# Patient Record
Sex: Female | Born: 1935 | Race: White | Hispanic: No | Smoking: Former smoker
Health system: Southern US, Community
[De-identification: ages and names within clinical notes are randomized; demographics above are authoritative.]

## PROBLEM LIST (undated history)

## (undated) DIAGNOSIS — I10 Essential (primary) hypertension: Secondary | ICD-10-CM

## (undated) DIAGNOSIS — F419 Anxiety disorder, unspecified: Secondary | ICD-10-CM

## (undated) DIAGNOSIS — K219 Gastro-esophageal reflux disease without esophagitis: Secondary | ICD-10-CM

## (undated) DIAGNOSIS — M1990 Unspecified osteoarthritis, unspecified site: Secondary | ICD-10-CM

## (undated) DIAGNOSIS — K529 Noninfective gastroenteritis and colitis, unspecified: Secondary | ICD-10-CM

## (undated) DIAGNOSIS — N390 Urinary tract infection, site not specified: Secondary | ICD-10-CM

## (undated) DIAGNOSIS — R51 Headache: Secondary | ICD-10-CM

## (undated) DIAGNOSIS — T8859XA Other complications of anesthesia, initial encounter: Secondary | ICD-10-CM

## (undated) DIAGNOSIS — T4145XA Adverse effect of unspecified anesthetic, initial encounter: Secondary | ICD-10-CM

## (undated) HISTORY — PX: LASIK: SHX215

## (undated) HISTORY — PX: CATARACT EXTRACTION, BILATERAL: SHX1313

---

## 2001-01-31 ENCOUNTER — Emergency Department (HOSPITAL_COMMUNITY): Admission: EM | Admit: 2001-01-31 | Discharge: 2001-01-31 | Payer: Self-pay | Admitting: Emergency Medicine

## 2001-03-27 ENCOUNTER — Ambulatory Visit (HOSPITAL_COMMUNITY): Admission: RE | Admit: 2001-03-27 | Discharge: 2001-03-27 | Payer: Self-pay | Admitting: Gastroenterology

## 2001-10-20 ENCOUNTER — Other Ambulatory Visit: Admission: RE | Admit: 2001-10-20 | Discharge: 2001-10-20 | Payer: Self-pay | Admitting: Internal Medicine

## 2001-11-24 ENCOUNTER — Encounter: Admission: RE | Admit: 2001-11-24 | Discharge: 2001-11-24 | Payer: Self-pay | Admitting: Internal Medicine

## 2001-11-24 ENCOUNTER — Encounter: Payer: Self-pay | Admitting: Internal Medicine

## 2001-11-28 ENCOUNTER — Encounter: Payer: Self-pay | Admitting: Internal Medicine

## 2001-11-28 ENCOUNTER — Encounter: Admission: RE | Admit: 2001-11-28 | Discharge: 2001-11-28 | Payer: Self-pay | Admitting: Internal Medicine

## 2003-12-06 ENCOUNTER — Encounter: Admission: RE | Admit: 2003-12-06 | Discharge: 2003-12-06 | Payer: Self-pay | Admitting: Family Medicine

## 2005-01-26 ENCOUNTER — Encounter: Admission: RE | Admit: 2005-01-26 | Discharge: 2005-01-26 | Payer: Self-pay | Admitting: Internal Medicine

## 2006-03-19 ENCOUNTER — Encounter: Admission: RE | Admit: 2006-03-19 | Discharge: 2006-03-19 | Payer: Self-pay | Admitting: Internal Medicine

## 2007-04-09 ENCOUNTER — Encounter: Admission: RE | Admit: 2007-04-09 | Discharge: 2007-04-09 | Payer: Self-pay | Admitting: Internal Medicine

## 2009-02-02 ENCOUNTER — Encounter: Admission: RE | Admit: 2009-02-02 | Discharge: 2009-02-02 | Payer: Self-pay | Admitting: Family Medicine

## 2010-02-20 ENCOUNTER — Encounter: Admission: RE | Admit: 2010-02-20 | Discharge: 2010-02-20 | Payer: Self-pay | Admitting: Family Medicine

## 2010-08-01 ENCOUNTER — Other Ambulatory Visit: Admission: RE | Admit: 2010-08-01 | Discharge: 2010-08-01 | Payer: Self-pay | Admitting: Family Medicine

## 2011-03-06 ENCOUNTER — Other Ambulatory Visit: Payer: Self-pay | Admitting: Family Medicine

## 2011-03-06 DIAGNOSIS — Z1231 Encounter for screening mammogram for malignant neoplasm of breast: Secondary | ICD-10-CM

## 2011-03-08 ENCOUNTER — Ambulatory Visit
Admission: RE | Admit: 2011-03-08 | Discharge: 2011-03-08 | Disposition: A | Discharge status: Home / Self Care | Payer: Medicare Other | Source: Ambulatory Visit | Attending: Family Medicine | Admitting: Family Medicine

## 2011-03-08 DIAGNOSIS — Z1231 Encounter for screening mammogram for malignant neoplasm of breast: Secondary | ICD-10-CM

## 2011-04-13 NOTE — Procedures (Signed)
[place]  Patient:    Carol [name], Carol [name]                    MRN: [id number] Proc. Date: 03/27/01 Adm. Date:  [id number] Disc. Date: [id number] Attending:  Charna Elizabeth CC:         Kern Reap, M.D.   Procedure Report  DATE OF BIRTH:  [birth year].  PROCEDURE:  Colonoscopy with biopsies.  ENDOSCOPIST:  Anselmo Rod, M.D.  INSTRUMENT USED:  Olympus video colonoscope.  INDICATION FOR PROCEDURE:  [name] 75 year old white female with [name] history of rectal bleeding and [name] family history of colon cancer in [name] sister.  Rule out colonic polyps, masses, hemorrhoids, etc.  PREPROCEDURE PREPARATION:  Informed consent was procured from the patient. The patient was fasted for eight hours prior to the procedure and prepped with [name] bottle of magnesium citrate and [name] gallon of NuLytely the night prior to the procedure.  PREPROCEDURE PHYSICAL:  VITAL SIGNS:  The patient had stable vital signs.  NECK:  Supple.  CHEST:  Clear to auscultation.  S1, S2 regular.  ABDOMEN:  Soft with normal abdominal bowel sounds.  DESCRIPTION OF PROCEDURE:  The patient was placed in the left lateral decubitus position and sedated with 70 mg of Demerol and 6 mg of Versed intravenously.  Once the patient was adequately sedate and maintained on low-flow oxygen and continuous cardiac monitoring, the Olympus video colonoscope was advanced from the rectum to the cecum without difficulty. There were [name] few scattered diverticula throughout the colon.  [name] small sessile polyp was biopsied from the mid-right colon by regular biopsy forceps.  Small external hemorrhoid was appreciated on anal inspection.  No other abnormalities were noted.  The appendiceal orifice and the ileocecal valve were clearly visualized and appeared healthy.  IMPRESSION: 1. Small external hemorrhoid. 2. Few scattered diverticula with more prominent diverticulosis in the right    colon. 3. Small polyp  biopsied from mid-right colon.  RECOMMENDATIONS: 1. Await pathology results. 2. Repeat colorectal cancer screening in the next five years. 3. Increase fluid and fiber in the diet. 4. Outpatient follow-up on [name] p.r.n. basis. DD:  03/27/01 TD:  03/29/01 Job: [id number]

## 2011-12-05 DIAGNOSIS — H269 Unspecified cataract: Secondary | ICD-10-CM | POA: Diagnosis not present

## 2011-12-05 DIAGNOSIS — IMO0002 Reserved for concepts with insufficient information to code with codable children: Secondary | ICD-10-CM | POA: Diagnosis not present

## 2011-12-05 DIAGNOSIS — H2510 Age-related nuclear cataract, unspecified eye: Secondary | ICD-10-CM | POA: Diagnosis not present

## 2012-01-30 DIAGNOSIS — M949 Disorder of cartilage, unspecified: Secondary | ICD-10-CM | POA: Diagnosis not present

## 2012-01-30 DIAGNOSIS — K219 Gastro-esophageal reflux disease without esophagitis: Secondary | ICD-10-CM | POA: Diagnosis not present

## 2012-01-30 DIAGNOSIS — E785 Hyperlipidemia, unspecified: Secondary | ICD-10-CM | POA: Diagnosis not present

## 2012-01-30 DIAGNOSIS — F411 Generalized anxiety disorder: Secondary | ICD-10-CM | POA: Diagnosis not present

## 2012-01-30 DIAGNOSIS — I10 Essential (primary) hypertension: Secondary | ICD-10-CM | POA: Diagnosis not present

## 2012-01-30 DIAGNOSIS — M899 Disorder of bone, unspecified: Secondary | ICD-10-CM | POA: Diagnosis not present

## 2012-01-30 DIAGNOSIS — F329 Major depressive disorder, single episode, unspecified: Secondary | ICD-10-CM | POA: Diagnosis not present

## 2012-02-13 ENCOUNTER — Other Ambulatory Visit: Payer: Self-pay | Admitting: Family Medicine

## 2012-02-13 DIAGNOSIS — Z1231 Encounter for screening mammogram for malignant neoplasm of breast: Secondary | ICD-10-CM

## 2012-03-10 ENCOUNTER — Ambulatory Visit
Admission: RE | Admit: 2012-03-10 | Discharge: 2012-03-10 | Disposition: A | Discharge status: Home / Self Care | Payer: Medicare Other | Source: Ambulatory Visit | Attending: Family Medicine | Admitting: Family Medicine

## 2012-03-10 DIAGNOSIS — Z1231 Encounter for screening mammogram for malignant neoplasm of breast: Secondary | ICD-10-CM | POA: Diagnosis not present

## 2012-08-06 DIAGNOSIS — F411 Generalized anxiety disorder: Secondary | ICD-10-CM | POA: Diagnosis not present

## 2012-08-06 DIAGNOSIS — M899 Disorder of bone, unspecified: Secondary | ICD-10-CM | POA: Diagnosis not present

## 2012-08-06 DIAGNOSIS — M949 Disorder of cartilage, unspecified: Secondary | ICD-10-CM | POA: Diagnosis not present

## 2012-08-06 DIAGNOSIS — Z23 Encounter for immunization: Secondary | ICD-10-CM | POA: Diagnosis not present

## 2012-08-06 DIAGNOSIS — E785 Hyperlipidemia, unspecified: Secondary | ICD-10-CM | POA: Diagnosis not present

## 2012-08-06 DIAGNOSIS — K219 Gastro-esophageal reflux disease without esophagitis: Secondary | ICD-10-CM | POA: Diagnosis not present

## 2012-08-06 DIAGNOSIS — F329 Major depressive disorder, single episode, unspecified: Secondary | ICD-10-CM | POA: Diagnosis not present

## 2012-08-06 DIAGNOSIS — I10 Essential (primary) hypertension: Secondary | ICD-10-CM | POA: Diagnosis not present

## 2012-09-29 DIAGNOSIS — M949 Disorder of cartilage, unspecified: Secondary | ICD-10-CM | POA: Diagnosis not present

## 2012-09-29 DIAGNOSIS — M899 Disorder of bone, unspecified: Secondary | ICD-10-CM | POA: Diagnosis not present

## 2012-11-07 DIAGNOSIS — H43819 Vitreous degeneration, unspecified eye: Secondary | ICD-10-CM | POA: Diagnosis not present

## 2012-11-07 DIAGNOSIS — Z961 Presence of intraocular lens: Secondary | ICD-10-CM | POA: Diagnosis not present

## 2012-11-07 DIAGNOSIS — H5200 Hypermetropia, unspecified eye: Secondary | ICD-10-CM | POA: Diagnosis not present

## 2012-11-07 DIAGNOSIS — H52229 Regular astigmatism, unspecified eye: Secondary | ICD-10-CM | POA: Diagnosis not present

## 2013-02-04 DIAGNOSIS — I10 Essential (primary) hypertension: Secondary | ICD-10-CM | POA: Diagnosis not present

## 2013-02-04 DIAGNOSIS — K219 Gastro-esophageal reflux disease without esophagitis: Secondary | ICD-10-CM | POA: Diagnosis not present

## 2013-02-04 DIAGNOSIS — K5732 Diverticulitis of large intestine without perforation or abscess without bleeding: Secondary | ICD-10-CM | POA: Diagnosis not present

## 2013-02-04 DIAGNOSIS — E785 Hyperlipidemia, unspecified: Secondary | ICD-10-CM | POA: Diagnosis not present

## 2013-02-04 DIAGNOSIS — F411 Generalized anxiety disorder: Secondary | ICD-10-CM | POA: Diagnosis not present

## 2013-02-11 DIAGNOSIS — R1032 Left lower quadrant pain: Secondary | ICD-10-CM | POA: Diagnosis not present

## 2013-03-18 ENCOUNTER — Other Ambulatory Visit: Payer: Self-pay

## 2013-03-18 DIAGNOSIS — Z1231 Encounter for screening mammogram for malignant neoplasm of breast: Secondary | ICD-10-CM

## 2013-04-16 ENCOUNTER — Ambulatory Visit
Admission: RE | Admit: 2013-04-16 | Discharge: 2013-04-16 | Disposition: A | Discharge status: Home / Self Care | Payer: Medicare Other | Source: Ambulatory Visit

## 2013-04-16 DIAGNOSIS — Z1231 Encounter for screening mammogram for malignant neoplasm of breast: Secondary | ICD-10-CM | POA: Diagnosis not present

## 2013-04-22 DIAGNOSIS — Z800 Family history of malignant neoplasm of digestive organs: Secondary | ICD-10-CM | POA: Diagnosis not present

## 2013-04-22 DIAGNOSIS — K5730 Diverticulosis of large intestine without perforation or abscess without bleeding: Secondary | ICD-10-CM | POA: Diagnosis not present

## 2013-04-22 DIAGNOSIS — Z538 Procedure and treatment not carried out for other reasons: Secondary | ICD-10-CM | POA: Diagnosis not present

## 2013-04-22 DIAGNOSIS — Z09 Encounter for follow-up examination after completed treatment for conditions other than malignant neoplasm: Secondary | ICD-10-CM | POA: Diagnosis not present

## 2013-04-22 DIAGNOSIS — Z86010 Personal history of colonic polyps: Secondary | ICD-10-CM | POA: Diagnosis not present

## 2013-05-12 ENCOUNTER — Other Ambulatory Visit: Payer: Self-pay | Admitting: Gastroenterology

## 2013-05-12 DIAGNOSIS — K5730 Diverticulosis of large intestine without perforation or abscess without bleeding: Secondary | ICD-10-CM

## 2013-05-22 ENCOUNTER — Ambulatory Visit
Admission: RE | Admit: 2013-05-22 | Discharge: 2013-05-22 | Disposition: A | Discharge status: Home / Self Care | Payer: Medicare Other | Source: Ambulatory Visit | Attending: Gastroenterology | Admitting: Gastroenterology

## 2013-05-22 DIAGNOSIS — K5730 Diverticulosis of large intestine without perforation or abscess without bleeding: Secondary | ICD-10-CM

## 2013-05-22 DIAGNOSIS — D126 Benign neoplasm of colon, unspecified: Secondary | ICD-10-CM | POA: Diagnosis not present

## 2013-06-29 DIAGNOSIS — Z800 Family history of malignant neoplasm of digestive organs: Secondary | ICD-10-CM | POA: Diagnosis not present

## 2013-06-29 DIAGNOSIS — D126 Benign neoplasm of colon, unspecified: Secondary | ICD-10-CM | POA: Diagnosis not present

## 2013-06-29 DIAGNOSIS — R933 Abnormal findings on diagnostic imaging of other parts of digestive tract: Secondary | ICD-10-CM | POA: Diagnosis not present

## 2013-07-02 ENCOUNTER — Encounter (HOSPITAL_COMMUNITY): Payer: Self-pay | Admitting: *Deleted

## 2013-07-03 ENCOUNTER — Encounter (HOSPITAL_COMMUNITY): Payer: Self-pay | Admitting: Pharmacy Technician

## 2013-07-05 ENCOUNTER — Ambulatory Visit (INDEPENDENT_AMBULATORY_CARE_PROVIDER_SITE_OTHER): Payer: Medicare Other | Admitting: Emergency Medicine

## 2013-07-05 VITALS — BP 142/70 | HR 81 | Temp 97.8°F | Resp 17 | Ht 61.5 in | Wt 135.2 lb

## 2013-07-05 DIAGNOSIS — N3000 Acute cystitis without hematuria: Secondary | ICD-10-CM | POA: Diagnosis not present

## 2013-07-05 DIAGNOSIS — R319 Hematuria, unspecified: Secondary | ICD-10-CM

## 2013-07-05 LAB — POCT URINALYSIS DIPSTICK
Glucose, UA: NEGATIVE
Nitrite, UA: NEGATIVE
Spec Grav, UA: <=1.005
Urobilinogen, UA: 0.2

## 2013-07-05 LAB — POCT UA - MICROSCOPIC ONLY: Casts, Ur, LPF, POC: NEGATIVE

## 2013-07-05 MED ORDER — PHENAZOPYRIDINE HCL 200 MG PO TABS: 200.0000 mg | ORAL_TABLET | Freq: Three times a day (TID) | ORAL | Status: DC | PRN

## 2013-07-05 MED ORDER — CIPROFLOXACIN HCL 500 MG PO TABS: 500.0000 mg | ORAL_TABLET | Freq: Two times a day (BID) | ORAL | Status: DC

## 2013-07-05 NOTE — Progress Notes (Deleted)
[facility] [address], [place] [address] [phone]  Date:  07/05/2013   Name:  Carol [name]   DOB:  [birth year]   MRN:  [id number]  PCP:  Johny Blamer, MD    Chief Complaint: Possible uti   History of Present Illness:  Carol [name] is a 77 y.o. very pleasant female patient who presents with the following:  Has dysuria and frequency and back pain.  No nausea or vomiting.  No stool change.  No fever or chills.  No GI or GYN symptoms.  No improvement with over the counter medications or other home remedies. Denies other complaint or health concern today.   There are no active problems to display for this patient.   Past Medical History  Diagnosis Date  . Hypertension   . Complication of anesthesia     B/P dropped post colonoscopy x1  . Anxiety   . GERD (gastroesophageal reflux disease)   . Headache(784.0)     migraines rare occ.  . Arthritis     arthritis -hips    Past Surgical History  Procedure Laterality Date  . Cataract extraction, bilateral Bilateral   . Lasik      History  Substance Use Topics  . Smoking status: Current Every Day Smoker -- 1.00 packs/day for 40 years    Types: Cigarettes  . Smokeless tobacco: Not on file  . Alcohol Use: No    No family history on file.  Allergies  Allergen Reactions  . Sulfa Antibiotics     jittery    Medication list has been reviewed and updated.  Current Outpatient Prescriptions on File Prior to Visit  Medication Sig Dispense Refill  . acetaminophen (TYLENOL) 500 MG tablet Take 1,000 mg by mouth every 6 (six) hours as needed for pain.      Marland Kitchen alendronate (FOSAMAX) 70 MG tablet Take 70 mg by mouth every 7 (seven) days. Take with a full glass of water on an empty stomach.      . ALPRAZolam (XANAX) 0.25 MG tablet Take 0.25 mg by mouth 3 (three) times daily as needed for anxiety.      Marland Kitchen amLODipine-benazepril (LOTREL) 5-20 MG per capsule Take 1 capsule by mouth every morning.       . Calcium Carbonate-Vitamin D (CALTRATE 600+D PO) Take 1 tablet by mouth daily.      . citalopram (CELEXA) 20 MG tablet Take 20 mg by mouth every evening.      . Multiple Vitamin (MULTIVITAMIN WITH MINERALS) TABS tablet Take 1 tablet by mouth daily.      . Probiotic Product (ALIGN) 4 MG CAPS Take 1 capsule by mouth daily.      . ranitidine (ZANTAC) 300 MG capsule Take 300 mg by mouth 2 (two) times daily.      . simvastatin (ZOCOR) 20 MG tablet Take 20 mg by mouth every evening.       No current facility-administered medications on file prior to visit.    Review of Systems:  As per HPI, otherwise negative.    Physical Examination: Filed Vitals:   07/05/13 1015  BP: 142/70  Pulse: 81  Temp: 97.8 F (36.6 C)  Resp: 17   Filed Vitals:   07/05/13 1015  Height: 5' 1.5" (1.562 m)  Weight: 135 lb 3.2 oz (61.326 kg)   Body mass index is 25.14 kg/(m^2). Ideal Body Weight: Weight in (lb) to have BMI = 25: 134.2   GEN: WDWN, NAD, Non-toxic,  Alert & Oriented x 3 HEENT: Atraumatic, Normocephalic.  Ears and Nose: No external deformity. EXTR: No clubbing/cyanosis/edema NEURO: Normal gait.  PSYCH: Normally interactive. Conversant. Not depressed or anxious appearing.  Calm demeanor.  BACK:  Not tender over CVA ABD:  Benign.    Assessment and Plan: Cystitis Septra Pyridium   Signed,  Phillips Odor, MD   Results for orders placed in visit on 07/05/13  POCT UA - MICROSCOPIC ONLY      Result Value Range   WBC, Ur, HPF, POC tntc     RBC, urine, microscopic 1-10     Bacteria, U Microscopic 1+     Mucus, UA small     Epithelial cells, urine per micros 0-5     Crystals, Ur, HPF, POC neg     Casts, Ur, LPF, POC neg     Yeast, UA neg    POCT URINALYSIS DIPSTICK      Result Value Range   Color, UA yellow     Clarity, UA cloudy     Glucose, UA neg     Bilirubin, UA neg     Ketones, UA neg     Spec Grav, UA <=1.005     Blood, UA large     pH, UA 6.0     Protein, UA 100      Urobilinogen, UA 0.2     Nitrite, UA neg     Leukocytes, UA large (3+)

## 2013-07-05 NOTE — Progress Notes (Signed)
[facility] [address], [place] [address] [phone]  Date:  07/05/2013   Name:  Carol [name]   DOB:  30-Nov-1935   MRN:  [id number]  PCP:  Johny Blamer, MD    Chief Complaint: Possible uti   History of Present Illness:  Carol [name] is a 77 y.o. very pleasant female patient who presents with the following:  Ill since Friday with dysuria and hematuria.  No fever or chills, back pain, nausea or vomiting. No vaginal discharge or bleeding.  No improvement with over the counter medications or other home remedies. Denies other complaint or health concern today.   There are no active problems to display for this patient.   Past Medical History  Diagnosis Date  . Hypertension   . Complication of anesthesia     B/P dropped post colonoscopy x1  . Anxiety   . GERD (gastroesophageal reflux disease)   . Headache(784.0)     migraines rare occ.  . Arthritis     arthritis -hips    Past Surgical History  Procedure Laterality Date  . Cataract extraction, bilateral Bilateral   . Lasik      History  Substance Use Topics  . Smoking status: Current Every Day Smoker -- 1.00 packs/day for 40 years    Types: Cigarettes  . Smokeless tobacco: Not on file  . Alcohol Use: No    No family history on file.  Allergies  Allergen Reactions  . Sulfa Antibiotics     jittery    Medication list has been reviewed and updated.  Current Outpatient Prescriptions on File Prior to Visit  Medication Sig Dispense Refill  . acetaminophen (TYLENOL) 500 MG tablet Take 1,000 mg by mouth every 6 (six) hours as needed for pain.      Marland Kitchen alendronate (FOSAMAX) 70 MG tablet Take 70 mg by mouth every 7 (seven) days. Take with a full glass of water on an empty stomach.      . ALPRAZolam (XANAX) 0.25 MG tablet Take 0.25 mg by mouth 3 (three) times daily as needed for anxiety.      Marland Kitchen amLODipine-benazepril (LOTREL) 5-20 MG per capsule Take 1 capsule by mouth every morning.       . Calcium Carbonate-Vitamin D (CALTRATE 600+D PO) Take 1 tablet by mouth daily.      . citalopram (CELEXA) 20 MG tablet Take 20 mg by mouth every evening.      . Multiple Vitamin (MULTIVITAMIN WITH MINERALS) TABS tablet Take 1 tablet by mouth daily.      . Probiotic Product (ALIGN) 4 MG CAPS Take 1 capsule by mouth daily.      . ranitidine (ZANTAC) 300 MG capsule Take 300 mg by mouth 2 (two) times daily.      . simvastatin (ZOCOR) 20 MG tablet Take 20 mg by mouth every evening.       No current facility-administered medications on file prior to visit.    Review of Systems:  As per HPI, otherwise negative.    Physical Examination: Filed Vitals:   07/05/13 1015  BP: 142/70  Pulse: 81  Temp: 97.8 F (36.6 C)  Resp: 17   Filed Vitals:   07/05/13 1015  Height: 5' 1.5" (1.562 m)  Weight: 135 lb 3.2 oz (61.326 kg)   Body mass index is 25.14 kg/(m^2). Ideal Body Weight: Weight in (lb) to have BMI = 25: 134.2   GEN: WDWN, NAD, Non-toxic, Alert & Oriented x 3  HEENT: Atraumatic, Normocephalic.  Ears and Nose: No external deformity. EXTR: No clubbing/cyanosis/edema NEURO: Normal gait.  PSYCH: Normally interactive. Conversant. Not depressed or anxious appearing.  Calm demeanor.  BACK No CVA tender ABD:  Benign   Assessment and Plan: Cystitis  Signed,  Phillips Odor, MD  Results for orders placed in visit on 07/05/13  POCT UA - MICROSCOPIC ONLY      Result Value Range   WBC, Ur, HPF, POC tntc     RBC, urine, microscopic 1-10     Bacteria, U Microscopic 1+     Mucus, UA small     Epithelial cells, urine per micros 0-5     Crystals, Ur, HPF, POC neg     Casts, Ur, LPF, POC neg     Yeast, UA neg    POCT URINALYSIS DIPSTICK      Result Value Range   Color, UA yellow     Clarity, UA cloudy     Glucose, UA neg     Bilirubin, UA neg     Ketones, UA neg     Spec Grav, UA <=1.005     Blood, UA large     pH, UA 6.0     Protein, UA 100     Urobilinogen, UA 0.2      Nitrite, UA neg     Leukocytes, UA large (3+)

## 2013-07-05 NOTE — Patient Instructions (Addendum)
Urinary Tract Infection  Urinary tract infections (UTIs) can develop anywhere along your urinary tract. Your urinary tract is your body's drainage system for removing wastes and extra water. Your urinary tract includes two kidneys, two ureters, a bladder, and a urethra. Your kidneys are a pair of bean-shaped organs. Each kidney is about the size of your fist. They are located below your ribs, one on each side of your spine.  CAUSES  Infections are caused by microbes, which are microscopic organisms, including fungi, viruses, and bacteria. These organisms are so small that they can only be seen through a microscope. Bacteria are the microbes that most commonly cause UTIs.  SYMPTOMS   Symptoms of UTIs may vary by age and gender of the patient and by the location of the infection. Symptoms in young women typically include a frequent and intense urge to urinate and a painful, burning feeling in the bladder or urethra during urination. Older women and men are more likely to be tired, shaky, and weak and have muscle aches and abdominal pain. A fever may mean the infection is in your kidneys. Other symptoms of a kidney infection include pain in your back or sides below the ribs, nausea, and vomiting.  DIAGNOSIS  To diagnose a UTI, your caregiver will ask you about your symptoms. Your caregiver also will ask to provide a urine sample. The urine sample will be tested for bacteria and white blood cells. White blood cells are made by your body to help fight infection.  TREATMENT   Typically, UTIs can be treated with medication. Because most UTIs are caused by a bacterial infection, they usually can be treated with the use of antibiotics. The choice of antibiotic and length of treatment depend on your symptoms and the type of bacteria causing your infection.  HOME CARE INSTRUCTIONS   If you were prescribed antibiotics, take them exactly as your caregiver instructs you. Finish the medication even if you feel better after you  have only taken some of the medication.   Drink enough water and fluids to keep your urine clear or pale yellow.   Avoid caffeine, tea, and carbonated beverages. They tend to irritate your bladder.   Empty your bladder often. Avoid holding urine for long periods of time.   Empty your bladder before and after sexual intercourse.   After a bowel movement, women should cleanse from front to back. Use each tissue only once.  SEEK MEDICAL CARE IF:    You have back pain.   You develop a fever.   Your symptoms do not begin to resolve within 3 days.  SEEK IMMEDIATE MEDICAL CARE IF:    You have severe back pain or lower abdominal pain.   You develop chills.   You have nausea or vomiting.   You have continued burning or discomfort with urination.  MAKE SURE YOU:    Understand these instructions.   Will watch your condition.   Will get help right away if you are not doing well or get worse.  Document Released: 08/22/2005 Document Revised: 05/13/2012 Document Reviewed: 12/21/2011  [employer] Patient Information 2014 [employer]

## 2013-07-06 ENCOUNTER — Encounter (HOSPITAL_COMMUNITY)
Admission: RE | Admit: 2013-07-06 | Discharge: 2013-07-06 | Disposition: A | Discharge status: Home / Self Care | Payer: Medicare Other | Source: Ambulatory Visit | Attending: Orthopedic Surgery | Admitting: Orthopedic Surgery

## 2013-07-06 DIAGNOSIS — Z01818 Encounter for other preprocedural examination: Secondary | ICD-10-CM | POA: Diagnosis not present

## 2013-07-06 DIAGNOSIS — Z01810 Encounter for preprocedural cardiovascular examination: Secondary | ICD-10-CM | POA: Insufficient documentation

## 2013-07-06 DIAGNOSIS — Z01812 Encounter for preprocedural laboratory examination: Secondary | ICD-10-CM | POA: Diagnosis not present

## 2013-07-06 LAB — BASIC METABOLIC PANEL
Calcium: 9.3 mg/dL (ref 8.4–10.5)
GFR calc Af Amer: >90 mL/min (ref >90)
GFR calc non Af Amer: 82 mL/min — ABNORMAL LOW (ref >90)
Glucose, Bld: 102 mg/dL — ABNORMAL HIGH (ref 70–99)
Potassium: 4.1 mEq/L (ref 3.5–5.1)
Sodium: 134 mEq/L — ABNORMAL LOW (ref 135–145)

## 2013-07-13 DIAGNOSIS — N390 Urinary tract infection, site not specified: Secondary | ICD-10-CM | POA: Diagnosis not present

## 2013-07-14 ENCOUNTER — Ambulatory Visit (HOSPITAL_COMMUNITY): Admission: RE | Admit: 2013-07-14 | Payer: Medicare Other | Source: Ambulatory Visit | Admitting: Gastroenterology

## 2013-07-14 HISTORY — DX: Anxiety disorder, unspecified: F41.9

## 2013-07-14 HISTORY — DX: Essential (primary) hypertension: I10

## 2013-07-14 HISTORY — DX: Gastro-esophageal reflux disease without esophagitis: K21.9

## 2013-07-14 HISTORY — DX: Unspecified osteoarthritis, unspecified site: M19.90

## 2013-07-14 HISTORY — DX: Other complications of anesthesia, initial encounter: T88.59XA

## 2013-07-14 HISTORY — DX: Headache: R51

## 2013-07-14 HISTORY — DX: Adverse effect of unspecified anesthetic, initial encounter: T41.45XA

## 2013-07-14 SURGERY — COLONOSCOPY
Anesthesia: Monitor Anesthesia Care

## 2013-07-22 ENCOUNTER — Encounter (HOSPITAL_COMMUNITY): Payer: Self-pay | Admitting: *Deleted

## 2013-07-24 ENCOUNTER — Encounter (HOSPITAL_COMMUNITY): Payer: Self-pay | Admitting: Pharmacy Technician

## 2013-08-03 ENCOUNTER — Encounter (HOSPITAL_COMMUNITY): Payer: Self-pay | Admitting: *Deleted

## 2013-08-03 ENCOUNTER — Encounter (HOSPITAL_COMMUNITY)
Admission: RE | Disposition: A | Discharge status: Home / Self Care | Payer: Self-pay | Source: Ambulatory Visit | Attending: Gastroenterology

## 2013-08-03 ENCOUNTER — Ambulatory Visit (HOSPITAL_COMMUNITY)
Admission: RE | Admit: 2013-08-03 | Discharge: 2013-08-03 | Disposition: A | Discharge status: Home / Self Care | Payer: Medicare Other | Source: Ambulatory Visit | Attending: Gastroenterology | Admitting: Gastroenterology

## 2013-08-03 ENCOUNTER — Ambulatory Visit (HOSPITAL_COMMUNITY): Payer: Medicare Other | Admitting: *Deleted

## 2013-08-03 ENCOUNTER — Encounter (HOSPITAL_COMMUNITY): Payer: Self-pay

## 2013-08-03 ENCOUNTER — Ambulatory Visit (HOSPITAL_COMMUNITY): Admit: 2013-08-03 | Payer: Self-pay | Admitting: Gastroenterology

## 2013-08-03 ENCOUNTER — Other Ambulatory Visit: Payer: Self-pay | Admitting: Gastroenterology

## 2013-08-03 DIAGNOSIS — F17200 Nicotine dependence, unspecified, uncomplicated: Secondary | ICD-10-CM | POA: Insufficient documentation

## 2013-08-03 DIAGNOSIS — D126 Benign neoplasm of colon, unspecified: Secondary | ICD-10-CM | POA: Diagnosis not present

## 2013-08-03 DIAGNOSIS — K648 Other hemorrhoids: Secondary | ICD-10-CM | POA: Diagnosis not present

## 2013-08-03 DIAGNOSIS — Z09 Encounter for follow-up examination after completed treatment for conditions other than malignant neoplasm: Secondary | ICD-10-CM | POA: Insufficient documentation

## 2013-08-03 DIAGNOSIS — Z800 Family history of malignant neoplasm of digestive organs: Secondary | ICD-10-CM | POA: Insufficient documentation

## 2013-08-03 DIAGNOSIS — I10 Essential (primary) hypertension: Secondary | ICD-10-CM | POA: Insufficient documentation

## 2013-08-03 DIAGNOSIS — K5730 Diverticulosis of large intestine without perforation or abscess without bleeding: Secondary | ICD-10-CM | POA: Insufficient documentation

## 2013-08-03 DIAGNOSIS — Z79899 Other long term (current) drug therapy: Secondary | ICD-10-CM | POA: Diagnosis not present

## 2013-08-03 DIAGNOSIS — K219 Gastro-esophageal reflux disease without esophagitis: Secondary | ICD-10-CM | POA: Insufficient documentation

## 2013-08-03 HISTORY — PX: COLONOSCOPY WITH PROPOFOL: SHX5780

## 2013-08-03 HISTORY — DX: Urinary tract infection, site not specified: N39.0

## 2013-08-03 SURGERY — COLONOSCOPY WITH PROPOFOL
Anesthesia: Monitor Anesthesia Care

## 2013-08-03 SURGERY — COLONOSCOPY
Anesthesia: Moderate Sedation

## 2013-08-03 MED ORDER — PROPOFOL 10 MG/ML IV BOLUS
INTRAVENOUS | Status: DC | PRN
  Administered 2013-08-03: 50 mg via INTRAVENOUS

## 2013-08-03 MED ORDER — PHENYLEPHRINE HCL 10 MG/ML IJ SOLN
INTRAMUSCULAR | Status: DC | PRN
  Administered 2013-08-03: 40 ug via INTRAVENOUS

## 2013-08-03 MED ORDER — PROPOFOL INFUSION 10 MG/ML OPTIME
INTRAVENOUS | Status: DC | PRN
  Administered 2013-08-03: 200 ug/kg/min via INTRAVENOUS

## 2013-08-03 MED ORDER — EPHEDRINE SULFATE 50 MG/ML IJ SOLN
INTRAMUSCULAR | Status: DC | PRN
  Administered 2013-08-03: 5 mg via INTRAVENOUS
  Administered 2013-08-03: 10 mg via INTRAVENOUS
  Administered 2013-08-03 (×3): 5 mg via INTRAVENOUS

## 2013-08-03 MED ORDER — PROMETHAZINE HCL 25 MG/ML IJ SOLN: 6.2500 mg | INTRAMUSCULAR | Status: DC | PRN

## 2013-08-03 MED ORDER — LACTATED RINGERS IV SOLN
INTRAVENOUS | Status: DC | PRN
  Administered 2013-08-03: 12:00:00 via INTRAVENOUS

## 2013-08-03 MED ORDER — SODIUM CHLORIDE 0.9 % IV SOLN: INTRAVENOUS | Status: DC

## 2013-08-03 MED ORDER — KETAMINE HCL 10 MG/ML IJ SOLN
INTRAMUSCULAR | Status: DC | PRN
  Administered 2013-08-03: 15 mg via INTRAVENOUS
  Administered 2013-08-03: 10 mg via INTRAVENOUS

## 2013-08-03 SURGICAL SUPPLY — 21 items

## 2013-08-03 NOTE — Transfer of Care (Signed)
Immediate Anesthesia Transfer of Care Note  Patient: Carol [name]  Procedure(s) Performed: Procedure(s) with comments: COLONOSCOPY WITH PROPOFOL (N/A) - ultra thin colon scope  Patient Location: PACU, endo  Anesthesia Type:MAC  Level of Consciousness: sedated, spontaneously ventilating well  Airway & Oxygen Therapy: Patient Spontanous Breathing and Patient connected to face mask oxygen  Post-op Assessment: Report given to PACU RN, Post -op Vital signs reviewed and stable and Patient moving all extremities X 4  Post vital signs: Reviewed and stable  Complications: No apparent anesthesia complications

## 2013-08-03 NOTE — Anesthesia Postprocedure Evaluation (Signed)
  Anesthesia Post-op Note  Patient: Carol [name]  Procedure(s) Performed: Procedure(s) (LRB): COLONOSCOPY WITH PROPOFOL (N/A)  Patient Location: PACU  Anesthesia Type: MAC  Level of Consciousness: awake and alert   Airway and Oxygen Therapy: Patient Spontanous Breathing  Post-op Pain: mild  Post-op Assessment: Post-op Vital signs reviewed, Patient's Cardiovascular Status Stable, Respiratory Function Stable, Patent Airway and No signs of Nausea or vomiting  Last Vitals:  Filed Vitals:   08/03/13 1141  BP: 137/61  Temp: 36.8 C  Resp: 18    Post-op Vital Signs: stable   Complications: No apparent anesthesia complications

## 2013-08-03 NOTE — Op Note (Signed)
[facility] [address] [place], [address]   COLONOSCOPY PROCEDURE REPORT  PATIENT: [name], [name]  MR#: [id number] BIRTHDATE: 06/08/1936 , 76  yrs. old GENDER: Female ENDOSCOPIST: Carman Ching, MD REFERRED BY:   Dr. Leonides Sake PROCEDURE DATE:  08/03/2013 PROCEDURE:     Colonoscopy with Polypectomy ASA CLASS:    class 2 INDICATIONS:  patient has history of previous colonoscopies due to strong family history of colon cancer and previous history of adenomatous colon polyps. Her colonoscopies of always been difficult due to diverticulosis in very poor mobility of hercolon. Were unable to complete colonoscopy recently for these reasons and virtual colonoscopy showed some small polyps in the descending colon and some diverticular disease. MEDICATIONS: MAC. Propofol 600 mg  DESCRIPTION OF PROCEDURE:  Because for failure to complete the procedure with standard sedation and pediatric colonoscope, arrangements were made to use ultrathin Pentax enteroscope with propofol sedation. Pentax enteroscope was inserted following digital exam. There was poor mobility of the colon and moderate diverticulosis were able to pass this area in advance rapidly to the cecum. The appendiceal orifice and ileocecal valve were identified. The scope was withdrawn in the: carefully exam. To polyps were located in the descending colon and both were removed with the hot snare and recovered. They were labeled proximal and distal descending colon., The remainder of the colon was basically normal other than moderate diverticular disease. Internal hemorrhoids in the rectum in retroflex view. The scope was withdrawn in the patient tolerated procedure well. There were no immediate complications.     COMPLICATIONS: None  ENDOSCOPIC IMPRESSION: 1. Descending Colon polyps. These were removed. 2. Moderate diverticulosis and poor mobility of the colon requiring use of propofol and  ultrathin enterscope 3. History of previous colon polyps 4. Family history of colon cancer  RECOMMENDATIONS: 1.routine posts polypectomy instructions. We'll likely recommend repeating colonoscopy in 3 to 5 years 2. Would recommend ultrathin enteroscope and propofol sedation for future colonoscopies.    _______________________________ Rosalie DoctorCarman Ching, MD 08/03/2013 2:20 PM      PATIENT NAME:  [name], [name] MR#: [id number]

## 2013-08-03 NOTE — H&P (Signed)
  Subjective:   Patient is a 77 y.o. female presents with hist of colon polyps and severe diverticulosis. Unable to pass  Peds scope with regular sedation.  Procedure including risks and benefits discussed in office.  There are no active problems to display for this patient.  Past Medical History  Diagnosis Date  . Hypertension   . Complication of anesthesia     B/P dropped post colonoscopy x1  . Anxiety   . GERD (gastroesophageal reflux disease)   . Arthritis     arthritis -hips  . Headache(784.0)     migraines rare occ.  Marland Kitchen Urinary tract infection early aug 2014    Past Surgical History  Procedure Laterality Date  . Cataract extraction, bilateral Bilateral   . Lasik      Prescriptions prior to admission  Medication Sig Dispense Refill  . acetaminophen (TYLENOL) 500 MG tablet Take 1,000 mg by mouth every 6 (six) hours as needed for pain.      Marland Kitchen alendronate (FOSAMAX) 70 MG tablet Take 70 mg by mouth every 7 (seven) days. Take with a full glass of water on an empty stomach.      . ALPRAZolam (XANAX) 0.25 MG tablet Take 0.25 mg by mouth 3 (three) times daily as needed for anxiety.      Marland Kitchen amLODipine-benazepril (LOTREL) 5-20 MG per capsule Take 1 capsule by mouth every morning.      . Calcium Carbonate-Vitamin D (CALTRATE 600+D PO) Take 1 tablet by mouth daily.      . citalopram (CELEXA) 20 MG tablet Take 20 mg by mouth every evening.      . Multiple Vitamin (MULTIVITAMIN WITH MINERALS) TABS tablet Take 1 tablet by mouth daily.      . Probiotic Product (ALIGN) 4 MG CAPS Take 1 capsule by mouth daily.      . ranitidine (ZANTAC) 300 MG capsule Take 300 mg by mouth 2 (two) times daily.      . simvastatin (ZOCOR) 20 MG tablet Take 20 mg by mouth every evening.       Allergies  Allergen Reactions  . Ciprofloxacin Nausea Only    Nausea and legs weak  . Nitrofuran Derivatives Nausea Only    nitrofuratonin bad nausea, legs weak  . Sulfa Antibiotics     jittery    History  Substance  Use Topics  . Smoking status: Current Every Day Smoker -- 1.00 packs/day for 40 years    Types: Cigarettes  . Smokeless tobacco: Never Used  . Alcohol Use: No    History reviewed. No pertinent family history.   Objective:   Patient Vitals for the past 8 hrs:  BP Temp Temp src Resp SpO2 Height Weight  08/03/13 1141 137/61 mmHg 98.2 F (36.8 C) Oral 18 93 % 5' 1.5" (1.562 m) 59.875 kg (132 lb)         See MD Preop evaluation      Assessment:   1. History of colon polyps 2. Diverticulosis with stricture.  Plan:   Colonoscopy with ultrathin scope and MAC

## 2013-08-03 NOTE — Preoperative (Signed)
Beta Blockers   Reason not to administer Beta Blockers:Not Applicable, not on home BB 

## 2013-08-03 NOTE — Anesthesia Preprocedure Evaluation (Signed)
Anesthesia Evaluation  Patient identified by MRN, date of birth, ID band Patient awake    Reviewed: Allergy & Precautions, H&P , NPO status , Patient's Chart, lab work & pertinent test results  Airway Mallampati: II TM Distance: >3 FB Neck ROM: Full    Dental no notable dental hx.    Pulmonary Current Smoker,  breath sounds clear to auscultation  Pulmonary exam normal       Cardiovascular hypertension, Pt. on medications Rhythm:Regular Rate:Normal     Neuro/Psych negative neurological ROS  negative psych ROS   GI/Hepatic Neg liver ROS, GERD-  Medicated,  Endo/Other  negative endocrine ROS  Renal/GU negative Renal ROS  negative genitourinary   Musculoskeletal negative musculoskeletal ROS (+)   Abdominal   Peds negative pediatric ROS (+)  Hematology negative hematology ROS (+)   Anesthesia Other Findings   Reproductive/Obstetrics negative OB ROS                           Anesthesia Physical Anesthesia Plan  ASA: II  Anesthesia Plan: MAC   Post-op Pain Management:    Induction: Intravenous  Airway Management Planned: Simple Face Mask  Additional Equipment:   Intra-op Plan:   Post-operative Plan:   Informed Consent: I have reviewed the patients History and Physical, chart, labs and discussed the procedure including the risks, benefits and alternatives for the proposed anesthesia with the patient or authorized representative who has indicated his/her understanding and acceptance.     Plan Discussed with: CRNA and Surgeon  Anesthesia Plan Comments:         Anesthesia Quick Evaluation

## 2013-08-04 ENCOUNTER — Encounter (HOSPITAL_COMMUNITY): Payer: Self-pay | Admitting: Gastroenterology

## 2013-08-19 DIAGNOSIS — I10 Essential (primary) hypertension: Secondary | ICD-10-CM | POA: Diagnosis not present

## 2013-08-19 DIAGNOSIS — E785 Hyperlipidemia, unspecified: Secondary | ICD-10-CM | POA: Diagnosis not present

## 2013-08-19 DIAGNOSIS — F411 Generalized anxiety disorder: Secondary | ICD-10-CM | POA: Diagnosis not present

## 2013-08-19 DIAGNOSIS — Z23 Encounter for immunization: Secondary | ICD-10-CM | POA: Diagnosis not present

## 2013-08-19 DIAGNOSIS — J069 Acute upper respiratory infection, unspecified: Secondary | ICD-10-CM | POA: Diagnosis not present

## 2013-08-19 DIAGNOSIS — K219 Gastro-esophageal reflux disease without esophagitis: Secondary | ICD-10-CM | POA: Diagnosis not present

## 2013-11-12 NOTE — Addendum Note (Signed)
Addended by: Julyan Gales JR. on: 11/12/2013 08:09 AM   Modules accepted: Orders  

## 2014-02-15 DIAGNOSIS — F3289 Other specified depressive episodes: Secondary | ICD-10-CM | POA: Diagnosis not present

## 2014-02-15 DIAGNOSIS — F411 Generalized anxiety disorder: Secondary | ICD-10-CM | POA: Diagnosis not present

## 2014-02-15 DIAGNOSIS — M810 Age-related osteoporosis without current pathological fracture: Secondary | ICD-10-CM | POA: Diagnosis not present

## 2014-02-15 DIAGNOSIS — K219 Gastro-esophageal reflux disease without esophagitis: Secondary | ICD-10-CM | POA: Diagnosis not present

## 2014-02-15 DIAGNOSIS — F329 Major depressive disorder, single episode, unspecified: Secondary | ICD-10-CM | POA: Diagnosis not present

## 2014-02-15 DIAGNOSIS — E785 Hyperlipidemia, unspecified: Secondary | ICD-10-CM | POA: Diagnosis not present

## 2014-02-15 DIAGNOSIS — I10 Essential (primary) hypertension: Secondary | ICD-10-CM | POA: Diagnosis not present

## 2014-04-13 ENCOUNTER — Other Ambulatory Visit: Payer: Self-pay

## 2014-04-13 DIAGNOSIS — Z1231 Encounter for screening mammogram for malignant neoplasm of breast: Secondary | ICD-10-CM

## 2014-04-20 ENCOUNTER — Ambulatory Visit
Admission: RE | Admit: 2014-04-20 | Discharge: 2014-04-20 | Disposition: A | Discharge status: Home / Self Care | Payer: Medicare Other | Source: Ambulatory Visit

## 2014-04-20 ENCOUNTER — Encounter (INDEPENDENT_AMBULATORY_CARE_PROVIDER_SITE_OTHER): Payer: Self-pay

## 2014-04-20 DIAGNOSIS — Z1231 Encounter for screening mammogram for malignant neoplasm of breast: Secondary | ICD-10-CM

## 2014-08-18 DIAGNOSIS — J309 Allergic rhinitis, unspecified: Secondary | ICD-10-CM | POA: Diagnosis not present

## 2014-08-18 DIAGNOSIS — I10 Essential (primary) hypertension: Secondary | ICD-10-CM | POA: Diagnosis not present

## 2014-08-18 DIAGNOSIS — Z23 Encounter for immunization: Secondary | ICD-10-CM | POA: Diagnosis not present

## 2014-08-18 DIAGNOSIS — F411 Generalized anxiety disorder: Secondary | ICD-10-CM | POA: Diagnosis not present

## 2014-08-18 DIAGNOSIS — F329 Major depressive disorder, single episode, unspecified: Secondary | ICD-10-CM | POA: Diagnosis not present

## 2014-08-18 DIAGNOSIS — K219 Gastro-esophageal reflux disease without esophagitis: Secondary | ICD-10-CM | POA: Diagnosis not present

## 2014-08-18 DIAGNOSIS — M810 Age-related osteoporosis without current pathological fracture: Secondary | ICD-10-CM | POA: Diagnosis not present

## 2014-08-18 DIAGNOSIS — E785 Hyperlipidemia, unspecified: Secondary | ICD-10-CM | POA: Diagnosis not present

## 2014-08-18 DIAGNOSIS — F3289 Other specified depressive episodes: Secondary | ICD-10-CM | POA: Diagnosis not present

## 2014-09-12 IMAGING — CT CT VIRTUAL COLONOSCOPY DIAGNOSTIC
3 of 7 series · 12 of 36 positions shown, 18 images · non-contrast
Comparison: None.

CLINICAL DATA: History diverticulitis and polyps, strong family
history of colon carcinoma

CT VIRTUAL COLONOSCOPY DIAGNOSTIC
TECHNIQUE: The patient was given a standard magnesium citrate and
suppositories bowel preparation with Gastrografin and barium for
fluid and stool tagging respectively.  The quality of the bowel
preparation is excellent.  Automated CO2 insufflation of the colon
was performed prior to image acquisition and colonic distention is
moderate.  Image post processing was used to generate a 3D
endoluminal fly-through projection of the colon and to
electronically subtract stool/fluid as appropriate.

[Series 2: supine (id) · axial · 0.70mm/px · z∈[-389,-21]mm · 7 of 393 slices shown, 12 images]
[im 50/393  soft-tissue]
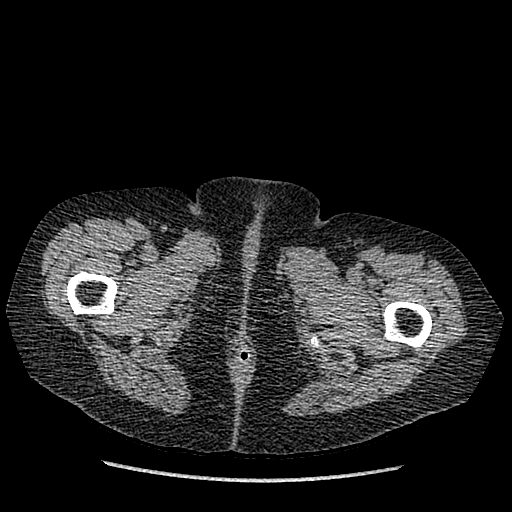
[im 50/393  bone]
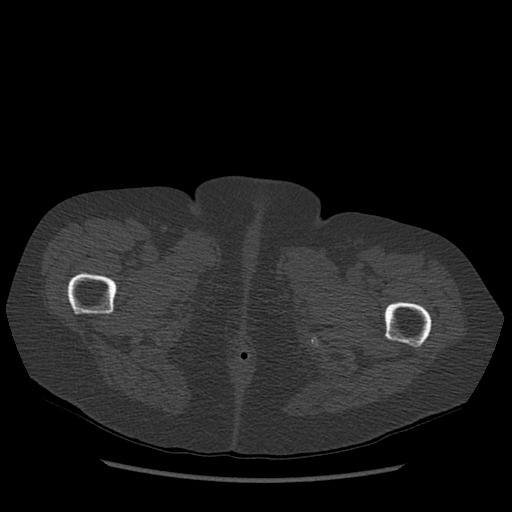
[im 99/393  soft-tissue]
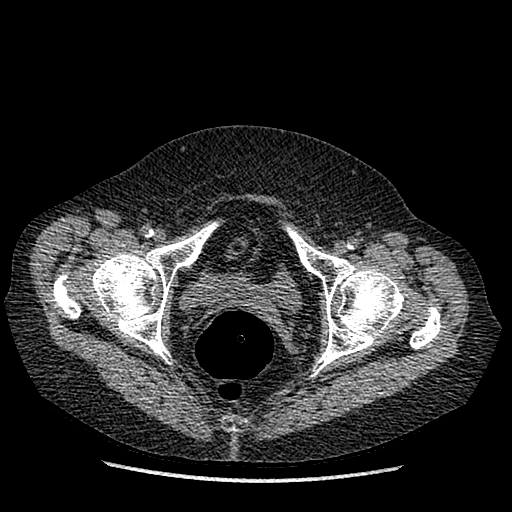
[im 148/393  soft-tissue]
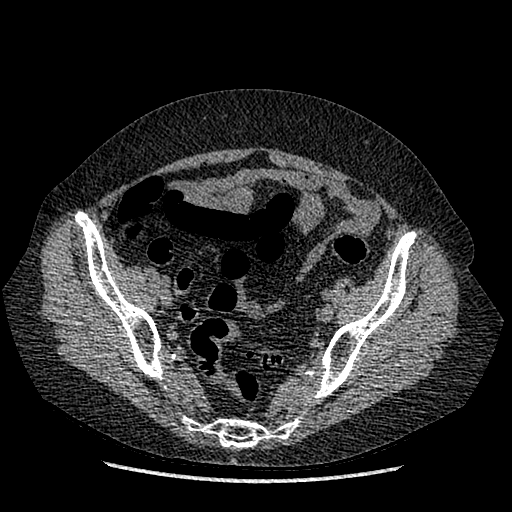
[im 197/393  soft-tissue]
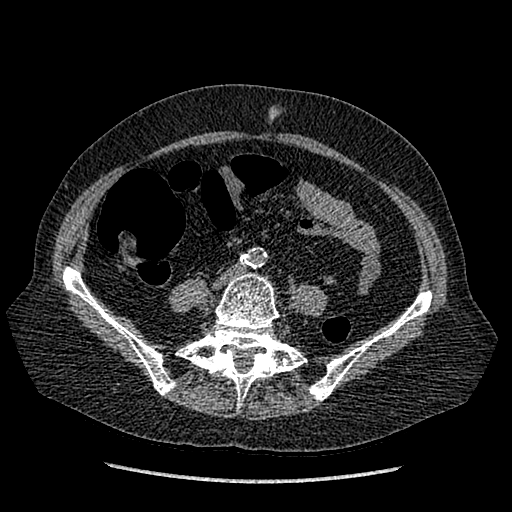
[im 197/393  lung]
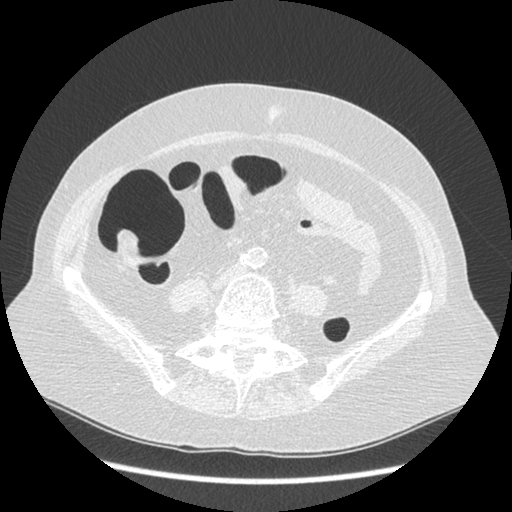
[im 246/393  soft-tissue]
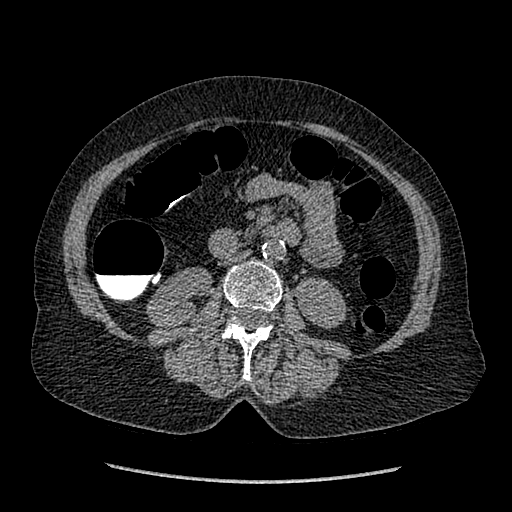
[im 246/393  lung]
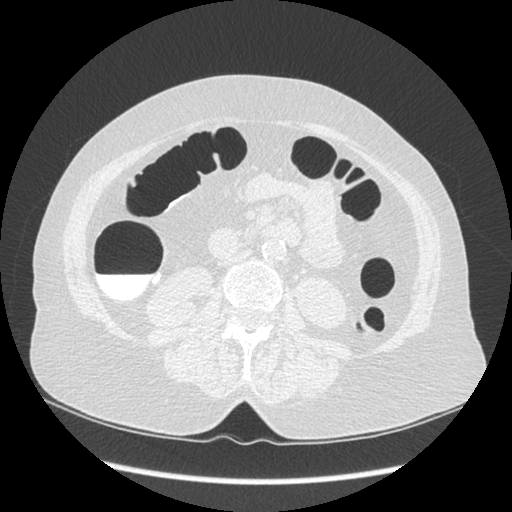
[im 295/393  soft-tissue]
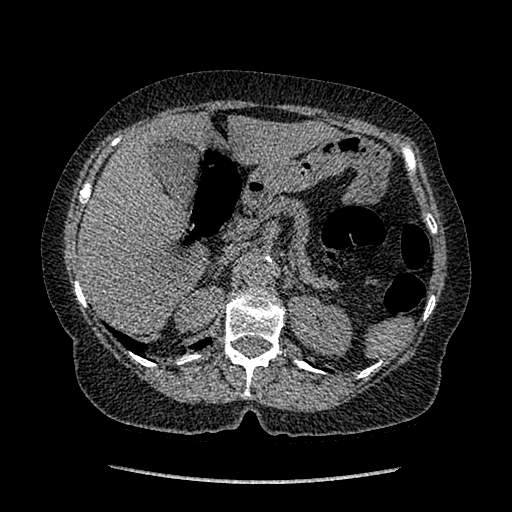
[im 295/393  lung]
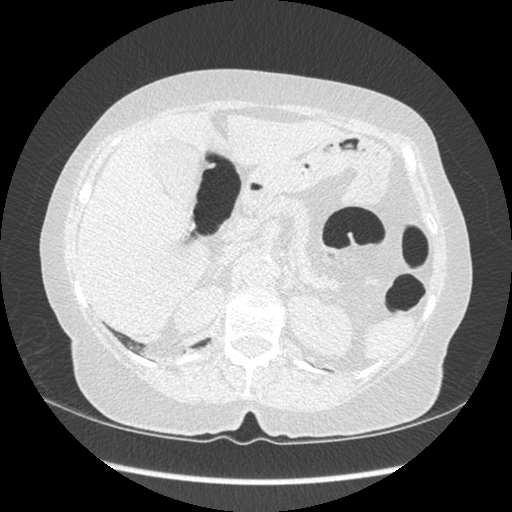
[im 344/393  soft-tissue]
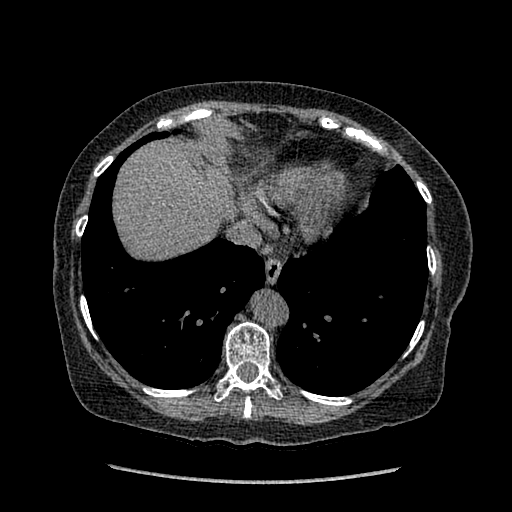
[im 344/393  lung]
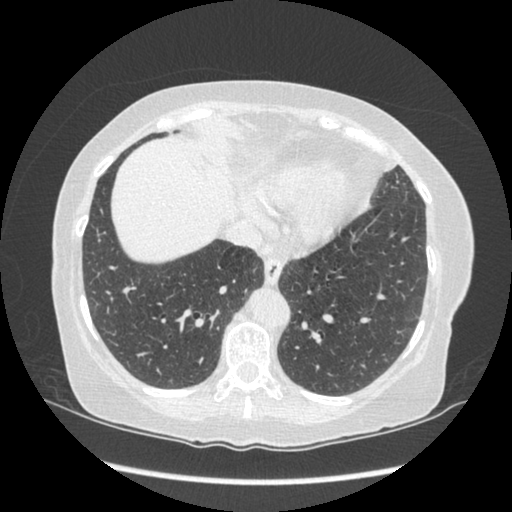

[Series 6: prone (id) · axial · 0.73mm/px · z∈[-415,-234]mm · 4 of 387 slices shown]
[im 49/387  soft-tissue]
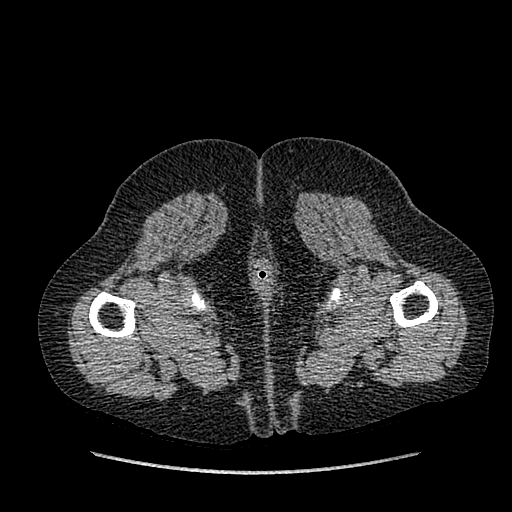
[im 97/387  soft-tissue]
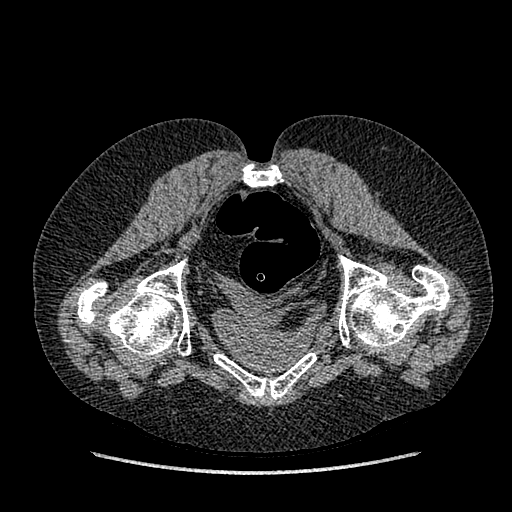
[im 145/387  soft-tissue]
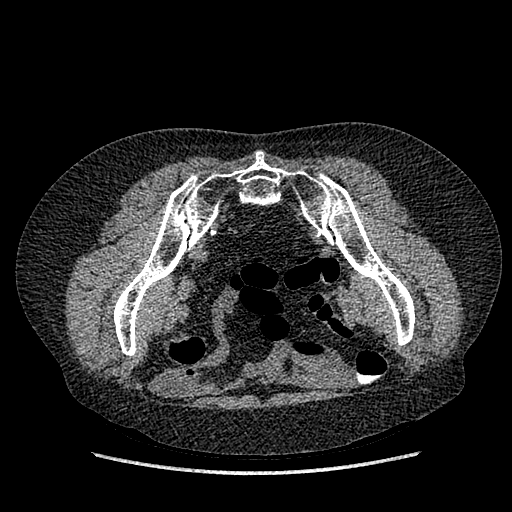
[im 194/387  soft-tissue]
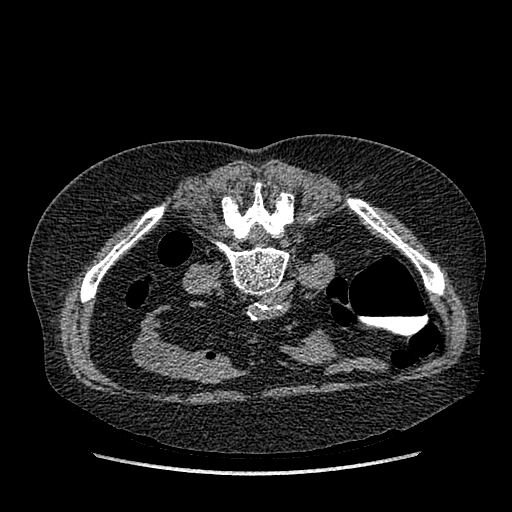

[Series 601: coronal body · coronal · 0.96mm/px · 1 of 116 slices shown, 2 images]
[im 39/116  soft-tissue]
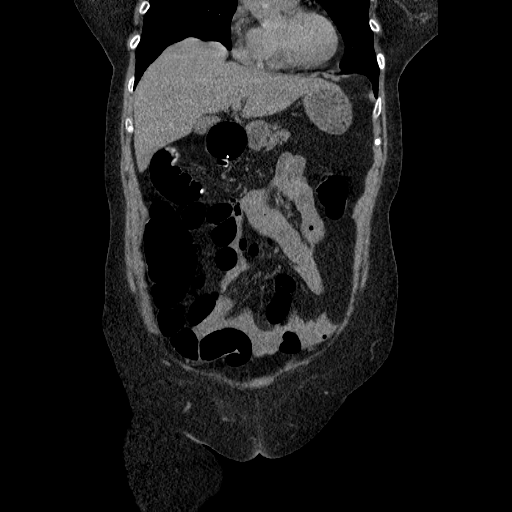
[im 39/116  bone]
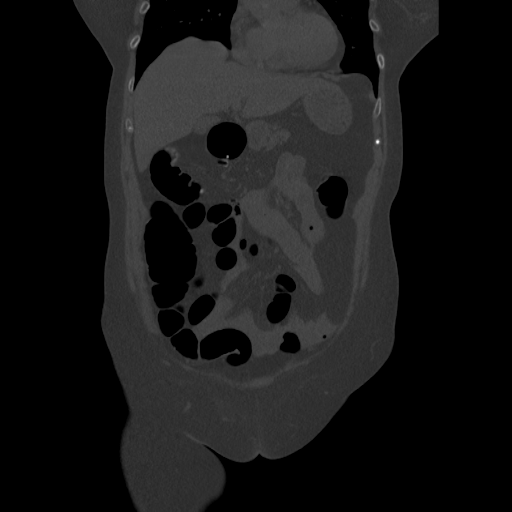

[12 of 36 positions shown; findings below may reference images not displayed]

FINDINGS: Both 2-D and 3-D images were reviewed.  At 9 cm from the
anal verge there is a linear soft tissue defect present which may
represent a polyp on a stalk.  This persists on both supine and
prone images, and would be accessible to sigmoidoscopy.  Additional
small polyps are noted with a 4 mm polyp present at 49 cm from the
anal verge, and a 6 mm polyp at 102 cm from the anal verge.  There
are scattered diverticula throughout the colon.  The rectosigmoid
colon does not distend optimally due to diverticulosis with more
prominent diverticula within the rectosigmoid colon.  No
constricting lesion is seen.  The right colon is well visualized
and appears normal and the ileocecal valve is unremarkable.
IMPRESSION: 1.  Linear stalk like soft tissue abnormality at 9 cm from the anal
verge suspicious for a polyp on a stalk. This lesion would be
accessible by sigmoidoscopy

2.  6 mm polyp at 102 cm from the anal verge.  No other clinically
significant polyp is evident.

Virtual colonoscopy is not designed to detect diminutive polyps
(i.e., less than or equal to 5 mm), the presence or absence of
which may not affect clinical management

CT ABDOMEN AND PELVIS WITHOUT CONTRAST
FINDINGS: The lung bases are clear.  The liver is unremarkable in
the unenhanced state.  No calcified gallstones are noted.  The
pancreas is slightly atrophic and the pancreatic duct is not
dilated.  The adrenal glands and spleen are unremarkable.  The
stomach is decompressed.  No renal calculi are seen and no renal
mass or hydronephrosis is noted.  The abdominal aorta is normal in
caliber with moderate atheromatous change present.  No adenopathy
is seen.

The urinary bladder is decompressed.  The uterus is normal in size.
No adnexal lesion is seen.  No fluid is noted within the pelvis.
The terminal ileum and appendix are unremarkable, with the appendix
filling with oral contrast.  There is a 6 mm anterolisthesis of L5
on S1 which appears to be due to degenerative change involving the
facet joints of the lower lumbar spine.  There is degenerative disc
disease at the L5 S1 level.  Otherwise alignment is normal.
IMPRESSION: 1.  No abdominal or pelvic mass or adenopathy.
2.  The appendix and terminal ileum appear normal.
3.  6 mm anterolisthesis of L5 on S1 due to degenerative change of
the facet joints.  Degenerative disc disease at L5 S1.
4.  Scattered colonic diverticula.

## 2014-09-24 DIAGNOSIS — K5732 Diverticulitis of large intestine without perforation or abscess without bleeding: Secondary | ICD-10-CM | POA: Diagnosis not present

## 2014-09-24 DIAGNOSIS — M1990 Unspecified osteoarthritis, unspecified site: Secondary | ICD-10-CM | POA: Diagnosis not present

## 2014-09-24 DIAGNOSIS — F339 Major depressive disorder, recurrent, unspecified: Secondary | ICD-10-CM | POA: Diagnosis not present

## 2014-09-24 DIAGNOSIS — E780 Pure hypercholesterolemia: Secondary | ICD-10-CM | POA: Diagnosis not present

## 2014-09-24 DIAGNOSIS — M810 Age-related osteoporosis without current pathological fracture: Secondary | ICD-10-CM | POA: Diagnosis not present

## 2014-09-24 DIAGNOSIS — R1030 Lower abdominal pain, unspecified: Secondary | ICD-10-CM | POA: Diagnosis not present

## 2014-09-24 DIAGNOSIS — K219 Gastro-esophageal reflux disease without esophagitis: Secondary | ICD-10-CM | POA: Diagnosis not present

## 2014-09-24 DIAGNOSIS — I10 Essential (primary) hypertension: Secondary | ICD-10-CM | POA: Diagnosis not present

## 2014-10-12 DIAGNOSIS — M810 Age-related osteoporosis without current pathological fracture: Secondary | ICD-10-CM | POA: Diagnosis not present

## 2015-02-16 DIAGNOSIS — I10 Essential (primary) hypertension: Secondary | ICD-10-CM | POA: Diagnosis not present

## 2015-02-16 DIAGNOSIS — F339 Major depressive disorder, recurrent, unspecified: Secondary | ICD-10-CM | POA: Diagnosis not present

## 2015-02-16 DIAGNOSIS — E780 Pure hypercholesterolemia: Secondary | ICD-10-CM | POA: Diagnosis not present

## 2015-02-16 DIAGNOSIS — F419 Anxiety disorder, unspecified: Secondary | ICD-10-CM | POA: Diagnosis not present

## 2015-02-16 DIAGNOSIS — L989 Disorder of the skin and subcutaneous tissue, unspecified: Secondary | ICD-10-CM | POA: Diagnosis not present

## 2015-02-16 DIAGNOSIS — M1990 Unspecified osteoarthritis, unspecified site: Secondary | ICD-10-CM | POA: Diagnosis not present

## 2015-02-16 DIAGNOSIS — M810 Age-related osteoporosis without current pathological fracture: Secondary | ICD-10-CM | POA: Diagnosis not present

## 2015-04-29 ENCOUNTER — Other Ambulatory Visit: Payer: Self-pay

## 2015-04-29 DIAGNOSIS — Z1231 Encounter for screening mammogram for malignant neoplasm of breast: Secondary | ICD-10-CM

## 2015-05-31 ENCOUNTER — Ambulatory Visit: Payer: Medicare Other

## 2015-07-01 ENCOUNTER — Ambulatory Visit
Admission: RE | Admit: 2015-07-01 | Discharge: 2015-07-01 | Disposition: A | Discharge status: Home / Self Care | Payer: Medicare Other | Source: Ambulatory Visit

## 2015-07-01 DIAGNOSIS — Z1231 Encounter for screening mammogram for malignant neoplasm of breast: Secondary | ICD-10-CM

## 2015-08-19 DIAGNOSIS — I10 Essential (primary) hypertension: Secondary | ICD-10-CM | POA: Diagnosis not present

## 2015-08-19 DIAGNOSIS — E780 Pure hypercholesterolemia: Secondary | ICD-10-CM | POA: Diagnosis not present

## 2015-08-19 DIAGNOSIS — Z23 Encounter for immunization: Secondary | ICD-10-CM | POA: Diagnosis not present

## 2015-08-19 DIAGNOSIS — F419 Anxiety disorder, unspecified: Secondary | ICD-10-CM | POA: Diagnosis not present

## 2015-08-19 DIAGNOSIS — K219 Gastro-esophageal reflux disease without esophagitis: Secondary | ICD-10-CM | POA: Diagnosis not present

## 2015-08-19 DIAGNOSIS — M810 Age-related osteoporosis without current pathological fracture: Secondary | ICD-10-CM | POA: Diagnosis not present

## 2015-08-26 DIAGNOSIS — K5792 Diverticulitis of intestine, part unspecified, without perforation or abscess without bleeding: Secondary | ICD-10-CM | POA: Diagnosis not present

## 2015-09-03 ENCOUNTER — Emergency Department (HOSPITAL_COMMUNITY)
Admission: EM | Admit: 2015-09-03 | Discharge: 2015-09-03 | Disposition: A | Discharge status: Home / Self Care | Payer: Medicare Other | Attending: Emergency Medicine | Admitting: Emergency Medicine

## 2015-09-03 ENCOUNTER — Emergency Department (HOSPITAL_COMMUNITY): Payer: Medicare Other

## 2015-09-03 ENCOUNTER — Encounter (HOSPITAL_COMMUNITY): Payer: Self-pay | Admitting: Emergency Medicine

## 2015-09-03 DIAGNOSIS — R1032 Left lower quadrant pain: Secondary | ICD-10-CM | POA: Insufficient documentation

## 2015-09-03 DIAGNOSIS — R1030 Lower abdominal pain, unspecified: Secondary | ICD-10-CM | POA: Diagnosis not present

## 2015-09-03 DIAGNOSIS — I10 Essential (primary) hypertension: Secondary | ICD-10-CM | POA: Diagnosis not present

## 2015-09-03 DIAGNOSIS — R110 Nausea: Secondary | ICD-10-CM | POA: Insufficient documentation

## 2015-09-03 DIAGNOSIS — Z720 Tobacco use: Secondary | ICD-10-CM | POA: Insufficient documentation

## 2015-09-03 DIAGNOSIS — M160 Bilateral primary osteoarthritis of hip: Secondary | ICD-10-CM | POA: Insufficient documentation

## 2015-09-03 DIAGNOSIS — Z87440 Personal history of urinary (tract) infections: Secondary | ICD-10-CM | POA: Diagnosis not present

## 2015-09-03 DIAGNOSIS — F419 Anxiety disorder, unspecified: Secondary | ICD-10-CM | POA: Insufficient documentation

## 2015-09-03 DIAGNOSIS — R251 Tremor, unspecified: Secondary | ICD-10-CM | POA: Diagnosis not present

## 2015-09-03 DIAGNOSIS — Z79899 Other long term (current) drug therapy: Secondary | ICD-10-CM | POA: Diagnosis not present

## 2015-09-03 DIAGNOSIS — Z7951 Long term (current) use of inhaled steroids: Secondary | ICD-10-CM | POA: Insufficient documentation

## 2015-09-03 DIAGNOSIS — K219 Gastro-esophageal reflux disease without esophagitis: Secondary | ICD-10-CM | POA: Diagnosis not present

## 2015-09-03 LAB — COMPREHENSIVE METABOLIC PANEL
ALT: 14 U/L (ref 14–54)
AST: 21 U/L (ref 15–41)
Albumin: 4 g/dL (ref 3.5–5.0)
Alkaline Phosphatase: 44 U/L (ref 38–126)
Anion gap: 9 (ref 5–15)
BILIRUBIN TOTAL: 0.6 mg/dL (ref 0.3–1.2)
BUN: 12 mg/dL (ref 6–20)
CALCIUM: 9.2 mg/dL (ref 8.9–10.3)
CO2: 23 mmol/L (ref 22–32)
Chloride: 98 mmol/L — ABNORMAL LOW (ref 101–111)
Creatinine, Ser: 0.6 mg/dL (ref 0.44–1.00)
GFR calc Af Amer: >60 mL/min (ref >60)
Glucose, Bld: 113 mg/dL — ABNORMAL HIGH (ref 65–99)
POTASSIUM: 4.4 mmol/L (ref 3.5–5.1)
Sodium: 130 mmol/L — ABNORMAL LOW (ref 135–145)
TOTAL PROTEIN: 7.4 g/dL (ref 6.5–8.1)

## 2015-09-03 LAB — URINE MICROSCOPIC-ADD ON

## 2015-09-03 LAB — CBC WITH DIFFERENTIAL/PLATELET
BASOS ABS: 0 10*3/uL (ref 0.0–0.1)
Basophils Relative: 0 %
Eosinophils Absolute: 0 10*3/uL (ref 0.0–0.7)
Eosinophils Relative: 0 %
HEMATOCRIT: 45.5 % (ref 36.0–46.0)
Hemoglobin: 15.7 g/dL — ABNORMAL HIGH (ref 12.0–15.0)
LYMPHS PCT: 31 %
Lymphs Abs: 2.8 10*3/uL (ref 0.7–4.0)
MCH: 33.8 pg (ref 26.0–34.0)
MCHC: 34.5 g/dL (ref 30.0–36.0)
MCV: 98.1 fL (ref 78.0–100.0)
MONO ABS: 0.8 10*3/uL (ref 0.1–1.0)
MONOS PCT: 9 %
NEUTROS ABS: 5.4 10*3/uL (ref 1.7–7.7)
Neutrophils Relative %: 60 %
Platelets: 252 10*3/uL (ref 150–400)
RBC: 4.64 MIL/uL (ref 3.87–5.11)
RDW: 12.9 % (ref 11.5–15.5)
WBC: 9.1 10*3/uL (ref 4.0–10.5)

## 2015-09-03 LAB — URINALYSIS, ROUTINE W REFLEX MICROSCOPIC
BILIRUBIN URINE: NEGATIVE
GLUCOSE, UA: NEGATIVE mg/dL
HGB URINE DIPSTICK: NEGATIVE
Ketones, ur: NEGATIVE mg/dL
Nitrite: NEGATIVE
PROTEIN: NEGATIVE mg/dL
Specific Gravity, Urine: 1.01 (ref 1.005–1.030)
Urobilinogen, UA: 0.2 mg/dL (ref 0.0–1.0)
pH: 6 (ref 5.0–8.0)

## 2015-09-03 MED ORDER — FENTANYL CITRATE (PF) 100 MCG/2ML IJ SOLN
50.0000 ug | Freq: Once | INTRAMUSCULAR | Status: AC
  Administered 2015-09-03: 50 ug via INTRAVENOUS
  Filled 2015-09-03: qty 2

## 2015-09-03 MED ORDER — ALUM & MAG HYDROXIDE-SIMETH 200-200-20 MG/5 ML NICU TOPICAL: 1.0000 "application " | TOPICAL | Status: DC | PRN

## 2015-09-03 MED ORDER — ONDANSETRON HCL 4 MG/2ML IJ SOLN
4.0000 mg | Freq: Once | INTRAMUSCULAR | Status: AC
  Administered 2015-09-03: 4 mg via INTRAVENOUS
  Filled 2015-09-03: qty 2

## 2015-09-03 MED ORDER — SODIUM CHLORIDE 0.9 % IV BOLUS (SEPSIS)
1000.0000 mL | Freq: Once | INTRAVENOUS | Status: AC
  Administered 2015-09-03: 1000 mL via INTRAVENOUS

## 2015-09-03 MED ORDER — IOHEXOL 300 MG/ML  SOLN
25.0000 mL | INTRAMUSCULAR | Status: AC
  Administered 2015-09-03 (×2): 25 mL via ORAL

## 2015-09-03 MED ORDER — IOHEXOL 300 MG/ML  SOLN
100.0000 mL | Freq: Once | INTRAMUSCULAR | Status: AC | PRN
  Administered 2015-09-03: 100 mL via INTRAVENOUS

## 2015-09-03 MED ORDER — OMEPRAZOLE 20 MG PO CPDR
20.0000 mg | DELAYED_RELEASE_CAPSULE | Freq: Every day | ORAL | Status: DC
Start: 2015-09-03 — End: 2018-01-04

## 2015-09-03 NOTE — ED Provider Notes (Signed)
CSN: [id number]     Arrival date & time 09/03/15  1107 History   First MD Initiated Contact with Patient 09/03/15 1111     Chief Complaint  Patient presents with  . Abdominal Pain     (Consider location/radiation/quality/duration/timing/severity/associated sxs/prior Treatment) Patient is a 79 y.o. female presenting with abdominal pain.  Abdominal Pain Pain location:  Suprapubic and LLQ Pain quality: aching   Pain radiates to:  Does not radiate Pain severity:  Mild Onset quality:  Gradual Duration:  4 hours Timing:  Constant Chronicity:  New Context: not alcohol use, not previous surgeries and not recent illness   Relieved by:  None tried Worsened by:  Nothing tried Associated symptoms: nausea   Associated symptoms: no anorexia, no belching, no chest pain, no cough, no dysuria, no fatigue, no fever and no shortness of breath     Past Medical History  Diagnosis Date  . Hypertension   . Complication of anesthesia     B/P dropped post colonoscopy x1  . Anxiety   . GERD (gastroesophageal reflux disease)   . Arthritis     arthritis -hips  . Headache(784.0)     migraines rare occ.  Marland Kitchen Urinary tract infection early aug 2014   Past Surgical History  Procedure Laterality Date  . Cataract extraction, bilateral Bilateral   . Lasik    . Colonoscopy with propofol N/A 08/03/2013    Procedure: COLONOSCOPY WITH PROPOFOL;  Surgeon: Winfield Cunas., MD;  Location: [facility];  Service: Endoscopy;  Laterality: N/A;  ultra thin colon scope   No family history on file. Social History  Substance Use Topics  . Smoking status: Current Every Day Smoker -- 1.00 packs/day for 40 years    Types: Cigarettes  . Smokeless tobacco: Never Used  . Alcohol Use: No   OB History    No data available     Review of Systems  Constitutional: Negative for fever, activity change and fatigue.       Shakes  HENT: Negative for congestion and facial swelling.   Eyes: Negative for discharge and  redness.  Respiratory: Negative for cough and shortness of breath.   Cardiovascular: Negative for chest pain and palpitations.  Gastrointestinal: Positive for nausea and abdominal pain. Negative for abdominal distention and anorexia.  Endocrine: Negative for polydipsia and polyuria.  Genitourinary: Negative for dysuria and menstrual problem.  Musculoskeletal: Negative for back pain and joint swelling.  Skin: Negative for color change and wound.  Neurological: Negative for dizziness, light-headedness and headaches.  All other systems reviewed and are negative.     Allergies  Ciprofloxacin; Nitrofuran derivatives; and Sulfa antibiotics  Home Medications   Prior to Admission medications   Medication Sig Start Date End Date Taking? Authorizing Provider  acetaminophen (TYLENOL) 500 MG tablet Take 1,000 mg by mouth every 6 (six) hours as needed for pain.   Yes Historical Provider, MD  alendronate (FOSAMAX) 70 MG tablet Take 70 mg by mouth every 7 (seven) days. Take with a full glass of water on an empty stomach.   Yes Historical Provider, MD  ALPRAZolam (XANAX) 0.25 MG tablet Take 0.25 mg by mouth 3 (three) times daily as needed for anxiety.   Yes Historical Provider, MD  amLODipine-benazepril (LOTREL) 5-20 MG per capsule Take 1 capsule by mouth every morning.   Yes Historical Provider, MD  Calcium Carbonate-Vitamin D (CALTRATE 600+D PO) Take 1 tablet by mouth daily.   Yes Historical Provider, MD  diphenhydrAMINE (BENADRYL) 25 MG tablet  Take 25 mg by mouth every 6 (six) hours as needed for allergies.   Yes Historical Provider, MD  escitalopram (LEXAPRO) 10 MG tablet Take 10 mg by mouth daily.   Yes Historical Provider, MD  Multiple Vitamin (MULTIVITAMIN WITH MINERALS) TABS tablet Take 1 tablet by mouth daily.   Yes Historical Provider, MD  Probiotic Product (ALIGN) 4 MG CAPS Take 1 capsule by mouth daily.   Yes Historical Provider, MD  ranitidine (ZANTAC) 300 MG capsule Take 300 mg by mouth 2  (two) times daily.   Yes Historical Provider, MD  simvastatin (ZOCOR) 20 MG tablet Take 20 mg by mouth every evening.   Yes Historical Provider, MD  triamcinolone (NASACORT ALLERGY 24HR) 55 MCG/ACT AERO nasal inhaler Place 2 sprays into the nose daily.   Yes Historical Provider, MD  alum & mag hydroxide-simeth (MAALOX/MYLANTA) 200-200-20 MG/5 SUSP Apply 1 application topically as needed (reflux). 09/03/15   Merrily Pew, MD  omeprazole (PRILOSEC) 20 MG capsule Take 1 capsule (20 mg total) by mouth daily. 09/03/15   Corene Cornea Berdina Cheever, MD   BP 108/39 mmHg  Pulse 92  Temp(Src) 98.1 F (36.7 C) (Oral)  Resp 16  Ht 5' (1.524 m)  Wt 119 lb (53.978 kg)  BMI 23.24 kg/m2  SpO2 96% Physical Exam  Constitutional: She is oriented to person, place, and time. She appears well-developed and well-nourished.  HENT:  Head: Normocephalic and atraumatic.  Eyes: Conjunctivae and EOM are normal. Right eye exhibits no discharge. Left eye exhibits no discharge.  Cardiovascular: Normal rate and regular rhythm.   Pulmonary/Chest: Effort normal and breath sounds normal. No respiratory distress.  Abdominal: Soft. She exhibits no distension. There is tenderness (suprapubic and llq). There is no rebound.  Musculoskeletal: Normal range of motion. She exhibits no edema or tenderness.  Neurological: She is alert and oriented to person, place, and time.  Skin: Skin is warm and dry.  Nursing note and vitals reviewed.   ED Course  Procedures (including critical care time) Labs Review Labs Reviewed  CBC WITH DIFFERENTIAL/PLATELET - Abnormal; Notable for the following:    Hemoglobin 15.7 (*)    All other components within normal limits  COMPREHENSIVE METABOLIC PANEL - Abnormal; Notable for the following:    Sodium 130 (*)    Chloride 98 (*)    Glucose, Bld 113 (*)    All other components within normal limits  URINALYSIS, ROUTINE W REFLEX MICROSCOPIC (NOT AT [facility]) - Abnormal; Notable for the following:    Leukocytes, UA  TRACE (*)    All other components within normal limits  URINE MICROSCOPIC-ADD ON    Imaging Review Ct Abdomen Pelvis W Contrast  09/03/2015   CLINICAL DATA:  Mid abdominal pain radiating into the left lower quadrant.  EXAM: CT ABDOMEN AND PELVIS WITH CONTRAST  TECHNIQUE: Multidetector CT imaging of the abdomen and pelvis was performed using the standard protocol following bolus administration of intravenous contrast.  CONTRAST:  173mL OMNIPAQUE IOHEXOL 300 MG/ML  SOLN  COMPARISON:  05/22/2013  FINDINGS: Lower chest:  No significant abnormality  Hepatobiliary: There are normal appearances of the liver, gallbladder and bile ducts.  Pancreas: Normal  Spleen: Normal  Adrenals/Urinary Tract: The adrenals and kidneys are normal in appearance. There is no urinary calculus evident. There is no hydronephrosis or ureteral dilatation. Collecting systems and ureters appear unremarkable.  Stomach/Bowel: The stomach, small bowel and appendix are normal. The colon is remarkable only for moderate sigmoid diverticulosis.  Vascular/Lymphatic: The abdominal aorta is normal in  caliber. There is mild atherosclerotic calcification. There is no adenopathy in the abdomen or pelvis.  Reproductive: The uterus and ovaries appear unremarkable.  Other: No acute inflammatory changes are evident in the abdomen or pelvis. There is no ascites.  Musculoskeletal: There is moderately severe facet arthritis at L4-5, right worse than left. There is grade 2 spondylolisthesis at L4-5. No significant skeletal lesions are evident.  IMPRESSION: 1. No acute findings are evident in the abdomen or pelvis. 2. Diverticulosis 3. Lower lumbar facet arthritis and degenerative spondylolisthesis.   Electronically Signed   By: Andreas Newport M.D.   On: 09/03/2015 18:14   I have personally reviewed and evaluated these images and lab results as part of my medical decision-making.   EKG Interpretation None      MDM   Final diagnoses:  Left lower  quadrant pain   UTI v diverticulitis. If UA negative, will CT to eval for complications of diverticulitis. Pain/nausea/fluids.   All labs negative CT was done which showed evidence of diverticulosis but no diverticulitis. Unsure of the cause of the patient's symptoms. May need outpatient ultrasound to evaluate gallbladder for evidence of biliary colic. I related her symptoms are improved now I will suggest antireflux medications and PCP follow up in a couple days for the meantime.   I have personally and contemperaneously reviewed labs and imaging and used in my decision making as above.   A medical screening exam was performed and I feel the patient has had an appropriate workup for their chief complaint at this time and likelihood of emergent condition existing is low. They have been counseled on decision, discharge, follow up and which symptoms necessitate immediate return to the emergency department. They or their family verbally stated understanding and agreement with plan and discharged in stable condition.      Merrily Pew, MD 09/04/15 [phone]

## 2015-09-03 NOTE — ED Notes (Signed)
Pt and family updated and informed of pt CT to be due at 1720. Pt 2 hour drinker. Verbalized understanding.

## 2015-09-03 NOTE — ED Notes (Signed)
Pt from home for eval of generalized abd pressure and nausea that got worse this morning, pt states hx of diverticulitis and states was given antibiotics by PCP but did not finish them all because "they tore my stomach up." pt denies any diarrhea or emesis and also denies rectal bleeding. Pt alert and oriented, skin warm and dry,  Nad noted.

## 2015-09-05 DIAGNOSIS — R1013 Epigastric pain: Secondary | ICD-10-CM | POA: Diagnosis not present

## 2015-09-20 DIAGNOSIS — N3000 Acute cystitis without hematuria: Secondary | ICD-10-CM | POA: Diagnosis not present

## 2015-09-20 DIAGNOSIS — R300 Dysuria: Secondary | ICD-10-CM | POA: Diagnosis not present

## 2015-10-06 DIAGNOSIS — I10 Essential (primary) hypertension: Secondary | ICD-10-CM | POA: Diagnosis not present

## 2015-10-06 DIAGNOSIS — K219 Gastro-esophageal reflux disease without esophagitis: Secondary | ICD-10-CM | POA: Diagnosis not present

## 2015-10-06 DIAGNOSIS — E7800 Pure hypercholesterolemia, unspecified: Secondary | ICD-10-CM | POA: Diagnosis not present

## 2015-10-06 DIAGNOSIS — F17200 Nicotine dependence, unspecified, uncomplicated: Secondary | ICD-10-CM | POA: Diagnosis not present

## 2015-12-27 DIAGNOSIS — J209 Acute bronchitis, unspecified: Secondary | ICD-10-CM | POA: Diagnosis not present

## 2016-04-26 DIAGNOSIS — R1030 Lower abdominal pain, unspecified: Secondary | ICD-10-CM | POA: Diagnosis not present

## 2016-05-09 DIAGNOSIS — R1013 Epigastric pain: Secondary | ICD-10-CM | POA: Diagnosis not present

## 2016-05-09 DIAGNOSIS — F419 Anxiety disorder, unspecified: Secondary | ICD-10-CM | POA: Diagnosis not present

## 2016-06-08 DIAGNOSIS — J0100 Acute maxillary sinusitis, unspecified: Secondary | ICD-10-CM | POA: Diagnosis not present

## 2016-06-08 DIAGNOSIS — R1013 Epigastric pain: Secondary | ICD-10-CM | POA: Diagnosis not present

## 2016-06-08 DIAGNOSIS — F419 Anxiety disorder, unspecified: Secondary | ICD-10-CM | POA: Diagnosis not present

## 2016-07-25 ENCOUNTER — Other Ambulatory Visit: Payer: Self-pay | Admitting: Family Medicine

## 2016-07-25 DIAGNOSIS — Z1231 Encounter for screening mammogram for malignant neoplasm of breast: Secondary | ICD-10-CM

## 2016-07-26 DIAGNOSIS — M810 Age-related osteoporosis without current pathological fracture: Secondary | ICD-10-CM | POA: Diagnosis not present

## 2016-07-26 DIAGNOSIS — F17200 Nicotine dependence, unspecified, uncomplicated: Secondary | ICD-10-CM | POA: Diagnosis not present

## 2016-07-26 DIAGNOSIS — F419 Anxiety disorder, unspecified: Secondary | ICD-10-CM | POA: Diagnosis not present

## 2016-07-26 DIAGNOSIS — K219 Gastro-esophageal reflux disease without esophagitis: Secondary | ICD-10-CM | POA: Diagnosis not present

## 2016-07-26 DIAGNOSIS — E7800 Pure hypercholesterolemia, unspecified: Secondary | ICD-10-CM | POA: Diagnosis not present

## 2016-07-26 DIAGNOSIS — F324 Major depressive disorder, single episode, in partial remission: Secondary | ICD-10-CM | POA: Diagnosis not present

## 2016-07-26 DIAGNOSIS — Z23 Encounter for immunization: Secondary | ICD-10-CM | POA: Diagnosis not present

## 2016-07-26 DIAGNOSIS — Z0000 Encounter for general adult medical examination without abnormal findings: Secondary | ICD-10-CM | POA: Diagnosis not present

## 2016-07-26 DIAGNOSIS — I10 Essential (primary) hypertension: Secondary | ICD-10-CM | POA: Diagnosis not present

## 2016-07-27 ENCOUNTER — Ambulatory Visit
Admission: RE | Admit: 2016-07-27 | Discharge: 2016-07-27 | Disposition: A | Discharge status: Home / Self Care | Payer: Medicare Other | Source: Ambulatory Visit | Attending: Family Medicine | Admitting: Family Medicine

## 2016-07-27 DIAGNOSIS — Z1231 Encounter for screening mammogram for malignant neoplasm of breast: Secondary | ICD-10-CM

## 2017-01-01 DIAGNOSIS — M810 Age-related osteoporosis without current pathological fracture: Secondary | ICD-10-CM | POA: Diagnosis not present

## 2017-01-28 DIAGNOSIS — F17200 Nicotine dependence, unspecified, uncomplicated: Secondary | ICD-10-CM | POA: Diagnosis not present

## 2017-01-28 DIAGNOSIS — F324 Major depressive disorder, single episode, in partial remission: Secondary | ICD-10-CM | POA: Diagnosis not present

## 2017-01-28 DIAGNOSIS — H6981 Other specified disorders of Eustachian tube, right ear: Secondary | ICD-10-CM | POA: Diagnosis not present

## 2017-01-28 DIAGNOSIS — M810 Age-related osteoporosis without current pathological fracture: Secondary | ICD-10-CM | POA: Diagnosis not present

## 2017-01-28 DIAGNOSIS — E7800 Pure hypercholesterolemia, unspecified: Secondary | ICD-10-CM | POA: Diagnosis not present

## 2017-01-28 DIAGNOSIS — K219 Gastro-esophageal reflux disease without esophagitis: Secondary | ICD-10-CM | POA: Diagnosis not present

## 2017-01-28 DIAGNOSIS — I10 Essential (primary) hypertension: Secondary | ICD-10-CM | POA: Diagnosis not present

## 2017-07-31 DIAGNOSIS — Z23 Encounter for immunization: Secondary | ICD-10-CM | POA: Diagnosis not present

## 2017-07-31 DIAGNOSIS — F17200 Nicotine dependence, unspecified, uncomplicated: Secondary | ICD-10-CM | POA: Diagnosis not present

## 2017-07-31 DIAGNOSIS — F419 Anxiety disorder, unspecified: Secondary | ICD-10-CM | POA: Diagnosis not present

## 2017-07-31 DIAGNOSIS — I10 Essential (primary) hypertension: Secondary | ICD-10-CM | POA: Diagnosis not present

## 2017-07-31 DIAGNOSIS — E7800 Pure hypercholesterolemia, unspecified: Secondary | ICD-10-CM | POA: Diagnosis not present

## 2017-07-31 DIAGNOSIS — M810 Age-related osteoporosis without current pathological fracture: Secondary | ICD-10-CM | POA: Diagnosis not present

## 2017-07-31 DIAGNOSIS — K219 Gastro-esophageal reflux disease without esophagitis: Secondary | ICD-10-CM | POA: Diagnosis not present

## 2017-07-31 DIAGNOSIS — Z1389 Encounter for screening for other disorder: Secondary | ICD-10-CM | POA: Diagnosis not present

## 2017-09-27 DIAGNOSIS — R300 Dysuria: Secondary | ICD-10-CM | POA: Diagnosis not present

## 2017-09-27 DIAGNOSIS — K5792 Diverticulitis of intestine, part unspecified, without perforation or abscess without bleeding: Secondary | ICD-10-CM | POA: Diagnosis not present

## 2017-09-27 DIAGNOSIS — K625 Hemorrhage of anus and rectum: Secondary | ICD-10-CM | POA: Diagnosis not present

## 2017-10-30 ENCOUNTER — Other Ambulatory Visit: Payer: Self-pay | Admitting: Gastroenterology

## 2017-10-30 DIAGNOSIS — R1084 Generalized abdominal pain: Secondary | ICD-10-CM | POA: Diagnosis not present

## 2017-10-30 DIAGNOSIS — K219 Gastro-esophageal reflux disease without esophagitis: Secondary | ICD-10-CM | POA: Diagnosis not present

## 2017-10-30 DIAGNOSIS — R634 Abnormal weight loss: Secondary | ICD-10-CM | POA: Diagnosis not present

## 2017-10-30 DIAGNOSIS — K5792 Diverticulitis of intestine, part unspecified, without perforation or abscess without bleeding: Secondary | ICD-10-CM | POA: Diagnosis not present

## 2017-10-30 DIAGNOSIS — F419 Anxiety disorder, unspecified: Secondary | ICD-10-CM | POA: Diagnosis not present

## 2017-10-30 DIAGNOSIS — R140 Abdominal distension (gaseous): Secondary | ICD-10-CM | POA: Diagnosis not present

## 2017-10-30 DIAGNOSIS — Z86010 Personal history of colonic polyps: Secondary | ICD-10-CM | POA: Diagnosis not present

## 2017-10-30 DIAGNOSIS — R110 Nausea: Secondary | ICD-10-CM | POA: Diagnosis not present

## 2017-11-01 ENCOUNTER — Ambulatory Visit
Admission: RE | Admit: 2017-11-01 | Discharge: 2017-11-01 | Disposition: A | Discharge status: Home / Self Care | Payer: Medicare Other | Source: Ambulatory Visit | Attending: Gastroenterology | Admitting: Gastroenterology

## 2017-11-01 DIAGNOSIS — K5730 Diverticulosis of large intestine without perforation or abscess without bleeding: Secondary | ICD-10-CM | POA: Diagnosis not present

## 2017-11-01 DIAGNOSIS — R1084 Generalized abdominal pain: Secondary | ICD-10-CM

## 2017-11-01 MED ORDER — IOPAMIDOL (ISOVUE-300) INJECTION 61%
100.0000 mL | Freq: Once | INTRAVENOUS | Status: AC | PRN
  Administered 2017-11-01: 100 mL via INTRAVENOUS

## 2017-11-06 DIAGNOSIS — K5792 Diverticulitis of intestine, part unspecified, without perforation or abscess without bleeding: Secondary | ICD-10-CM | POA: Diagnosis not present

## 2017-11-06 DIAGNOSIS — F419 Anxiety disorder, unspecified: Secondary | ICD-10-CM | POA: Diagnosis not present

## 2017-11-06 DIAGNOSIS — K219 Gastro-esophageal reflux disease without esophagitis: Secondary | ICD-10-CM | POA: Diagnosis not present

## 2017-11-06 DIAGNOSIS — I10 Essential (primary) hypertension: Secondary | ICD-10-CM | POA: Diagnosis not present

## 2017-11-06 DIAGNOSIS — F17200 Nicotine dependence, unspecified, uncomplicated: Secondary | ICD-10-CM | POA: Diagnosis not present

## 2017-11-12 DIAGNOSIS — K5792 Diverticulitis of intestine, part unspecified, without perforation or abscess without bleeding: Secondary | ICD-10-CM | POA: Diagnosis not present

## 2017-12-09 ENCOUNTER — Other Ambulatory Visit: Payer: Self-pay | Admitting: Family Medicine

## 2017-12-09 DIAGNOSIS — Z1231 Encounter for screening mammogram for malignant neoplasm of breast: Secondary | ICD-10-CM

## 2017-12-31 ENCOUNTER — Ambulatory Visit: Payer: Medicare Other

## 2018-01-02 ENCOUNTER — Ambulatory Visit: Payer: Medicare Other

## 2018-01-04 ENCOUNTER — Emergency Department (HOSPITAL_BASED_OUTPATIENT_CLINIC_OR_DEPARTMENT_OTHER): Payer: Medicare Other

## 2018-01-04 ENCOUNTER — Encounter (HOSPITAL_BASED_OUTPATIENT_CLINIC_OR_DEPARTMENT_OTHER): Payer: Self-pay | Admitting: *Deleted

## 2018-01-04 ENCOUNTER — Other Ambulatory Visit: Payer: Self-pay

## 2018-01-04 ENCOUNTER — Inpatient Hospital Stay (HOSPITAL_BASED_OUTPATIENT_CLINIC_OR_DEPARTMENT_OTHER)
Admission: EM | Admit: 2018-01-04 | Discharge: 2018-01-06 | DRG: 392 | Disposition: A | Discharge status: Home / Self Care | Payer: Medicare Other | Attending: Internal Medicine | Admitting: Internal Medicine

## 2018-01-04 DIAGNOSIS — K5790 Diverticulosis of intestine, part unspecified, without perforation or abscess without bleeding: Secondary | ICD-10-CM | POA: Diagnosis present

## 2018-01-04 DIAGNOSIS — F17210 Nicotine dependence, cigarettes, uncomplicated: Secondary | ICD-10-CM | POA: Diagnosis present

## 2018-01-04 DIAGNOSIS — F419 Anxiety disorder, unspecified: Secondary | ICD-10-CM | POA: Diagnosis present

## 2018-01-04 DIAGNOSIS — A09 Infectious gastroenteritis and colitis, unspecified: Secondary | ICD-10-CM | POA: Diagnosis present

## 2018-01-04 DIAGNOSIS — K922 Gastrointestinal hemorrhage, unspecified: Secondary | ICD-10-CM | POA: Diagnosis present

## 2018-01-04 DIAGNOSIS — R0902 Hypoxemia: Secondary | ICD-10-CM

## 2018-01-04 DIAGNOSIS — I10 Essential (primary) hypertension: Secondary | ICD-10-CM | POA: Diagnosis present

## 2018-01-04 DIAGNOSIS — M160 Bilateral primary osteoarthritis of hip: Secondary | ICD-10-CM | POA: Diagnosis present

## 2018-01-04 DIAGNOSIS — K219 Gastro-esophageal reflux disease without esophagitis: Secondary | ICD-10-CM | POA: Diagnosis not present

## 2018-01-04 DIAGNOSIS — Z9841 Cataract extraction status, right eye: Secondary | ICD-10-CM

## 2018-01-04 DIAGNOSIS — K559 Vascular disorder of intestine, unspecified: Secondary | ICD-10-CM | POA: Diagnosis not present

## 2018-01-04 DIAGNOSIS — R935 Abnormal findings on diagnostic imaging of other abdominal regions, including retroperitoneum: Secondary | ICD-10-CM | POA: Diagnosis not present

## 2018-01-04 DIAGNOSIS — Z86010 Personal history of colonic polyps: Secondary | ICD-10-CM

## 2018-01-04 DIAGNOSIS — Z880 Allergy status to penicillin: Secondary | ICD-10-CM

## 2018-01-04 DIAGNOSIS — Z800 Family history of malignant neoplasm of digestive organs: Secondary | ICD-10-CM | POA: Diagnosis not present

## 2018-01-04 DIAGNOSIS — Z881 Allergy status to other antibiotic agents status: Secondary | ICD-10-CM

## 2018-01-04 DIAGNOSIS — K529 Noninfective gastroenteritis and colitis, unspecified: Secondary | ICD-10-CM | POA: Diagnosis present

## 2018-01-04 DIAGNOSIS — R109 Unspecified abdominal pain: Secondary | ICD-10-CM | POA: Diagnosis not present

## 2018-01-04 DIAGNOSIS — E785 Hyperlipidemia, unspecified: Secondary | ICD-10-CM | POA: Diagnosis present

## 2018-01-04 DIAGNOSIS — Z888 Allergy status to other drugs, medicaments and biological substances status: Secondary | ICD-10-CM

## 2018-01-04 DIAGNOSIS — K921 Melena: Secondary | ICD-10-CM | POA: Diagnosis not present

## 2018-01-04 DIAGNOSIS — K625 Hemorrhage of anus and rectum: Secondary | ICD-10-CM | POA: Diagnosis present

## 2018-01-04 DIAGNOSIS — Z882 Allergy status to sulfonamides status: Secondary | ICD-10-CM

## 2018-01-04 DIAGNOSIS — R05 Cough: Secondary | ICD-10-CM | POA: Diagnosis not present

## 2018-01-04 DIAGNOSIS — Z9842 Cataract extraction status, left eye: Secondary | ICD-10-CM

## 2018-01-04 DIAGNOSIS — R1084 Generalized abdominal pain: Secondary | ICD-10-CM | POA: Diagnosis not present

## 2018-01-04 LAB — CBC WITH DIFFERENTIAL/PLATELET
Basophils Absolute: 0 10*3/uL (ref 0.0–0.1)
Basophils Relative: 0 %
EOS PCT: 0 %
Eosinophils Absolute: 0 10*3/uL (ref 0.0–0.7)
HCT: 46.1 % — ABNORMAL HIGH (ref 36.0–46.0)
Hemoglobin: 15.5 g/dL — ABNORMAL HIGH (ref 12.0–15.0)
LYMPHS ABS: 1.5 10*3/uL (ref 0.7–4.0)
LYMPHS PCT: 16 %
MCH: 33.4 pg (ref 26.0–34.0)
MCHC: 33.6 g/dL (ref 30.0–36.0)
MCV: 99.4 fL (ref 78.0–100.0)
MONO ABS: 1.1 10*3/uL — AB (ref 0.1–1.0)
Monocytes Relative: 11 %
Neutro Abs: 6.7 10*3/uL (ref 1.7–7.7)
Neutrophils Relative %: 73 %
PLATELETS: 230 10*3/uL (ref 150–400)
RBC: 4.64 MIL/uL (ref 3.87–5.11)
RDW: 13.9 % (ref 11.5–15.5)
WBC: 9.3 10*3/uL (ref 4.0–10.5)

## 2018-01-04 LAB — COMPREHENSIVE METABOLIC PANEL
ALT: 10 U/L — ABNORMAL LOW (ref 14–54)
ANION GAP: 11 (ref 5–15)
AST: 17 U/L (ref 15–41)
Albumin: 3.9 g/dL (ref 3.5–5.0)
Alkaline Phosphatase: 56 U/L (ref 38–126)
BUN: 17 mg/dL (ref 6–20)
CHLORIDE: 104 mmol/L (ref 101–111)
CO2: 26 mmol/L (ref 22–32)
Calcium: 9.1 mg/dL (ref 8.9–10.3)
Creatinine, Ser: 0.7 mg/dL (ref 0.44–1.00)
Glucose, Bld: 105 mg/dL — ABNORMAL HIGH (ref 65–99)
POTASSIUM: 4.1 mmol/L (ref 3.5–5.1)
Sodium: 141 mmol/L (ref 135–145)
Total Bilirubin: 0.4 mg/dL (ref 0.3–1.2)
Total Protein: 7.8 g/dL (ref 6.5–8.1)

## 2018-01-04 LAB — URINALYSIS, ROUTINE W REFLEX MICROSCOPIC
Bacteria, UA: NONE SEEN
Bilirubin Urine: NEGATIVE
GLUCOSE, UA: NEGATIVE mg/dL
Ketones, ur: 5 mg/dL — AB
NITRITE: NEGATIVE
PROTEIN: NEGATIVE mg/dL
Specific Gravity, Urine: >1.046 — ABNORMAL HIGH (ref 1.005–1.030)
pH: 5 (ref 5.0–8.0)

## 2018-01-04 LAB — PROTIME-INR
INR: 0.91
Prothrombin Time: 12.2 seconds (ref 11.4–15.2)

## 2018-01-04 LAB — TROPONIN I: Troponin I: <0.03 ng/mL (ref <0.03)

## 2018-01-04 MED ORDER — ACETAMINOPHEN 650 MG RE SUPP: 650.0000 mg | Freq: Four times a day (QID) | RECTAL | Status: DC | PRN

## 2018-01-04 MED ORDER — SODIUM CHLORIDE 0.9 % IV BOLUS (SEPSIS)
500.0000 mL | Freq: Once | INTRAVENOUS | Status: AC
  Administered 2018-01-04: 500 mL via INTRAVENOUS

## 2018-01-04 MED ORDER — CIPROFLOXACIN IN D5W 400 MG/200ML IV SOLN
400.0000 mg | Freq: Once | INTRAVENOUS | Status: AC
  Administered 2018-01-04: 400 mg via INTRAVENOUS
  Filled 2018-01-04: qty 200

## 2018-01-04 MED ORDER — ONDANSETRON HCL 4 MG/2ML IJ SOLN
4.0000 mg | Freq: Four times a day (QID) | INTRAMUSCULAR | Status: DC | PRN
  Administered 2018-01-05: 4 mg via INTRAVENOUS
  Filled 2018-01-04 (×2): qty 2

## 2018-01-04 MED ORDER — ONDANSETRON HCL 4 MG PO TABS: 4.0000 mg | ORAL_TABLET | Freq: Four times a day (QID) | ORAL | Status: DC | PRN

## 2018-01-04 MED ORDER — ACETAMINOPHEN 325 MG PO TABS
650.0000 mg | ORAL_TABLET | Freq: Four times a day (QID) | ORAL | Status: DC | PRN
  Administered 2018-01-05: 650 mg via ORAL
  Filled 2018-01-04: qty 2

## 2018-01-04 MED ORDER — IOPAMIDOL (ISOVUE-300) INJECTION 61%
100.0000 mL | Freq: Once | INTRAVENOUS | Status: AC | PRN
  Administered 2018-01-04: 100 mL via INTRAVENOUS

## 2018-01-04 MED ORDER — ONDANSETRON HCL 4 MG/2ML IJ SOLN
4.0000 mg | Freq: Once | INTRAMUSCULAR | Status: DC
  Filled 2018-01-04 (×2): qty 2

## 2018-01-04 MED ORDER — CIPROFLOXACIN IN D5W 400 MG/200ML IV SOLN
400.0000 mg | Freq: Two times a day (BID) | INTRAVENOUS | Status: DC
  Administered 2018-01-05 – 2018-01-06 (×3): 400 mg via INTRAVENOUS
  Filled 2018-01-04 (×2): qty 200

## 2018-01-04 MED ORDER — POTASSIUM CHLORIDE IN NACL 20-0.9 MEQ/L-% IV SOLN
INTRAVENOUS | Status: DC
  Administered 2018-01-04 – 2018-01-06 (×3): via INTRAVENOUS
  Filled 2018-01-04 (×4): qty 1000

## 2018-01-04 MED ORDER — METRONIDAZOLE IN NACL 5-0.79 MG/ML-% IV SOLN
500.0000 mg | Freq: Three times a day (TID) | INTRAVENOUS | Status: DC
  Administered 2018-01-05 – 2018-01-06 (×4): 500 mg via INTRAVENOUS
  Filled 2018-01-04 (×5): qty 100

## 2018-01-04 MED ORDER — METRONIDAZOLE IN NACL 5-0.79 MG/ML-% IV SOLN
500.0000 mg | Freq: Once | INTRAVENOUS | Status: AC
  Administered 2018-01-04: 500 mg via INTRAVENOUS
  Filled 2018-01-04: qty 100

## 2018-01-04 MED ORDER — POLYETHYLENE GLYCOL 3350 17 G PO PACK: 17.0000 g | PACK | Freq: Every day | ORAL | Status: DC | PRN

## 2018-01-04 NOTE — Progress Notes (Signed)
Pharmacy Antibiotic Note [place] A Cousineau is a 82 y.o. female admitted on 01/04/2018 with diverticulitis. Pharmacy has been consulted for ciprofloxacin dosing.  Plan:  1. Ciprofloxacin 400 mg IV every 12 hours  Height: 5\' 1"  (154.9 cm) Weight: 110 lb (49.9 kg) IBW/kg (Calculated) : 47.8  Temp (24hrs), Avg:98.3 F (36.8 C), Min:98.3 F (36.8 C), Max:98.3 F (36.8 C)  Recent Labs  Lab 01/04/18 1704  WBC 9.3  CREATININE 0.70    Estimated Creatinine Clearance: 43 mL/min (by C-G formula based on SCr of 0.7 mg/dL).    Allergies  Allergen Reactions  . Ciprofloxacin Nausea Only    Nausea and legs weak  . Nitrofuran Derivatives Nausea Only    nitrofuratonin bad nausea, legs weak  . Sulfa Antibiotics     jittery   Thank you for allowing pharmacy to be a part of this patient's care.  Vincenza Hews, PharmD, BCPS 01/04/2018, 7:37 PM

## 2018-01-04 NOTE — ED Notes (Addendum)
Pt reports BM x 2 today. First was BM mixed with blood. Second was only blood. Pt reports pain increased during BM. States she has Hx of diverticulitis

## 2018-01-04 NOTE — ED Triage Notes (Signed)
Reports abdominal cramping with bright red rectal bleeding that started this morning. Also reports cough x 1 week.

## 2018-01-04 NOTE — ED Notes (Signed)
ED Provider at bedside. 

## 2018-01-04 NOTE — Progress Notes (Addendum)
RN was given report from [employer] at [employer] [facility] at 2030. Patient arrived by EMS to the floor at 2100, daughter is at the bedside.

## 2018-01-04 NOTE — ED Provider Notes (Signed)
[facility] Provider Note   CSN: [id number] Arrival date & time: 01/04/18  1523     History   Chief Complaint Chief Complaint  Patient presents with  . Rectal Bleeding  . Cough    HPI Carol [name] is a 82 y.o. female.  HPI  82 year old female with 6 episodes of blood per rectum today.  Patient reports that she had intense pain this morning and then had a bunch of rectal bleeding.  She is reports every time she is gone since then she is had a large amount of rectal bleeding.  Patient has history of diverticulosis.  She reports pain in the left lower quadrant.  No vomiting.  Not on any blood thinners.  Past Medical History:  Diagnosis Date  . Anxiety   . Arthritis    arthritis -hips  . Complication of anesthesia    B/P dropped post colonoscopy x1  . GERD (gastroesophageal reflux disease)   . Headache(784.0)    migraines rare occ.  Marland Kitchen Hypertension   . Urinary tract infection early aug 2014    Patient Active Problem List   Diagnosis Date Noted  . Colitis 01/04/2018  . Rectal bleeding 01/04/2018  . GERD (gastroesophageal reflux disease) 01/04/2018    Past Surgical History:  Procedure Laterality Date  . CATARACT EXTRACTION, BILATERAL Bilateral   . COLONOSCOPY WITH PROPOFOL N/A 08/03/2013   Procedure: COLONOSCOPY WITH PROPOFOL;  Surgeon: Winfield Cunas., MD;  Location: [facility];  Service: Endoscopy;  Laterality: N/A;  ultra thin colon scope  . LASIK      OB History    No data available       Home Medications    Prior to Admission medications   Medication Sig Start Date End Date Taking? Authorizing Provider  acetaminophen (TYLENOL) 650 MG CR tablet Take 1,300 mg by mouth every 8 (eight) hours as needed for pain.   Yes [provider]  ALPRAZolam (XANAX) 0.25 MG tablet Take 0.25 mg by mouth 2 (two) times daily as needed for anxiety.   Yes [provider]  amLODipine (NORVASC) 5 MG tablet Take  5 mg by mouth daily. 11/14/17  Yes [provider]  benazepril (LOTENSIN) 20 MG tablet Take 20 mg by mouth daily. 11/27/17  Yes [provider]  Calcium Carbonate-Vit D-Min (CALTRATE 600+D PLUS PO) Take 1 tablet by mouth daily.   Yes [provider]  escitalopram (LEXAPRO) 10 MG tablet Take 10 mg by mouth daily.   Yes [provider]  fluticasone (FLONASE) 50 MCG/ACT nasal spray Place 1 spray into both nostrils daily as needed for allergies or rhinitis.   Yes [provider]  MAGNESIUM CARBONATE PO Take 1 tablet by mouth daily.   Yes [provider]  Multiple Vitamin (MULTIVITAMIN WITH MINERALS) TABS tablet Take 1 tablet by mouth daily.   Yes [provider]  pantoprazole (PROTONIX) 40 MG tablet Take 40 mg by mouth daily. 10/28/17  Yes [provider]  polycarbophil (FIBERCON) 625 MG tablet Take 625 mg by mouth daily.   Yes [provider]  Probiotic Product (ALIGN PO) Take 1 tablet by mouth daily.   Yes [provider]  raloxifene (EVISTA) 60 MG tablet Take 60 mg by mouth daily. 12/30/17  Yes [provider]  simvastatin (ZOCOR) 20 MG tablet Take 20 mg by mouth daily.   Yes [provider]    Family History History reviewed. No pertinent family history.  Social History Social History   Tobacco Use  . Smoking status: Current Every Day Smoker    Packs/day: 1.00    Years: 40.00    Pack years: 40.00    Types: Cigarettes  . Smokeless tobacco: Never Used  Substance Use Topics  . Alcohol use: No  . Drug use: No     Allergies   Ciprofloxacin; Nitrofuran derivatives; and Sulfa antibiotics   Review of Systems Review of Systems  Constitutional: Negative for activity change.  Respiratory: Negative for shortness of breath.   Cardiovascular: Negative for chest pain.  Gastrointestinal: Positive for abdominal pain and rectal pain.  All other systems reviewed and are  negative.    Physical Exam Updated Vital Signs BP 134/67 (BP Location: Right Arm)   Pulse 96   Temp 99 F (37.2 C) (Oral)   Resp (!) 22   Ht 5\' 1"  (1.549 m)   Wt 49.9 kg (110 lb)   SpO2 96%   BMI 20.78 kg/m   Physical Exam  Constitutional: She is oriented to person, place, and time. She appears well-developed and well-nourished.  HENT:  Head: Normocephalic and atraumatic.  Eyes: Right eye exhibits no discharge. Left eye exhibits no discharge.  Cardiovascular: Regular rhythm and normal heart sounds.  No murmur heard. Tacky cardia  Pulmonary/Chest: Effort normal and breath sounds normal. She has no wheezes. She has no rales.  Abdominal: Soft. She exhibits no distension. There is tenderness.  Eft lower quadrant tenderness.  Neurological: She is oriented to person, place, and time.  Skin: Skin is warm and dry. She is not diaphoretic.  Psychiatric: She has a normal mood and affect.  Nursing note and vitals reviewed.    ED Treatments / Results  Labs (all labs ordered are listed, but only abnormal results are displayed) Labs Reviewed  CBC WITH DIFFERENTIAL/PLATELET - Abnormal; Notable for the following components:      Result Value   Hemoglobin 15.5 (*)    HCT 46.1 (*)    Monocytes Absolute 1.1 (*)    All other components within normal limits  COMPREHENSIVE METABOLIC PANEL - Abnormal; Notable for the following components:   Glucose, Bld 105 (*)    ALT 10 (*)    All other components within normal limits  URINALYSIS, ROUTINE W REFLEX MICROSCOPIC - Abnormal; Notable for the following components:   Specific Gravity, Urine >1.046 (*)    Hgb urine dipstick SMALL (*)    Ketones, ur 5 (*)    Leukocytes, UA TRACE (*)    Squamous Epithelial / LPF 6-30 (*)    All other components within normal limits  PROTIME-INR  TROPONIN I  BASIC METABOLIC PANEL    EKG  EKG Interpretation None       Radiology Dg Chest 2 View  Result Date: 01/04/2018 CLINICAL DATA:  Cough,  congestion for 1 week. EXAM: CHEST  2 VIEW COMPARISON:  None. FINDINGS: There is hyperinflation of the lungs compatible with COPD. Heart and mediastinal contours are within normal limits. No focal opacities or effusions. No acute bony abnormality. IMPRESSION: COPD.  No active disease. Electronically Signed   By: Rolm Baptise M.D.   On: 01/04/2018 16:01   Ct Abdomen Pelvis W Contrast  Result Date: 01/04/2018 CLINICAL DATA:  Bowel movement with pain since 3 this morning. Bloody bowel movements EXAM: CT ABDOMEN AND PELVIS WITH CONTRAST TECHNIQUE: Multidetector CT imaging of the abdomen and pelvis was performed using the standard protocol following bolus administration of intravenous contrast. CONTRAST:  186mL  ISOVUE-300 IOPAMIDOL (ISOVUE-300) INJECTION 61% COMPARISON:  None. FINDINGS: Lower chest: Lung bases are clear. Hepatobiliary: No focal hepatic lesion. No biliary duct dilatation. Gallbladder is normal. Common bile duct is normal. Pancreas: Pancreas is normal. No ductal dilatation. No pancreatic inflammation. Spleen: Normal spleen Adrenals/urinary tract: Adrenal glands and kidneys are normal. The ureters and bladder normal. Stomach/Bowel: Stomach, small-bowel, cecum and appendix normal. The ascending colon is normal. The transverse colon is normal. Beginning in the descending colon there is a long segment submucosal edema measuring approximately 12 cm in length (image 50, series 5). There is mucosal enhancement through this segment. Mild stranding along the LEFT pericolic gutter. The sigmoid colon is normal with scattered diverticula. Rectum normal. There is no evidence of perforation. No diverticular disease in this region. The mesenteric vessels appear patent through this region of descending colon. The SMA and celiac trunk are patent. The IMA is patent. There is ostial calcifications at the origin of the celiac trunk and SMA but no evidence of occlusion. Vascular/Lymphatic: Abdominal aorta is normal caliber  with atherosclerotic calcification. There is no retroperitoneal or periportal lymphadenopathy. No pelvic lymphadenopathy. Reproductive: Uterus normal Other: No free fluid. Musculoskeletal: No aggressive osseous lesion. IMPRESSION: 1. 1. Segmental colitis of the descending colon with differential including ischemic colitis, inflammatory bowel disease, infectious colitis, and drug induced colitis. The arterial and venous vessels of the LEFT colon appear patent. 2. No free air or free fluid. 3.  Aortic Atherosclerosis (ICD10-I70.0). Electronically Signed   By: Suzy Bouchard M.D.   On: 01/04/2018 18:19    Procedures Procedures (including critical care time)  Medications Ordered in ED Medications  ondansetron (ZOFRAN) injection 4 mg (4 mg Intravenous Not Given 01/04/18 2004)  0.9 % NaCl with KCl 20 mEq/ L  infusion ( Intravenous New Bag/Given 01/04/18 2237)  acetaminophen (TYLENOL) tablet 650 mg (not administered)    Or  acetaminophen (TYLENOL) suppository 650 mg (not administered)  polyethylene glycol (MIRALAX / GLYCOLAX) packet 17 g (not administered)  ondansetron (ZOFRAN) tablet 4 mg (not administered)    Or  ondansetron (ZOFRAN) injection 4 mg (not administered)  metroNIDAZOLE (FLAGYL) IVPB 500 mg (not administered)  ciprofloxacin (CIPRO) IVPB 400 mg (not administered)  iopamidol (ISOVUE-300) 61 % injection 100 mL (100 mLs Intravenous Contrast Given 01/04/18 1755)  metroNIDAZOLE (FLAGYL) IVPB 500 mg (500 mg Intravenous Transfusing/Transfer 01/04/18 2020)  ciprofloxacin (CIPRO) IVPB 400 mg (0 mg Intravenous Stopped 01/04/18 2018)  sodium chloride 0.9 % bolus 500 mL (500 mLs Intravenous Transfusing/Transfer 01/04/18 1940)     Initial Impression / Assessment and Plan / ED Course  I have reviewed the triage vital signs and the nursing notes.  Pertinent labs & imaging results that were available during my care of the patient were reviewed by me and considered in my medical decision making (see chart  for details).     82 year old female presenting with 6 episodes of blood per rectum and tachycardia.  Patient has history of diverticulitis with pain left lower quadrant.  Suspect diverticular bleed.  Will do CT, admit.  Stable vitals, CT shows Left sided colitis.   Discussed with GI, will see her in the monring. Discussed with hosptilist, they will admit.   Final Clinical Impressions(s) / ED Diagnoses   Final diagnoses:  None    ED Discharge Orders    None       Macarthur Critchley, MD 01/04/18 2342

## 2018-01-04 NOTE — H&P (Signed)
PCP:   Shirline Frees, MD   Chief Complaint:  BRBPR   HPI: this is a 82 y/o female with h/o diverticulosis, who has a colonoscopy every 5 years. Last colonoscopy 2 years ago showing diverticulosis. Last night around 3 AM she developed severe abdominal cramps, she had diarrhea BM. 4 BMs later she started seeing BRBPR. This has persisted. She finally went to [facility], where she was transferred to [facility].  She describes the pain as crampy and intermittent, relieved with BM. At its worse it is 9/10, currently 0/10. He denies fevers, chills, nausea or vomiting. She does has episodes of diverticular bleed and diverticulitis but states this was different.  History provided by the patient and many family members present at bedside. The patient has cipro listed as an allergy by its mainly GI upset. She has tolerated the IV cipro without incident.  Review of Systems:  The patient denies anorexia, fever, weight loss, diaphoretic with BM, vision loss, decreased hearing, hoarseness, chest pain, syncope, dyspnea on exertion, peripheral edema, balance deficits, hemoptysis, abdominal pain, BRBPR, melena, hematochezia, severe indigestion/heartburn, hematuria, incontinence, genital sores, muscle weakness, suspicious skin lesions, transient blindness, difficulty walking, depression, unusual weight change, abnormal bleeding, enlarged lymph nodes, angioedema, and breast masses.  Past Medical History: Past Medical History:  Diagnosis Date  . Anxiety   . Arthritis    arthritis -hips  . Complication of anesthesia    B/P dropped post colonoscopy x1  . GERD (gastroesophageal reflux disease)   . Headache(784.0)    migraines rare occ.  Marland Kitchen Hypertension   . Urinary tract infection early aug 2014   Past Surgical History:  Procedure Laterality Date  . CATARACT EXTRACTION, BILATERAL Bilateral   . COLONOSCOPY WITH PROPOFOL N/A 08/03/2013   Procedure: COLONOSCOPY WITH PROPOFOL;  Surgeon: Winfield Cunas., MD;  Location: [facility];  Service: Endoscopy;  Laterality: N/A;  ultra thin colon scope  . LASIK      Medications: Prior to Admission medications   Medication Sig Start Date End Date Taking? Authorizing Provider  acetaminophen (TYLENOL) 650 MG CR tablet Take 1,300 mg by mouth every 8 (eight) hours as needed for pain.   Yes [provider]  ALPRAZolam (XANAX) 0.25 MG tablet Take 0.25 mg by mouth 2 (two) times daily as needed for anxiety.   Yes [provider]  amLODipine (NORVASC) 5 MG tablet Take 5 mg by mouth daily. 11/14/17  Yes [provider]  benazepril (LOTENSIN) 20 MG tablet Take 20 mg by mouth daily. 11/27/17  Yes [provider]  Calcium Carbonate-Vit D-Min (CALTRATE 600+D PLUS PO) Take 1 tablet by mouth daily.   Yes [provider]  escitalopram (LEXAPRO) 10 MG tablet Take 10 mg by mouth daily.   Yes [provider]  fluticasone (FLONASE) 50 MCG/ACT nasal spray Place 1 spray into both nostrils daily as needed for allergies or rhinitis.   Yes [provider]  MAGNESIUM CARBONATE PO Take 1 tablet by mouth daily.   Yes [provider]  Multiple Vitamin (MULTIVITAMIN WITH MINERALS) TABS tablet Take 1 tablet by mouth daily.   Yes [provider]  pantoprazole (PROTONIX) 40 MG tablet Take 40 mg by mouth daily. 10/28/17  Yes [provider]  polycarbophil (FIBERCON) 625 MG tablet Take 625 mg by mouth daily.   Yes [provider]  Probiotic Product (ALIGN PO) Take 1 tablet by mouth daily.   Yes [provider]  raloxifene (EVISTA) 60 MG tablet Take 60 mg  by mouth daily. 12/30/17  Yes [provider]  simvastatin (ZOCOR) 20 MG tablet Take 20 mg by mouth daily.   Yes [provider]    Allergies:   Allergies  Allergen Reactions  . Ciprofloxacin Nausea Only    Nausea and legs weak  . Nitrofuran Derivatives Nausea Only    nitrofuratonin bad nausea, legs weak  . Sulfa Antibiotics      jittery    Social History:  reports that she has been smoking cigarettes.  She has a 40.00 pack-year smoking history. she has never used smokeless tobacco. She reports that she does not drink alcohol or use drugs.  Family History: History reviewed. No pertinent family history.  Physical Exam: Vitals:   01/04/18 1900 01/04/18 1930 01/04/18 2000 01/04/18 2120  BP: 123/65 109/84 110/60 134/67  Pulse: 94 (!) 104 96 96  Resp: (!) 23 18 (!) 29 (!) 22  Temp:    99 F (37.2 C)  TempSrc:    Oral  SpO2: 92% 93% 95% 96%  Weight:      Height:        General:  Alert and oriented times three, well developed and nourished, no acute distress Eyes: PERRLA, pink conjunctiva, no scleral icterus ENT: Moist oral mucosa, neck supple, no thyromegaly Lungs: clear to ascultation, no wheeze, no crackles, no use of accessory muscles Cardiovascular: regular rate and rhythm, no regurgitation, no gallops, no murmurs. No carotid bruits, no JVD Abdomen: soft, positive BS, mild TTP LLQ, non-distended, no organomegaly, not an acute abdomen GU: not examined Neuro: CN II - XII grossly intact, sensation intact Musculoskeletal: strength 5/5 all extremities, no clubbing, cyanosis or edema Skin: no rash, no subcutaneous crepitation, no decubitus Psych: appropriate patient   Labs on Admission:  Recent Labs    01/04/18 1704  NA 141  K 4.1  CL 104  CO2 26  GLUCOSE 105*  BUN 17  CREATININE 0.70  CALCIUM 9.1   Recent Labs    01/04/18 1704  AST 17  ALT 10*  ALKPHOS 56  BILITOT 0.4  PROT 7.8  ALBUMIN 3.9   No results for input(s): LIPASE, AMYLASE in the last 72 hours. Recent Labs    01/04/18 1704  WBC 9.3  NEUTROABS 6.7  HGB 15.5*  HCT 46.1*  MCV 99.4  PLT 230   Recent Labs    01/04/18 1704  TROPONINI <0.03   Invalid input(s): POCBNP No results for input(s): DDIMER in the last 72 hours. No results for input(s): HGBA1C in the last 72 hours. No results for input(s): CHOL, HDL, LDLCALC,  TRIG, CHOLHDL, LDLDIRECT in the last 72 hours. No results for input(s): TSH, T4TOTAL, T3FREE, THYROIDAB in the last 72 hours.  Invalid input(s): FREET3 No results for input(s): VITAMINB12, FOLATE, FERRITIN, TIBC, IRON, RETICCTPCT in the last 72 hours.  Micro Results: No results found for this or any previous visit (from the past 240 hour(s)).   Radiological Exams on Admission: Dg Chest 2 View  Result Date: 01/04/2018 CLINICAL DATA:  Cough, congestion for 1 week. EXAM: CHEST  2 VIEW COMPARISON:  None. FINDINGS: There is hyperinflation of the lungs compatible with COPD. Heart and mediastinal contours are within normal limits. No focal opacities or effusions. No acute bony abnormality. IMPRESSION: COPD.  No active disease. Electronically Signed   By: Rolm Baptise M.D.   On: 01/04/2018 16:01   Ct Abdomen Pelvis W Contrast  Result Date: 01/04/2018 CLINICAL DATA:  Bowel movement with pain since 3 this morning.  Bloody bowel movements EXAM: CT ABDOMEN AND PELVIS WITH CONTRAST TECHNIQUE: Multidetector CT imaging of the abdomen and pelvis was performed using the standard protocol following bolus administration of intravenous contrast. CONTRAST:  198mL ISOVUE-300 IOPAMIDOL (ISOVUE-300) INJECTION 61% COMPARISON:  None. FINDINGS: Lower chest: Lung bases are clear. Hepatobiliary: No focal hepatic lesion. No biliary duct dilatation. Gallbladder is normal. Common bile duct is normal. Pancreas: Pancreas is normal. No ductal dilatation. No pancreatic inflammation. Spleen: Normal spleen Adrenals/urinary tract: Adrenal glands and kidneys are normal. The ureters and bladder normal. Stomach/Bowel: Stomach, small-bowel, cecum and appendix normal. The ascending colon is normal. The transverse colon is normal. Beginning in the descending colon there is a long segment submucosal edema measuring approximately 12 cm in length (image 50, series 5). There is mucosal enhancement through this segment. Mild stranding along the LEFT  pericolic gutter. The sigmoid colon is normal with scattered diverticula. Rectum normal. There is no evidence of perforation. No diverticular disease in this region. The mesenteric vessels appear patent through this region of descending colon. The SMA and celiac trunk are patent. The IMA is patent. There is ostial calcifications at the origin of the celiac trunk and SMA but no evidence of occlusion. Vascular/Lymphatic: Abdominal aorta is normal caliber with atherosclerotic calcification. There is no retroperitoneal or periportal lymphadenopathy. No pelvic lymphadenopathy. Reproductive: Uterus normal Other: No free fluid. Musculoskeletal: No aggressive osseous lesion. IMPRESSION: 1. 1. Segmental colitis of the descending colon with differential including ischemic colitis, inflammatory bowel disease, infectious colitis, and drug induced colitis. The arterial and venous vessels of the LEFT colon appear patent. 2. No free air or free fluid. 3.  Aortic Atherosclerosis (ICD10-I70.0). Electronically Signed   By: Suzy Bouchard M.D.   On: 01/04/2018 18:19    Assessment/Plan Present on Admission: . Colitis . Rectal bleeding -admit to medsurg -cipro and flagyl IV -IVF hydration -Ice chips -GI consult in AM  HTN -stable, home meds resumed  Tobacco use -nicotine patch, duonebs PRN   Heberto Sturdevant 01/04/2018, 9:51 PM

## 2018-01-05 ENCOUNTER — Inpatient Hospital Stay (HOSPITAL_COMMUNITY): Payer: Medicare Other

## 2018-01-05 LAB — BASIC METABOLIC PANEL
Anion gap: 5 (ref 5–15)
BUN: 14 mg/dL (ref 6–20)
CHLORIDE: 108 mmol/L (ref 101–111)
CO2: 27 mmol/L (ref 22–32)
CREATININE: 0.58 mg/dL (ref 0.44–1.00)
Calcium: 8.4 mg/dL — ABNORMAL LOW (ref 8.9–10.3)
GFR calc Af Amer: >60 mL/min (ref >60)
GFR calc non Af Amer: >60 mL/min (ref >60)
GLUCOSE: 99 mg/dL (ref 65–99)
POTASSIUM: 3.8 mmol/L (ref 3.5–5.1)
Sodium: 140 mmol/L (ref 135–145)

## 2018-01-05 MED ORDER — PANTOPRAZOLE SODIUM 40 MG PO TBEC
40.0000 mg | DELAYED_RELEASE_TABLET | Freq: Every day | ORAL | Status: DC
  Administered 2018-01-05 – 2018-01-06 (×2): 40 mg via ORAL
  Filled 2018-01-05 (×2): qty 1

## 2018-01-05 MED ORDER — CALCIUM CARBONATE-VITAMIN D 500-200 MG-UNIT PO TABS
1.0000 | ORAL_TABLET | Freq: Every day | ORAL | Status: DC
  Administered 2018-01-05 – 2018-01-06 (×2): 1 via ORAL
  Filled 2018-01-05 (×2): qty 1

## 2018-01-05 MED ORDER — CALCIUM POLYCARBOPHIL 625 MG PO TABS
625.0000 mg | ORAL_TABLET | Freq: Every day | ORAL | Status: DC
Start: 2018-01-05 — End: 2018-01-06
  Administered 2018-01-05 – 2018-01-06 (×2): 625 mg via ORAL
  Filled 2018-01-05 (×2): qty 1

## 2018-01-05 MED ORDER — CALTRATE 600+D PLUS MINERALS 600-800 MG-UNIT PO CHEW: CHEWABLE_TABLET | Freq: Every day | ORAL | Status: DC

## 2018-01-05 MED ORDER — ADULT MULTIVITAMIN W/MINERALS CH
1.0000 | ORAL_TABLET | Freq: Every day | ORAL | Status: DC
  Administered 2018-01-05 – 2018-01-06 (×2): 1 via ORAL
  Filled 2018-01-05 (×2): qty 1

## 2018-01-05 MED ORDER — AMLODIPINE BESYLATE 5 MG PO TABS
5.0000 mg | ORAL_TABLET | Freq: Every day | ORAL | Status: DC
  Administered 2018-01-06: 5 mg via ORAL
  Filled 2018-01-05 (×2): qty 1

## 2018-01-05 MED ORDER — SIMVASTATIN 20 MG PO TABS
20.0000 mg | ORAL_TABLET | Freq: Every day | ORAL | Status: DC
Start: 2018-01-05 — End: 2018-01-06
  Administered 2018-01-05 – 2018-01-06 (×2): 20 mg via ORAL
  Filled 2018-01-05 (×2): qty 1

## 2018-01-05 MED ORDER — BENAZEPRIL HCL 10 MG PO TABS
20.0000 mg | ORAL_TABLET | Freq: Every day | ORAL | Status: DC
  Administered 2018-01-06: 20 mg via ORAL
  Filled 2018-01-05 (×2): qty 2

## 2018-01-05 MED ORDER — RALOXIFENE HCL 60 MG PO TABS
60.0000 mg | ORAL_TABLET | Freq: Every day | ORAL | Status: DC
  Administered 2018-01-05 – 2018-01-06 (×2): 60 mg via ORAL
  Filled 2018-01-05 (×2): qty 1

## 2018-01-05 MED ORDER — NICOTINE 14 MG/24HR TD PT24
14.0000 mg | MEDICATED_PATCH | Freq: Every day | TRANSDERMAL | Status: DC
  Filled 2018-01-05 (×2): qty 1

## 2018-01-05 MED ORDER — ESCITALOPRAM OXALATE 10 MG PO TABS
10.0000 mg | ORAL_TABLET | Freq: Every day | ORAL | Status: DC
  Administered 2018-01-05 – 2018-01-06 (×2): 10 mg via ORAL
  Filled 2018-01-05 (×2): qty 1

## 2018-01-05 MED ORDER — FLUTICASONE PROPIONATE 50 MCG/ACT NA SUSP
1.0000 | Freq: Every day | NASAL | Status: DC | PRN
  Administered 2018-01-06: 1 via NASAL
  Filled 2018-01-05 (×2): qty 16

## 2018-01-05 MED ORDER — ALPRAZOLAM 0.25 MG PO TABS: 0.2500 mg | ORAL_TABLET | Freq: Two times a day (BID) | ORAL | Status: DC | PRN

## 2018-01-05 NOTE — Progress Notes (Signed)
RT and MD unable to obtain ABG- order will be cancelled.

## 2018-01-05 NOTE — Consult Note (Signed)
[facility] Consult  Referring Provider: Quintella Baton, MD Primary Care Physician:  Shirline Frees, MD Primary Gastroenterologist: [email or web address]  Reason for Consultation:  Left sided abdominal pain and rectal bleeding  HPI: Carol [name] is a 82 y.o. female was in her usual state of health until 1 day prior to presentation when she woke up at 3 AM with severe abdominal cramping followed by 4 episodes of bright red blood per rectum, moderate to large in amount, and mucus in stools. The abdominal pain was described as 10 out of 10 in intensity, non-radiating, not associated with fever, chills, rigors, nausea or vomiting. Patient states that the abdominal pain has now resolved, she had 1 episode of rectal bleeding in the ER, and a small amount of rectal bleeding today morning.  She was seen as an outpatient in December 2018 for possible mild diverticulitis and was given Vibramycin 100 mg twice a day(? Allergy to penicillin/metronidazole/sulfa/Cipro-all reported to cause DIARRHEA, no rash).  Patient had her last colonoscopy in 2014 due to an abnormal virtual colonoscopy and had tubular adenomas removed from ascending and descending colon. Repeat colonoscopy was advised in 5 years. She is known to have a very redundant colon with severe diverticulosis on the left side, requiring the use of an ultrathin scope. Colonoscopy in 2011 showed extensive diverticulosis and tubular adenoma was removed from hepatic flexure and rectosigmoid. Her sister had colon cancer and she has been advised to repeat colonoscopy every 5 years.  Patient has acid reflux and takes Protonix once a day with good control of symptoms. She denies difficulty swallowing or pain on swallowing. She denies recent travel, sick contacts or recent use of antibiotics. She denies use of NSAIDs or antiplatelets.   Past Medical History:  Diagnosis Date  . Anxiety   . Arthritis    arthritis -hips  . Complication of anesthesia     B/P dropped post colonoscopy x1  . GERD (gastroesophageal reflux disease)   . Headache(784.0)    migraines rare occ.  Marland Kitchen Hypertension   . Urinary tract infection early aug 2014    Past Surgical History:  Procedure Laterality Date  . CATARACT EXTRACTION, BILATERAL Bilateral   . COLONOSCOPY WITH PROPOFOL N/A 08/03/2013   Procedure: COLONOSCOPY WITH PROPOFOL;  Surgeon: Winfield Cunas., MD;  Location: [facility];  Service: Endoscopy;  Laterality: N/A;  ultra thin colon scope  . LASIK      Prior to Admission medications   Medication Sig Start Date End Date Taking? Authorizing Provider  acetaminophen (TYLENOL) 650 MG CR tablet Take 1,300 mg by mouth every 8 (eight) hours as needed for pain.   Yes [provider]  ALPRAZolam (XANAX) 0.25 MG tablet Take 0.25 mg by mouth 2 (two) times daily as needed for anxiety.   Yes [provider]  amLODipine (NORVASC) 5 MG tablet Take 5 mg by mouth daily. 11/14/17  Yes [provider]  benazepril (LOTENSIN) 20 MG tablet Take 20 mg by mouth daily. 11/27/17  Yes [provider]  Calcium Carbonate-Vit D-Min (CALTRATE 600+D PLUS PO) Take 1 tablet by mouth daily.   Yes [provider]  escitalopram (LEXAPRO) 10 MG tablet Take 10 mg by mouth daily.   Yes [provider]  fluticasone (FLONASE) 50 MCG/ACT nasal spray Place 1 spray into both nostrils daily as needed for allergies or rhinitis.   Yes [provider]  MAGNESIUM CARBONATE PO Take 1 tablet by mouth daily.   Yes [provider]  Multiple  Vitamin (MULTIVITAMIN WITH MINERALS) TABS tablet Take 1 tablet by mouth daily.   Yes [provider]  pantoprazole (PROTONIX) 40 MG tablet Take 40 mg by mouth daily. 10/28/17  Yes [provider]  polycarbophil (FIBERCON) 625 MG tablet Take 625 mg by mouth daily.   Yes [provider]  Probiotic Product (ALIGN PO) Take 1 tablet by mouth daily.   Yes [provider]  raloxifene (EVISTA) 60 MG tablet Take 60 mg by mouth daily. 12/30/17  Yes [provider]  simvastatin (ZOCOR) 20 MG tablet Take 20 mg by mouth daily.   Yes [provider]    Current Facility-Administered Medications  Medication Dose Route Frequency Provider Last Rate Last Dose  . 0.9 % NaCl with KCl 20 mEq/ L  infusion   Intravenous Continuous Quintella Baton, MD 75 mL/hr at 01/04/18 2237    . acetaminophen (TYLENOL) tablet 650 mg  650 mg Oral Q6H PRN Quintella Baton, MD   650 mg at 01/05/18 0059   Or  . acetaminophen (TYLENOL) suppository 650 mg  650 mg Rectal Q6H PRN Crosley, Debby, MD      . ciprofloxacin (CIPRO) IVPB 400 mg  400 mg Intravenous Q12H Quintella Baton, MD   Stopped at 01/05/18 0753  . metroNIDAZOLE (FLAGYL) IVPB 500 mg  500 mg Intravenous Q8H Quintella Baton, MD   Stopped at 01/05/18 0528  . nicotine (NICODERM CQ - dosed in mg/24 hours) patch 14 mg  14 mg Transdermal Daily Crosley, Debby, MD   14 mg at 01/05/18 1011  . ondansetron (ZOFRAN) injection 4 mg  4 mg Intravenous Once Mackuen, Courteney Lyn, MD      . ondansetron (ZOFRAN) tablet 4 mg  4 mg Oral Q6H PRN Crosley, Debby, MD       Or  . ondansetron (ZOFRAN) injection 4 mg  4 mg Intravenous Q6H PRN Claria Dice, Debby, MD   4 mg at 01/05/18 0634  . polyethylene glycol (MIRALAX / GLYCOLAX) packet 17 g  17 g Oral Daily PRN Quintella Baton, MD        Allergies as of 01/04/2018 - Review Complete 01/04/2018  Allergen Reaction Noted  . Ciprofloxacin Nausea Only 07/22/2013  . Nitrofuran derivatives Nausea Only 07/22/2013  . Sulfa antibiotics  07/03/2013    History reviewed. No pertinent family history.  Social History   Socioeconomic History  . Marital status: Widowed    Spouse name: Not on file  . Number of children: Not on file  . Years of education: Not on file  . Highest education level: Not on file  Social Needs  . Financial resource strain: Not on file  . Food insecurity - worry: Not on file  .  Food insecurity - inability: Not on file  . Transportation needs - medical: Not on file  . Transportation needs - non-medical: Not on file  Occupational History  . Not on file  Tobacco Use  . Smoking status: Current Every Day Smoker    Packs/day: 1.00    Years: 40.00    Pack years: 40.00    Types: Cigarettes  . Smokeless tobacco: Never Used  Substance and Sexual Activity  . Alcohol use: No  . Drug use: No  . Sexual activity: Not Currently  Other Topics Concern  . Not on file  Social History Narrative  . Not on file    Review of Systems: Positive for: GI: Described in detail in HPI.    Gen: Denies any fever, chills, rigors, night sweats,  anorexia, fatigue, weakness, malaise, involuntary weight loss, and sleep disorder CV: Denies chest pain, angina, palpitations, syncope, orthopnea, PND, peripheral edema, and claudication. Resp: Denies dyspnea, cough, sputum, wheezing, coughing up blood. GU : Denies urinary burning, blood in urine, urinary frequency, urinary hesitancy, nocturnal urination, and urinary incontinence. MS: Denies joint pain or swelling.  Denies muscle weakness, cramps, atrophy.  Derm: Denies rash, itching, oral ulcerations, hives, unhealing ulcers.  Psych: depression, anxiety,Denies , memory loss, suicidal ideation, hallucinations,  and confusion. Heme:  Rectal bleeding, Denies bruising and enlarged lymph nodes. Neuro:  Denies any headaches, dizziness, paresthesias. Endo:  Denies any problems with DM, thyroid, adrenal function.  Physical Exam: Vital signs in last 24 hours: Temp:  [98.3 F (36.8 C)-99 F (37.2 C)] 98.6 F (37 C) (02/10 0556) Pulse Rate:  [80-104] 80 (02/10 0556) Resp:  [18-33] 18 (02/10 0556) BP: (107-134)/(49-84) 107/49 (02/10 0556) SpO2:  [89 %-96 %] 91 % (02/10 0556) Weight:  [49.9 kg (110 lb)] 49.9 kg (110 lb) (02/09 1539) Last BM Date: 01/04/18  General:   Alert,  Well-developed, well-nourished, pleasant and cooperative in NAD Head:   Normocephalic and atraumatic. Eyes:  Sclera clear, no icterus.   Conjunctiva pink. Ears:  Normal auditory acuity. Nose:  No deformity, discharge,  or lesions. Mouth:  No deformity or lesions.  Oropharynx dry. Neck:  Supple; no masses or thyromegaly. Lungs:  Clear throughout to auscultation.   No wheezes, crackles, or rhonchi. No acute distress. Heart:  Regular rate and rhythm; no murmurs, clicks, rubs,  or gallops. Extremities:  Without clubbing or edema. Neurologic:  Alert and  oriented x4;  grossly normal neurologically. Skin:  Intact without significant lesions or rashes. Psych:  Alert and cooperative. Normal mood and affect. Abdomen:  Soft, nontender and nondistended. Mild soreness in the left lower quadrant. No masses, hepatosplenomegaly or hernias noted. Hyperactive bowel sounds, without guarding, and without rebound.         Lab Results: Recent Labs    01/04/18 1704  WBC 9.3  HGB 15.5*  HCT 46.1*  PLT 230   BMET Recent Labs    01/04/18 1704 01/05/18 0420  NA 141 140  K 4.1 3.8  CL 104 108  CO2 26 27  GLUCOSE 105* 99  BUN 17 14  CREATININE 0.70 0.58  CALCIUM 9.1 8.4*   LFT Recent Labs    01/04/18 1704  PROT 7.8  ALBUMIN 3.9  AST 17  ALT 10*  ALKPHOS 56  BILITOT 0.4   PT/INR Recent Labs    01/04/18 1704  LABPROT 12.2  INR 0.91    Studies/Results: Dg Chest 2 View  Result Date: 01/04/2018 CLINICAL DATA:  Cough, congestion for 1 week. EXAM: CHEST  2 VIEW COMPARISON:  None. FINDINGS: There is hyperinflation of the lungs compatible with COPD. Heart and mediastinal contours are within normal limits. No focal opacities or effusions. No acute bony abnormality. IMPRESSION: COPD.  No active disease. Electronically Signed   By: Rolm Baptise M.D.   On: 01/04/2018 16:01   Ct Abdomen Pelvis W Contrast  Result Date: 01/04/2018 CLINICAL DATA:  Bowel movement with pain since 3 this morning. Bloody bowel movements EXAM: CT ABDOMEN AND PELVIS WITH CONTRAST TECHNIQUE:  Multidetector CT imaging of the abdomen and pelvis was performed using the standard protocol following bolus administration of intravenous contrast. CONTRAST:  174mL ISOVUE-300 IOPAMIDOL (ISOVUE-300) INJECTION 61% COMPARISON:  None. FINDINGS: Lower chest: Lung bases are clear. Hepatobiliary: No focal hepatic lesion. No biliary duct dilatation. Gallbladder  is normal. Common bile duct is normal. Pancreas: Pancreas is normal. No ductal dilatation. No pancreatic inflammation. Spleen: Normal spleen Adrenals/urinary tract: Adrenal glands and kidneys are normal. The ureters and bladder normal. Stomach/Bowel: Stomach, small-bowel, cecum and appendix normal. The ascending colon is normal. The transverse colon is normal. Beginning in the descending colon there is a long segment submucosal edema measuring approximately 12 cm in length (image 50, series 5). There is mucosal enhancement through this segment. Mild stranding along the LEFT pericolic gutter. The sigmoid colon is normal with scattered diverticula. Rectum normal. There is no evidence of perforation. No diverticular disease in this region. The mesenteric vessels appear patent through this region of descending colon. The SMA and celiac trunk are patent. The IMA is patent. There is ostial calcifications at the origin of the celiac trunk and SMA but no evidence of occlusion. Vascular/Lymphatic: Abdominal aorta is normal caliber with atherosclerotic calcification. There is no retroperitoneal or periportal lymphadenopathy. No pelvic lymphadenopathy. Reproductive: Uterus normal Other: No free fluid. Musculoskeletal: No aggressive osseous lesion. IMPRESSION: 1. 1. Segmental colitis of the descending colon with differential including ischemic colitis, inflammatory bowel disease, infectious colitis, and drug induced colitis. The arterial and venous vessels of the LEFT colon appear patent. 2. No free air or free fluid. 3.  Aortic Atherosclerosis (ICD10-I70.0). Electronically  Signed   By: Suzy Bouchard M.D.   On: 01/04/2018 18:19    Impression: 1.Left lower quadrant abdominal pain along with rectal bleeding. CAT scan finding shows segmental colitis of descending colon-possible ischemic colitis/infectious colitis. Patient known to have diverticulosis. Inflammatory bowel disease less likely.  2. Tubular adenoma colon and 2014, colon cancer in a sister, repeat colonoscopy was advised in 5 years.  3. Hemoglobin stable between 15.7-15.1, no leukocytosis, normal platelets, normal renal function.  Plan: Agree with IV Cipro and IV Flagyl.  Patient is concerned that on discharge she may not be able to take oral antibiotics as it causes stomach upset. Advised patient to take a PPI 30 minutes before her meals, take an antiemetic as Zofran along with it, then to take antibiotics after meals as needed for the required amount of time. Patient able to tolerate clear liquid diet, advance as tolerated. Will send stool studies for C. difficile and GI pathogen panel, although infectious etiology less likely. Patient will need an outpatient colonoscopy in 4-6 weeks.   LOS: 1 day   Ronnette Juniper, M.D.  01/05/2018, 10:26 AM  Pager [phone] If no answer or after 5 PM call [phone]

## 2018-01-05 NOTE — Progress Notes (Signed)
PROGRESS NOTE    Carol [name]  MCN:[id number] DOB: 1936-04-20 DOA: 01/04/2018 PCP: Shirline Frees, MD     Brief Narrative:  Carol [name] is a 82 year old woman with past medical history relevant for reflux, hypertension, anxiety, arthritis came in with bloody diarrhea and CT scan showing colitis.   Assessment & Plan:   Active Problems:   Colitis   Rectal bleeding   GERD (gastroesophageal reflux disease)   ##) Colitis: Suspect this is most likely infectious in etiology.  There is no evidence of ischemia on imaging.  She has no risk of low-flow colitis.  She does not travel anywhere and so more outre enteric forms of colitis are felt to be less likely.   -follow-up C. difficile -Follow-up stool GI panel - GI consult -Continue IV ciprofloxacin and metronidazole  ##) Hypertension: -Continue amlodipine 5 mg -Continue benazepril 20 mg  ##) Hyperlipidemia: -Continue simvastatin 20 mg  Fluids: IV fluids Electrolytes: Monitor and supplement Nutrition: Clear liquid diet advance as tolerated  Prophylaxis: SCDs  Disposition: Pending tolerating diet and improve pain  Full code  Consultants:   Neurology  Procedures: (Don't include imaging studies which can be auto populated. Include things that cannot be auto populated i.e. Echo, Carotid and venous dopplers, Foley, Bipap, HD, tubes/drains, wound vac, central lines etc)  none  Antimicrobials: (specify start and planned stop date. Auto populated tables are space occupying and do not give end dates)  IV ciprofloxacin and metronidazole   Subjective: Patient reports that she is improved from yesterday.  Her abdominal pain is improved dramatically.  She is not quite hungry.  She denies any obstipation.  She denies any nausea or vomiting.  She is able to have a bowel movement.  Objective: Vitals:   01/04/18 1930 01/04/18 2000 01/04/18 2120 01/05/18 0556  BP: 109/84 110/60 134/67 (!) 107/49  Pulse: (!) 104 96 96 80    Resp: 18 (!) 29 (!) 22 18  Temp:   99 F (37.2 C) 98.6 F (37 C)  TempSrc:   Oral Oral  SpO2: 93% 95% 96% 91%  Weight:      Height:        Intake/Output Summary (Last 24 hours) at 01/05/2018 1311 Last data filed at 01/05/2018 0657 Gross per 24 hour  Intake 753.75 ml  Output 550 ml  Net 203.75 ml   Filed Weights   01/04/18 1539  Weight: 49.9 kg (110 lb)    Examination:  General exam: No acute distress Respiratory system: Clear to auscultation. Respiratory effort normal. Cardiovascular system: Regular rate and rhythm, no murmurs Gastrointestinal system: Soft, nondistended, mildly tender to deep palpation in epigastric area, positive bowel sounds Central nervous system: Intact Extremities: Trace lower extremity edema Skin: No rashes on visible skin Psychiatry: Judgement and insight appear normal. Mood & affect appropriate.     Data Reviewed: I have personally reviewed following labs and imaging studies  CBC: Recent Labs  Lab 01/04/18 1704  WBC 9.3  NEUTROABS 6.7  HGB 15.5*  HCT 46.1*  MCV 99.4  PLT 629   Basic Metabolic Panel: Recent Labs  Lab 01/04/18 1704 01/05/18 0420  NA 141 140  K 4.1 3.8  CL 104 108  CO2 26 27  GLUCOSE 105* 99  BUN 17 14  CREATININE 0.70 0.58  CALCIUM 9.1 8.4*   GFR: Estimated Creatinine Clearance: 43 mL/min (by C-G formula based on SCr of 0.58 mg/dL). Liver Function Tests: Recent Labs  Lab 01/04/18 1704  AST 17  ALT 10*  ALKPHOS 56  BILITOT 0.4  PROT 7.8  ALBUMIN 3.9   No results for input(s): LIPASE, AMYLASE in the last 168 hours. No results for input(s): AMMONIA in the last 168 hours. Coagulation Profile: Recent Labs  Lab 01/04/18 1704  INR 0.91   Cardiac Enzymes: Recent Labs  Lab 01/04/18 1704  TROPONINI <0.03   BNP (last 3 results) No results for input(s): PROBNP in the last 8760 hours. HbA1C: No results for input(s): HGBA1C in the last 72 hours. CBG: No results for input(s): GLUCAP in the last 168  hours. Lipid Profile: No results for input(s): CHOL, HDL, LDLCALC, TRIG, CHOLHDL, LDLDIRECT in the last 72 hours. Thyroid Function Tests: No results for input(s): TSH, T4TOTAL, FREET4, T3FREE, THYROIDAB in the last 72 hours. Anemia Panel: No results for input(s): VITAMINB12, FOLATE, FERRITIN, TIBC, IRON, RETICCTPCT in the last 72 hours. Sepsis Labs: No results for input(s): PROCALCITON, LATICACIDVEN in the last 168 hours.  No results found for this or any previous visit (from the past 240 hour(s)).       Radiology Studies: Dg Chest 2 View  Result Date: 01/04/2018 CLINICAL DATA:  Cough, congestion for 1 week. EXAM: CHEST  2 VIEW COMPARISON:  None. FINDINGS: There is hyperinflation of the lungs compatible with COPD. Heart and mediastinal contours are within normal limits. No focal opacities or effusions. No acute bony abnormality. IMPRESSION: COPD.  No active disease. Electronically Signed   By: Rolm Baptise M.D.   On: 01/04/2018 16:01   Ct Abdomen Pelvis W Contrast  Result Date: 01/04/2018 CLINICAL DATA:  Bowel movement with pain since 3 this morning. Bloody bowel movements EXAM: CT ABDOMEN AND PELVIS WITH CONTRAST TECHNIQUE: Multidetector CT imaging of the abdomen and pelvis was performed using the standard protocol following bolus administration of intravenous contrast. CONTRAST:  170mL ISOVUE-300 IOPAMIDOL (ISOVUE-300) INJECTION 61% COMPARISON:  None. FINDINGS: Lower chest: Lung bases are clear. Hepatobiliary: No focal hepatic lesion. No biliary duct dilatation. Gallbladder is normal. Common bile duct is normal. Pancreas: Pancreas is normal. No ductal dilatation. No pancreatic inflammation. Spleen: Normal spleen Adrenals/urinary tract: Adrenal glands and kidneys are normal. The ureters and bladder normal. Stomach/Bowel: Stomach, small-bowel, cecum and appendix normal. The ascending colon is normal. The transverse colon is normal. Beginning in the descending colon there is a long segment  submucosal edema measuring approximately 12 cm in length (image 50, series 5). There is mucosal enhancement through this segment. Mild stranding along the LEFT pericolic gutter. The sigmoid colon is normal with scattered diverticula. Rectum normal. There is no evidence of perforation. No diverticular disease in this region. The mesenteric vessels appear patent through this region of descending colon. The SMA and celiac trunk are patent. The IMA is patent. There is ostial calcifications at the origin of the celiac trunk and SMA but no evidence of occlusion. Vascular/Lymphatic: Abdominal aorta is normal caliber with atherosclerotic calcification. There is no retroperitoneal or periportal lymphadenopathy. No pelvic lymphadenopathy. Reproductive: Uterus normal Other: No free fluid. Musculoskeletal: No aggressive osseous lesion. IMPRESSION: 1. 1. Segmental colitis of the descending colon with differential including ischemic colitis, inflammatory bowel disease, infectious colitis, and drug induced colitis. The arterial and venous vessels of the LEFT colon appear patent. 2. No free air or free fluid. 3.  Aortic Atherosclerosis (ICD10-I70.0). Electronically Signed   By: Suzy Bouchard M.D.   On: 01/04/2018 18:19        Scheduled Meds: . nicotine  14 mg Transdermal Daily  . ondansetron (ZOFRAN) IV  4 mg  Intravenous Once   Continuous Infusions: . 0.9 % NaCl with KCl 20 mEq / L 75 mL/hr at 01/04/18 2237  . ciprofloxacin Stopped (01/05/18 0753)  . metronidazole Stopped (01/05/18 0528)     LOS: 1 day    Time spent: [place], MD [facility]  If 7PM-7AM, please contact night-coverage [email or web address] Password [email or web address] 01/05/2018, 1:11 PM

## 2018-01-06 DIAGNOSIS — K529 Noninfective gastroenteritis and colitis, unspecified: Secondary | ICD-10-CM

## 2018-01-06 DIAGNOSIS — K625 Hemorrhage of anus and rectum: Secondary | ICD-10-CM

## 2018-01-06 DIAGNOSIS — R0902 Hypoxemia: Secondary | ICD-10-CM

## 2018-01-06 DIAGNOSIS — K219 Gastro-esophageal reflux disease without esophagitis: Secondary | ICD-10-CM

## 2018-01-06 LAB — CBC
HCT: 40.8 % (ref 36.0–46.0)
Hemoglobin: 13.3 g/dL (ref 12.0–15.0)
MCH: 33.3 pg (ref 26.0–34.0)
MCHC: 32.6 g/dL (ref 30.0–36.0)
MCV: 102 fL — ABNORMAL HIGH (ref 78.0–100.0)
Platelets: 218 10*3/uL (ref 150–400)
RBC: 4 MIL/uL (ref 3.87–5.11)
RDW: 13.8 % (ref 11.5–15.5)
WBC: 8.2 10*3/uL (ref 4.0–10.5)

## 2018-01-06 MED ORDER — ONDANSETRON 4 MG PO TBDP: 4.0000 mg | ORAL_TABLET | Freq: Three times a day (TID) | ORAL | 0 refills | Status: DC | PRN

## 2018-01-06 MED ORDER — METRONIDAZOLE 500 MG PO TABS: 500.0000 mg | ORAL_TABLET | Freq: Three times a day (TID) | ORAL | 0 refills | Status: DC

## 2018-01-06 MED ORDER — CIPROFLOXACIN HCL 500 MG PO TABS: 500.0000 mg | ORAL_TABLET | Freq: Two times a day (BID) | ORAL | 0 refills | Status: DC

## 2018-01-06 MED ORDER — AMOXICILLIN-POT CLAVULANATE 875-125 MG PO TABS
1.0000 | ORAL_TABLET | Freq: Two times a day (BID) | ORAL | Status: DC
  Administered 2018-01-06: 1 via ORAL
  Filled 2018-01-06: qty 1

## 2018-01-06 MED ORDER — CIPROFLOXACIN HCL 500 MG PO TABS: 500.0000 mg | ORAL_TABLET | Freq: Two times a day (BID) | ORAL | Status: DC

## 2018-01-06 MED ORDER — AMOXICILLIN-POT CLAVULANATE 875-125 MG PO TABS: 1.0000 | ORAL_TABLET | Freq: Two times a day (BID) | ORAL | 0 refills | Status: DC

## 2018-01-06 MED ORDER — METRONIDAZOLE 500 MG PO TABS: 500.0000 mg | ORAL_TABLET | Freq: Three times a day (TID) | ORAL | Status: DC

## 2018-01-06 NOTE — Discharge Summary (Signed)
Physician Discharge Summary   Patient ID: Carol [name] MRN: [id number] DOB/AGE: 80-Apr-1937 80 y.o.  Admit date: 01/04/2018 Discharge date: 01/06/2018  Primary Care Physician:  Carol Frees, MD  Discharge Diagnoses:    . Colitis . Rectal bleeding Hypertension Hyperlipidemia  Consults: Gastroenterology  Recommendations for Outpatient Follow-up:  1. Per PT evaluation, no PT follow-up needed 2. Please repeat CBC/BMET at next visit 3. patient recommended to follow-up with gastroenterology in 2 weeks, will need outpatient colonoscopy 4. Continue Augmentin for 10 more days   DIET: Soft diet    Allergies:   Allergies  Allergen Reactions  . Ciprofloxacin Nausea Only    Nausea and legs weak  . Nitrofuran Derivatives Nausea Only    nitrofuratonin bad nausea, legs weak  . Sulfa Antibiotics     jittery     DISCHARGE MEDICATIONS: Allergies as of 01/06/2018      Reactions   Ciprofloxacin Nausea Only   Nausea and legs weak   Nitrofuran Derivatives Nausea Only   nitrofuratonin bad nausea, legs weak   Sulfa Antibiotics    jittery      Medication List    TAKE these medications   acetaminophen 650 MG CR tablet Commonly known as:  TYLENOL Take 1,300 mg by mouth every 8 (eight) hours as needed for pain.   ALIGN PO Take 1 tablet by mouth daily.   ALPRAZolam 0.25 MG tablet Commonly known as:  XANAX Take 0.25 mg by mouth 2 (two) times daily as needed for anxiety.   amLODipine 5 MG tablet Commonly known as:  NORVASC Take 5 mg by mouth daily.   amoxicillin-clavulanate 875-125 MG tablet Commonly known as:  AUGMENTIN Take 1 tablet by mouth 2 (two) times daily. X 10 days   benazepril 20 MG tablet Commonly known as:  LOTENSIN Take 20 mg by mouth daily.   CALTRATE 600+D PLUS PO Take 1 tablet by mouth daily.   escitalopram 10 MG tablet Commonly known as:  LEXAPRO Take 10 mg by mouth daily.   fluticasone 50 MCG/ACT nasal spray Commonly known as:   FLONASE Place 1 spray into both nostrils daily as needed for allergies or rhinitis.   MAGNESIUM CARBONATE PO Take 1 tablet by mouth daily.   multivitamin with minerals Tabs tablet Take 1 tablet by mouth daily.   ondansetron 4 MG disintegrating tablet Commonly known as:  ZOFRAN ODT Take 1 tablet (4 mg total) by mouth every 8 (eight) hours as needed for nausea or vomiting.   pantoprazole 40 MG tablet Commonly known as:  PROTONIX Take 40 mg by mouth daily.   polycarbophil 625 MG tablet Commonly known as:  FIBERCON Take 625 mg by mouth daily.   raloxifene 60 MG tablet Commonly known as:  EVISTA Take 60 mg by mouth daily.   simvastatin 20 MG tablet Commonly known as:  ZOCOR Take 20 mg by mouth daily.        Brief H and P: For complete details please refer to admission H and P, but in brief Carol [name] is a 82 year old woman with past medical history relevant for reflux, hypertension, anxiety, arthritis came in with bloody diarrhea and CT scan showing colitis.  Hospital Course:   Rectal bleeding/colitis acute -CT abdomen pelvis showed segmental colitis of the descending colon, differentials including ischemic versus infectious colitis -Improving, last BM 2 days ago -Patient was started on IV ciprofloxacin and Flagyl, symptoms improving.  Transition to oral Augmentin for 10 more days, has allergy to ciprofloxacin with nausea. -Patient  was recommended soft diet for a week and follow-up with gastroenterology, she will need outpatient colonoscopy in 4-6 weeks -H&H stable, hemoglobin 13.3 at the time of discharge.   Hypertension -Currently stable, continue amlodipine, benazepril  Hyperlipidemia -Continue simvastatin  Patient was evaluated by PT and she is currently back to baseline.  Day of Discharge BP (!) 105/51 (BP Location: Left Arm)   Pulse 81   Temp 98.7 F (37.1 C) (Oral)   Resp 16   Ht 5\' 1"  (1.549 m)   Wt 49.9 kg (110 lb)   SpO2 93%   BMI 20.78 kg/m    Physical Exam: General: Alert and awake oriented x3 not in any acute distress. HEENT: anicteric sclera, pupils reactive to light and accommodation CVS: S1-S2 clear no murmur rubs or gallops Chest: clear to auscultation bilaterally, no wheezing rales or rhonchi Abdomen: soft nontender, nondistended, normal bowel sounds Extremities: no cyanosis, clubbing or edema noted bilaterally Neuro: Cranial nerves II-XII intact, no focal neurological deficits   The results of significant diagnostics from this hospitalization (including imaging, microbiology, ancillary and laboratory) are listed below for reference.    LAB RESULTS: Basic Metabolic Panel: Recent Labs  Lab 01/04/18 1704 01/05/18 0420  NA 141 140  K 4.1 3.8  CL 104 108  CO2 26 27  GLUCOSE 105* 99  BUN 17 14  CREATININE 0.70 0.58  CALCIUM 9.1 8.4*   Liver Function Tests: Recent Labs  Lab 01/04/18 1704  AST 17  ALT 10*  ALKPHOS 56  BILITOT 0.4  PROT 7.8  ALBUMIN 3.9   No results for input(s): LIPASE, AMYLASE in the last 168 hours. No results for input(s): AMMONIA in the last 168 hours. CBC: Recent Labs  Lab 01/04/18 1704 01/06/18 0437  WBC 9.3 8.2  NEUTROABS 6.7  --   HGB 15.5* 13.3  HCT 46.1* 40.8  MCV 99.4 102.0*  PLT 230 218   Cardiac Enzymes: Recent Labs  Lab 01/04/18 1704  TROPONINI <0.03   BNP: Invalid input(s): POCBNP CBG: No results for input(s): GLUCAP in the last 168 hours.  Significant Diagnostic Studies:  Dg Chest 2 View  Result Date: 01/04/2018 CLINICAL DATA:  Cough, congestion for 1 week. EXAM: CHEST  2 VIEW COMPARISON:  None. FINDINGS: There is hyperinflation of the lungs compatible with COPD. Heart and mediastinal contours are within normal limits. No focal opacities or effusions. No acute bony abnormality. IMPRESSION: COPD.  No active disease. Electronically Signed   By: Rolm Baptise M.D.   On: 01/04/2018 16:01   Ct Abdomen Pelvis W Contrast  Result Date: 01/04/2018 CLINICAL DATA:   Bowel movement with pain since 3 this morning. Bloody bowel movements EXAM: CT ABDOMEN AND PELVIS WITH CONTRAST TECHNIQUE: Multidetector CT imaging of the abdomen and pelvis was performed using the standard protocol following bolus administration of intravenous contrast. CONTRAST:  159mL ISOVUE-300 IOPAMIDOL (ISOVUE-300) INJECTION 61% COMPARISON:  None. FINDINGS: Lower chest: Lung bases are clear. Hepatobiliary: No focal hepatic lesion. No biliary duct dilatation. Gallbladder is normal. Common bile duct is normal. Pancreas: Pancreas is normal. No ductal dilatation. No pancreatic inflammation. Spleen: Normal spleen Adrenals/urinary tract: Adrenal glands and kidneys are normal. The ureters and bladder normal. Stomach/Bowel: Stomach, small-bowel, cecum and appendix normal. The ascending colon is normal. The transverse colon is normal. Beginning in the descending colon there is a long segment submucosal edema measuring approximately 12 cm in length (image 50, series 5). There is mucosal enhancement through this segment. Mild stranding along the LEFT pericolic gutter.  The sigmoid colon is normal with scattered diverticula. Rectum normal. There is no evidence of perforation. No diverticular disease in this region. The mesenteric vessels appear patent through this region of descending colon. The SMA and celiac trunk are patent. The IMA is patent. There is ostial calcifications at the origin of the celiac trunk and SMA but no evidence of occlusion. Vascular/Lymphatic: Abdominal aorta is normal caliber with atherosclerotic calcification. There is no retroperitoneal or periportal lymphadenopathy. No pelvic lymphadenopathy. Reproductive: Uterus normal Other: No free fluid. Musculoskeletal: No aggressive osseous lesion. IMPRESSION: 1. 1. Segmental colitis of the descending colon with differential including ischemic colitis, inflammatory bowel disease, infectious colitis, and drug induced colitis. The arterial and venous vessels  of the LEFT colon appear patent. 2. No free air or free fluid. 3.  Aortic Atherosclerosis (ICD10-I70.0). Electronically Signed   By: Suzy Bouchard M.D.   On: 01/04/2018 18:19   Dg Chest Port 1 View  Result Date: 01/05/2018 CLINICAL DATA:  Cough for 1 week, hypertension, blood in stool, GERD, smoker EXAM: PORTABLE CHEST 1 VIEW COMPARISON:  Portable exam 1530 hours compared to 01/04/2018 FINDINGS: Normal heart size, mediastinal contours, and pulmonary vascularity. Atherosclerotic calcification aorta. Emphysematous and bronchitic changes consistent with COPD. No acute infiltrate, pleural effusion, or pneumothorax. Bones demineralized with old healed fracture deformity of the proximal RIGHT humerus. IMPRESSION: COPD changes without acute infiltrate. Electronically Signed   By: Lavonia Dana M.D.   On: 01/05/2018 16:05    2D ECHO:   Disposition and Follow-up: Discharge Instructions    Diet - low sodium heart healthy   Complete by:  As directed    Discharge instructions   Complete by:  As directed    SOFT diet for a week.  Please follow-up with your gastroenterologist, you will need outpatient colonoscopy in 4-6 weeks.   Increase activity slowly   Complete by:  As directed        DISPOSITION: [facility]    Laurence Spates, MD. Schedule an appointment as soon as possible for a visit in 2 week(s).   Specialty:  Gastroenterology Why:  For hospital follow-up Contact information: [address] [place] [address] [phone]        Carol Frees, MD. Schedule an appointment as soon as possible for a visit in 2 week(s).   Specialty:  Family Medicine Contact information: [place] [address] [phone]            Time spent on Discharge: 73mins   Signed:   Estill Cotta M.D. [facility] 01/06/2018, 1:50 PM Pager: [phone]

## 2018-01-06 NOTE — Progress Notes (Signed)
[facility] Progress Note  [place] A Hartmann 82 y.o. 01/31/1936  CC:  Rectal bleeding, colitis   Subjective: Patient is feeling better. No further bleeding in last 2 days. Last bowel movement was 2 days ago. Abdominal pain resolved. Tolerating diet. Afebrile this morning  ROS : Negative for chest pain and shortness of breath.   Objective: Vital signs in last 24 hours: Vitals:   01/06/18 0657 01/06/18 1037  BP: (!) 116/52 (!) 105/51  Pulse: 83 81  Resp: 16   Temp: 98.7 F (37.1 C) 98.7 F (37.1 C)  SpO2: 97% 93%    Physical Exam:  General:  Alert, cooperative, elderly appearing patient. Not in acute distress   Head:  Normocephalic, without obvious abnormality, atraumatic  Eyes:  , EOM's intact,   Lungs:   Clear to auscultation bilaterally, respirations unlabored  Heart:  Regular rate and rhythm, S1, S2 normal  Abdomen:   Soft, non-tender, bowel sounds active all four quadrants,  no masses,   Extremities: Extremities normal, atraumatic, no  edema       Lab Results: Recent Labs    01/04/18 1704 01/05/18 0420  NA 141 140  K 4.1 3.8  CL 104 108  CO2 26 27  GLUCOSE 105* 99  BUN 17 14  CREATININE 0.70 0.58  CALCIUM 9.1 8.4*   Recent Labs    01/04/18 1704  AST 17  ALT 10*  ALKPHOS 56  BILITOT 0.4  PROT 7.8  ALBUMIN 3.9   Recent Labs    01/04/18 1704 01/06/18 0437  WBC 9.3 8.2  NEUTROABS 6.7  --   HGB 15.5* 13.3  HCT 46.1* 40.8  MCV 99.4 102.0*  PLT 230 218   Recent Labs    01/04/18 1704  LABPROT 12.2  INR 0.91      Assessment/Plan: - Rectal bleeding with CT scan showing long segment submucosal edema / segmental colitis involving descending colon. Rectal bleeding is resolved now. - Personal history of adenomatous polyp. Last colonoscopy in 2014.  Recommendations ------------------------ - Patient has been switched to oral Cipro Floxin and Flagyl. Continue antibiotics for total 10-14 days. - Continue PPI and as needed Zofran -  Okay to discharge from GI standpoint. - Follow-up with Dr. Edwards/[facility]  in 3-4 weeks after discharge. She may need outpatient colonoscopy in 4-6 weeks. - GI will sign off. Call us back if needed  Otis Brace MD, [place] 01/06/2018, 11:00 AM  Contact #  [phone]

## 2018-01-06 NOTE — Evaluation (Signed)
Physical Therapy Evaluation Patient Details Name: Carol [name] MRN: [id number] DOB: 1936-06-03 Today's Date: 01/06/2018   History of Present Illness  Carol [name] is a 82 year old woman with past medical history relevant for reflux, hypertension, anxiety, arthritis came in with bloody diarrhea and CT scan showing colitis  Clinical Impression  Patient is  Mobilizing without assistance Family has assisted ambulation and is available at DC. No further PT needs.    Follow Up Recommendations No PT follow up    Equipment Recommendations  None recommended by PT    Recommendations for Other Services       Precautions / Restrictions Precautions Precautions: None      Mobility  Bed Mobility Overal bed mobility: Independent                Transfers Overall transfer level: Independent                  Ambulation/Gait Ambulation/Gait assistance: Independent Ambulation Distance (Feet): 400 Feet   Gait Pattern/deviations: WFL(Within Functional Limits) Gait velocity: wfl   General Gait Details: pushed IV pole part if way, ambulates well without holding onto IV.  Stairs            Wheelchair Mobility    Modified Rankin (Stroke Patients Only)       Balance Overall balance assessment: Independent                                           Pertinent Vitals/Pain Pain Assessment: No/denies pain    Home Living Family/patient expects to be discharged to:: Private residence Living Arrangements: Alone;Children Available Help at Discharge: Family Type of Home: House Home Access: Stairs to enter   Technical brewer of Steps: 4 Home Layout: One level Home Equipment: None Additional Comments: still drives    Prior Function Level of Independence: Independent               Hand Dominance        Extremity/Trunk Assessment   Upper Extremity Assessment Upper Extremity Assessment: Overall WFL for tasks assessed     Lower Extremity Assessment Lower Extremity Assessment: Overall WFL for tasks assessed    Cervical / Trunk Assessment Cervical / Trunk Assessment: Normal  Communication   Communication: No difficulties  Cognition Arousal/Alertness: Awake/alert Behavior During Therapy: WFL for tasks assessed/performed Overall Cognitive Status: Within Functional Limits for tasks assessed                                        General Comments      Exercises     Assessment/Plan    PT Assessment Patent does not need any further PT services  PT Problem List         PT Treatment Interventions      PT Goals (Current goals can be found in the Care Plan section)  Acute Rehab PT Goals Patient Stated Goal: go home PT Goal Formulation: All assessment and education complete, DC therapy    Frequency     Barriers to discharge        Co-evaluation               AM-PAC PT "6 Clicks" Daily Activity  Outcome Measure Difficulty turning over in bed (including adjusting bedclothes, sheets and blankets)?: None Difficulty  moving from lying on back to sitting on the side of the bed? : None Difficulty sitting down on and standing up from a chair with arms (e.g., wheelchair, bedside commode, etc,.)?: None Help needed moving to and from a bed to chair (including a wheelchair)?: None Help needed walking in hospital room?: None Help needed climbing 3-5 steps with a railing? : A Little 6 Click Score: 23    End of Session   Activity Tolerance: Patient tolerated treatment well Patient left: in chair Nurse Communication: Mobility status      Time: 8891-6945 PT Time Calculation (min) (ACUTE ONLY): 12 min   Charges:         PT G CodesTresa Endo PT [place] 01/06/2018, 12:00 PM

## 2018-01-14 DIAGNOSIS — I10 Essential (primary) hypertension: Secondary | ICD-10-CM | POA: Diagnosis not present

## 2018-01-14 DIAGNOSIS — K529 Noninfective gastroenteritis and colitis, unspecified: Secondary | ICD-10-CM | POA: Diagnosis not present

## 2018-01-14 DIAGNOSIS — J0100 Acute maxillary sinusitis, unspecified: Secondary | ICD-10-CM | POA: Diagnosis not present

## 2018-01-14 DIAGNOSIS — F17200 Nicotine dependence, unspecified, uncomplicated: Secondary | ICD-10-CM | POA: Diagnosis not present

## 2018-01-14 DIAGNOSIS — K219 Gastro-esophageal reflux disease without esophagitis: Secondary | ICD-10-CM | POA: Diagnosis not present

## 2018-01-20 ENCOUNTER — Ambulatory Visit
Admission: RE | Admit: 2018-01-20 | Discharge: 2018-01-20 | Disposition: A | Discharge status: Home / Self Care | Payer: Medicare Other | Source: Ambulatory Visit | Attending: Family Medicine | Admitting: Family Medicine

## 2018-01-20 DIAGNOSIS — Z1231 Encounter for screening mammogram for malignant neoplasm of breast: Secondary | ICD-10-CM | POA: Diagnosis not present

## 2018-01-23 ENCOUNTER — Other Ambulatory Visit: Payer: Self-pay | Admitting: Gastroenterology

## 2018-01-23 DIAGNOSIS — F419 Anxiety disorder, unspecified: Secondary | ICD-10-CM | POA: Diagnosis not present

## 2018-01-23 DIAGNOSIS — Z86010 Personal history of colonic polyps: Secondary | ICD-10-CM | POA: Diagnosis not present

## 2018-01-23 DIAGNOSIS — K529 Noninfective gastroenteritis and colitis, unspecified: Secondary | ICD-10-CM | POA: Diagnosis not present

## 2018-01-23 DIAGNOSIS — K219 Gastro-esophageal reflux disease without esophagitis: Secondary | ICD-10-CM | POA: Diagnosis not present

## 2018-01-23 DIAGNOSIS — K5792 Diverticulitis of intestine, part unspecified, without perforation or abscess without bleeding: Secondary | ICD-10-CM | POA: Diagnosis not present

## 2018-01-29 ENCOUNTER — Encounter (HOSPITAL_COMMUNITY): Payer: Self-pay | Admitting: Emergency Medicine

## 2018-01-29 ENCOUNTER — Other Ambulatory Visit: Payer: Self-pay

## 2018-01-30 DIAGNOSIS — F324 Major depressive disorder, single episode, in partial remission: Secondary | ICD-10-CM | POA: Diagnosis not present

## 2018-01-30 DIAGNOSIS — M810 Age-related osteoporosis without current pathological fracture: Secondary | ICD-10-CM | POA: Diagnosis not present

## 2018-01-30 DIAGNOSIS — F419 Anxiety disorder, unspecified: Secondary | ICD-10-CM | POA: Diagnosis not present

## 2018-01-30 DIAGNOSIS — L989 Disorder of the skin and subcutaneous tissue, unspecified: Secondary | ICD-10-CM | POA: Diagnosis not present

## 2018-01-30 DIAGNOSIS — K219 Gastro-esophageal reflux disease without esophagitis: Secondary | ICD-10-CM | POA: Diagnosis not present

## 2018-01-30 DIAGNOSIS — I10 Essential (primary) hypertension: Secondary | ICD-10-CM | POA: Diagnosis not present

## 2018-01-30 DIAGNOSIS — F17200 Nicotine dependence, unspecified, uncomplicated: Secondary | ICD-10-CM | POA: Diagnosis not present

## 2018-01-30 DIAGNOSIS — E7800 Pure hypercholesterolemia, unspecified: Secondary | ICD-10-CM | POA: Diagnosis not present

## 2018-02-04 ENCOUNTER — Ambulatory Visit (HOSPITAL_COMMUNITY): Payer: Medicare Other | Admitting: Certified Registered"

## 2018-02-04 ENCOUNTER — Other Ambulatory Visit: Payer: Self-pay

## 2018-02-04 ENCOUNTER — Encounter (HOSPITAL_COMMUNITY)
Admission: RE | Disposition: A | Discharge status: Home / Self Care | Payer: Self-pay | Source: Ambulatory Visit | Attending: Gastroenterology

## 2018-02-04 ENCOUNTER — Ambulatory Visit (HOSPITAL_COMMUNITY)
Admission: RE | Admit: 2018-02-04 | Discharge: 2018-02-04 | Disposition: A | Discharge status: Home / Self Care | Payer: Medicare Other | Source: Ambulatory Visit | Attending: Gastroenterology | Admitting: Gastroenterology

## 2018-02-04 ENCOUNTER — Encounter (HOSPITAL_COMMUNITY): Payer: Self-pay | Admitting: Certified Registered"

## 2018-02-04 DIAGNOSIS — Z79810 Long term (current) use of selective estrogen receptor modulators (SERMs): Secondary | ICD-10-CM | POA: Insufficient documentation

## 2018-02-04 DIAGNOSIS — K640 First degree hemorrhoids: Secondary | ICD-10-CM | POA: Insufficient documentation

## 2018-02-04 DIAGNOSIS — Z79899 Other long term (current) drug therapy: Secondary | ICD-10-CM | POA: Diagnosis not present

## 2018-02-04 DIAGNOSIS — R933 Abnormal findings on diagnostic imaging of other parts of digestive tract: Secondary | ICD-10-CM | POA: Insufficient documentation

## 2018-02-04 DIAGNOSIS — F17210 Nicotine dependence, cigarettes, uncomplicated: Secondary | ICD-10-CM | POA: Insufficient documentation

## 2018-02-04 DIAGNOSIS — F419 Anxiety disorder, unspecified: Secondary | ICD-10-CM | POA: Insufficient documentation

## 2018-02-04 DIAGNOSIS — K5730 Diverticulosis of large intestine without perforation or abscess without bleeding: Secondary | ICD-10-CM | POA: Insufficient documentation

## 2018-02-04 DIAGNOSIS — D122 Benign neoplasm of ascending colon: Secondary | ICD-10-CM | POA: Insufficient documentation

## 2018-02-04 DIAGNOSIS — K529 Noninfective gastroenteritis and colitis, unspecified: Secondary | ICD-10-CM | POA: Insufficient documentation

## 2018-02-04 DIAGNOSIS — I10 Essential (primary) hypertension: Secondary | ICD-10-CM | POA: Diagnosis not present

## 2018-02-04 DIAGNOSIS — Z86010 Personal history of colonic polyps: Secondary | ICD-10-CM | POA: Insufficient documentation

## 2018-02-04 DIAGNOSIS — K219 Gastro-esophageal reflux disease without esophagitis: Secondary | ICD-10-CM | POA: Insufficient documentation

## 2018-02-04 DIAGNOSIS — K921 Melena: Secondary | ICD-10-CM | POA: Diagnosis not present

## 2018-02-04 HISTORY — PX: COLONOSCOPY WITH PROPOFOL: SHX5780

## 2018-02-04 SURGERY — COLONOSCOPY WITH PROPOFOL
Anesthesia: Monitor Anesthesia Care

## 2018-02-04 MED ORDER — LIDOCAINE 2% (20 MG/ML) 5 ML SYRINGE
INTRAMUSCULAR | Status: DC | PRN
  Administered 2018-02-04: 40 mg via INTRAVENOUS

## 2018-02-04 MED ORDER — PROPOFOL 10 MG/ML IV BOLUS
INTRAVENOUS | Status: DC | PRN
  Administered 2018-02-04 (×18): 20 mg via INTRAVENOUS

## 2018-02-04 MED ORDER — SODIUM CHLORIDE 0.9 % IV SOLN: INTRAVENOUS | Status: DC

## 2018-02-04 MED ORDER — PHENYLEPHRINE 40 MCG/ML (10ML) SYRINGE FOR IV PUSH (FOR BLOOD PRESSURE SUPPORT)
PREFILLED_SYRINGE | INTRAVENOUS | Status: DC | PRN
  Administered 2018-02-04: 80 ug via INTRAVENOUS

## 2018-02-04 MED ORDER — LACTATED RINGERS IV SOLN
INTRAVENOUS | Status: DC
  Administered 2018-02-04 (×2): via INTRAVENOUS

## 2018-02-04 MED ORDER — PROPOFOL 10 MG/ML IV BOLUS
INTRAVENOUS | Status: AC
  Filled 2018-02-04: qty 40

## 2018-02-04 SURGICAL SUPPLY — 21 items

## 2018-02-04 NOTE — Anesthesia Preprocedure Evaluation (Addendum)
Anesthesia Evaluation  Patient identified by MRN, date of birth, ID band Patient awake    Reviewed: Allergy & Precautions, NPO status , Patient's Chart, lab work & pertinent test results  Airway Mallampati: II  TM Distance: >3 FB Neck ROM: Full    Dental no notable dental hx. (+) Dental Advisory Given   Pulmonary Current Smoker,    Pulmonary exam normal breath sounds clear to auscultation       Cardiovascular hypertension, Normal cardiovascular exam Rhythm:Regular Rate:Normal     Neuro/Psych negative neurological ROS  negative psych ROS   GI/Hepatic Neg liver ROS, GERD  ,  Endo/Other  negative endocrine ROS  Renal/GU negative Renal ROS  negative genitourinary   Musculoskeletal negative musculoskeletal ROS (+)   Abdominal   Peds negative pediatric ROS (+)  Hematology negative hematology ROS (+)   Anesthesia Other Findings   Reproductive/Obstetrics negative OB ROS                            Anesthesia Physical Anesthesia Plan  ASA: II  Anesthesia Plan: MAC   Post-op Pain Management:    Induction: Intravenous  PONV Risk Score and Plan: 0  Airway Management Planned: Simple Face Mask  Additional Equipment:   Intra-op Plan:   Post-operative Plan:   Informed Consent: I have reviewed the patients History and Physical, chart, labs and discussed the procedure including the risks, benefits and alternatives for the proposed anesthesia with the patient or authorized representative who has indicated his/her understanding and acceptance.   Dental advisory given  Plan Discussed with: CRNA and Surgeon  Anesthesia Plan Comments:         Anesthesia Quick Evaluation

## 2018-02-04 NOTE — Anesthesia Procedure Notes (Signed)
Date/Time: 02/04/2018 9:11 AM Performed by: Cynda Familia, CRNA Pre-anesthesia Checklist: Patient identified, Emergency Drugs available, Suction available, Timeout performed and Patient being monitored Patient Re-evaluated:Patient Re-evaluated prior to induction Oxygen Delivery Method: Simple face mask Placement Confirmation: positive ETCO2 and breath sounds checked- equal and bilateral Dental Injury: Teeth and Oropharynx as per pre-operative assessment

## 2018-02-04 NOTE — H&P (Signed)
Subjective:   Patient is a 82 y.o. female presents with history of polyps with recent acute colitis ? Ischemic with narrowing on CT. Procedure including risks and benefits discussed in office.  Patient Active Problem List   Diagnosis Date Noted  . Colitis 01/04/2018  . Rectal bleeding 01/04/2018  . GERD (gastroesophageal reflux disease) 01/04/2018   Past Medical History:  Diagnosis Date  . Anxiety   . Arthritis    arthritis -hips  . Complication of anesthesia    B/P dropped post colonoscopy x1  . GERD (gastroesophageal reflux disease)   . Headache(784.0)    migraines rare occ.  Marland Kitchen Hypertension   . Urinary tract infection early aug 2014    Past Surgical History:  Procedure Laterality Date  . CATARACT EXTRACTION, BILATERAL Bilateral   . COLONOSCOPY WITH PROPOFOL N/A 08/03/2013   Procedure: COLONOSCOPY WITH PROPOFOL;  Surgeon: Winfield Cunas., MD;  Location: [facility];  Service: Endoscopy;  Laterality: N/A;  ultra thin colon scope  . LASIK      Medications Prior to Admission  Medication Sig Dispense Refill Last Dose  . acetaminophen (TYLENOL) 650 MG CR tablet Take 1,300 mg by mouth every 8 (eight) hours as needed for pain.   02/02/2018 at Unknown time  . amLODipine (NORVASC) 5 MG tablet Take 5 mg by mouth daily.   02/03/2018 at Unknown time  . benazepril (LOTENSIN) 20 MG tablet Take 20 mg by mouth daily.   02/03/2018 at Unknown time  . Calcium Carbonate-Vit D-Min (CALTRATE 600+D PLUS PO) Take 1 tablet by mouth daily.   Past Month at Unknown time  . escitalopram (LEXAPRO) 10 MG tablet Take 10 mg by mouth daily.   02/02/2018  . fluticasone (FLONASE) 50 MCG/ACT nasal spray Place 1 spray into both nostrils daily as needed for allergies or rhinitis.   Past Week at Unknown time  . pantoprazole (PROTONIX) 40 MG tablet Take 40 mg by mouth daily.  1 02/03/2018 at Unknown time  . polycarbophil (FIBERCON) 625 MG tablet Take 625 mg by mouth daily.   02/03/2018 at Unknown time  . Probiotic  Product (ALIGN PO) Take 1 tablet by mouth daily.   02/03/2018 at Unknown time  . Pseudoephedrine HCl (SUDAFED 12 HOUR PO) Take 1 tablet by mouth 2 (two) times daily as needed (for congestion).   Past Week at Unknown time  . raloxifene (EVISTA) 60 MG tablet Take 60 mg by mouth daily.   Past Week at Unknown time  . simvastatin (ZOCOR) 20 MG tablet Take 20 mg by mouth daily.   Past Week at Unknown time  . ALPRAZolam (XANAX) 0.25 MG tablet Take 0.25 mg by mouth 2 (two) times daily as needed for anxiety.   More than a month at Unknown time  . MAGNESIUM CARBONATE PO Take 1 tablet by mouth daily.   More than a month at Unknown time  . Multiple Vitamin (MULTIVITAMIN WITH MINERALS) TABS tablet Take 1 tablet by mouth daily.   More than a month at Unknown time  . ondansetron (ZOFRAN ODT) 4 MG disintegrating tablet Take 1 tablet (4 mg total) by mouth every 8 (eight) hours as needed for nausea or vomiting. (Patient not taking: Reported on 01/23/2018) 30 tablet 0 Completed Course at Unknown time   Allergies  Allergen Reactions  . Ciprofloxacin Nausea Only and Other (See Comments)    Nausea and legs weak  . Nitrofuran Derivatives Nausea Only and Other (See Comments)    nitrofuratonin bad nausea, legs weak  .  Sulfa Antibiotics Other (See Comments)    jittery    Social History   Tobacco Use  . Smoking status: Current Every Day Smoker    Packs/day: 1.00    Years: 40.00    Pack years: 40.00    Types: Cigarettes  . Smokeless tobacco: Never Used  Substance Use Topics  . Alcohol use: No    History reviewed. No pertinent family history.   Objective:   Patient Vitals for the past 8 hrs:  BP Temp Temp src Pulse Resp SpO2 Height Weight  02/04/18 0740 (!) 150/51 98.2 F (36.8 C) Oral 88 (!) 25 92 % 5\' 1"  (1.549 m) 47.2 kg (104 lb)   No intake/output data recorded. No intake/output data recorded.   See MD Preop evaluation      Assessment:   1. Colitis 2. Abnormal CT 3. History of  polyps.  Plan:   Colonoscopy have discussed with pt and family will use the ultrathin scope.

## 2018-02-04 NOTE — Op Note (Signed)
[facility] Patient Name: Carol [name] Procedure Date: 02/04/2018 MRN: [id number] Attending MD: Nancy Fetter Dr., MD Date of Birth: 27-Feb-1936 CSN: [id number] Age: 82 Admit Type: Inpatient Procedure:                Colonoscopy with Polypectomy. Indications:              Hematochezia, Personal history of colonic polyps,                            Abnormal CT of the GI tract Providers:                Jeneen Rinks L. Gloriana Piltz Dr., MD, Cleda Daub, RN,                            Alan Mulder, Technician, Glenis Smoker, CRNA Referring MD:              Medicines:                Monitored Anesthesia Care Complications:            No immediate complications. Estimated Blood Loss:     Estimated blood loss: none. Procedure:                Pre-Anesthesia Assessment:                           - Prior to the procedure, a History and Physical                            was performed, and patient medications and                            allergies were reviewed. The patient's tolerance of                            previous anesthesia was also reviewed. The risks                            and benefits of the procedure and the sedation                            options and risks were discussed with the patient.                            All questions were answered, and informed consent                            was obtained. Prior Anticoagulants: The patient has                            taken no previous anticoagulant or antiplatelet                            agents. ASA Grade Assessment: II - A patient with  mild systemic disease. After reviewing the risks                            and benefits, the patient was deemed in                            satisfactory condition to undergo the procedure.                           - Prior to the procedure, a History and Physical                            was performed, and patient medications and                      allergies were reviewed. The patient's tolerance of                            previous anesthesia was also reviewed. The risks                            and benefits of the procedure and the sedation                            options and risks were discussed with the patient.                            All questions were answered, and informed consent                            was obtained. Prior Anticoagulants: The patient has                            taken no previous anticoagulant or antiplatelet                            agents. ASA Grade Assessment: II - A patient with                            mild systemic disease. After reviewing the risks                            and benefits, the patient was deemed in                            satisfactory condition to undergo the procedure.                           After obtaining informed consent, the colonoscope                            was passed under direct vision. Throughout the  procedure, the patient's blood pressure, pulse, and                            oxygen saturations were monitored continuously. The                            EC-[id number] ([id number]) scope was introduced through                            the anus and advanced to the the cecum, identified                            by appendiceal orifice and ileocecal valve. The                            colonoscopy was somewhat difficult due to                            significant looping. Successful completion of the                            procedure was aided by use of ultrathin                            colonoscope. The patient tolerated the procedure                            well. The quality of the bowel preparation was                            good. The ileocecal valve, appendiceal orifice, and                            rectum were photographed. Scope In: 9:15:09 AM Scope Out: 9:52:21 AM Scope Withdrawal  Time: 0 hours 16 minutes 17 seconds  Total Procedure Duration: 0 hours 37 minutes 12 seconds  Findings:      The perianal and digital rectal examinations were normal.      A 4 mm polyp was found in the ascending colon. The polyp was sessile.       The polyp was removed with a cold snare. Resection and retrieval were       complete.      Multiple medium-mouthed diverticula were found in the sigmoid colon and       descending colon.      Non-bleeding internal hemorrhoids were found during retroflexion. The       hemorrhoids were medium-sized and Grade I (internal hemorrhoids that do       not prolapse). Impression:               - One 4 mm polyp in the ascending colon, removed                            with a cold snare. Resected and retrieved.                           -  Diverticulosis in the sigmoid colon and in the                            descending colon.                           - Non-bleeding internal hemorrhoids.                           - Personal history of colonic polyps.                           - Abnormal Imaging normal endoscopic exam Moderate Sedation:      MAC by anesthesia Recommendation:           - Patient has a contact number available for                            emergencies. The signs and symptoms of potential                            delayed complications were discussed with the                            patient. Return to normal activities tomorrow.                            Written discharge instructions were provided to the                            patient.                           - Resume previous diet.                           - Continue present medications.                           - No aspirin, ibuprofen, naproxen, or other                            non-steroidal anti-inflammatory drugs for 3 days                            after polyp removal.                           - No repeat colonoscopy due to age.                           -  Return to endoscopist in 3 months. Procedure Code(s):        --- Professional ---                           [phone], Colonoscopy, flexible; with removal of  tumor(s), polyp(s), or other lesion(s) by snare                            technique Diagnosis Code(s):        --- Professional ---                           D12.2, Benign neoplasm of ascending colon                           Z86.010, Personal history of colonic polyps                           K92.1, Melena (includes Hematochezia)                           K57.30, Diverticulosis of large intestine without                            perforation or abscess without bleeding                           R93.3, Abnormal findings on diagnostic imaging of                            other parts of digestive tract CPT copyright 2016 American Medical Association. All rights reserved. The codes documented in this report are preliminary and upon coder review may  be revised to meet current compliance requirements. Nancy Fetter Dr., MD 02/04/2018 10:05:04 AM This report has been signed electronically. Number of Addenda: 0

## 2018-02-04 NOTE — Anesthesia Postprocedure Evaluation (Signed)
Anesthesia Post Note  Patient: Carol [name]  Procedure(s) Performed: COLONOSCOPY WITH PROPOFOL (N/A )     Patient location during evaluation: PACU Anesthesia Type: MAC Level of consciousness: awake and alert Pain management: pain level controlled Vital Signs Assessment: post-procedure vital signs reviewed and stable Respiratory status: spontaneous breathing, nonlabored ventilation, respiratory function stable and patient connected to nasal cannula oxygen Cardiovascular status: stable and blood pressure returned to baseline Postop Assessment: no apparent nausea or vomiting Anesthetic complications: no    Last Vitals:  Vitals:   02/04/18 1002 02/04/18 1010  BP: (!) 116/46 134/65  Pulse: 77 76  Resp: (!) 21 (!) 23  Temp:    SpO2: 97% 96%    Last Pain:  Vitals:   02/04/18 0740  TempSrc: Oral                 Mance Vallejo S

## 2018-02-04 NOTE — Transfer of Care (Signed)
Immediate Anesthesia Transfer of Care Note  Patient: Carol [name]  Procedure(s) Performed: COLONOSCOPY WITH PROPOFOL (N/A )  Patient Location: PACU and Endoscopy Unit  Anesthesia Type:MAC  Level of Consciousness: awake and alert   Airway & Oxygen Therapy: Patient Spontanous Breathing and Patient connected to face mask oxygen  Post-op Assessment: Report given to RN and Post -op Vital signs reviewed and stable  Post vital signs: Reviewed and stable  Last Vitals:  Vitals:   02/04/18 0740  BP: (!) 150/51  Pulse: 88  Resp: (!) 25  Temp: 36.8 C  SpO2: 92%    Last Pain:  Vitals:   02/04/18 0740  TempSrc: Oral         Complications: No apparent anesthesia complications

## 2018-02-04 NOTE — Discharge Instructions (Signed)

## 2018-02-06 ENCOUNTER — Encounter (HOSPITAL_COMMUNITY): Payer: Self-pay | Admitting: Gastroenterology

## 2018-02-12 DIAGNOSIS — C44311 Basal cell carcinoma of skin of nose: Secondary | ICD-10-CM | POA: Diagnosis not present

## 2018-03-12 DIAGNOSIS — X32XXXD Exposure to sunlight, subsequent encounter: Secondary | ICD-10-CM | POA: Diagnosis not present

## 2018-03-12 DIAGNOSIS — L570 Actinic keratosis: Secondary | ICD-10-CM | POA: Diagnosis not present

## 2018-03-12 DIAGNOSIS — Z08 Encounter for follow-up examination after completed treatment for malignant neoplasm: Secondary | ICD-10-CM | POA: Diagnosis not present

## 2018-03-12 DIAGNOSIS — Z85828 Personal history of other malignant neoplasm of skin: Secondary | ICD-10-CM | POA: Diagnosis not present

## 2018-04-09 DIAGNOSIS — H6983 Other specified disorders of Eustachian tube, bilateral: Secondary | ICD-10-CM | POA: Diagnosis not present

## 2018-04-09 DIAGNOSIS — H903 Sensorineural hearing loss, bilateral: Secondary | ICD-10-CM | POA: Diagnosis not present

## 2018-04-09 DIAGNOSIS — J309 Allergic rhinitis, unspecified: Secondary | ICD-10-CM | POA: Diagnosis not present

## 2018-04-09 DIAGNOSIS — H6993 Unspecified Eustachian tube disorder, bilateral: Secondary | ICD-10-CM | POA: Insufficient documentation

## 2018-05-22 DIAGNOSIS — K5792 Diverticulitis of intestine, part unspecified, without perforation or abscess without bleeding: Secondary | ICD-10-CM | POA: Diagnosis not present

## 2018-05-22 DIAGNOSIS — K5900 Constipation, unspecified: Secondary | ICD-10-CM | POA: Diagnosis not present

## 2018-09-12 DIAGNOSIS — M810 Age-related osteoporosis without current pathological fracture: Secondary | ICD-10-CM | POA: Diagnosis not present

## 2018-09-12 DIAGNOSIS — R399 Unspecified symptoms and signs involving the genitourinary system: Secondary | ICD-10-CM | POA: Diagnosis not present

## 2018-09-12 DIAGNOSIS — I10 Essential (primary) hypertension: Secondary | ICD-10-CM | POA: Diagnosis not present

## 2018-09-12 DIAGNOSIS — H6981 Other specified disorders of Eustachian tube, right ear: Secondary | ICD-10-CM | POA: Diagnosis not present

## 2018-09-12 DIAGNOSIS — F419 Anxiety disorder, unspecified: Secondary | ICD-10-CM | POA: Diagnosis not present

## 2018-09-12 DIAGNOSIS — F324 Major depressive disorder, single episode, in partial remission: Secondary | ICD-10-CM | POA: Diagnosis not present

## 2018-09-12 DIAGNOSIS — E7800 Pure hypercholesterolemia, unspecified: Secondary | ICD-10-CM | POA: Diagnosis not present

## 2018-09-12 DIAGNOSIS — K219 Gastro-esophageal reflux disease without esophagitis: Secondary | ICD-10-CM | POA: Diagnosis not present

## 2018-09-12 DIAGNOSIS — B356 Tinea cruris: Secondary | ICD-10-CM | POA: Diagnosis not present

## 2018-09-12 DIAGNOSIS — Z23 Encounter for immunization: Secondary | ICD-10-CM | POA: Diagnosis not present

## 2018-09-12 DIAGNOSIS — Z0000 Encounter for general adult medical examination without abnormal findings: Secondary | ICD-10-CM | POA: Diagnosis not present

## 2019-01-08 ENCOUNTER — Other Ambulatory Visit: Payer: Self-pay | Admitting: Family Medicine

## 2019-01-08 DIAGNOSIS — Z1231 Encounter for screening mammogram for malignant neoplasm of breast: Secondary | ICD-10-CM

## 2019-01-27 ENCOUNTER — Ambulatory Visit: Payer: Medicare Other

## 2019-02-24 ENCOUNTER — Ambulatory Visit: Payer: Medicare Other

## 2019-03-20 DIAGNOSIS — F17200 Nicotine dependence, unspecified, uncomplicated: Secondary | ICD-10-CM | POA: Diagnosis not present

## 2019-03-20 DIAGNOSIS — M810 Age-related osteoporosis without current pathological fracture: Secondary | ICD-10-CM | POA: Diagnosis not present

## 2019-03-20 DIAGNOSIS — F419 Anxiety disorder, unspecified: Secondary | ICD-10-CM | POA: Diagnosis not present

## 2019-03-20 DIAGNOSIS — E7800 Pure hypercholesterolemia, unspecified: Secondary | ICD-10-CM | POA: Diagnosis not present

## 2019-03-20 DIAGNOSIS — F324 Major depressive disorder, single episode, in partial remission: Secondary | ICD-10-CM | POA: Diagnosis not present

## 2019-03-20 DIAGNOSIS — J301 Allergic rhinitis due to pollen: Secondary | ICD-10-CM | POA: Diagnosis not present

## 2019-03-20 DIAGNOSIS — K219 Gastro-esophageal reflux disease without esophagitis: Secondary | ICD-10-CM | POA: Diagnosis not present

## 2019-03-20 DIAGNOSIS — I10 Essential (primary) hypertension: Secondary | ICD-10-CM | POA: Diagnosis not present

## 2019-04-09 DIAGNOSIS — N760 Acute vaginitis: Secondary | ICD-10-CM | POA: Diagnosis not present

## 2019-04-09 DIAGNOSIS — J300 Vasomotor rhinitis: Secondary | ICD-10-CM | POA: Diagnosis not present

## 2019-04-09 DIAGNOSIS — F419 Anxiety disorder, unspecified: Secondary | ICD-10-CM | POA: Diagnosis not present

## 2019-04-14 ENCOUNTER — Ambulatory Visit: Payer: Medicare Other

## 2019-04-24 ENCOUNTER — Encounter (HOSPITAL_BASED_OUTPATIENT_CLINIC_OR_DEPARTMENT_OTHER): Payer: Self-pay | Admitting: Emergency Medicine

## 2019-04-24 ENCOUNTER — Inpatient Hospital Stay (HOSPITAL_BASED_OUTPATIENT_CLINIC_OR_DEPARTMENT_OTHER)
Admission: EM | Admit: 2019-04-24 | Discharge: 2019-04-27 | DRG: 206 | Disposition: A | Discharge status: Home / Self Care | Payer: Medicare Other | Attending: Internal Medicine | Admitting: Internal Medicine

## 2019-04-24 ENCOUNTER — Emergency Department (HOSPITAL_BASED_OUTPATIENT_CLINIC_OR_DEPARTMENT_OTHER): Payer: Medicare Other

## 2019-04-24 ENCOUNTER — Other Ambulatory Visit: Payer: Self-pay

## 2019-04-24 DIAGNOSIS — J432 Centrilobular emphysema: Secondary | ICD-10-CM | POA: Diagnosis present

## 2019-04-24 DIAGNOSIS — N3000 Acute cystitis without hematuria: Secondary | ICD-10-CM | POA: Diagnosis present

## 2019-04-24 DIAGNOSIS — F419 Anxiety disorder, unspecified: Secondary | ICD-10-CM | POA: Diagnosis not present

## 2019-04-24 DIAGNOSIS — E538 Deficiency of other specified B group vitamins: Secondary | ICD-10-CM | POA: Diagnosis present

## 2019-04-24 DIAGNOSIS — K921 Melena: Secondary | ICD-10-CM | POA: Diagnosis not present

## 2019-04-24 DIAGNOSIS — E860 Dehydration: Secondary | ICD-10-CM | POA: Diagnosis present

## 2019-04-24 DIAGNOSIS — I10 Essential (primary) hypertension: Secondary | ICD-10-CM

## 2019-04-24 DIAGNOSIS — F17210 Nicotine dependence, cigarettes, uncomplicated: Secondary | ICD-10-CM | POA: Diagnosis present

## 2019-04-24 DIAGNOSIS — B356 Tinea cruris: Secondary | ICD-10-CM | POA: Diagnosis not present

## 2019-04-24 DIAGNOSIS — K922 Gastrointestinal hemorrhage, unspecified: Secondary | ICD-10-CM | POA: Diagnosis not present

## 2019-04-24 DIAGNOSIS — J449 Chronic obstructive pulmonary disease, unspecified: Secondary | ICD-10-CM | POA: Diagnosis present

## 2019-04-24 DIAGNOSIS — Z20828 Contact with and (suspected) exposure to other viral communicable diseases: Secondary | ICD-10-CM | POA: Diagnosis not present

## 2019-04-24 DIAGNOSIS — K219 Gastro-esophageal reflux disease without esophagitis: Secondary | ICD-10-CM | POA: Diagnosis present

## 2019-04-24 DIAGNOSIS — M810 Age-related osteoporosis without current pathological fracture: Secondary | ICD-10-CM | POA: Diagnosis present

## 2019-04-24 DIAGNOSIS — Z720 Tobacco use: Secondary | ICD-10-CM | POA: Diagnosis not present

## 2019-04-24 DIAGNOSIS — N3090 Cystitis, unspecified without hematuria: Secondary | ICD-10-CM | POA: Diagnosis not present

## 2019-04-24 DIAGNOSIS — K644 Residual hemorrhoidal skin tags: Secondary | ICD-10-CM | POA: Diagnosis present

## 2019-04-24 DIAGNOSIS — R7989 Other specified abnormal findings of blood chemistry: Secondary | ICD-10-CM | POA: Diagnosis present

## 2019-04-24 DIAGNOSIS — R0902 Hypoxemia: Secondary | ICD-10-CM | POA: Diagnosis not present

## 2019-04-24 DIAGNOSIS — B3749 Other urogenital candidiasis: Secondary | ICD-10-CM | POA: Diagnosis not present

## 2019-04-24 HISTORY — DX: Noninfective gastroenteritis and colitis, unspecified: K52.9

## 2019-04-24 LAB — URINALYSIS, ROUTINE W REFLEX MICROSCOPIC
Bilirubin Urine: NEGATIVE
Glucose, UA: NEGATIVE mg/dL
Hgb urine dipstick: NEGATIVE
Ketones, ur: NEGATIVE mg/dL
Nitrite: POSITIVE — AB
Protein, ur: NEGATIVE mg/dL
Specific Gravity, Urine: 1.01 (ref 1.005–1.030)
pH: 6 (ref 5.0–8.0)

## 2019-04-24 LAB — COMPREHENSIVE METABOLIC PANEL
ALT: 10 U/L (ref 0–44)
AST: 15 U/L (ref 15–41)
Albumin: 3.7 g/dL (ref 3.5–5.0)
Alkaline Phosphatase: 37 U/L — ABNORMAL LOW (ref 38–126)
Anion gap: 9 (ref 5–15)
BUN: 10 mg/dL (ref 8–23)
CO2: 27 mmol/L (ref 22–32)
Calcium: 8.8 mg/dL — ABNORMAL LOW (ref 8.9–10.3)
Chloride: 98 mmol/L (ref 98–111)
Creatinine, Ser: 0.52 mg/dL (ref 0.44–1.00)
GFR calc Af Amer: >60 mL/min (ref >60)
GFR calc non Af Amer: >60 mL/min (ref >60)
Glucose, Bld: 103 mg/dL — ABNORMAL HIGH (ref 70–99)
Potassium: 4.2 mmol/L (ref 3.5–5.1)
Sodium: 134 mmol/L — ABNORMAL LOW (ref 135–145)
Total Bilirubin: 0.5 mg/dL (ref 0.3–1.2)
Total Protein: 6.6 g/dL (ref 6.5–8.1)

## 2019-04-24 LAB — C-REACTIVE PROTEIN: CRP: <0.8 mg/dL (ref <1.0)

## 2019-04-24 LAB — CBC WITH DIFFERENTIAL/PLATELET
Abs Immature Granulocytes: 0.03 10*3/uL (ref 0.00–0.07)
Basophils Absolute: 0.1 10*3/uL (ref 0.0–0.1)
Basophils Relative: 0 %
Eosinophils Absolute: 0.1 10*3/uL (ref 0.0–0.5)
Eosinophils Relative: 1 %
HCT: 42.8 % (ref 36.0–46.0)
Hemoglobin: 14 g/dL (ref 12.0–15.0)
Immature Granulocytes: 0 %
Lymphocytes Relative: 12 %
Lymphs Abs: 1.3 10*3/uL (ref 0.7–4.0)
MCH: 34.1 pg — ABNORMAL HIGH (ref 26.0–34.0)
MCHC: 32.7 g/dL (ref 30.0–36.0)
MCV: 104.4 fL — ABNORMAL HIGH (ref 80.0–100.0)
Monocytes Absolute: 0.9 10*3/uL (ref 0.1–1.0)
Monocytes Relative: 8 %
Neutro Abs: 8.9 10*3/uL — ABNORMAL HIGH (ref 1.7–7.7)
Neutrophils Relative %: 79 %
Platelets: 264 10*3/uL (ref 150–400)
RBC: 4.1 MIL/uL (ref 3.87–5.11)
RDW: 13.6 % (ref 11.5–15.5)
WBC: 11.3 10*3/uL — ABNORMAL HIGH (ref 4.0–10.5)
nRBC: 0 % (ref 0.0–0.2)

## 2019-04-24 LAB — URINALYSIS, MICROSCOPIC (REFLEX): RBC / HPF: NONE SEEN RBC/hpf (ref 0–5)

## 2019-04-24 LAB — VITAMIN B12: Vitamin B-12: 240 pg/mL (ref 180–914)

## 2019-04-24 LAB — SARS CORONAVIRUS 2 AG (30 MIN TAT): SARS Coronavirus 2 Ag: NEGATIVE

## 2019-04-24 MED ORDER — ALBUTEROL SULFATE HFA 108 (90 BASE) MCG/ACT IN AERS
2.0000 | INHALATION_SPRAY | Freq: Once | RESPIRATORY_TRACT | Status: AC
  Administered 2019-04-24: 2 via RESPIRATORY_TRACT
  Filled 2019-04-24: qty 6.7

## 2019-04-24 MED ORDER — SIMVASTATIN 20 MG PO TABS
20.0000 mg | ORAL_TABLET | Freq: Every day | ORAL | Status: DC
  Administered 2019-04-24 – 2019-04-26 (×3): 20 mg via ORAL
  Filled 2019-04-24 (×3): qty 1

## 2019-04-24 MED ORDER — ESCITALOPRAM OXALATE 10 MG PO TABS
10.0000 mg | ORAL_TABLET | Freq: Every day | ORAL | Status: DC
  Administered 2019-04-24 – 2019-04-26 (×3): 10 mg via ORAL
  Filled 2019-04-24 (×3): qty 1

## 2019-04-24 MED ORDER — IOHEXOL 350 MG/ML SOLN
100.0000 mL | Freq: Once | INTRAVENOUS | Status: AC | PRN
  Administered 2019-04-24: 64 mL via INTRAVENOUS

## 2019-04-24 MED ORDER — CALCIUM POLYCARBOPHIL 625 MG PO TABS
625.0000 mg | ORAL_TABLET | Freq: Every day | ORAL | Status: DC
  Administered 2019-04-24 – 2019-04-27 (×4): 625 mg via ORAL
  Filled 2019-04-24 (×4): qty 1

## 2019-04-24 MED ORDER — NICOTINE 21 MG/24HR TD PT24
21.0000 mg | MEDICATED_PATCH | TRANSDERMAL | Status: DC
  Administered 2019-04-25 – 2019-04-26 (×2): 21 mg via TRANSDERMAL
  Filled 2019-04-24: qty 1

## 2019-04-24 MED ORDER — AMLODIPINE BESYLATE 5 MG PO TABS
5.0000 mg | ORAL_TABLET | Freq: Every day | ORAL | Status: DC
  Administered 2019-04-24 – 2019-04-25 (×2): 5 mg via ORAL
  Filled 2019-04-24 (×2): qty 1

## 2019-04-24 MED ORDER — SODIUM CHLORIDE 0.9% FLUSH: 3.0000 mL | INTRAVENOUS | Status: DC | PRN

## 2019-04-24 MED ORDER — PANTOPRAZOLE SODIUM 40 MG PO TBEC
40.0000 mg | DELAYED_RELEASE_TABLET | Freq: Every day | ORAL | Status: DC
  Administered 2019-04-24 – 2019-04-27 (×4): 40 mg via ORAL
  Filled 2019-04-24 (×4): qty 1

## 2019-04-24 MED ORDER — NYSTATIN 100000 UNIT/GM EX POWD
Freq: Once | CUTANEOUS | Status: AC
  Administered 2019-04-24: 16:00:00 via TOPICAL
  Filled 2019-04-24: qty 15

## 2019-04-24 MED ORDER — SODIUM CHLORIDE 0.9 % IV SOLN: 250.0000 mL | INTRAVENOUS | Status: DC | PRN

## 2019-04-24 MED ORDER — CEFDINIR 300 MG PO CAPS
300.0000 mg | ORAL_CAPSULE | Freq: Two times a day (BID) | ORAL | Status: DC
  Administered 2019-04-24 – 2019-04-27 (×6): 300 mg via ORAL
  Filled 2019-04-24 (×6): qty 1

## 2019-04-24 MED ORDER — SODIUM CHLORIDE 0.9 % IV BOLUS
500.0000 mL | Freq: Once | INTRAVENOUS | Status: AC
  Administered 2019-04-24: 500 mL via INTRAVENOUS

## 2019-04-24 MED ORDER — SODIUM CHLORIDE 0.9% FLUSH
3.0000 mL | Freq: Two times a day (BID) | INTRAVENOUS | Status: DC
  Administered 2019-04-24 – 2019-04-27 (×4): 3 mL via INTRAVENOUS

## 2019-04-24 MED ORDER — RALOXIFENE HCL 60 MG PO TABS
60.0000 mg | ORAL_TABLET | Freq: Every day | ORAL | Status: DC
  Administered 2019-04-24 – 2019-04-27 (×4): 60 mg via ORAL
  Filled 2019-04-24 (×4): qty 1

## 2019-04-24 MED ORDER — BENAZEPRIL HCL 10 MG PO TABS
20.0000 mg | ORAL_TABLET | Freq: Every day | ORAL | Status: DC
  Administered 2019-04-24 – 2019-04-25 (×2): 20 mg via ORAL
  Filled 2019-04-24 (×2): qty 2

## 2019-04-24 MED ORDER — ALPRAZOLAM 0.25 MG PO TABS
0.2500 mg | ORAL_TABLET | Freq: Two times a day (BID) | ORAL | Status: DC | PRN
  Administered 2019-04-24 – 2019-04-27 (×5): 0.25 mg via ORAL
  Filled 2019-04-24 (×5): qty 1

## 2019-04-24 MED ORDER — TERBINAFINE HCL 250 MG PO TABS
250.0000 mg | ORAL_TABLET | Freq: Every day | ORAL | Status: DC
  Administered 2019-04-24 – 2019-04-27 (×4): 250 mg via ORAL
  Filled 2019-04-24 (×4): qty 1

## 2019-04-24 MED ORDER — NICOTINE 21 MG/24HR TD PT24
21.0000 mg | MEDICATED_PATCH | Freq: Once | TRANSDERMAL | Status: AC
  Administered 2019-04-24: 21 mg via TRANSDERMAL
  Filled 2019-04-24 (×2): qty 1

## 2019-04-24 NOTE — ED Triage Notes (Signed)
Rectal bleeding since yesterday with cramping when she is having a bowel movement.  Pt also sts she is being treated for a yeast infection "in my privates", but it is not working.  Has appt with OB next week but sts "I can't take it that long."

## 2019-04-24 NOTE — H&P (Signed)
History and Physical    Carol A Arth OHY:[id number] DOB: [birth year] DOA: 04/24/2019  PCP: Shirline Frees, MD  Patient coming from: home by way of [facility] ED   Chief Complaint: rectal bleeding  HPI: Carol [name] is a 83 y.o. female with medical history significant for htn, 53 pack-year smoking history, osteoporosis, and anxiety, who presents with above.  Had an episode of rectal bleeding a little over a year ago. At that time had pain and diarrhea. Treated for infectious colitis. Thought possibly ischemic. F/u colonoscopy a month later saw diverticulosis, internal hemorrhoids, and one small polyp. Otherwise unremarkable.   Denies prior history of rectal bleeding.  States was in usual state of health when last night felt urge to defecate. Had a large volume bowel movement that toward the end became soft and watery. Did not notice blood or mucous. Has had occasional constipation lately and was recently started on miralax; says recent bowel movements have been relatively regular.  This morning felt the urge to pass flatus and when she did it was in fact blood, a little more than the amount of a menstrual period. That happened once more today. She presented to the [facility] ED to evaluate this.  Denies abdominal pain. Denies fever. Denies shortness of breath. Has chronic occasional cough but no change from that baseline. Denies significant or worsening dyspnea on exertion or at rest.   Patient also notes increased urinary frequency last few days. No blood in urine. No fevers or flank pain.  Patient also reports several weeks itchy burning rash in groin. PCP has been treating telephonically, first with creams, then with a pill she took once and then again in a week. That pill helped some but how has returned. Very itchy.    ED Course: hypoxic to 80 when ambulating. Labs, CTA.  Review of Systems: As per HPI otherwise 10 point review of systems negative.    Past Medical History:   Diagnosis Date  . Anxiety   . Arthritis    arthritis -hips  . Colitis   . Complication of anesthesia    B/P dropped post colonoscopy x1  . GERD (gastroesophageal reflux disease)   . Headache(784.0)    migraines rare occ.  Marland Kitchen Hypertension   . Urinary tract infection early aug 2014    Past Surgical History:  Procedure Laterality Date  . CATARACT EXTRACTION, BILATERAL Bilateral   . COLONOSCOPY WITH PROPOFOL N/A 08/03/2013   Procedure: COLONOSCOPY WITH PROPOFOL;  Surgeon: Winfield Cunas., MD;  Location: [facility] ENDOSCOPY;  Service: Endoscopy;  Laterality: N/A;  ultra thin colon scope  . COLONOSCOPY WITH PROPOFOL N/A 02/04/2018   Procedure: COLONOSCOPY WITH PROPOFOL;  Surgeon: Laurence Spates, MD;  Location: [facility] ENDOSCOPY;  Service: Endoscopy;  Laterality: N/A;  . LASIK       reports that she has been smoking cigarettes. She has a 40.00 pack-year smoking history. She has never used smokeless tobacco. She reports that she does not drink alcohol or use drugs.  Allergies  Allergen Reactions  . Ciprofloxacin Nausea Only and Other (See Comments)    Nausea and legs weak  . Nitrofuran Derivatives Nausea Only and Other (See Comments)    nitrofuratonin bad nausea, legs weak  . Sulfa Antibiotics Other (See Comments)    jittery    No family history on file.  Prior to Admission medications   Medication Sig Start Date End Date Taking? Authorizing Provider  acetaminophen (TYLENOL) 650 MG CR tablet Take 1,300 mg by mouth daily  as needed for pain.    Yes [provider]  ALPRAZolam (XANAX) 0.25 MG tablet Take 0.25 mg by mouth 2 (two) times daily as needed for anxiety.   Yes [provider]  amLODipine (NORVASC) 5 MG tablet Take 5 mg by mouth daily. 11/14/17  Yes [provider]  benazepril (LOTENSIN) 20 MG tablet Take 20 mg by mouth daily. 11/27/17  Yes [provider]  Calcium Carbonate-Vit D-Min (CALTRATE 600+D PLUS PO) Take 1 tablet by mouth daily.   Yes  [provider]  escitalopram (LEXAPRO) 10 MG tablet Take 10 mg by mouth at bedtime.    Yes [provider]  MAGNESIUM CARBONATE PO Take 1 tablet by mouth daily.   Yes [provider]  Multiple Vitamin (MULTIVITAMIN WITH MINERALS) TABS tablet Take 1 tablet by mouth daily.   Yes [provider]  pantoprazole (PROTONIX) 40 MG tablet Take 40 mg by mouth daily. 10/28/17  Yes [provider]  polycarbophil (FIBERCON) 625 MG tablet Take 625 mg by mouth daily.   Yes [provider]  Probiotic Product (ALIGN PO) Take 1 tablet by mouth daily.   Yes [provider]  raloxifene (EVISTA) 60 MG tablet Take 60 mg by mouth daily. 12/30/17  Yes [provider]  simvastatin (ZOCOR) 20 MG tablet Take 20 mg by mouth at bedtime.    Yes [provider]  ondansetron (ZOFRAN ODT) 4 MG disintegrating tablet Take 1 tablet (4 mg total) by mouth every 8 (eight) hours as needed for nausea or vomiting. Patient not taking: Reported on 01/23/2018 01/06/18   Mendel Corning, MD    Physical Exam: Vitals:   04/24/19 1220 04/24/19 1403 04/24/19 1750 04/24/19 1751  BP:  94/79  133/65  Pulse:  92  88  Resp:  (!) 22  14  Temp:    98.7 F (37.1 C)  TempSrc:    Oral  SpO2: (!) 89% (!) 89%  100%  Weight:   47.7 kg   Height:   5\' 1"  (1.549 m)     Constitutional: No acute distress Head: Atraumatic Eyes: Conjunctiva clear ENM: Moist mucous membranes. Normal dentition.  Neck: Supple Respiratory: Clear to auscultation bilaterally, no wheezing/rales/rhonchi safe for mild rales at bases. Normal respiratory effort. No accessory muscle use. . Cardiovascular: Regular rate and rhythm. Soft systolic murmur Abdomen: Non-tender, non-distended. No masses. No rebound or guarding. Positive bowel sounds. Musculoskeletal: No joint deformity upper and lower extremities. Normal ROM, no contractures. Normal muscle tone.  Skin: erythema and scale b/l groin w/ satellite  lesions Extremities: No peripheral edema. Palpable peripheral pulses. Neurologic: Alert, moving all 4 extremities. Psychiatric: Normal insight and judgement.   Labs on Admission: I have personally reviewed following labs and imaging studies  CBC: Recent Labs  Lab 04/24/19 1100  WBC 11.3*  NEUTROABS 8.9*  HGB 14.0  HCT 42.8  MCV 104.4*  PLT 361   Basic Metabolic Panel: Recent Labs  Lab 04/24/19 1100  NA 134*  K 4.2  CL 98  CO2 27  GLUCOSE 103*  BUN 10  CREATININE 0.52  CALCIUM 8.8*   GFR: Estimated Creatinine Clearance: 42.2 mL/min (by C-G formula based on SCr of 0.52 mg/dL). Liver Function Tests: Recent Labs  Lab 04/24/19 1100  AST 15  ALT 10  ALKPHOS 37*  BILITOT 0.5  PROT 6.6  ALBUMIN 3.7   No results for input(s): LIPASE, AMYLASE in the last 168 hours. No results for input(s): AMMONIA in the last 168  hours. Coagulation Profile: No results for input(s): INR, PROTIME in the last 168 hours. Cardiac Enzymes: No results for input(s): CKTOTAL, CKMB, CKMBINDEX, TROPONINI in the last 168 hours. BNP (last 3 results) No results for input(s): PROBNP in the last 8760 hours. HbA1C: No results for input(s): HGBA1C in the last 72 hours. CBG: No results for input(s): GLUCAP in the last 168 hours. Lipid Profile: No results for input(s): CHOL, HDL, LDLCALC, TRIG, CHOLHDL, LDLDIRECT in the last 72 hours. Thyroid Function Tests: No results for input(s): TSH, T4TOTAL, FREET4, T3FREE, THYROIDAB in the last 72 hours. Anemia Panel: No results for input(s): VITAMINB12, FOLATE, FERRITIN, TIBC, IRON, RETICCTPCT in the last 72 hours. Urine analysis:    Component Value Date/Time   COLORURINE YELLOW 04/24/2019 [place] 04/24/2019 1156   LABSPEC 1.010 04/24/2019 1156   PHURINE 6.0 04/24/2019 1156   [place] 04/24/2019 1156   [place] 04/24/2019 [place] 04/24/2019 1156   BILIRUBINUR neg 07/05/2013 [place] 04/24/2019 [place] 04/24/2019 1156   UROBILINOGEN 0.2 09/03/2015 1220   NITRITE POSITIVE (A) 04/24/2019 1156   LEUKOCYTESUR TRACE (A) 04/24/2019 1156    Radiological Exams on Admission: Ct Angio Chest Pe W/cm &/or Wo Cm  Result Date: 04/24/2019 CLINICAL DATA:  Hypoxia. EXAM: CT ANGIOGRAPHY CHEST WITH CONTRAST TECHNIQUE: Multidetector CT imaging of the chest was performed using the standard protocol during bolus administration of intravenous contrast. Multiplanar CT image reconstructions and MIPs were obtained to evaluate the vascular anatomy. CONTRAST:  32mL OMNIPAQUE IOHEXOL 350 MG/ML SOLN COMPARISON:  Radiograph of same day. FINDINGS: Cardiovascular: Satisfactory opacification of the pulmonary arteries to the segmental level. No evidence of pulmonary embolism. Normal heart size. No pericardial effusion. Atherosclerosis of thoracic aorta is noted without aneurysm or dissection. Mediastinum/Nodes: No enlarged mediastinal, hilar, or axillary lymph nodes. Thyroid gland, trachea, and esophagus demonstrate no significant findings. Lungs/Pleura: No pneumothorax or pleural effusion is noted. Minimal right posterior basilar subsegmental atelectasis is noted. Focal opacity is noted posteriorly in the right upper lobe which may represent focal atelectasis or inflammation. Upper Abdomen: No acute abnormality. Musculoskeletal: No chest wall abnormality. No acute or significant osseous findings. Review of the MIP images confirms the above findings. IMPRESSION: No definite evidence of pulmonary embolus. Small focal opacity is noted posteriorly in the right upper lobe which may represent focal atelectasis or inflammation. Aortic Atherosclerosis (ICD10-I70.0). Electronically Signed   By: Marijo Conception M.D.   On: 04/24/2019 13:20   Dg Chest Port 1 View  Result Date: 04/24/2019 CLINICAL DATA:  Hypoxia, bleeding EXAM: PORTABLE CHEST 1 VIEW COMPARISON:  01/05/2018 FINDINGS: The lungs are  hyperinflated likely secondary to COPD. There is no focal parenchymal opacity. There is no pleural effusion or pneumothorax. The heart and mediastinal contours are unremarkable. The osseous structures are unremarkable. IMPRESSION: No active disease. Electronically Signed   By: Kathreen Devoid   On: 04/24/2019 12:17     Assessment/Plan Principal Problem:   Hematochezia Active Problems:   Hypoxia   Tobacco abuse   Anxiety   Osteoporosis   Essential hypertension   Cystitis   Tinea cruris   # Hematochezia - favor internal hemorrhoids given known hx of, painless bleeding, relatively small amount. Recent hx of constipation and large BM yesterday also fit this picture. No abdominal pain to suggest ischemic colitis. Save for one episode of loose stools yesterday no diarrhea, abd pain/cramping, or fevers to suggest infectious etiology. Diverticular bleed  also possible. Hgb wnl, hemodynamically stable. - AM CBC - if remains clinically stable, likely outpt f/u  # Hypoxia - asymptomatic. Hyperinflation on CXR and 53 pack-year smoking hx, though no formal dx of copd and appears relatively asymptoamtic at baseline. On my exam off oxygen resting o2 is 87. In [facility] apparently desat to 80 on ambulation. CTA neg for pe, negative for infection. covid test negative.  - continue [place] O2 for now - assuming copd, goal resting o2 is in the 88-92 range, and there isn't convincing evidence oxygen purely for desats with ambulation is indicated. Continued monitoring during this inpatient stay will help [place] determine if oxygen therapy warranted - likely outpt spirometry  # Tinea cruris - failed outpt creams and what sounds like oral fluconazole. Appears much more to be tinea than skin manifestation of UC, but will check crp - terbinafine 250 qd - f/u crp  # Acute cystitis - nitrites in urine, and endorsing recent urinary frequency. Several abx allergies - cefdinir bid, f/u culture  # Macrocytosis - hgb wnl - f/u b12,  folate  # tobacco abuse - nicotine patch  # htn - here normotensive - continue home amlodipine, benazepril, simvastatin  # anxiety disorder - continue home escitalopram and alprazolam prn  DVT prophylaxis: scds, holding pharmacologic given active bleeding Code Status: full  Family Communication: son sid cush [phone]  Disposition Plan: tbd  Consults called: none  Admission status: med/surg    Desma Maxim MD [facility] Pager [phone]  If 7PM-7AM, please contact night-coverage [email or web address] Password [facility]  04/24/2019, 8:36 PM

## 2019-04-24 NOTE — ED Notes (Signed)
Patient transported to CT 

## 2019-04-24 NOTE — ED Provider Notes (Signed)
[facility] EMERGENCY DEPARTMENT Provider Note   CSN: [id number] Arrival date & time: 04/24/19  1013    History   Chief Complaint Chief Complaint  Patient presents with   Rectal Bleeding    HPI Carol [name] is a 83 y.o. female with past medical history of hypertension, rectal bleeding, GERD, presenting to the emergency department with rectal bleeding that began today.  Patient states she had lower abdominal cramping in the morning followed by a bowel movement with "large" amount of bright red blood.  She states she also had some incontinence on the way here.  Her bowel movements are normally loose because she has been taking stool softeners for multiple years now.  She states she feels dehydrated tried to drink some water today.  She states she had some leg cramping yesterday as well.  She denies fever, nausea, vomiting, or sick contacts.  No respiratory complaints, however patient noted to be slightly hypoxic at 85% on room air upon arrival.  She is a longtime smoker. Patient also has second complaint of a yeast infection in her groin which is being treated by her PCP with a topical cream as well as oral tablets. She states it is very itchy and only improved with vagisil cream. She has an appointment with her gynecologist next week for further management.  Per chart review, patient had recent colonoscopy in March 2020 by Dr. Oletta Lamas.  She had a colonic polyp removed without complications.  He was also noted she had diverticulosis, and a small internal hemorrhoid.     The history is provided by the patient and medical records.    Past Medical History:  Diagnosis Date   Anxiety    Arthritis    arthritis -hips   Colitis    Complication of anesthesia    B/P dropped post colonoscopy x1   GERD (gastroesophageal reflux disease)    Headache(784.0)    migraines rare occ.   Hypertension    Urinary tract infection early aug 2014    Patient Active Problem List   Diagnosis Date Noted   Hypoxia 04/24/2019   Colitis 01/04/2018   Rectal bleeding 01/04/2018   GERD (gastroesophageal reflux disease) 01/04/2018    Past Surgical History:  Procedure Laterality Date   CATARACT EXTRACTION, BILATERAL Bilateral    COLONOSCOPY WITH PROPOFOL N/A 08/03/2013   Procedure: COLONOSCOPY WITH PROPOFOL;  Surgeon: Winfield Cunas., MD;  Location: [facility];  Service: Endoscopy;  Laterality: N/A;  ultra thin colon scope   COLONOSCOPY WITH PROPOFOL N/A 02/04/2018   Procedure: COLONOSCOPY WITH PROPOFOL;  Surgeon: Laurence Spates, MD;  Location: [facility] ENDOSCOPY;  Service: Endoscopy;  Laterality: N/A;   LASIK       OB History   No obstetric history on file.      Home Medications    Prior to Admission medications   Medication Sig Start Date End Date Taking? Authorizing Provider  acetaminophen (TYLENOL) 650 MG CR tablet Take 1,300 mg by mouth every 8 (eight) hours as needed for pain.    [provider]  ALPRAZolam Duanne Moron) 0.25 MG tablet Take 0.25 mg by mouth 2 (two) times daily as needed for anxiety.    [provider]  amLODipine (NORVASC) 5 MG tablet Take 5 mg by mouth daily. 11/14/17   [provider]  benazepril (LOTENSIN) 20 MG tablet Take 20 mg by mouth daily. 11/27/17   [provider]  Calcium Carbonate-Vit D-Min (CALTRATE 600+D PLUS PO) Take 1 tablet by mouth  daily.    [provider]  escitalopram (LEXAPRO) 10 MG tablet Take 10 mg by mouth daily.    [provider]  fluticasone (FLONASE) 50 MCG/ACT nasal spray Place 1 spray into both nostrils daily as needed for allergies or rhinitis.    [provider]  MAGNESIUM CARBONATE PO Take 1 tablet by mouth daily.    [provider]  Multiple Vitamin (MULTIVITAMIN WITH MINERALS) TABS tablet Take 1 tablet by mouth daily.    [provider]  ondansetron (ZOFRAN ODT) 4 MG disintegrating tablet Take 1 tablet (4 mg total) by mouth every 8  (eight) hours as needed for nausea or vomiting. Patient not taking: Reported on 01/23/2018 01/06/18   [name], [name], MD  pantoprazole (PROTONIX) 40 MG tablet Take 40 mg by mouth daily. 10/28/17   [provider]  polycarbophil (FIBERCON) 625 MG tablet Take 625 mg by mouth daily.    [provider]  Probiotic Product (ALIGN PO) Take 1 tablet by mouth daily.    [provider]  Pseudoephedrine HCl (SUDAFED 12 HOUR PO) Take 1 tablet by mouth 2 (two) times daily as needed (for congestion).    [provider]  raloxifene (EVISTA) 60 MG tablet Take 60 mg by mouth daily. 12/30/17   [provider]  simvastatin (ZOCOR) 20 MG tablet Take 20 mg by mouth daily.    [provider]    Family History No family history on file.  Social History Social History   Tobacco Use   Smoking status: Current Every Day Smoker    Packs/day: 1.00    Years: 40.00    Pack years: 40.00    Types: Cigarettes   Smokeless tobacco: Never Used  Substance Use Topics   Alcohol use: No   Drug use: No     Allergies   Ciprofloxacin; Nitrofuran derivatives; and Sulfa antibiotics   Review of Systems Review of Systems  Constitutional: Negative for fever.  Respiratory: Negative for cough and shortness of breath.   Gastrointestinal: Positive for abdominal pain, blood in stool and diarrhea. Negative for nausea, rectal pain and vomiting.  Skin: Positive for rash.  All other systems reviewed and are negative.    Physical Exam Updated Vital Signs BP 94/79 (BP Location: Right Arm)    Pulse 92    Temp 98.9 F (37.2 C) (Oral)    Resp (!) 22    Ht 5\' 1"  (1.549 m)    Wt 47.6 kg    SpO2 (!) 89%    BMI 19.84 kg/m   Physical Exam Vitals signs and nursing note reviewed.  Constitutional:      General: She is not in acute distress.    Appearance: She is well-developed.  HENT:     Head: Normocephalic and atraumatic.  Eyes:     Conjunctiva/sclera: Conjunctivae normal.    Cardiovascular:     Rate and Rhythm: Normal rate and regular rhythm.  Pulmonary:     Effort: Pulmonary effort is normal. No respiratory distress.     Comments: Lung sounds diminished bilaterally. Abdominal:     General: Abdomen is flat. Bowel sounds are normal. There is no distension.     Palpations: Abdomen is soft. There is no mass.     Tenderness: There is no abdominal tenderness. There is no guarding or rebound.  Genitourinary:    Comments: Rectal exam performed with female RN chaperone present.  There is a nonthrombosed external hemorrhoid that is nontender and not bleeding.  There  is a small internal hemorrhoid that is palpable with some tenderness.  Stool appears brown with a reddish tint. There is erythematous maculopapular rash to the lower abdomen, inguinal region, and external labia.  There is no obvious bloody vaginal discharge.  Rash is nontender, no pustules, bulla, or desquamation. Musculoskeletal:     Right lower leg: No edema.     Left lower leg: No edema.  Skin:    General: Skin is warm.  Neurological:     Mental Status: She is alert.  Psychiatric:        Behavior: Behavior normal.      ED Treatments / Results  Labs (all labs ordered are listed, but only abnormal results are displayed) Labs Reviewed  COMPREHENSIVE METABOLIC PANEL - Abnormal; Notable for the following components:      Result Value   Sodium 134 (*)    Glucose, Bld 103 (*)    Calcium 8.8 (*)    Alkaline Phosphatase 37 (*)    All other components within normal limits  CBC WITH DIFFERENTIAL/PLATELET - Abnormal; Notable for the following components:   WBC 11.3 (*)    MCV 104.4 (*)    MCH 34.1 (*)    Neutro Abs 8.9 (*)    All other components within normal limits  URINALYSIS, ROUTINE W REFLEX MICROSCOPIC - Abnormal; Notable for the following components:   Nitrite POSITIVE (*)    Leukocytes,Ua TRACE (*)    All other components within normal limits  URINALYSIS, MICROSCOPIC (REFLEX) - Abnormal;  Notable for the following components:   Bacteria, UA FEW (*)    All other components within normal limits  SARS CORONAVIRUS 2 (HOSP ORDER, PERFORMED IN [facility] VIA [facility]OTT ID)  URINE CULTURE  OCCULT BLOOD X 1 CARD TO LAB, STOOL    EKG None  Radiology Ct Angio Chest Pe W/cm &/or Wo Cm  Result Date: 04/24/2019 CLINICAL DATA:  Hypoxia. EXAM: CT ANGIOGRAPHY CHEST WITH CONTRAST TECHNIQUE: Multidetector CT imaging of the chest was performed using the standard protocol during bolus administration of intravenous contrast. Multiplanar CT image reconstructions and MIPs were obtained to evaluate the vascular anatomy. CONTRAST:  14mL OMNIPAQUE IOHEXOL 350 MG/ML SOLN COMPARISON:  Radiograph of same day. FINDINGS: Cardiovascular: Satisfactory opacification of the pulmonary arteries to the segmental level. No evidence of pulmonary embolism. Normal heart size. No pericardial effusion. Atherosclerosis of thoracic aorta is noted without aneurysm or dissection. Mediastinum/Nodes: No enlarged mediastinal, hilar, or axillary lymph nodes. Thyroid gland, trachea, and esophagus demonstrate no significant findings. Lungs/Pleura: No pneumothorax or pleural effusion is noted. Minimal right posterior basilar subsegmental atelectasis is noted. Focal opacity is noted posteriorly in the right upper lobe which may represent focal atelectasis or inflammation. Upper Abdomen: No acute abnormality. Musculoskeletal: No chest wall abnormality. No acute or significant osseous findings. Review of the MIP images confirms the above findings. IMPRESSION: No definite evidence of pulmonary embolus. Small focal opacity is noted posteriorly in the right upper lobe which may represent focal atelectasis or inflammation. Aortic Atherosclerosis (ICD10-I70.0). Electronically Signed   By: Marijo Conception M.D.   On: 04/24/2019 13:20   Dg Chest Port 1 View  Result Date: 04/24/2019 CLINICAL DATA:  Hypoxia, bleeding EXAM: PORTABLE CHEST 1 VIEW  COMPARISON:  01/05/2018 FINDINGS: The lungs are hyperinflated likely secondary to COPD. There is no focal parenchymal opacity. There is no pleural effusion or pneumothorax. The heart and mediastinal contours are unremarkable. The osseous structures are unremarkable. IMPRESSION: No active disease. Electronically Signed  By: Kathreen Devoid   On: 04/24/2019 12:17    Procedures Procedures (including critical care time)  Medications Ordered in ED Medications  nicotine (NICODERM CQ - dosed in mg/24 hours) patch 21 mg (21 mg Transdermal Patch Applied 04/24/19 1545)  nystatin (MYCOSTATIN/NYSTOP) topical powder (has no administration in time range)  sodium chloride 0.9 % bolus 500 mL ( Intravenous Stopped 04/24/19 1227)  albuterol (VENTOLIN HFA) 108 (90 Base) MCG/ACT inhaler 2 puff (2 puffs Inhalation Given 04/24/19 1220)  iohexol (OMNIPAQUE) 350 MG/ML injection 100 mL (64 mLs Intravenous Contrast Given 04/24/19 1250)     Initial Impression / Assessment and Plan / ED Course  I have reviewed the triage vital signs and the nursing notes.  Pertinent labs & imaging results that were available during my care of the patient were reviewed by me and considered in my medical decision making (see chart for details).        Patient presenting with concern for possible blood in her stool, however he found to be hypoxic upon arrival at 85% on room air, requiring 2 L nasal cannula.  She is a chronic smoker with questionable chronic lung disease.  Unable to see PCP notes.  Abdomen is benign on exam.  Rectal exam with small nonthrombosed external hemorrhoid that is not tender or nonbleeding.  Hemoccult is negative.  Labs without anemia.  Urine with nitrites, trace leuks and few bacteria, culture sent.  No blood seen.  Patient does have dysuria, however suspect this is likely secondary to the candidal infection in her groin she has been treated outpatient by her PCP.  She endorses itching.  Patient has outpatient GYN  appointment scheduled next week for further management of this.  Patient remained hypoxic in the ED with good waveform.  Maintaining O2 saturation of 91 to 92% on 2 L, however without O2 supplementation, patient drops to high 80s at rest and down to the 70s with ambulation.  She is not short of breath.  No infectious symptoms.  Patient discussed with Dr. Reather Converse.  Proceeded with CTA of the chest to rule out possible PE, however this is negative.  Suspect this is likely chronic in nature.  Most recent O2 saturation documented was March 2019 at 92%.  At this time, will admit for hypoxia.  Dr. Sloan Leiter accepting admission at [facility].  Patient discussed with and evaluated by Dr. Reather Converse, who guided work-up and agrees with care plan for admission.  Final Clinical Impressions(s) / ED Diagnoses   Final diagnoses:  Hypoxia    ED Discharge Orders    None       Keigo Whalley, [place] N, PA-C 04/24/19 1602    Elnora Morrison, MD 04/27/19 [phone]

## 2019-04-24 NOTE — ED Notes (Signed)
Patient denies pain and is resting comfortably.  

## 2019-04-24 NOTE — ED Notes (Signed)
Spoke Pt. Daughter about no visitor policy  And Pt condition with her O2 sats and need for oxygen.  Pt. Daughter Kennyth Lose understands

## 2019-04-24 NOTE — ED Notes (Signed)
ED Provider at bedside. 

## 2019-04-25 DIAGNOSIS — Z720 Tobacco use: Secondary | ICD-10-CM

## 2019-04-25 DIAGNOSIS — N3090 Cystitis, unspecified without hematuria: Secondary | ICD-10-CM

## 2019-04-25 DIAGNOSIS — I10 Essential (primary) hypertension: Secondary | ICD-10-CM

## 2019-04-25 DIAGNOSIS — B356 Tinea cruris: Secondary | ICD-10-CM

## 2019-04-25 DIAGNOSIS — R0902 Hypoxemia: Principal | ICD-10-CM

## 2019-04-25 DIAGNOSIS — K922 Gastrointestinal hemorrhage, unspecified: Secondary | ICD-10-CM

## 2019-04-25 DIAGNOSIS — F419 Anxiety disorder, unspecified: Secondary | ICD-10-CM

## 2019-04-25 LAB — URINE CULTURE: Culture: 10000 — AB

## 2019-04-25 LAB — CBC
HCT: 43 % (ref 36.0–46.0)
Hemoglobin: 13.6 g/dL (ref 12.0–15.0)
MCH: 33.9 pg (ref 26.0–34.0)
MCHC: 31.6 g/dL (ref 30.0–36.0)
MCV: 107.2 fL — ABNORMAL HIGH (ref 80.0–100.0)
Platelets: 258 10*3/uL (ref 150–400)
RBC: 4.01 MIL/uL (ref 3.87–5.11)
RDW: 13.8 % (ref 11.5–15.5)
WBC: 8.4 10*3/uL (ref 4.0–10.5)
nRBC: 0 % (ref 0.0–0.2)

## 2019-04-25 LAB — HEMOGLOBIN AND HEMATOCRIT, BLOOD
HCT: 42.6 % (ref 36.0–46.0)
Hemoglobin: 13.8 g/dL (ref 12.0–15.0)

## 2019-04-25 LAB — OCCULT BLOOD X 1 CARD TO LAB, STOOL: Fecal Occult Bld: POSITIVE — AB

## 2019-04-25 MED ORDER — CYANOCOBALAMIN 1000 MCG/ML IJ SOLN
1000.0000 ug | Freq: Every day | INTRAMUSCULAR | Status: DC
  Administered 2019-04-25 – 2019-04-27 (×3): 1000 ug via SUBCUTANEOUS
  Filled 2019-04-25 (×5): qty 1

## 2019-04-25 MED ORDER — DIPHENHYDRAMINE HCL 25 MG PO CAPS
25.0000 mg | ORAL_CAPSULE | Freq: Four times a day (QID) | ORAL | Status: DC | PRN
  Administered 2019-04-25 – 2019-04-26 (×2): 25 mg via ORAL
  Filled 2019-04-25 (×2): qty 1

## 2019-04-25 MED ORDER — SODIUM CHLORIDE 0.9 % IV SOLN
INTRAVENOUS | Status: DC
  Administered 2019-04-25 – 2019-04-26 (×2): via INTRAVENOUS

## 2019-04-25 MED ORDER — HYDROXYZINE HCL 10 MG PO TABS
10.0000 mg | ORAL_TABLET | Freq: Three times a day (TID) | ORAL | Status: DC | PRN
  Filled 2019-04-25: qty 1

## 2019-04-25 MED ORDER — UMECLIDINIUM BROMIDE 62.5 MCG/INH IN AEPB
1.0000 | INHALATION_SPRAY | Freq: Every day | RESPIRATORY_TRACT | Status: DC
  Administered 2019-04-26 – 2019-04-27 (×2): 1 via RESPIRATORY_TRACT
  Filled 2019-04-25: qty 7

## 2019-04-25 MED ORDER — TIOTROPIUM BROMIDE MONOHYDRATE 18 MCG IN CAPS: 18.0000 ug | ORAL_CAPSULE | Freq: Every day | RESPIRATORY_TRACT | Status: DC

## 2019-04-25 MED ORDER — MOMETASONE FURO-FORMOTEROL FUM 100-5 MCG/ACT IN AERO
2.0000 | INHALATION_SPRAY | Freq: Two times a day (BID) | RESPIRATORY_TRACT | Status: DC
  Administered 2019-04-25 – 2019-04-27 (×4): 2 via RESPIRATORY_TRACT
  Filled 2019-04-25: qty 8.8

## 2019-04-25 NOTE — Progress Notes (Signed)
PROGRESS NOTE    Carol A Mathwig  QJJ:[id number] DOB: 1936-05-26 DOA: 04/24/2019 PCP: Shirline Frees, MD   Brief Narrative:  HPI per Dr.Wouk Carol [name] is a 83 y.o. female with medical history significant for htn, 53 pack-year smoking history, osteoporosis, and anxiety, who presents with above.  Had an episode of rectal bleeding a little over a year ago. At that time had pain and diarrhea. Treated for infectious colitis. Thought possibly ischemic. F/u colonoscopy a month later saw diverticulosis, internal hemorrhoids, and one small polyp. Otherwise unremarkable.   Denies prior history of rectal bleeding.  States was in usual state of health when last night felt urge to defecate. Had a large volume bowel movement that toward the end became soft and watery. Did not notice blood or mucous. Has had occasional constipation lately and was recently started on miralax; says recent bowel movements have been relatively regular.  This morning felt the urge to pass flatus and when she did it was in fact blood, a little more than the amount of a menstrual period. That happened once more today. She presented to the [facility] ED to evaluate this.  Denies abdominal pain. Denies fever. Denies shortness of breath. Has chronic occasional cough but no change from that baseline. Denies significant or worsening dyspnea on exertion or at rest.   Patient also notes increased urinary frequency last few days. No blood in urine. No fevers or flank pain.  Patient also reports several weeks itchy burning rash in groin. PCP has been treating telephonically, first with creams, then with a pill she took once and then again in a week. That pill helped some but how has returned. Very itchy.    ED Course: hypoxic to 80 when ambulating. Labs, CTA.  Assessment & Plan:   Principal Problem:   Hematochezia Active Problems:   Hypoxia   Tobacco abuse   Anxiety   Osteoporosis   Essential hypertension  Cystitis   Tinea cruris   Gastrointestinal hemorrhage   1 lower GI bleed/hematochezia Questionable etiology.  Differential diagnosis includes hemorrhoids versus ischemic colitis as patient noted to have some abdominal cramping per patient prior to bloody bowel movement prior to admission.  Patient also noted to have history of constipation and large bowel movement.  Patient stated had a bloody bowel movement earlier on this morning.  Hemoglobin seems stable.  Repeat H&H this afternoon.  Patient looks clinically dehydrated on examination and such we will place on IV fluids with gentle hydration.  Currently on clear liquids.  Placed on a regular diet and follow.  If continued ongoing lower GI bleed will place back on a clear liquid diet and consult with GI for further evaluation and management.  Follow.  2.  Hypoxia Asymptomatic.  Chest x-ray with hyperinflation with findings consistent with COPD.  Patient with a greater than 50-pack-year history of tobacco use and ongoing tobacco use.  No formal diagnosis of COPD.  Patient speaking in full sentences.  Patient noted to have sats of 87% on room air.  Per admitting physician at [facility] patient noted to desat to the 80s on ambulation.  CT angiogram chest which was done was negative for PE, infiltrate.  SARS-CoV-2 negative.  Will check an ambulatory sats.  Placed on Dulera and Spiriva.  Will likely need outpatient spirometry/follow-up.  May need home O2 on discharge.  Follow for now.  3.  Tinea cruris Patient noted to have failed outpatient creams and possibly oral fluconazole.  CRP within  normal limits.  Patient started on terbinafine which we will continue for now.  Outpatient follow-up with PCP.  4.  Dehydration Placed on IV fluids for gentle hydration.  5.  Acute cystitis Urinalysis with positive nitrites, trace leukocytes, few bacteria.  Urine cultures pending.  Patient symptomatic with dysuria.  Continue Omnicef.  6.  Low vitamin  B12 levels Vitamin B12 1000 MCG's subcutaneously daily x7 days, and then weekly x1 month, and then monthly.  Outpatient follow-up with PCP.  7.  Gastroesophageal reflux disease PPI.  8.  Hypertension Blood pressure somewhat borderline.  Patient already received home regimen of Norvasc and Lotensin.  Will discontinue antihypertensive medications for now and follow.  9.  Tobacco abuse Continue nicotine patch.  Tobacco cessation stressed to patient.  10.  Anxiety disorder Stable.  Continue Lexapro and alprazolam as needed.   DVT prophylaxis: SCDs Code Status: Full Family Communication: Updated patient.  No family at bedside. Disposition Plan: Likely back home when clinically stable with resolution of rectal bleeding and improvement with dehydration.   Consultants:   None  Procedures:   CT angiogram chest 04/24/2019  Chest x-ray 04/24/2019  Antimicrobials:   None   Subjective: Patient states had another bloody bowel movement earlier on this morning.  Denies any chest pain or shortness of breath.  States has a greater than 50-pack-year of tobacco use with ongoing use.  Tolerating clears.  Patient patient with complaints of dysuria and itching in the groin region.  Objective: Vitals:   04/24/19 1751 04/24/19 2036 04/25/19 0547 04/25/19 1316  BP: 133/65 126/68 110/60 (!) 126/56  Pulse: 88 87 88 92  Resp: 14 20 16  (!) 22  Temp: 98.7 F (37.1 C) 98.2 F (36.8 C) 98.7 F (37.1 C) 97.7 F (36.5 C)  TempSrc: Oral Oral Oral Oral  SpO2: 100% 93% 95% 96%  Weight:      Height:        Intake/Output Summary (Last 24 hours) at 04/25/2019 1703 Last data filed at 04/24/2019 1704 Gross per 24 hour  Intake -  Output 700 ml  Net -700 ml   Filed Weights   04/24/19 1025 04/24/19 1750  Weight: 47.6 kg 47.7 kg    Examination:  General exam: Appears calm and comfortable.  Extremely dry mucous membranes. Respiratory system: Clear to auscultation.  Poor air movement.  No wheezing  noted.  No crackles noted.  No rhonchi noted.  Respiratory effort normal. Cardiovascular system: S1 & S2 heard, RRR. No JVD, murmurs, rubs, gallops or clicks. No pedal edema. Gastrointestinal system: Abdomen is nondistended, soft and nontender. No organomegaly or masses felt. Normal bowel sounds heard. Central nervous system: Alert and oriented. No focal neurological deficits. Extremities: Symmetric 5 x 5 power. Skin: No rashes, lesions or ulcers Psychiatry: Judgement and insight appear normal. Mood & affect appropriate.     Data Reviewed: I have personally reviewed following labs and imaging studies  CBC: Recent Labs  Lab 04/24/19 1100 04/25/19 0553 04/25/19 1521  WBC 11.3* 8.4  --   NEUTROABS 8.9*  --   --   HGB 14.0 13.6 13.8  HCT 42.8 43.0 42.6  MCV 104.4* 107.2*  --   PLT 264 258  --    Basic Metabolic Panel: Recent Labs  Lab 04/24/19 1100  NA 134*  K 4.2  CL 98  CO2 27  GLUCOSE 103*  BUN 10  CREATININE 0.52  CALCIUM 8.8*   GFR: Estimated Creatinine Clearance: 42.2 mL/min (by C-G formula based on SCr  of 0.52 mg/dL). Liver Function Tests: Recent Labs  Lab 04/24/19 1100  AST 15  ALT 10  ALKPHOS 37*  BILITOT 0.5  PROT 6.6  ALBUMIN 3.7   No results for input(s): LIPASE, AMYLASE in the last 168 hours. No results for input(s): AMMONIA in the last 168 hours. Coagulation Profile: No results for input(s): INR, PROTIME in the last 168 hours. Cardiac Enzymes: No results for input(s): CKTOTAL, CKMB, CKMBINDEX, TROPONINI in the last 168 hours. BNP (last 3 results) No results for input(s): PROBNP in the last 8760 hours. HbA1C: No results for input(s): HGBA1C in the last 72 hours. CBG: No results for input(s): GLUCAP in the last 168 hours. Lipid Profile: No results for input(s): CHOL, HDL, LDLCALC, TRIG, CHOLHDL, LDLDIRECT in the last 72 hours. Thyroid Function Tests: No results for input(s): TSH, T4TOTAL, FREET4, T3FREE, THYROIDAB in the last 72 hours. Anemia  Panel: Recent Labs    04/24/19 2052  VITAMINB12 240   Sepsis Labs: No results for input(s): PROCALCITON, LATICACIDVEN in the last 168 hours.  Recent Results (from the past 240 hour(s))  SARS Coronavirus 2 (Hosp order,Performed in [facility] lab via [employer])     Status: None   Collection Time: 04/24/19 11:56 AM  Result Value Ref Range Status   SARS Coronavirus 2 ([employer] Now) NEGATIVE NEGATIVE Final    Comment: (NOTE) Interpretive Result Comment(s): COVID 19 Positive SARS CoV 2 target nucleic acids are DETECTED. The SARS CoV 2 RNA is generally detectable in upper and lower respiratory specimens during the acute phase of infection.  Positive results are indicative of active infection with SARS CoV 2.  Clinical correlation with patient history and other diagnostic information is necessary to determine patient infection status.  Positive results do not rule out bacterial infection or coinfection with other viruses. The expected result is Negative. COVID 19 Negative SARS CoV 2 target nucleic acids are NOT DETECTED. The SARS CoV 2 RNA is generally detectable in upper and lower respiratory specimens during the acute phase of infection.  Negative results do not preclude SARS CoV 2 infection, do not rule out coinfections with other pathogens, and should not be used as the sole basis for treatment or other patient management decisions.  Negative results must be combined with clinical  observations, patient history, and epidemiological information. The expected result is Negative. Invalid Presence or absence of SARS CoV 2 nucleic acids cannot be determined. Repeat testing was performed on the submitted specimen and repeated Invalid results were obtained.  If clinically indicated, additional testing on a new specimen with an alternate test methodology [phone]) is advised.  The SARS CoV 2 RNA is generally detectable in upper and lower respiratory specimens during the acute phase  of infection. The expected result is Negative. Fact Sheet for Patients:  GolfingFamily.no Fact Sheet for Healthcare Providers: https://www.hernandez-brewer.com/ This test is not yet approved or cleared by the [place] FDA and has been authorized for detection and/or diagnosis of SARS CoV 2 by FDA under an Emergency Use Authorization (EUA).  This EUA will remain in effect (meaning this test can be used) for the duration of the COVID19 d eclaration under Section 564(b)(1) of the Act, 21 U.S.C. section 787 153 7053 3(b)(1), unless the authorization is terminated or revoked sooner. Performed at [facility], [address] [place], [place] [address]   Urine culture     Status: Abnormal   Collection Time: 04/24/19 11:58 AM  Result Value Ref Range Status   Specimen  Description   Final    URINE, RANDOM Performed at [facility], [address] [place], [place] [address]    Special Requests   Final    NONE Performed at [facility], [address] [place], [place] [address]    Culture (A)  Final    10,000 COLONIES/mL GROUP B STREP(S.AGALACTIAE)ISOLATED TESTING AGAINST S. AGALACTIAE NOT ROUTINELY PERFORMED DUE TO PREDICTABILITY OF AMP/PEN/VAN SUSCEPTIBILITY. Performed at [facility], [place] [address] [place], [place] [address]    Report Status 04/25/2019 FINAL  Final         Radiology Studies: Ct Angio Chest Pe W/cm &/or Wo Cm  Result Date: 04/24/2019 CLINICAL DATA:  Hypoxia. EXAM: CT ANGIOGRAPHY CHEST WITH CONTRAST TECHNIQUE: Multidetector CT imaging of the chest was performed using the standard protocol during bolus administration of intravenous contrast. Multiplanar CT image reconstructions and MIPs were obtained to evaluate the vascular anatomy. CONTRAST:  48mL OMNIPAQUE IOHEXOL 350 MG/ML SOLN COMPARISON:  Radiograph of same day. FINDINGS: Cardiovascular: Satisfactory opacification of the pulmonary  arteries to the segmental level. No evidence of pulmonary embolism. Normal heart size. No pericardial effusion. Atherosclerosis of thoracic aorta is noted without aneurysm or dissection. Mediastinum/Nodes: No enlarged mediastinal, hilar, or axillary lymph nodes. Thyroid gland, trachea, and esophagus demonstrate no significant findings. Lungs/Pleura: No pneumothorax or pleural effusion is noted. Minimal right posterior basilar subsegmental atelectasis is noted. Focal opacity is noted posteriorly in the right upper lobe which may represent focal atelectasis or inflammation. Upper Abdomen: No acute abnormality. Musculoskeletal: No chest wall abnormality. No acute or significant osseous findings. Review of the MIP images confirms the above findings. IMPRESSION: No definite evidence of pulmonary embolus. Small focal opacity is noted posteriorly in the right upper lobe which may represent focal atelectasis or inflammation. Aortic Atherosclerosis (ICD10-I70.0). Electronically Signed   By: Marijo Conception M.D.   On: 04/24/2019 13:20   Dg Chest Port 1 View  Result Date: 04/24/2019 CLINICAL DATA:  Hypoxia, bleeding EXAM: PORTABLE CHEST 1 VIEW COMPARISON:  01/05/2018 FINDINGS: The lungs are hyperinflated likely secondary to COPD. There is no focal parenchymal opacity. There is no pleural effusion or pneumothorax. The heart and mediastinal contours are unremarkable. The osseous structures are unremarkable. IMPRESSION: No active disease. Electronically Signed   By: Kathreen Devoid   On: 04/24/2019 12:17        Scheduled Meds: . amLODipine  5 mg Oral Daily  . benazepril  20 mg Oral Daily  . cefdinir  300 mg Oral Q12H  . cyanocobalamin  1,000 mcg Subcutaneous Daily  . escitalopram  10 mg Oral QHS  . mometasone-formoterol  2 puff Inhalation BID  . nicotine  21 mg Transdermal Q24H  . pantoprazole  40 mg Oral Daily  . polycarbophil  625 mg Oral Daily  . raloxifene  60 mg Oral Daily  . simvastatin  20 mg Oral QHS   . sodium chloride flush  3 mL Intravenous Q12H  . terbinafine  250 mg Oral Daily  . umeclidinium bromide  1 puff Inhalation Daily   Continuous Infusions: . sodium chloride    . sodium chloride 75 mL/hr at 04/25/19 1258     LOS: 1 day    Time spent: 35 minutes    Irine Seal, MD [facility]  If 7PM-7AM, please contact night-coverage [email or web address] 04/25/2019, 5:03 PM

## 2019-04-25 NOTE — Progress Notes (Signed)
This RN ambulated with patient in hallway.  Patient oxygen on RA dropped to 78%.  Placed patient on oxygen, starting with 1L. At 3L, patient sat only rose to 88%.  RN raised oxygen to 4L. Patient maintained at 90% on 4L.  Patient expressed that she had no distress.  RN encouraged patient to practice deep breathing. Upon return to patient's room, RN assisted patient to bedside chair.

## 2019-04-25 NOTE — Plan of Care (Signed)
  Problem: Education: Goal: Knowledge of General Education information will improve Description Including pain rating scale, medication(s)/side effects and non-pharmacologic comfort measures Outcome: Progressing   Problem: Health Behavior/Discharge Planning: Goal: Ability to manage health-related needs will improve Outcome: Progressing   Problem: Clinical Measurements: Goal: Ability to maintain clinical measurements within normal limits will improve Outcome: Progressing Goal: Will remain free from infection Outcome: Progressing Goal: Diagnostic test results will improve Outcome: Progressing Goal: Respiratory complications will improve Outcome: Not Progressing Goal: Cardiovascular complication will be avoided Outcome: Progressing   Problem: Activity: Goal: Risk for activity intolerance will decrease Outcome: Progressing   Problem: Nutrition: Goal: Adequate nutrition will be maintained Outcome: Progressing   Problem: Coping: Goal: Level of anxiety will decrease Outcome: Progressing   Problem: Elimination: Goal: Will not experience complications related to bowel motility Outcome: Progressing Goal: Will not experience complications related to urinary retention Outcome: Progressing   Problem: Elimination: Goal: Will not experience complications related to bowel motility Outcome: Progressing Goal: Will not experience complications related to urinary retention Outcome: Progressing   Problem: Pain Managment: Goal: General experience of comfort will improve Outcome: Progressing   Problem: Safety: Goal: Ability to remain free from injury will improve Outcome: Progressing   Problem: Skin Integrity: Goal: Risk for impaired skin integrity will decrease Outcome: Progressing

## 2019-04-26 LAB — CBC
HCT: 40.1 % (ref 36.0–46.0)
Hemoglobin: 12.8 g/dL (ref 12.0–15.0)
MCH: 34.5 pg — ABNORMAL HIGH (ref 26.0–34.0)
MCHC: 31.9 g/dL (ref 30.0–36.0)
MCV: 108.1 fL — ABNORMAL HIGH (ref 80.0–100.0)
Platelets: 239 10*3/uL (ref 150–400)
RBC: 3.71 MIL/uL — ABNORMAL LOW (ref 3.87–5.11)
RDW: 13.5 % (ref 11.5–15.5)
WBC: 7.5 10*3/uL (ref 4.0–10.5)
nRBC: 0 % (ref 0.0–0.2)

## 2019-04-26 LAB — BASIC METABOLIC PANEL
Anion gap: 8 (ref 5–15)
BUN: 9 mg/dL (ref 8–23)
CO2: 25 mmol/L (ref 22–32)
Calcium: 8.3 mg/dL — ABNORMAL LOW (ref 8.9–10.3)
Chloride: 107 mmol/L (ref 98–111)
Creatinine, Ser: 0.49 mg/dL (ref 0.44–1.00)
GFR calc Af Amer: >60 mL/min (ref >60)
GFR calc non Af Amer: >60 mL/min (ref >60)
Glucose, Bld: 65 mg/dL — ABNORMAL LOW (ref 70–99)
Potassium: 3.5 mmol/L (ref 3.5–5.1)
Sodium: 140 mmol/L (ref 135–145)

## 2019-04-26 LAB — HEMOGLOBIN AND HEMATOCRIT, BLOOD
HCT: 42.8 % (ref 36.0–46.0)
Hemoglobin: 13.4 g/dL (ref 12.0–15.0)

## 2019-04-26 MED ORDER — POTASSIUM CHLORIDE CRYS ER 20 MEQ PO TBCR
40.0000 meq | EXTENDED_RELEASE_TABLET | Freq: Once | ORAL | Status: AC
  Administered 2019-04-26: 40 meq via ORAL
  Filled 2019-04-26: qty 2

## 2019-04-26 MED ORDER — SODIUM CHLORIDE 0.9 % IV SOLN: INTRAVENOUS | Status: DC

## 2019-04-26 MED ORDER — SODIUM CHLORIDE 0.9 % IV SOLN
INTRAVENOUS | Status: DC
  Administered 2019-04-26 (×2): via INTRAVENOUS

## 2019-04-26 NOTE — Progress Notes (Signed)
PROGRESS NOTE    Carol A Tolle  TMH:[id number] DOB: 1936/07/07 DOA: 04/24/2019 PCP: Shirline Frees, MD   Brief Narrative:  HPI per Dr.Wouk Carol [name] is a 83 y.o. female with medical history significant for htn, 53 pack-year smoking history, osteoporosis, and anxiety, who presents with above.  Had an episode of rectal bleeding a little over a year ago. At that time had pain and diarrhea. Treated for infectious colitis. Thought possibly ischemic. F/u colonoscopy a month later saw diverticulosis, internal hemorrhoids, and one small polyp. Otherwise unremarkable.   Denies prior history of rectal bleeding.  States was in usual state of health when last night felt urge to defecate. Had a large volume bowel movement that toward the end became soft and watery. Did not notice blood or mucous. Has had occasional constipation lately and was recently started on miralax; says recent bowel movements have been relatively regular.  This morning felt the urge to pass flatus and when she did it was in fact blood, a little more than the amount of a menstrual period. That happened once more today. She presented to the [facility] ED to evaluate this.  Denies abdominal pain. Denies fever. Denies shortness of breath. Has chronic occasional cough but no change from that baseline. Denies significant or worsening dyspnea on exertion or at rest.   Patient also notes increased urinary frequency last few days. No blood in urine. No fevers or flank pain.  Patient also reports several weeks itchy burning rash in groin. PCP has been treating telephonically, first with creams, then with a pill she took once and then again in a week. That pill helped some but how has returned. Very itchy.    ED Course: hypoxic to 80 when ambulating. Labs, CTA.  Assessment & Plan:   Principal Problem:   Hematochezia Active Problems:   Hypoxia   Tobacco abuse   Anxiety   Osteoporosis   Essential hypertension  Cystitis   Tinea cruris   Gastrointestinal hemorrhage   1 lower GI bleed/hematochezia Questionable etiology.  Differential diagnosis includes hemorrhoids versus ischemic colitis as patient noted to have some abdominal cramping per patient prior to bloody bowel movement prior to admission.  Patient also noted to have history of constipation and large bowel movement.  Patient stated had a bloody bowel movement the morning of 04/25/2019.  No further bowel movements per patient.  Hemoglobin at 12.8 today.  Repeat H&H this afternoon.  Continue IV fluids with gentle hydration.  Patient advanced to a regular diet which she is tolerating. If continued ongoing lower GI bleed will place back on a clear liquid diet and consult with GI for further evaluation and management.  Follow.  2.  Hypoxia Asymptomatic.  Likely secondary to probable chronic COPD.  Chest x-ray with hyperinflation with findings consistent with COPD.  Patient with a greater than 50-pack-year history of tobacco use and ongoing tobacco use.  No formal diagnosis of COPD.  Patient speaking in full sentences.  Patient noted to have sats of 87% on room air.  Per admitting physician at [facility] patient noted to desat to the 80s on ambulation.  CT angiogram chest which was done was negative for PE, infiltrate.  SARS-CoV-2 negative.  Ambulatory sats of 78% on room air, with sats going up to 90% on 4 L.  Continue Dulera and Incruse.  Will need home O2 on discharge. Will likely need outpatient spirometry/follow-up.  Follow for now.  3.  Tinea cruris Patient noted to have  failed outpatient creams and possibly oral fluconazole.  CRP within normal limits.  Patient started on terbinafine with some clinical improvement, which we will continue for now.  Benadryl as needed.  Hydroxyzine as needed.  Outpatient follow-up with PCP.  4.  Dehydration Continue gentle hydration for 1 more day.  5.  Acute cystitis Urinalysis with positive nitrites, trace  leukocytes, few bacteria.  Urine cultures with 10,000 colonies of group B strep.  Patient presented with dysuria and symptomatic and as such we will treat empirically for 3 days.  Continue Omnicef day #2/3.   6.  Low vitamin B12 levels Vitamin B12 1000 MCG's subcutaneously daily x7 days, and then weekly x1 month, and then monthly.  Outpatient follow-up with PCP.  7.  Gastroesophageal reflux disease PPI.  8.  Hypertension Blood pressure somewhat borderline.  Blood pressure improving with discontinuation of home regimen of antihypertensive medications.   9.  Tobacco abuse Continue nicotine patch.  Tobacco cessation stressed to patient.  10.  Anxiety disorder Stable.  Continue Lexapro and alprazolam as needed.    DVT prophylaxis: SCDs Code Status: Full Family Communication: Updated patient.  No family at bedside. Disposition Plan: Likely back home when clinically stable with resolution of rectal bleeding and improvement with dehydration.   Consultants:   None  Procedures:   CT angiogram chest 04/24/2019  Chest x-ray 04/24/2019  Antimicrobials:   Omnicef 04/24/2019   Subjective: Patient laying in bed.  No bowel movement today.  No chest pain.  No shortness of breath.  Tolerating current diet.  Patient states dysuria improving.  Itching in the groin area improving.   Objective: Vitals:   04/25/19 1316 04/25/19 1951 04/25/19 2044 04/26/19 0531  BP: (!) 126/56  112/68 120/60  Pulse: 92  83 83  Resp: (!) 22  20 20   Temp: 97.7 F (36.5 C)  98.7 F (37.1 C) 98.5 F (36.9 C)  TempSrc: Oral   Oral  SpO2: 96% 98% 95% 96%  Weight:      Height:        Intake/Output Summary (Last 24 hours) at 04/26/2019 1114 Last data filed at 04/26/2019 0600 Gross per 24 hour  Intake 1259.84 ml  Output -  Net 1259.84 ml   Filed Weights   04/24/19 1025 04/24/19 1750  Weight: 47.6 kg 47.7 kg    Examination:  General exam: Appears calm and comfortable.  Dry mucous membranes.    Respiratory system: Lungs clear to auscultation bilaterally.  Poor to fair air movement.  No wheezing, no crackles, no rhonchi.  Speaking in full sentences.  Normal respiratory effort.   Cardiovascular system: Regular rate rhythm no murmurs rubs or gallops.  No JVD.  No lower extremity edema.  Gastrointestinal system: Abdomen is soft, nontender, nondistended, positive bowel sounds.  No rebound.  No guarding.  Central nervous system: Alert and oriented. No focal neurological deficits. Extremities: Symmetric 5 x 5 power. Skin: No rashes, lesions or ulcers Psychiatry: Judgement and insight appear normal. Mood & affect appropriate.     Data Reviewed: I have personally reviewed following labs and imaging studies  CBC: Recent Labs  Lab 04/24/19 1100 04/25/19 0553 04/25/19 1521 04/26/19 0430  WBC 11.3* 8.4  --  7.5  NEUTROABS 8.9*  --   --   --   HGB 14.0 13.6 13.8 12.8  HCT 42.8 43.0 42.6 40.1  MCV 104.4* 107.2*  --  108.1*  PLT 264 258  --  128   Basic Metabolic Panel: Recent Labs  Lab 04/24/19 1100 04/26/19 0430  NA 134* 140  K 4.2 3.5  CL 98 107  CO2 27 25  GLUCOSE 103* 65*  BUN 10 9  CREATININE 0.52 0.49  CALCIUM 8.8* 8.3*   GFR: Estimated Creatinine Clearance: 42.2 mL/min (by C-G formula based on SCr of 0.49 mg/dL). Liver Function Tests: Recent Labs  Lab 04/24/19 1100  AST 15  ALT 10  ALKPHOS 37*  BILITOT 0.5  PROT 6.6  ALBUMIN 3.7   No results for input(s): LIPASE, AMYLASE in the last 168 hours. No results for input(s): AMMONIA in the last 168 hours. Coagulation Profile: No results for input(s): INR, PROTIME in the last 168 hours. Cardiac Enzymes: No results for input(s): CKTOTAL, CKMB, CKMBINDEX, TROPONINI in the last 168 hours. BNP (last 3 results) No results for input(s): PROBNP in the last 8760 hours. HbA1C: No results for input(s): HGBA1C in the last 72 hours. CBG: No results for input(s): GLUCAP in the last 168 hours. Lipid Profile: No results  for input(s): CHOL, HDL, LDLCALC, TRIG, CHOLHDL, LDLDIRECT in the last 72 hours. Thyroid Function Tests: No results for input(s): TSH, T4TOTAL, FREET4, T3FREE, THYROIDAB in the last 72 hours. Anemia Panel: Recent Labs    04/24/19 2052  VITAMINB12 240   Sepsis Labs: No results for input(s): PROCALCITON, LATICACIDVEN in the last 168 hours.  Recent Results (from the past 240 hour(s))  SARS Coronavirus 2 (Hosp order,Performed in [facility] lab via [employer])     Status: None   Collection Time: 04/24/19 11:56 AM  Result Value Ref Range Status   SARS Coronavirus 2 ([employer] Now) NEGATIVE NEGATIVE Final    Comment: (NOTE) Interpretive Result Comment(s): COVID 19 Positive SARS CoV 2 target nucleic acids are DETECTED. The SARS CoV 2 RNA is generally detectable in upper and lower respiratory specimens during the acute phase of infection.  Positive results are indicative of active infection with SARS CoV 2.  Clinical correlation with patient history and other diagnostic information is necessary to determine patient infection status.  Positive results do not rule out bacterial infection or coinfection with other viruses. The expected result is Negative. COVID 19 Negative SARS CoV 2 target nucleic acids are NOT DETECTED. The SARS CoV 2 RNA is generally detectable in upper and lower respiratory specimens during the acute phase of infection.  Negative results do not preclude SARS CoV 2 infection, do not rule out coinfections with other pathogens, and should not be used as the sole basis for treatment or other patient management decisions.  Negative results must be combined with clinical  observations, patient history, and epidemiological information. The expected result is Negative. Invalid Presence or absence of SARS CoV 2 nucleic acids cannot be determined. Repeat testing was performed on the submitted specimen and repeated Invalid results were obtained.  If clinically indicated,  additional testing on a new specimen with an alternate test methodology [phone]) is advised.  The SARS CoV 2 RNA is generally detectable in upper and lower respiratory specimens during the acute phase of infection. The expected result is Negative. Fact Sheet for Patients:  GolfingFamily.no Fact Sheet for Healthcare Providers: https://www.hernandez-brewer.com/ This test is not yet approved or cleared by the [place] FDA and has been authorized for detection and/or diagnosis of SARS CoV 2 by FDA under an Emergency Use Authorization (EUA).  This EUA will remain in effect (meaning this test can be used) for the duration of the COVID19 d eclaration under Section 564(b)(1) of the Act, 21 U.S.C.  section 360bbb 3(b)(1), unless the authorization is terminated or revoked sooner. Performed at [facility], [address] [place], [place] [address]   Urine culture     Status: Abnormal   Collection Time: 04/24/19 11:58 AM  Result Value Ref Range Status   Specimen Description   Final    URINE, RANDOM Performed at [facility], [address] [place], [place] [address]    Special Requests   Final    NONE Performed at [facility], [address] [place], [place] [address]    Culture (A)  Final    10,000 COLONIES/mL GROUP B STREP(S.AGALACTIAE)ISOLATED TESTING AGAINST S. AGALACTIAE NOT ROUTINELY PERFORMED DUE TO PREDICTABILITY OF AMP/PEN/VAN SUSCEPTIBILITY. Performed at [facility], [place] [address] [place], [place] [address]    Report Status 04/25/2019 FINAL  Final         Radiology Studies: Ct Angio Chest Pe W/cm &/or Wo Cm  Result Date: 04/24/2019 CLINICAL DATA:  Hypoxia. EXAM: CT ANGIOGRAPHY CHEST WITH CONTRAST TECHNIQUE: Multidetector CT imaging of the chest was performed using the standard protocol during bolus administration of intravenous contrast. Multiplanar CT image reconstructions and MIPs were  obtained to evaluate the vascular anatomy. CONTRAST:  5mL OMNIPAQUE IOHEXOL 350 MG/ML SOLN COMPARISON:  Radiograph of same day. FINDINGS: Cardiovascular: Satisfactory opacification of the pulmonary arteries to the segmental level. No evidence of pulmonary embolism. Normal heart size. No pericardial effusion. Atherosclerosis of thoracic aorta is noted without aneurysm or dissection. Mediastinum/Nodes: No enlarged mediastinal, hilar, or axillary lymph nodes. Thyroid gland, trachea, and esophagus demonstrate no significant findings. Lungs/Pleura: No pneumothorax or pleural effusion is noted. Minimal right posterior basilar subsegmental atelectasis is noted. Focal opacity is noted posteriorly in the right upper lobe which may represent focal atelectasis or inflammation. Upper Abdomen: No acute abnormality. Musculoskeletal: No chest wall abnormality. No acute or significant osseous findings. Review of the MIP images confirms the above findings. IMPRESSION: No definite evidence of pulmonary embolus. Small focal opacity is noted posteriorly in the right upper lobe which may represent focal atelectasis or inflammation. Aortic Atherosclerosis (ICD10-I70.0). Electronically Signed   By: Marijo Conception M.D.   On: 04/24/2019 13:20   Dg Chest Port 1 View  Result Date: 04/24/2019 CLINICAL DATA:  Hypoxia, bleeding EXAM: PORTABLE CHEST 1 VIEW COMPARISON:  01/05/2018 FINDINGS: The lungs are hyperinflated likely secondary to COPD. There is no focal parenchymal opacity. There is no pleural effusion or pneumothorax. The heart and mediastinal contours are unremarkable. The osseous structures are unremarkable. IMPRESSION: No active disease. Electronically Signed   By: Kathreen Devoid   On: 04/24/2019 12:17        Scheduled Meds: . cefdinir  300 mg Oral Q12H  . cyanocobalamin  1,000 mcg Subcutaneous Daily  . escitalopram  10 mg Oral QHS  . mometasone-formoterol  2 puff Inhalation BID  . nicotine  21 mg Transdermal Q24H  .  pantoprazole  40 mg Oral Daily  . polycarbophil  625 mg Oral Daily  . raloxifene  60 mg Oral Daily  . simvastatin  20 mg Oral QHS  . sodium chloride flush  3 mL Intravenous Q12H  . terbinafine  250 mg Oral Daily  . umeclidinium bromide  1 puff Inhalation Daily   Continuous Infusions: . sodium chloride       LOS: 2 days    Time spent: 35 minutes    Irine Seal, MD [facility]  If 7PM-7AM, please contact  night-coverage [email or web address] 04/26/2019, 11:14 AM

## 2019-04-27 DIAGNOSIS — E538 Deficiency of other specified B group vitamins: Secondary | ICD-10-CM | POA: Diagnosis present

## 2019-04-27 DIAGNOSIS — J432 Centrilobular emphysema: Secondary | ICD-10-CM | POA: Diagnosis present

## 2019-04-27 DIAGNOSIS — J449 Chronic obstructive pulmonary disease, unspecified: Secondary | ICD-10-CM | POA: Diagnosis present

## 2019-04-27 LAB — BASIC METABOLIC PANEL
Anion gap: 5 (ref 5–15)
BUN: 8 mg/dL (ref 8–23)
CO2: 28 mmol/L (ref 22–32)
Calcium: 8.3 mg/dL — ABNORMAL LOW (ref 8.9–10.3)
Chloride: 105 mmol/L (ref 98–111)
Creatinine, Ser: 0.49 mg/dL (ref 0.44–1.00)
GFR calc Af Amer: >60 mL/min (ref >60)
GFR calc non Af Amer: >60 mL/min (ref >60)
Glucose, Bld: 78 mg/dL (ref 70–99)
Potassium: 3.9 mmol/L (ref 3.5–5.1)
Sodium: 138 mmol/L (ref 135–145)

## 2019-04-27 LAB — FOLATE RBC
Folate, Hemolysate: 592 ng/mL
Folate, RBC: 1403 ng/mL (ref >498)
Hematocrit: 42.2 % (ref 34.0–46.6)

## 2019-04-27 LAB — HEMOGLOBIN AND HEMATOCRIT, BLOOD
HCT: 40.1 % (ref 36.0–46.0)
Hemoglobin: 12.9 g/dL (ref 12.0–15.0)

## 2019-04-27 MED ORDER — SENNOSIDES-DOCUSATE SODIUM 8.6-50 MG PO TABS: 1.0000 | ORAL_TABLET | Freq: Every day | ORAL | Status: DC

## 2019-04-27 MED ORDER — CEFDINIR 300 MG PO CAPS: 300.0000 mg | ORAL_CAPSULE | Freq: Two times a day (BID) | ORAL | 0 refills | Status: AC

## 2019-04-27 MED ORDER — NICOTINE 21 MG/24HR TD PT24: 21.0000 mg | MEDICATED_PATCH | TRANSDERMAL | 0 refills | Status: DC

## 2019-04-27 MED ORDER — SENNOSIDES-DOCUSATE SODIUM 8.6-50 MG PO TABS
1.0000 | ORAL_TABLET | Freq: Every day | ORAL | Status: DC
  Administered 2019-04-27: 1 via ORAL
  Filled 2019-04-27: qty 1

## 2019-04-27 MED ORDER — TERBINAFINE HCL 250 MG PO TABS: 250.0000 mg | ORAL_TABLET | Freq: Every day | ORAL | 0 refills | Status: AC

## 2019-04-27 MED ORDER — FLUTICASONE-SALMETEROL 250-50 MCG/DOSE IN AEPB: 1.0000 | INHALATION_SPRAY | Freq: Two times a day (BID) | RESPIRATORY_TRACT | 1 refills | Status: DC

## 2019-04-27 MED ORDER — VITAMIN B-12 1000 MCG PO TABS: 1000.0000 ug | ORAL_TABLET | Freq: Every day | ORAL | Status: DC

## 2019-04-27 MED ORDER — UMECLIDINIUM BROMIDE 62.5 MCG/INH IN AEPB: 1.0000 | INHALATION_SPRAY | Freq: Every day | RESPIRATORY_TRACT | 1 refills | Status: DC

## 2019-04-27 MED ORDER — LIP MEDEX EX OINT
TOPICAL_OINTMENT | CUTANEOUS | Status: AC
  Filled 2019-04-27: qty 7

## 2019-04-27 NOTE — Progress Notes (Signed)
SATURATION QUALIFICATIONS: (This note is used to comply with regulatory documentation for home oxygen)  Patient Saturations on Room Air at Rest = 89%  Patient Saturations on Room Air while Ambulating = 85%  Patient Saturations on 2 Liters of oxygen while Ambulating = 90%  Please briefly explain why patient needs home oxygen:

## 2019-04-27 NOTE — Evaluation (Addendum)
Physical Therapy Evaluation Patient Details Name: Carol [name] MRN: [id number] DOB: [birth year] Today's Date: 04/27/2019      Patient Saturations on Room Air while Ambulating = 85%  Patient Saturations on 2 Liters of oxygen while Ambulating = 89-90%     History of Present Illness  83 yo female admitted with LGIB, hypoxia. Hx of osteoporosis, anxiety, OA  Clinical Impression  On eval, pt was Supv-Mod Ind with mobility. She walked ~250 feet without a device. No LOB. Pt tolerated activity well. No PT needs. 1x eval. Will sign off. Replaced [place] O2 end of session-titrated down to 3L (pt was on 4L)-RN aware. Recommend daily ambulation and continued O2 needs assessment with nursing.     Follow Up Recommendations No PT follow up    Equipment Recommendations  None recommended by PT    Recommendations for Other Services       Precautions / Restrictions Precautions Precautions: None Restrictions Weight Bearing Restrictions: No      Mobility  Bed Mobility Overal bed mobility: Modified Independent                Transfers Overall transfer level: Modified independent                  Ambulation/Gait Ambulation/Gait assistance: Supervision Gait Distance (Feet): 250 Feet Assistive device: None Gait Pattern/deviations: WFL(Within Functional Limits)     General Gait Details: for safety, lines/equipment. No LOB. Pt tolerated activity well.   Stairs            Wheelchair Mobility    Modified Rankin (Stroke Patients Only)       Balance Overall balance assessment: Modified Independent                                           Pertinent Vitals/Pain Pain Assessment: No/denies pain    Home Living Family/patient expects to be discharged to:: Private residence Living Arrangements: Alone   Type of Home: House Home Access: Stairs to enter   Technical brewer of Steps: 4 Home Layout: One level Home Equipment: None       Prior Function Level of Independence: Independent               Hand Dominance        Extremity/Trunk Assessment   Upper Extremity Assessment Upper Extremity Assessment: Defer to OT evaluation    Lower Extremity Assessment Lower Extremity Assessment: Overall WFL for tasks assessed    Cervical / Trunk Assessment Cervical / Trunk Assessment: Kyphotic  Communication   Communication: No difficulties  Cognition Arousal/Alertness: Awake/alert Behavior During Therapy: WFL for tasks assessed/performed Overall Cognitive Status: Within Functional Limits for tasks assessed                                        General Comments      Exercises     Assessment/Plan    PT Assessment Patent does not need any further PT services  PT Problem List         PT Treatment Interventions      PT Goals (Current goals can be found in the Care Plan section)  Acute Rehab PT Goals Patient Stated Goal: none stated PT Goal Formulation: All assessment and education complete, DC therapy    Frequency  Barriers to discharge        Co-evaluation               AM-PAC PT "6 Clicks" Mobility  Outcome Measure Help needed turning from your back to your side while in a flat bed without using bedrails?: None Help needed moving from lying on your back to sitting on the side of a flat bed without using bedrails?: None Help needed moving to and from a bed to a chair (including a wheelchair)?: None Help needed standing up from a chair using your arms (e.g., wheelchair or bedside chair)?: None Help needed to walk in hospital room?: A Little Help needed climbing 3-5 steps with a railing? : A Little 6 Click Score: 22    End of Session Equipment Utilized During Treatment: Gait belt;Oxygen Activity Tolerance: Patient tolerated treatment well Patient left: in chair;with call bell/phone within reach;with chair alarm set        Time: 0940-1004 PT Time Calculation  (min) (ACUTE ONLY): 24 min   Charges:   PT Evaluation $PT Eval Moderate Complexity: 1 Mod PT Treatments $Gait Training: 8-22 mins         Weston Anna, PT Acute Rehabilitation Services Pager: [phone] Office: [phone]

## 2019-04-27 NOTE — Discharge Summary (Signed)
Physician Discharge Summary  Carol [name] YCX:[id number] DOB: April 29, 1936 DOA: 04/24/2019  PCP: Shirline Frees, MD  Admit date: 04/24/2019 Discharge date: 04/27/2019  Time spent: 50 minutes  Recommendations for Outpatient Follow-up:  1. Follow-up with Shirline Frees, MD in 1 to 2 weeks.  On follow-up patient will need a CBC done to follow-up on H&H.  Patient may need outpatient referral to GI.  Patient blood pressure also need to be reassessed as patient's ACE inhibitor was discontinued on discharge as blood pressure was stable and was borderline during the hospitalization.  Patient also need follow-up on low vitamin B12 levels (240).  Patient may need referral to outpatient pulmonary for further evaluation of COPD and may need formal PFTs/spirometry done.  Patient will need ambulatory O2 sats repeated on follow-up.   Discharge Diagnoses:  Principal Problem:   Hematochezia Active Problems:   Hypoxia   Tobacco abuse   Anxiety   Osteoporosis   Essential hypertension   Cystitis   Tinea cruris   Gastrointestinal hemorrhage   Discharge Condition: Stable and improved  Diet recommendation: Regular  Filed Weights   04/24/19 1025 04/24/19 1750  Weight: 47.6 kg 47.7 kg    History of present illness:  Per Dr. Si Raider Carol [name] is a 83 y.o. female with medical history significant for htn, 53 pack-year smoking history, osteoporosis, and anxiety, who presents with above.  Had an episode of rectal bleeding a little over a year ago. At that time had pain and diarrhea. Treated for infectious colitis. Thought possibly ischemic. F/u colonoscopy a month later saw diverticulosis, internal hemorrhoids, and one small polyp. Otherwise unremarkable.   Denies prior history of rectal bleeding.  States was in usual state of health when last night felt urge to defecate. Had a large volume bowel movement that toward the end became soft and watery. Did not notice blood or mucous. Has had  occasional constipation lately and was recently started on miralax; says recent bowel movements have been relatively regular.  This morning felt the urge to pass flatus and when she did it was in fact blood, a little more than the amount of a menstrual period. That happened once more today. She presented to the [facility] ED to evaluate this.  Denies abdominal pain. Denies fever. Denies shortness of breath. Has chronic occasional cough but no change from that baseline. Denies significant or worsening dyspnea on exertion or at rest.   Patient also notes increased urinary frequency last few days. No blood in urine. No fevers or flank pain.  Patient also reports several weeks itchy burning rash in groin. PCP has been treating telephonically, first with creams, then with a pill she took once and then again in a week. That pill helped some but how has returned. Very itchy.    ED Course: hypoxic to 80 when ambulating. Labs, CTA.  Hospital Course:  1 lower GI bleed/hematochezia Questionable etiology.  Differential diagnosis includes hemorrhoids versus ischemic colitis as patient noted to have some abdominal cramping per patient prior to bloody bowel movement prior to admission.  Patient also noted to have history of constipation and large bowel movement.  Patient stated had a bloody bowel movement the morning of 04/25/2019.  No further bloody bowel movements during the hospitalization.  Patient's hemoglobin stabilized at 12.9 by day of discharge.  Patient was hydrated gently with IV fluids.  Patient improved clinically and was stable by day of discharge.  Patient be discharged home in stable and improved condition.  Outpatient follow-up  with PCP.  2.  Hypoxia secondary to probable chronic COPD Asymptomatic.  Likely secondary to probable chronic COPD.  Chest x-ray with hyperinflation with findings consistent with COPD.  Patient with a greater than 50-pack-year history of tobacco use and ongoing tobacco  use.  No formal diagnosis of COPD.  Patient speaking in full sentences.  Patient noted to have sats of 85% on room air.  Per admitting physician at [facility] patient noted to desat to the 80s on ambulation.  CT angiogram chest which was done was negative for PE, infiltrate.  SARS-CoV-2 negative.  Ambulatory sats of 78% on room air, with sats going up to 90% on 4 L.  Patient was started on Dulera and Incruse during the hospitalization.  Patient be discharged home on dose inhalers as well as home oxygen at 2 L nasal cannula continuously.  Outpatient follow-up.  3.  Tinea cruris Patient noted to have failed outpatient creams and possibly oral fluconazole.  CRP within normal limits.  Patient started on terbinafine with some clinical improvement, which patient was discharged home on.  Outpatient follow-up with PCP.   4.  Dehydration Patient noted to be dehydrated on admission.  Patient hydrated with IV fluids and was euvolemic by day of discharge.   5.  Acute cystitis Urinalysis with positive nitrites, trace leukocytes, few bacteria.  Urine cultures with 10,000 colonies of group B strep.  Patient presented with dysuria and symptomatic and as such patient was maintained on Omnicef.  Patient improved clinically.  Patient be discharged home on 2 more days of Omnicef to complete a 5-day course of antibiotic treatment.  Outpatient follow-up with PCP.   6.  Low vitamin B12 levels Patient noted to have low vitamin B12 levels at 240.  Patient was started on vitamin B12 1000 MCG subcutaneous daily during the hospitalization.  Patient be discharged home on oral vitamin B 1000 MCG's daily.  Outpatient follow-up with PCP.   7.  Gastroesophageal reflux disease Patient was maintained on a PPI.  8.  Hypertension Blood pressure somewhat borderline.  Blood pressure improved with discontinuation of home regimen of antihypertensive medications.  Patient's benazepril has been discontinued on discharge and  patient be placed back on home regimen of Norvasc.  Outpatient follow-up with PCP.   9.  Tobacco abuse Patient with greater than 50-pack-year tobacco abuse.  Patient was maintained on a nicotine patch.  Tobacco cessation stressed to patient.  10.  Anxiety disorder Stable.    Patient maintained on home regimen of Lexapro and alprazolam as needed.    Procedures:    Consultations:  None  Discharge Exam: Vitals:   04/27/19 0427 04/27/19 0830  BP: 128/66   Pulse: 85   Resp: 16   Temp: 98.6 F (37 C)   SpO2: 98% 90%    General: NAD Cardiovascular: RRR Respiratory: CTAB  Discharge Instructions   Discharge Instructions    Diet general   Complete by:  As directed    Increase activity slowly   Complete by:  As directed      Allergies as of 04/27/2019      Reactions   Ciprofloxacin Nausea Only, Other (See Comments)   Nausea and legs weak   Nitrofuran Derivatives Nausea Only, Other (See Comments)   nitrofuratonin bad nausea, legs weak   Sulfa Antibiotics Other (See Comments)   jittery      Medication List    STOP taking these medications   benazepril 20 MG tablet Commonly known as:  LOTENSIN     TAKE these medications   acetaminophen 650 MG CR tablet Commonly known as:  TYLENOL Take 1,300 mg by mouth daily as needed for pain.   ALIGN PO Take 1 tablet by mouth daily.   ALPRAZolam 0.25 MG tablet Commonly known as:  XANAX Take 0.25 mg by mouth 2 (two) times daily as needed for anxiety.   amLODipine 5 MG tablet Commonly known as:  NORVASC Take 5 mg by mouth daily.   CALTRATE 600+D PLUS PO Take 1 tablet by mouth daily.   cefdinir 300 MG capsule Commonly known as:  OMNICEF Take 1 capsule (300 mg total) by mouth every 12 (twelve) hours for 2 days.   escitalopram 10 MG tablet Commonly known as:  LEXAPRO Take 10 mg by mouth at bedtime.   Fluticasone-Salmeterol 250-50 MCG/DOSE Aepb Commonly known as:  Advair Diskus Inhale 1 puff into the lungs 2  (two) times a day.   MAGNESIUM CARBONATE PO Take 1 tablet by mouth daily.   multivitamin with minerals Tabs tablet Take 1 tablet by mouth daily.   nicotine 21 mg/24hr patch Commonly known as:  NICODERM CQ - dosed in mg/24 hours Place 1 patch (21 mg total) onto the skin daily.   ondansetron 4 MG disintegrating tablet Commonly known as:  Zofran ODT Take 1 tablet (4 mg total) by mouth every 8 (eight) hours as needed for nausea or vomiting.   pantoprazole 40 MG tablet Commonly known as:  PROTONIX Take 40 mg by mouth daily.   polycarbophil 625 MG tablet Commonly known as:  FIBERCON Take 625 mg by mouth daily.   raloxifene 60 MG tablet Commonly known as:  EVISTA Take 60 mg by mouth daily.   senna-docusate 8.6-50 MG tablet Commonly known as:  Senokot-S Take 1 tablet by mouth daily. Start taking on:  April 28, 2019   simvastatin 20 MG tablet Commonly known as:  ZOCOR Take 20 mg by mouth at bedtime.   terbinafine 250 MG tablet Commonly known as:  LAMISIL Take 1 tablet (250 mg total) by mouth daily for 7 days. Start taking on:  April 28, 2019   umeclidinium bromide 62.5 MCG/INH Aepb Commonly known as:  INCRUSE ELLIPTA Inhale 1 puff into the lungs daily. Start taking on:  April 28, 2019   vitamin B-12 1000 MCG tablet Commonly known as:  CYANOCOBALAMIN Take 1 tablet (1,000 mcg total) by mouth daily.            Durable Medical Equipment  (From admission, onward)         Start     Ordered   04/27/19 1206  For home use only DME oxygen  Once    Question Answer Comment  Length of Need Lifetime   Mode or (Route) Nasal cannula   Liters per Minute 3   Frequency Continuous (stationary and portable oxygen unit needed)   Oxygen conserving device Yes   Oxygen delivery system Gas      04/27/19 1205         Allergies  Allergen Reactions  . Ciprofloxacin Nausea Only and Other (See Comments)    Nausea and legs weak  . Nitrofuran Derivatives Nausea Only and Other (See  Comments)    nitrofuratonin bad nausea, legs weak  . Sulfa Antibiotics Other (See Comments)    jittery   Follow-up Information    [facility] Follow up.   Why:  [facility] Contact information: [place] [address] [phone]  Shirline Frees, MD. Schedule an appointment as soon as possible for a visit in 1 week(s).   Specialty:  Family Medicine Why:  f/u in 1-2 weeks. Contact information: [place] [address] [phone]            The results of significant diagnostics from this hospitalization (including imaging, microbiology, ancillary and laboratory) are listed below for reference.    Significant Diagnostic Studies: Ct Angio Chest Pe W/cm &/or Wo Cm  Result Date: 04/24/2019 CLINICAL DATA:  Hypoxia. EXAM: CT ANGIOGRAPHY CHEST WITH CONTRAST TECHNIQUE: Multidetector CT imaging of the chest was performed using the standard protocol during bolus administration of intravenous contrast. Multiplanar CT image reconstructions and MIPs were obtained to evaluate the vascular anatomy. CONTRAST:  38mL OMNIPAQUE IOHEXOL 350 MG/ML SOLN COMPARISON:  Radiograph of same day. FINDINGS: Cardiovascular: Satisfactory opacification of the pulmonary arteries to the segmental level. No evidence of pulmonary embolism. Normal heart size. No pericardial effusion. Atherosclerosis of thoracic aorta is noted without aneurysm or dissection. Mediastinum/Nodes: No enlarged mediastinal, hilar, or axillary lymph nodes. Thyroid gland, trachea, and esophagus demonstrate no significant findings. Lungs/Pleura: No pneumothorax or pleural effusion is noted. Minimal right posterior basilar subsegmental atelectasis is noted. Focal opacity is noted posteriorly in the right upper lobe which may represent focal atelectasis or inflammation. Upper Abdomen: No acute abnormality. Musculoskeletal: No chest wall abnormality. No acute or significant osseous findings.  Review of the MIP images confirms the above findings. IMPRESSION: No definite evidence of pulmonary embolus. Small focal opacity is noted posteriorly in the right upper lobe which may represent focal atelectasis or inflammation. Aortic Atherosclerosis (ICD10-I70.0). Electronically Signed   By: Marijo Conception M.D.   On: 04/24/2019 13:20   Dg Chest Port 1 View  Result Date: 04/24/2019 CLINICAL DATA:  Hypoxia, bleeding EXAM: PORTABLE CHEST 1 VIEW COMPARISON:  01/05/2018 FINDINGS: The lungs are hyperinflated likely secondary to COPD. There is no focal parenchymal opacity. There is no pleural effusion or pneumothorax. The heart and mediastinal contours are unremarkable. The osseous structures are unremarkable. IMPRESSION: No active disease. Electronically Signed   By: Kathreen Devoid   On: 04/24/2019 12:17    Microbiology: Recent Results (from the past 240 hour(s))  SARS Coronavirus 2 (Hosp order,Performed in [facility] lab via [employer])     Status: None   Collection Time: 04/24/19 11:56 AM  Result Value Ref Range Status   SARS Coronavirus 2 ([employer] Now) NEGATIVE NEGATIVE Final    Comment: (NOTE) Interpretive Result Comment(s): COVID 19 Positive SARS CoV 2 target nucleic acids are DETECTED. The SARS CoV 2 RNA is generally detectable in upper and lower respiratory specimens during the acute phase of infection.  Positive results are indicative of active infection with SARS CoV 2.  Clinical correlation with patient history and other diagnostic information is necessary to determine patient infection status.  Positive results do not rule out bacterial infection or coinfection with other viruses. The expected result is Negative. COVID 19 Negative SARS CoV 2 target nucleic acids are NOT DETECTED. The SARS CoV 2 RNA is generally detectable in upper and lower respiratory specimens during the acute phase of infection.  Negative results do not preclude SARS CoV 2 infection, do not rule  out coinfections with other pathogens, and should not be used as the sole basis for treatment or other patient management decisions.  Negative results must be combined with clinical  observations, patient history, and epidemiological information. The expected result is Negative.  Invalid Presence or absence of SARS CoV 2 nucleic acids cannot be determined. Repeat testing was performed on the submitted specimen and repeated Invalid results were obtained.  If clinically indicated, additional testing on a new specimen with an alternate test methodology [phone]) is advised.  The SARS CoV 2 RNA is generally detectable in upper and lower respiratory specimens during the acute phase of infection. The expected result is Negative. Fact Sheet for Patients:  GolfingFamily.no Fact Sheet for Healthcare Providers: https://www.hernandez-brewer.com/ This test is not yet approved or cleared by the [place] FDA and has been authorized for detection and/or diagnosis of SARS CoV 2 by FDA under an Emergency Use Authorization (EUA).  This EUA will remain in effect (meaning this test can be used) for the duration of the COVID19 d eclaration under Section 564(b)(1) of the Act, 21 U.S.C. section [phone] 3(b)(1), unless the authorization is terminated or revoked sooner. Performed at [facility], [address] [place], [place] [address]   Urine culture     Status: Abnormal   Collection Time: 04/24/19 11:58 AM  Result Value Ref Range Status   Specimen Description   Final    URINE, RANDOM Performed at [facility], [address] [place], [place] [address]    Special Requests   Final    NONE Performed at [facility], [address] [place], [place] [address]    Culture (A)  Final    10,000 COLONIES/mL GROUP B STREP(S.AGALACTIAE)ISOLATED TESTING AGAINST S. AGALACTIAE NOT ROUTINELY PERFORMED DUE TO PREDICTABILITY OF AMP/PEN/VAN  SUSCEPTIBILITY. Performed at [facility], [place] [address] [place], [place] [address]    Report Status 04/25/2019 FINAL  Final     Labs: Basic Metabolic Panel: Recent Labs  Lab 04/24/19 1100 04/26/19 0430 04/27/19 0501  NA 134* 140 138  K 4.2 3.5 3.9  CL 98 107 105  CO2 27 25 28   GLUCOSE 103* 65* 78  BUN 10 9 8   CREATININE 0.52 0.49 0.49  CALCIUM 8.8* 8.3* 8.3*   Liver Function Tests: Recent Labs  Lab 04/24/19 1100  AST 15  ALT 10  ALKPHOS 37*  BILITOT 0.5  PROT 6.6  ALBUMIN 3.7   No results for input(s): LIPASE, AMYLASE in the last 168 hours. No results for input(s): AMMONIA in the last 168 hours. CBC: Recent Labs  Lab 04/24/19 1100 04/25/19 0553 04/25/19 1521 04/26/19 0430 04/26/19 1717 04/27/19 0501  WBC 11.3* 8.4  --  7.5  --   --   NEUTROABS 8.9*  --   --   --   --   --   HGB 14.0 13.6 13.8 12.8 13.4 12.9  HCT 42.8 43.0 42.6 40.1 42.8 40.1  MCV 104.4* 107.2*  --  108.1*  --   --   PLT 264 258  --  239  --   --    Cardiac Enzymes: No results for input(s): CKTOTAL, CKMB, CKMBINDEX, TROPONINI in the last 168 hours. BNP: BNP (last 3 results) No results for input(s): BNP in the last 8760 hours.  ProBNP (last 3 results) No results for input(s): PROBNP in the last 8760 hours.  CBG: No results for input(s): GLUCAP in the last 168 hours.     Signed:  Irine Seal MD.  [facility] 04/27/2019, 12:58 PM

## 2019-04-27 NOTE — Progress Notes (Signed)
Pt discharged from the unit via wheelchair. Discharge instructions were reviewed with the pt prior to discharge. [facility] delivered oxygen and pt educated. No questions or concerns at this time.

## 2019-04-27 NOTE — Care Management Important Message (Signed)
Important Message  Patient Details IM Letter given to Dessa Phi RN to present to the Patient Name: Carol [name] MRN: [id number] Date of Birth: 13-Mar-1936   Medicare Important Message Given:  Yes    Kerin Salen 04/27/2019, 12:08 PM

## 2019-04-27 NOTE — Care Management (Signed)
Dx: COPD per attending.

## 2019-04-27 NOTE — TOC Transition Note (Signed)
Transition of Care [facility]) - CM/SW Discharge Note   Patient Details  Name: Carol [name] MRN: [id number] Date of Birth: October 02, 1936  [facility] CM/SW Contact:  Dessa Phi, RN Phone Number: 04/27/2019, 12:36 PM   Clinical Narrative: Patient qualifies for home 02, order placed-[employer] rep Zach aware to deliver home 02 to rm prior d/c. Patient has own transport home. No further CM needs.      Final next level of care: Home/Self Care Barriers to Discharge: No Barriers Identified   Patient Goals and CMS Choice Patient states their goals for this hospitalization and ongoing recovery are:: go home CMS Medicare.gov Compare Post Acute Care list provided to:: Patient Choice offered to / list presented to : Patient  Discharge Placement                       Discharge Plan and Services                DME Arranged: Oxygen DME Agency: [facility] Date DME Agency Contacted: 04/27/19 Time DME Agency Contacted: [id number] Representative spoke with at DME Agency: [place] ([place]) Interventions     Readmission Risk Interventions No flowsheet data found.

## 2019-04-27 NOTE — Evaluation (Signed)
Occupational Therapy Evaluation Patient Details Name: Carol [name] MRN: [id number] DOB: 1936/03/09 Today's Date: 04/27/2019    History of Present Illness 83 yo female admitted with LGIB, hypoxia. Hx of osteoporosis, anxiety, OA   Clinical Impression    OT education complete.  Energy conservation handout provided.  Education regarding ADL's with oxygen cord also provided. Pt doing well managing cord  Follow Up Recommendations  No OT follow up    Equipment Recommendations  None recommended by OT    Recommendations for Other Services       Precautions / Restrictions Precautions Precautions: None Restrictions Weight Bearing Restrictions: No      Mobility Bed Mobility Overal bed mobility: Modified Independent                Transfers Overall transfer level: Modified independent                    Balance Overall balance assessment: Modified Independent                                         ADL either performed or assessed with clinical judgement   ADL Overall ADL's : Modified independent                                       General ADL Comments: Pt overall mod I.  OT provided and went over in detail energy conservation handout.  Pt verbalized understanding.     Vision Patient Visual Report: No change from baseline              Pertinent Vitals/Pain Pain Assessment: No/denies pain     Hand Dominance     Extremity/Trunk Assessment Upper Extremity Assessment Upper Extremity Assessment: Generalized weakness           Communication Communication Communication: No difficulties   Cognition Arousal/Alertness: Awake/alert Behavior During Therapy: WFL for tasks assessed/performed Overall Cognitive Status: Within Functional Limits for tasks assessed                                                Home Living Family/patient expects to be discharged to:: Private residence Living  Arrangements: Alone Available Help at Discharge: Family Type of Home: House Home Access: Stairs to enter Technical brewer of Steps: 4   Home Layout: One level     Bathroom Shower/Tub: Teacher, early years/pre: Standard     Home Equipment: None          Prior Functioning/Environment Level of Independence: Independent                 OT Problem List: Decreased strength;Decreased activity tolerance;Decreased safety awareness;Impaired balance (sitting and/or standing)      OT Treatment/Interventions:      OT Goals(Current goals can be found in the care plan section) Acute Rehab OT Goals Patient Stated Goal: none stated OT Goal Formulation: With patient  OT Frequency:      AM-PAC OT "6 Clicks" Daily Activity     Outcome Measure Help from another person eating meals?: None Help from another person taking care of personal grooming?: None Help from another person toileting, which includes  using toliet, bedpan, or urinal?: None Help from another person bathing (including washing, rinsing, drying)?: None Help from another person to put on and taking off regular upper body clothing?: None Help from another person to put on and taking off regular lower body clothing?: None 6 Click Score: 24   End of Session Nurse Communication: Mobility status  Activity Tolerance: Patient tolerated treatment well Patient left: in chair  OT Visit Diagnosis: Unsteadiness on feet (R26.81);Muscle weakness (generalized) (M62.81)                Time: 0813-8871 OT Time Calculation (min): 29 min Charges:  OT General Charges $OT Visit: 1 Visit OT Evaluation $OT Eval Moderate Complexity: 1 Mod OT Treatments $Self Care/Home Management : 8-22 mins  Kari Baars, OT Acute Rehabilitation Services [phone] Office- [phone], Edwena Felty D 04/27/2019, 3:38 PM

## 2019-05-11 DIAGNOSIS — E538 Deficiency of other specified B group vitamins: Secondary | ICD-10-CM | POA: Diagnosis not present

## 2019-05-11 DIAGNOSIS — R0902 Hypoxemia: Secondary | ICD-10-CM | POA: Diagnosis not present

## 2019-05-11 DIAGNOSIS — B356 Tinea cruris: Secondary | ICD-10-CM | POA: Diagnosis not present

## 2019-05-11 DIAGNOSIS — I10 Essential (primary) hypertension: Secondary | ICD-10-CM | POA: Diagnosis not present

## 2019-05-11 DIAGNOSIS — K921 Melena: Secondary | ICD-10-CM | POA: Diagnosis not present

## 2019-05-11 DIAGNOSIS — F324 Major depressive disorder, single episode, in partial remission: Secondary | ICD-10-CM | POA: Diagnosis not present

## 2019-05-11 DIAGNOSIS — F419 Anxiety disorder, unspecified: Secondary | ICD-10-CM | POA: Diagnosis not present

## 2019-05-11 DIAGNOSIS — K219 Gastro-esophageal reflux disease without esophagitis: Secondary | ICD-10-CM | POA: Diagnosis not present

## 2019-05-11 DIAGNOSIS — F17210 Nicotine dependence, cigarettes, uncomplicated: Secondary | ICD-10-CM | POA: Diagnosis not present

## 2019-05-22 DIAGNOSIS — N9089 Other specified noninflammatory disorders of vulva and perineum: Secondary | ICD-10-CM | POA: Diagnosis not present

## 2019-05-26 ENCOUNTER — Ambulatory Visit: Payer: Medicare Other

## 2019-06-02 DIAGNOSIS — L309 Dermatitis, unspecified: Secondary | ICD-10-CM | POA: Diagnosis not present

## 2019-07-06 ENCOUNTER — Other Ambulatory Visit: Payer: Self-pay

## 2019-07-06 ENCOUNTER — Ambulatory Visit
Admission: RE | Admit: 2019-07-06 | Discharge: 2019-07-06 | Disposition: A | Discharge status: Home / Self Care | Payer: Medicare Other | Source: Ambulatory Visit | Attending: Family Medicine | Admitting: Family Medicine

## 2019-07-06 DIAGNOSIS — Z1231 Encounter for screening mammogram for malignant neoplasm of breast: Secondary | ICD-10-CM | POA: Diagnosis not present

## 2020-03-08 ENCOUNTER — Other Ambulatory Visit: Payer: Self-pay | Admitting: Family Medicine

## 2020-03-08 DIAGNOSIS — M810 Age-related osteoporosis without current pathological fracture: Secondary | ICD-10-CM

## 2020-04-18 DIAGNOSIS — J449 Chronic obstructive pulmonary disease, unspecified: Secondary | ICD-10-CM | POA: Diagnosis not present

## 2020-04-18 DIAGNOSIS — F419 Anxiety disorder, unspecified: Secondary | ICD-10-CM | POA: Diagnosis not present

## 2020-04-18 DIAGNOSIS — J301 Allergic rhinitis due to pollen: Secondary | ICD-10-CM | POA: Diagnosis not present

## 2020-04-18 DIAGNOSIS — E7800 Pure hypercholesterolemia, unspecified: Secondary | ICD-10-CM | POA: Diagnosis not present

## 2020-04-18 DIAGNOSIS — M7062 Trochanteric bursitis, left hip: Secondary | ICD-10-CM | POA: Diagnosis not present

## 2020-04-18 DIAGNOSIS — M810 Age-related osteoporosis without current pathological fracture: Secondary | ICD-10-CM | POA: Diagnosis not present

## 2020-04-18 DIAGNOSIS — Z0000 Encounter for general adult medical examination without abnormal findings: Secondary | ICD-10-CM | POA: Diagnosis not present

## 2020-04-18 DIAGNOSIS — I10 Essential (primary) hypertension: Secondary | ICD-10-CM | POA: Diagnosis not present

## 2020-04-18 DIAGNOSIS — F324 Major depressive disorder, single episode, in partial remission: Secondary | ICD-10-CM | POA: Diagnosis not present

## 2020-05-20 ENCOUNTER — Other Ambulatory Visit: Payer: Self-pay

## 2020-05-20 ENCOUNTER — Ambulatory Visit
Admission: RE | Admit: 2020-05-20 | Discharge: 2020-05-20 | Disposition: A | Discharge status: Home / Self Care | Payer: Medicare Other | Source: Ambulatory Visit | Attending: Family Medicine | Admitting: Family Medicine

## 2020-05-20 DIAGNOSIS — M810 Age-related osteoporosis without current pathological fracture: Secondary | ICD-10-CM

## 2020-08-22 ENCOUNTER — Other Ambulatory Visit: Payer: Self-pay | Admitting: Family Medicine

## 2020-08-22 DIAGNOSIS — Z1231 Encounter for screening mammogram for malignant neoplasm of breast: Secondary | ICD-10-CM

## 2020-09-09 ENCOUNTER — Ambulatory Visit: Payer: Medicare Other

## 2020-09-14 ENCOUNTER — Ambulatory Visit
Admission: RE | Admit: 2020-09-14 | Discharge: 2020-09-14 | Disposition: A | Discharge status: Home / Self Care | Payer: Medicare Other | Source: Ambulatory Visit | Attending: Family Medicine | Admitting: Family Medicine

## 2020-09-14 ENCOUNTER — Other Ambulatory Visit: Payer: Self-pay

## 2020-09-14 DIAGNOSIS — Z1231 Encounter for screening mammogram for malignant neoplasm of breast: Secondary | ICD-10-CM

## 2020-10-26 DIAGNOSIS — I10 Essential (primary) hypertension: Secondary | ICD-10-CM | POA: Diagnosis not present

## 2020-10-26 DIAGNOSIS — F324 Major depressive disorder, single episode, in partial remission: Secondary | ICD-10-CM | POA: Diagnosis not present

## 2020-10-26 DIAGNOSIS — J301 Allergic rhinitis due to pollen: Secondary | ICD-10-CM | POA: Diagnosis not present

## 2020-10-26 DIAGNOSIS — F17200 Nicotine dependence, unspecified, uncomplicated: Secondary | ICD-10-CM | POA: Diagnosis not present

## 2020-10-26 DIAGNOSIS — L570 Actinic keratosis: Secondary | ICD-10-CM | POA: Diagnosis not present

## 2020-10-26 DIAGNOSIS — E7800 Pure hypercholesterolemia, unspecified: Secondary | ICD-10-CM | POA: Diagnosis not present

## 2020-10-26 DIAGNOSIS — M810 Age-related osteoporosis without current pathological fracture: Secondary | ICD-10-CM | POA: Diagnosis not present

## 2020-11-24 DIAGNOSIS — Z20822 Contact with and (suspected) exposure to covid-19: Secondary | ICD-10-CM | POA: Diagnosis not present

## 2021-01-23 DIAGNOSIS — H903 Sensorineural hearing loss, bilateral: Secondary | ICD-10-CM | POA: Insufficient documentation

## 2021-01-23 DIAGNOSIS — J300 Vasomotor rhinitis: Secondary | ICD-10-CM | POA: Diagnosis not present

## 2021-05-10 DIAGNOSIS — F419 Anxiety disorder, unspecified: Secondary | ICD-10-CM | POA: Diagnosis not present

## 2021-05-10 DIAGNOSIS — J301 Allergic rhinitis due to pollen: Secondary | ICD-10-CM | POA: Diagnosis not present

## 2021-05-10 DIAGNOSIS — F324 Major depressive disorder, single episode, in partial remission: Secondary | ICD-10-CM | POA: Diagnosis not present

## 2021-05-10 DIAGNOSIS — Z0000 Encounter for general adult medical examination without abnormal findings: Secondary | ICD-10-CM | POA: Diagnosis not present

## 2021-05-10 DIAGNOSIS — L570 Actinic keratosis: Secondary | ICD-10-CM | POA: Diagnosis not present

## 2021-05-10 DIAGNOSIS — E7800 Pure hypercholesterolemia, unspecified: Secondary | ICD-10-CM | POA: Diagnosis not present

## 2021-05-10 DIAGNOSIS — K219 Gastro-esophageal reflux disease without esophagitis: Secondary | ICD-10-CM | POA: Diagnosis not present

## 2021-05-10 DIAGNOSIS — I10 Essential (primary) hypertension: Secondary | ICD-10-CM | POA: Diagnosis not present

## 2021-05-10 DIAGNOSIS — I700 Atherosclerosis of aorta: Secondary | ICD-10-CM | POA: Diagnosis not present

## 2021-05-10 DIAGNOSIS — M7062 Trochanteric bursitis, left hip: Secondary | ICD-10-CM | POA: Diagnosis not present

## 2021-05-10 DIAGNOSIS — J449 Chronic obstructive pulmonary disease, unspecified: Secondary | ICD-10-CM | POA: Diagnosis not present

## 2021-05-10 DIAGNOSIS — M810 Age-related osteoporosis without current pathological fracture: Secondary | ICD-10-CM | POA: Diagnosis not present

## 2021-08-09 ENCOUNTER — Other Ambulatory Visit: Payer: Self-pay | Admitting: Family Medicine

## 2021-08-09 DIAGNOSIS — Z1231 Encounter for screening mammogram for malignant neoplasm of breast: Secondary | ICD-10-CM

## 2021-09-18 ENCOUNTER — Ambulatory Visit: Payer: Medicare Other

## 2021-09-19 DIAGNOSIS — Z23 Encounter for immunization: Secondary | ICD-10-CM | POA: Diagnosis not present

## 2021-10-17 ENCOUNTER — Ambulatory Visit: Payer: Medicare Other

## 2021-11-09 DIAGNOSIS — J449 Chronic obstructive pulmonary disease, unspecified: Secondary | ICD-10-CM | POA: Diagnosis not present

## 2021-11-09 DIAGNOSIS — I10 Essential (primary) hypertension: Secondary | ICD-10-CM | POA: Diagnosis not present

## 2021-11-09 DIAGNOSIS — E7800 Pure hypercholesterolemia, unspecified: Secondary | ICD-10-CM | POA: Diagnosis not present

## 2021-11-09 DIAGNOSIS — F419 Anxiety disorder, unspecified: Secondary | ICD-10-CM | POA: Diagnosis not present

## 2021-11-09 DIAGNOSIS — H6981 Other specified disorders of Eustachian tube, right ear: Secondary | ICD-10-CM | POA: Diagnosis not present

## 2021-11-09 DIAGNOSIS — M7062 Trochanteric bursitis, left hip: Secondary | ICD-10-CM | POA: Diagnosis not present

## 2021-11-09 DIAGNOSIS — F324 Major depressive disorder, single episode, in partial remission: Secondary | ICD-10-CM | POA: Diagnosis not present

## 2021-11-09 DIAGNOSIS — J301 Allergic rhinitis due to pollen: Secondary | ICD-10-CM | POA: Diagnosis not present

## 2021-11-21 ENCOUNTER — Ambulatory Visit: Payer: Medicare Other

## 2021-12-15 ENCOUNTER — Ambulatory Visit
Admission: RE | Admit: 2021-12-15 | Discharge: 2021-12-15 | Disposition: A | Discharge status: Home / Self Care | Payer: Medicare Other | Source: Ambulatory Visit | Attending: Family Medicine | Admitting: Family Medicine

## 2021-12-15 DIAGNOSIS — Z1231 Encounter for screening mammogram for malignant neoplasm of breast: Secondary | ICD-10-CM

## 2022-02-05 DIAGNOSIS — H353131 Nonexudative age-related macular degeneration, bilateral, early dry stage: Secondary | ICD-10-CM | POA: Diagnosis not present

## 2022-02-12 DIAGNOSIS — H6983 Other specified disorders of Eustachian tube, bilateral: Secondary | ICD-10-CM | POA: Diagnosis not present

## 2022-02-12 DIAGNOSIS — J342 Deviated nasal septum: Secondary | ICD-10-CM | POA: Diagnosis not present

## 2022-02-12 DIAGNOSIS — J310 Chronic rhinitis: Secondary | ICD-10-CM | POA: Diagnosis not present

## 2022-02-12 DIAGNOSIS — J343 Hypertrophy of nasal turbinates: Secondary | ICD-10-CM | POA: Diagnosis not present

## 2022-02-12 DIAGNOSIS — H903 Sensorineural hearing loss, bilateral: Secondary | ICD-10-CM | POA: Diagnosis not present

## 2022-02-12 DIAGNOSIS — H838X3 Other specified diseases of inner ear, bilateral: Secondary | ICD-10-CM | POA: Diagnosis not present

## 2022-02-14 DIAGNOSIS — F339 Major depressive disorder, recurrent, unspecified: Secondary | ICD-10-CM | POA: Diagnosis not present

## 2022-02-14 DIAGNOSIS — R5383 Other fatigue: Secondary | ICD-10-CM | POA: Diagnosis not present

## 2022-02-14 DIAGNOSIS — D7589 Other specified diseases of blood and blood-forming organs: Secondary | ICD-10-CM | POA: Diagnosis not present

## 2022-02-14 DIAGNOSIS — I10 Essential (primary) hypertension: Secondary | ICD-10-CM | POA: Diagnosis not present

## 2022-02-14 DIAGNOSIS — F419 Anxiety disorder, unspecified: Secondary | ICD-10-CM | POA: Diagnosis not present

## 2022-03-13 DIAGNOSIS — F324 Major depressive disorder, single episode, in partial remission: Secondary | ICD-10-CM | POA: Diagnosis not present

## 2022-03-13 DIAGNOSIS — R0789 Other chest pain: Secondary | ICD-10-CM | POA: Diagnosis not present

## 2022-03-13 DIAGNOSIS — E7800 Pure hypercholesterolemia, unspecified: Secondary | ICD-10-CM | POA: Diagnosis not present

## 2022-03-13 DIAGNOSIS — I10 Essential (primary) hypertension: Secondary | ICD-10-CM | POA: Diagnosis not present

## 2022-03-13 DIAGNOSIS — F419 Anxiety disorder, unspecified: Secondary | ICD-10-CM | POA: Diagnosis not present

## 2022-03-13 DIAGNOSIS — R300 Dysuria: Secondary | ICD-10-CM | POA: Diagnosis not present

## 2022-03-13 DIAGNOSIS — R1013 Epigastric pain: Secondary | ICD-10-CM | POA: Diagnosis not present

## 2022-03-16 ENCOUNTER — Inpatient Hospital Stay (HOSPITAL_COMMUNITY)
Admission: EM | Admit: 2022-03-16 | Discharge: 2022-03-20 | DRG: 193 | Disposition: A | Discharge status: Home Health | Payer: Medicare Other | Attending: Internal Medicine | Admitting: Internal Medicine

## 2022-03-16 ENCOUNTER — Encounter (HOSPITAL_COMMUNITY): Payer: Self-pay

## 2022-03-16 ENCOUNTER — Inpatient Hospital Stay (HOSPITAL_COMMUNITY): Payer: Medicare Other

## 2022-03-16 ENCOUNTER — Emergency Department (HOSPITAL_COMMUNITY): Payer: Medicare Other

## 2022-03-16 ENCOUNTER — Other Ambulatory Visit: Payer: Self-pay

## 2022-03-16 DIAGNOSIS — F17210 Nicotine dependence, cigarettes, uncomplicated: Secondary | ICD-10-CM | POA: Diagnosis present

## 2022-03-16 DIAGNOSIS — R059 Cough, unspecified: Secondary | ICD-10-CM | POA: Diagnosis not present

## 2022-03-16 DIAGNOSIS — E538 Deficiency of other specified B group vitamins: Secondary | ICD-10-CM | POA: Diagnosis present

## 2022-03-16 DIAGNOSIS — F419 Anxiety disorder, unspecified: Secondary | ICD-10-CM | POA: Diagnosis present

## 2022-03-16 DIAGNOSIS — R112 Nausea with vomiting, unspecified: Secondary | ICD-10-CM | POA: Diagnosis not present

## 2022-03-16 DIAGNOSIS — K219 Gastro-esophageal reflux disease without esophagitis: Secondary | ICD-10-CM | POA: Diagnosis not present

## 2022-03-16 DIAGNOSIS — R0689 Other abnormalities of breathing: Secondary | ICD-10-CM | POA: Diagnosis not present

## 2022-03-16 DIAGNOSIS — Z882 Allergy status to sulfonamides status: Secondary | ICD-10-CM | POA: Diagnosis not present

## 2022-03-16 DIAGNOSIS — R7989 Other specified abnormal findings of blood chemistry: Secondary | ICD-10-CM | POA: Diagnosis not present

## 2022-03-16 DIAGNOSIS — E46 Unspecified protein-calorie malnutrition: Secondary | ICD-10-CM | POA: Insufficient documentation

## 2022-03-16 DIAGNOSIS — R9431 Abnormal electrocardiogram [ECG] [EKG]: Secondary | ICD-10-CM | POA: Diagnosis not present

## 2022-03-16 DIAGNOSIS — R9389 Abnormal findings on diagnostic imaging of other specified body structures: Secondary | ICD-10-CM

## 2022-03-16 DIAGNOSIS — J440 Chronic obstructive pulmonary disease with acute lower respiratory infection: Secondary | ICD-10-CM | POA: Diagnosis present

## 2022-03-16 DIAGNOSIS — E785 Hyperlipidemia, unspecified: Secondary | ICD-10-CM | POA: Diagnosis present

## 2022-03-16 DIAGNOSIS — Z881 Allergy status to other antibiotic agents status: Secondary | ICD-10-CM | POA: Diagnosis not present

## 2022-03-16 DIAGNOSIS — E43 Unspecified severe protein-calorie malnutrition: Secondary | ICD-10-CM | POA: Diagnosis present

## 2022-03-16 DIAGNOSIS — R5381 Other malaise: Secondary | ICD-10-CM | POA: Diagnosis present

## 2022-03-16 DIAGNOSIS — I10 Essential (primary) hypertension: Secondary | ICD-10-CM | POA: Diagnosis not present

## 2022-03-16 DIAGNOSIS — R918 Other nonspecific abnormal finding of lung field: Secondary | ICD-10-CM | POA: Diagnosis not present

## 2022-03-16 DIAGNOSIS — I700 Atherosclerosis of aorta: Secondary | ICD-10-CM | POA: Diagnosis not present

## 2022-03-16 DIAGNOSIS — Z20822 Contact with and (suspected) exposure to covid-19: Secondary | ICD-10-CM | POA: Diagnosis present

## 2022-03-16 DIAGNOSIS — R0902 Hypoxemia: Secondary | ICD-10-CM | POA: Diagnosis not present

## 2022-03-16 DIAGNOSIS — J9601 Acute respiratory failure with hypoxia: Secondary | ICD-10-CM | POA: Diagnosis not present

## 2022-03-16 DIAGNOSIS — J441 Chronic obstructive pulmonary disease with (acute) exacerbation: Secondary | ICD-10-CM

## 2022-03-16 DIAGNOSIS — Z720 Tobacco use: Secondary | ICD-10-CM | POA: Diagnosis present

## 2022-03-16 DIAGNOSIS — J90 Pleural effusion, not elsewhere classified: Secondary | ICD-10-CM | POA: Diagnosis not present

## 2022-03-16 DIAGNOSIS — E871 Hypo-osmolality and hyponatremia: Secondary | ICD-10-CM

## 2022-03-16 DIAGNOSIS — J189 Pneumonia, unspecified organism: Principal | ICD-10-CM | POA: Diagnosis present

## 2022-03-16 DIAGNOSIS — F32A Depression, unspecified: Secondary | ICD-10-CM | POA: Diagnosis present

## 2022-03-16 DIAGNOSIS — Z681 Body mass index (BMI) 19 or less, adult: Secondary | ICD-10-CM

## 2022-03-16 DIAGNOSIS — J439 Emphysema, unspecified: Secondary | ICD-10-CM | POA: Diagnosis not present

## 2022-03-16 LAB — CBC WITH DIFFERENTIAL/PLATELET
Abs Immature Granulocytes: 0.04 10*3/uL (ref 0.00–0.07)
Basophils Absolute: 0 10*3/uL (ref 0.0–0.1)
Basophils Relative: 0 %
Eosinophils Absolute: 0 10*3/uL (ref 0.0–0.5)
Eosinophils Relative: 1 %
HCT: 39.1 % (ref 36.0–46.0)
Hemoglobin: 13.8 g/dL (ref 12.0–15.0)
Immature Granulocytes: 1 %
Lymphocytes Relative: 17 %
Lymphs Abs: 1.4 10*3/uL (ref 0.7–4.0)
MCH: 33.8 pg (ref 26.0–34.0)
MCHC: 35.3 g/dL (ref 30.0–36.0)
MCV: 95.8 fL (ref 80.0–100.0)
Monocytes Absolute: 0.7 10*3/uL (ref 0.1–1.0)
Monocytes Relative: 8 %
Neutro Abs: 6.1 10*3/uL (ref 1.7–7.7)
Neutrophils Relative %: 73 %
Platelets: 413 10*3/uL — ABNORMAL HIGH (ref 150–400)
RBC: 4.08 MIL/uL (ref 3.87–5.11)
RDW: 13.6 % (ref 11.5–15.5)
WBC: 8.3 10*3/uL (ref 4.0–10.5)
nRBC: 0 % (ref 0.0–0.2)

## 2022-03-16 LAB — COMPREHENSIVE METABOLIC PANEL
ALT: 16 U/L (ref 0–44)
AST: 23 U/L (ref 15–41)
Albumin: 2.6 g/dL — ABNORMAL LOW (ref 3.5–5.0)
Alkaline Phosphatase: 69 U/L (ref 38–126)
Anion gap: 13 (ref 5–15)
BUN: 9 mg/dL (ref 8–23)
CO2: 26 mmol/L (ref 22–32)
Calcium: 8.6 mg/dL — ABNORMAL LOW (ref 8.9–10.3)
Chloride: 80 mmol/L — ABNORMAL LOW (ref 98–111)
Creatinine, Ser: 0.39 mg/dL — ABNORMAL LOW (ref 0.44–1.00)
GFR, Estimated: >60 mL/min (ref >60)
Glucose, Bld: 98 mg/dL (ref 70–99)
Potassium: 4 mmol/L (ref 3.5–5.1)
Sodium: 119 mmol/L — CL (ref 135–145)
Total Bilirubin: 0.9 mg/dL (ref 0.3–1.2)
Total Protein: 6.6 g/dL (ref 6.5–8.1)

## 2022-03-16 LAB — URINALYSIS, ROUTINE W REFLEX MICROSCOPIC
Bilirubin Urine: NEGATIVE
Glucose, UA: NEGATIVE mg/dL
Hgb urine dipstick: NEGATIVE
Ketones, ur: 20 mg/dL — AB
Leukocytes,Ua: NEGATIVE
Nitrite: NEGATIVE
Protein, ur: NEGATIVE mg/dL
Specific Gravity, Urine: 1.015 (ref 1.005–1.030)
pH: 6 (ref 5.0–8.0)

## 2022-03-16 LAB — BASIC METABOLIC PANEL
Anion gap: 13 (ref 5–15)
BUN: 8 mg/dL (ref 8–23)
CO2: 26 mmol/L (ref 22–32)
Calcium: 8.2 mg/dL — ABNORMAL LOW (ref 8.9–10.3)
Chloride: 82 mmol/L — ABNORMAL LOW (ref 98–111)
Creatinine, Ser: 0.41 mg/dL — ABNORMAL LOW (ref 0.44–1.00)
GFR, Estimated: >60 mL/min (ref >60)
Glucose, Bld: 102 mg/dL — ABNORMAL HIGH (ref 70–99)
Potassium: 3.8 mmol/L (ref 3.5–5.1)
Sodium: 121 mmol/L — ABNORMAL LOW (ref 135–145)

## 2022-03-16 LAB — TROPONIN I (HIGH SENSITIVITY)
Troponin I (High Sensitivity): 18 ng/L — ABNORMAL HIGH (ref <18)
Troponin I (High Sensitivity): 11 ng/L (ref <18)

## 2022-03-16 LAB — SODIUM, URINE, RANDOM: Sodium, Ur: 62 mmol/L

## 2022-03-16 LAB — RESP PANEL BY RT-PCR (FLU A&B, COVID) ARPGX2
Influenza A by PCR: NEGATIVE
Influenza B by PCR: NEGATIVE
SARS Coronavirus 2 by RT PCR: NEGATIVE

## 2022-03-16 LAB — TSH: TSH: 1.194 u[IU]/mL (ref 0.350–4.500)

## 2022-03-16 LAB — OSMOLALITY: Osmolality: 249 mOsm/kg — CL (ref 275–295)

## 2022-03-16 LAB — D-DIMER, QUANTITATIVE: D-Dimer, Quant: 2.87 ug/mL-FEU — ABNORMAL HIGH (ref 0.00–0.50)

## 2022-03-16 LAB — LACTIC ACID, PLASMA: Lactic Acid, Venous: 0.7 mmol/L (ref 0.5–1.9)

## 2022-03-16 LAB — BRAIN NATRIURETIC PEPTIDE: B Natriuretic Peptide: 147.9 pg/mL — ABNORMAL HIGH (ref 0.0–100.0)

## 2022-03-16 LAB — OSMOLALITY, URINE: Osmolality, Ur: 454 mOsm/kg (ref 300–900)

## 2022-03-16 MED ORDER — VITAMIN B-12 1000 MCG PO TABS
1000.0000 ug | ORAL_TABLET | Freq: Every day | ORAL | Status: DC
  Administered 2022-03-17: 1000 ug via ORAL
  Filled 2022-03-16: qty 1

## 2022-03-16 MED ORDER — SIMVASTATIN 20 MG PO TABS
20.0000 mg | ORAL_TABLET | Freq: Every day | ORAL | Status: DC
Start: 2022-03-16 — End: 2022-03-20
  Administered 2022-03-17 – 2022-03-19 (×3): 20 mg via ORAL
  Filled 2022-03-16 (×3): qty 1

## 2022-03-16 MED ORDER — ALBUTEROL SULFATE (2.5 MG/3ML) 0.083% IN NEBU
2.5000 mg | INHALATION_SOLUTION | Freq: Once | RESPIRATORY_TRACT | Status: AC
  Administered 2022-03-16: 2.5 mg via RESPIRATORY_TRACT
  Filled 2022-03-16: qty 3

## 2022-03-16 MED ORDER — CEFTRIAXONE SODIUM 1 G IJ SOLR
1.0000 g | Freq: Once | INTRAMUSCULAR | Status: AC
  Administered 2022-03-16: 1 g via INTRAVENOUS
  Filled 2022-03-16: qty 10

## 2022-03-16 MED ORDER — PROCHLORPERAZINE EDISYLATE 10 MG/2ML IJ SOLN
10.0000 mg | Freq: Four times a day (QID) | INTRAMUSCULAR | Status: DC | PRN
  Administered 2022-03-17: 10 mg via INTRAVENOUS
  Filled 2022-03-16: qty 2

## 2022-03-16 MED ORDER — RALOXIFENE HCL 60 MG PO TABS
60.0000 mg | ORAL_TABLET | Freq: Every day | ORAL | Status: DC
  Administered 2022-03-17 – 2022-03-20 (×4): 60 mg via ORAL
  Filled 2022-03-16 (×4): qty 1

## 2022-03-16 MED ORDER — NICOTINE 14 MG/24HR TD PT24
14.0000 mg | MEDICATED_PATCH | Freq: Every day | TRANSDERMAL | Status: DC
Start: 2022-03-16 — End: 2022-03-20
  Administered 2022-03-16 – 2022-03-20 (×5): 14 mg via TRANSDERMAL
  Filled 2022-03-16 (×5): qty 1

## 2022-03-16 MED ORDER — ADULT MULTIVITAMIN W/MINERALS CH
1.0000 | ORAL_TABLET | Freq: Every day | ORAL | Status: DC
  Administered 2022-03-17 – 2022-03-20 (×4): 1 via ORAL
  Filled 2022-03-16 (×4): qty 1

## 2022-03-16 MED ORDER — PANTOPRAZOLE SODIUM 40 MG PO TBEC
40.0000 mg | DELAYED_RELEASE_TABLET | Freq: Every day | ORAL | Status: DC
  Administered 2022-03-17 – 2022-03-20 (×4): 40 mg via ORAL
  Filled 2022-03-16 (×5): qty 1

## 2022-03-16 MED ORDER — SENNOSIDES-DOCUSATE SODIUM 8.6-50 MG PO TABS
1.0000 | ORAL_TABLET | Freq: Every day | ORAL | Status: DC
  Administered 2022-03-17 – 2022-03-20 (×4): 1 via ORAL
  Filled 2022-03-16 (×4): qty 1

## 2022-03-16 MED ORDER — OYSTER SHELL CALCIUM/D3 500-5 MG-MCG PO TABS
1.0000 | ORAL_TABLET | Freq: Every day | ORAL | Status: DC
  Administered 2022-03-17 – 2022-03-20 (×4): 1 via ORAL
  Filled 2022-03-16 (×4): qty 1

## 2022-03-16 MED ORDER — POLYETHYLENE GLYCOL 3350 17 G PO PACK: 17.0000 g | PACK | Freq: Every day | ORAL | Status: DC | PRN

## 2022-03-16 MED ORDER — GUAIFENESIN ER 600 MG PO TB12
1200.0000 mg | ORAL_TABLET | Freq: Two times a day (BID) | ORAL | Status: AC
  Administered 2022-03-16 – 2022-03-19 (×6): 1200 mg via ORAL
  Filled 2022-03-16 (×6): qty 2

## 2022-03-16 MED ORDER — METHYLPREDNISOLONE SODIUM SUCC 40 MG IJ SOLR
40.0000 mg | Freq: Two times a day (BID) | INTRAMUSCULAR | Status: DC
  Administered 2022-03-17 – 2022-03-18 (×3): 40 mg via INTRAVENOUS
  Filled 2022-03-16 (×3): qty 1

## 2022-03-16 MED ORDER — UMECLIDINIUM BROMIDE 62.5 MCG/ACT IN AEPB
1.0000 | INHALATION_SPRAY | Freq: Every day | RESPIRATORY_TRACT | Status: DC
  Administered 2022-03-17 – 2022-03-20 (×4): 1 via RESPIRATORY_TRACT
  Filled 2022-03-16: qty 7

## 2022-03-16 MED ORDER — MELATONIN 3 MG PO TABS
3.0000 mg | ORAL_TABLET | Freq: Every evening | ORAL | Status: DC | PRN
  Administered 2022-03-18 – 2022-03-19 (×2): 3 mg via ORAL
  Filled 2022-03-16 (×2): qty 1

## 2022-03-16 MED ORDER — AMLODIPINE BESYLATE 5 MG PO TABS
5.0000 mg | ORAL_TABLET | Freq: Every day | ORAL | Status: DC
Start: 2022-03-17 — End: 2022-03-20
  Administered 2022-03-17 – 2022-03-20 (×4): 5 mg via ORAL
  Filled 2022-03-16 (×4): qty 1

## 2022-03-16 MED ORDER — IOHEXOL 350 MG/ML SOLN
100.0000 mL | Freq: Once | INTRAVENOUS | Status: AC | PRN
  Administered 2022-03-16: 100 mL via INTRAVENOUS

## 2022-03-16 MED ORDER — GUAIFENESIN-DM 100-10 MG/5ML PO SYRP: 5.0000 mL | ORAL_SOLUTION | ORAL | Status: DC | PRN

## 2022-03-16 MED ORDER — ESCITALOPRAM OXALATE 20 MG PO TABS
20.0000 mg | ORAL_TABLET | Freq: Every day | ORAL | Status: DC
  Administered 2022-03-17 – 2022-03-20 (×4): 20 mg via ORAL
  Filled 2022-03-16: qty 1
  Filled 2022-03-16: qty 2
  Filled 2022-03-16 (×2): qty 1

## 2022-03-16 MED ORDER — SODIUM CHLORIDE 3 % IN NEBU
4.0000 mL | INHALATION_SOLUTION | Freq: Two times a day (BID) | RESPIRATORY_TRACT | Status: AC
  Administered 2022-03-16 – 2022-03-19 (×5): 4 mL via RESPIRATORY_TRACT
  Filled 2022-03-16 (×7): qty 4

## 2022-03-16 MED ORDER — SODIUM CHLORIDE 0.9 % IV SOLN: INTRAVENOUS | Status: DC

## 2022-03-16 MED ORDER — SODIUM CHLORIDE 0.9 % IV SOLN
500.0000 mg | Freq: Once | INTRAVENOUS | Status: AC
  Administered 2022-03-16: 500 mg via INTRAVENOUS
  Filled 2022-03-16: qty 5

## 2022-03-16 MED ORDER — SODIUM CHLORIDE 0.9 % IV BOLUS
500.0000 mL | Freq: Once | INTRAVENOUS | Status: AC
  Administered 2022-03-16: 500 mL via INTRAVENOUS

## 2022-03-16 MED ORDER — METHYLPREDNISOLONE SODIUM SUCC 125 MG IJ SOLR
125.0000 mg | Freq: Once | INTRAMUSCULAR | Status: AC
  Administered 2022-03-16: 125 mg via INTRAVENOUS
  Filled 2022-03-16: qty 2

## 2022-03-16 MED ORDER — IPRATROPIUM-ALBUTEROL 0.5-2.5 (3) MG/3ML IN SOLN
3.0000 mL | Freq: Once | RESPIRATORY_TRACT | Status: AC
  Administered 2022-03-16: 3 mL via RESPIRATORY_TRACT
  Filled 2022-03-16: qty 3

## 2022-03-16 MED ORDER — IPRATROPIUM-ALBUTEROL 0.5-2.5 (3) MG/3ML IN SOLN
3.0000 mL | Freq: Four times a day (QID) | RESPIRATORY_TRACT | Status: DC
Start: 2022-03-17 — End: 2022-03-17
  Administered 2022-03-17 (×3): 3 mL via RESPIRATORY_TRACT
  Filled 2022-03-16 (×3): qty 3

## 2022-03-16 MED ORDER — SODIUM CHLORIDE 0.9 % IV SOLN
500.0000 mg | INTRAVENOUS | Status: DC
  Administered 2022-03-17: 500 mg via INTRAVENOUS
  Filled 2022-03-16: qty 5

## 2022-03-16 MED ORDER — ACETAMINOPHEN 325 MG PO TABS: 650.0000 mg | ORAL_TABLET | Freq: Four times a day (QID) | ORAL | Status: DC | PRN

## 2022-03-16 MED ORDER — ALPRAZOLAM 0.5 MG PO TABS
0.2500 mg | ORAL_TABLET | Freq: Two times a day (BID) | ORAL | Status: DC | PRN
  Administered 2022-03-18 – 2022-03-20 (×3): 0.25 mg via ORAL
  Filled 2022-03-16 (×3): qty 1

## 2022-03-16 MED ORDER — SODIUM CHLORIDE 0.9 % IV SOLN
2.0000 g | INTRAVENOUS | Status: DC
  Administered 2022-03-17 – 2022-03-18 (×2): 2 g via INTRAVENOUS
  Filled 2022-03-16 (×2): qty 20

## 2022-03-16 MED ORDER — ENOXAPARIN SODIUM 40 MG/0.4ML IJ SOSY
40.0000 mg | PREFILLED_SYRINGE | INTRAMUSCULAR | Status: DC
  Administered 2022-03-16 – 2022-03-19 (×4): 40 mg via SUBCUTANEOUS
  Filled 2022-03-16 (×4): qty 0.4

## 2022-03-16 NOTE — ED Provider Notes (Signed)
?[place] ?Provider Note ? ? ?CSN: [id number] ?Arrival date & time: 03/16/22  1758 ? ?  ? ?History ? ?Chief Complaint  ?Patient presents with  ? Shortness of Breath  ? ? ?Carol [name] is a 86 y.o. female. ? ?HPI ?86 year old female presents with shortness of breath and hypoxia.  She was found to be 86% on room air by EMS.  History is somewhat from the patient but a lot from the daughters at the bedside.  They note that the patient has been progressively getting worse from a shortness of breath and chronic cough perspective over the last couple months.  Over several weeks she has been having a lot of burping and regurgitating.  Today she was unable to keep anything down as she was heaving.  She has had low-grade temperatures up to 99.6 and 3 days ago she was put on doxycycline and then changed to something else (Levaquin?)  After urine culture came back positive.  She has been having urinary tract infection symptoms.  She has a chronic cough with a white sputum but it seems like her shortness of breath is getting worse.  There is no chest pain.  She has had chronic leg swelling to both lower extremities.  She currently smokes.  She does not wear oxygen at home. ? ?Home Medications ?Prior to Admission medications   ?Medication Sig Start Date End Date Taking? Authorizing Provider  ?acetaminophen (TYLENOL) 650 MG CR tablet Take 1,300 mg by mouth daily as needed for pain.    Yes [provider]  ?Calcium Carbonate-Vit D-Min (CALTRATE 600+D PLUS PO) Take 1 tablet by mouth daily.   Yes [provider]  ?cephALEXin (KEFLEX) 500 MG capsule Take 500 mg by mouth 2 (two) times daily. 03/15/22  Yes [provider]  ?escitalopram (LEXAPRO) 10 MG tablet Take 10 mg by mouth at bedtime.    Yes [provider]  ?LORazepam (ATIVAN) 0.5 MG tablet Take 0.5 mg by mouth 2 (two) times daily as needed for anxiety. 03/13/22  Yes [provider]   ?MAGNESIUM CARBONATE PO Take 1 tablet by mouth daily.   Yes [provider]  ?Multiple Vitamin (MULTIVITAMIN WITH MINERALS) TABS tablet Take 1 tablet by mouth daily.   Yes [provider]  ?pantoprazole (PROTONIX) 40 MG tablet Take 40 mg by mouth daily. 10/28/17  Yes [provider]  ?polycarbophil (FIBERCON) 625 MG tablet Take 625 mg by mouth daily.   Yes [provider]  ?Probiotic Product (ALIGN PO) Take 1 tablet by mouth daily.   Yes [provider]  ?raloxifene (EVISTA) 60 MG tablet Take 60 mg by mouth daily. 12/30/17  Yes [provider]  ?senna-docusate (SENOKOT-S) 8.6-50 MG tablet Take 1 tablet by mouth daily. 04/28/19  Yes [name], MD  ?simvastatin (ZOCOR) 20 MG tablet Take 20 mg by mouth at bedtime.    Yes [provider]  ?sucralfate (CARAFATE) 1 g tablet Take 1 g by mouth 3 (three) times daily as needed. 03/13/22  Yes [provider]  ?ALPRAZolam Duanne Moron) 0.25 MG tablet Take 0.25 mg by mouth 2 (two) times daily as needed for anxiety. ?Patient not taking: Reported on 03/17/2022    [provider]  ?amLODipine (NORVASC) 5 MG tablet Take 5 mg by mouth daily. ?Patient not taking: Reported on 03/17/2022 11/14/17   [provider]  ?doxycycline (VIBRA-TABS) 100 MG tablet Take 100 mg by mouth 2 (two) times daily. ?Patient not taking:  Reported on 03/17/2022 03/13/22   [provider]  ?Fluticasone-Salmeterol (ADVAIR DISKUS) 250-50 MCG/DOSE AEPB Inhale 1 puff into the lungs 2 (two) times a day. ?Patient not taking: Reported on 03/17/2022 04/27/19   [name], MD  ?nicotine (NICODERM CQ - DOSED IN MG/24 HOURS) 21 mg/24hr patch Place 1 patch (21 mg total) onto the skin daily. ?Patient not taking: Reported on 03/17/2022 04/27/19   [name], MD  ?ondansetron (ZOFRAN ODT) 4 MG disintegrating tablet Take 1 tablet (4 mg total) by mouth every 8 (eight) hours as needed for nausea or vomiting. ?Patient not  taking: Reported on 01/23/2018 01/06/18   [name], [name], MD  ?umeclidinium bromide (INCRUSE ELLIPTA) 62.5 MCG/INH AEPB Inhale 1 puff into the lungs daily. ?Patient not taking: Reported on 03/17/2022 04/28/19   [name], MD  ?vitamin B-12 (CYANOCOBALAMIN) 1000 MCG tablet Take 1 tablet (1,000 mcg total) by mouth daily. ?Patient not taking: Reported on 03/17/2022 04/27/19   [name], MD  ?   ? ?Allergies    ?Ciprofloxacin, Nitrofuran derivatives, and Sulfa antibiotics   ? ?Review of Systems   ?Review of Systems  ?Constitutional:  Positive for fever.  ?Respiratory:  Positive for cough and shortness of breath.   ?Cardiovascular:  Positive for leg swelling. Negative for chest pain.  ?Gastrointestinal:  Positive for nausea and vomiting. Negative for abdominal pain.  ?Genitourinary:  Positive for dysuria.  ? ?Physical Exam ?Updated Vital Signs ?BP (!) 149/68   Pulse 87   Temp 97.8 ?F (36.6 ?C) (Oral)   Resp (!) 23   Ht '5\' 1"'$  (1.549 m)   Wt 47.7 kg   SpO2 92%   BMI 19.87 kg/m?  ?Physical Exam ?Vitals and nursing note reviewed.  ?Constitutional:   ?   Appearance: She is well-developed.  ?HENT:  ?   Head: Normocephalic and atraumatic.  ?Cardiovascular:  ?   Rate and Rhythm: Normal rate and regular rhythm.  ?   Heart sounds: Normal heart sounds.  ?Pulmonary:  ?   Effort: Pulmonary effort is normal. Tachypnea present. No bradypnea, accessory muscle usage or respiratory distress.  ?   Breath sounds: Decreased breath sounds and wheezing present.  ?Abdominal:  ?   Palpations: Abdomen is soft.  ?   Tenderness: There is no abdominal tenderness.  ?Musculoskeletal:  ?   Right lower leg: No edema.  ?   Left lower leg: Edema present.  ?   Comments: Pitting edema to bilateral ankles and feet as well as a little bit into the lower legs  ?Skin: ?   General: Skin is warm and dry.  ?Neurological:  ?   Mental Status: She is alert.  ? ? ?ED Results / Procedures / Treatments   ?Labs ?(all labs ordered are listed, but only  abnormal results are displayed) ?Labs Reviewed  ?COMPREHENSIVE METABOLIC PANEL - Abnormal; Notable for the following components:  ?    Result Value  ? Sodium 119 (*)   ? Chloride 80 (*)   ? Creatinine, Ser 0.39 (*)   ? Calcium 8.6 (*)   ? Albumin 2.6 (*)   ? All other components within normal limits  ?URINALYSIS, ROUTINE W REFLEX MICROSCOPIC - Abnormal; Notable for the following components:  ? Ketones, ur 20 (*)   ? Bacteria, UA RARE (*)   ? All other components within normal limits  ?BRAIN NATRIURETIC PEPTIDE - Abnormal; Notable for the following components:  ? B Natriuretic Peptide 147.9 (*)   ?  All other components within normal limits  ?CBC WITH DIFFERENTIAL/PLATELET - Abnormal; Notable for the following components:  ? Platelets 413 (*)   ? All other components within normal limits  ?D-DIMER, QUANTITATIVE - Abnormal; Notable for the following components:  ? D-Dimer, Quant 2.87 (*)   ? All other components within normal limits  ?BASIC METABOLIC PANEL - Abnormal; Notable for the following components:  ? Sodium 121 (*)   ? Chloride 82 (*)   ? Glucose, Bld 102 (*)   ? Creatinine, Ser 0.41 (*)   ? Calcium 8.2 (*)   ? All other components within normal limits  ?OSMOLALITY - Abnormal; Notable for the following components:  ? Osmolality 249 (*)   ? All other components within normal limits  ?TROPONIN I (HIGH SENSITIVITY) - Abnormal; Notable for the following components:  ? Troponin I (High Sensitivity) 18 (*)   ? All other components within normal limits  ?RESP PANEL BY RT-PCR (FLU A&B, COVID) [id number]  ?CULTURE, BLOOD (ROUTINE X 2)  ?CULTURE, BLOOD (ROUTINE X 2)  ?LACTIC ACID, PLASMA  ?TSH  ?SODIUM, URINE, RANDOM  ?OSMOLALITY, URINE  ?BASIC METABOLIC PANEL  ?BASIC METABOLIC PANEL  ?COMPREHENSIVE METABOLIC PANEL  ?CBC WITH DIFFERENTIAL/PLATELET  ?MAGNESIUM  ?PHOSPHORUS  ?PROCALCITONIN  ?BASIC METABOLIC PANEL  ?TROPONIN I (HIGH SENSITIVITY)  ? ? ?EKG ?EKG Interpretation ? ?Date/Time:  Friday March 16 2022 23:33:31  EDT ?Ventricular Rate:  88 ?PR Interval:  166 ?QRS Duration: 87 ?QT Interval:  386 ?QTC Calculation: 467 ?R Axis:   103 ?Text Interpretation: Sinus rhythm Anterior infarct, old Confirmed by Sherwood Gambler [phone]) on 03/17/2022 12:33

## 2022-03-16 NOTE — ED Notes (Signed)
Date and time results received: 03/16/22 2010 ?(use smartphrase ".now" to insert current time) ? ?Test: Sodium ?Critical Value: 119 ? ?Name of Provider Notified: Dr Regenia Skeeter ? ?

## 2022-03-16 NOTE — ED Notes (Signed)
Critical lab result reported by lab: Serum Osmolality 249. Provider, [place] Crane, made aware. ?

## 2022-03-16 NOTE — H&P (Addendum)
?History and Physical ? ?Carol A Colocho ERD:[id number] DOB: 1936-10-23 DOA: 03/16/2022 ? ?Referring physician: Dr. Regenia Skeeter, [place]  ?PCP: Shirline Frees, MD  ?Outpatient Specialists: ENT ?Patient coming from: Home ? ?Chief Complaint: Worsening shortness of breath, productive cough with white sputum. ? ?HPI: Carol [name] is a 86 y.o. female with medical history significant for chronic anxiety/depression, GERD, hyperlipidemia, hypertension, current tobacco use, less than 1 pack/day, who presented to [facility] ED due to gradually worsening shortness of breath of 3 to 4-day duration.  Associated with productive cough with white sputum.  Afebrile.  No chest pain.  No nausea.  Recently evaluated by her primary care provider and diagnosed with a urinary tract infection, was started on doxycycline on 03/13/22, then was switched to [place] on 03/15/2022.  Patient continues to smoke about less than a pack per day.  Not on oxygen supplementation at baseline.  EMS was activated.  Upon arrival, the patient was hypoxic with oxygen saturation 86% on ambient air, placed on 5 L nasal cannula.  In the ED, the patient has conversational dyspnea, on 3 L nasal cannula with O2 saturation of 93%, she is mildly volume overload with trace lower extremity edema, with mild rales noted at bases on lungs auscultation.  Chest x-ray revealed bibasilar infiltrates versus atelectasis.  She was started on treatment for COPD exacerbation.  Patient was admitted by the hospitalist service, [facility]. ? ?ED Course: Tmax 97.8.  BP 150/56, pulse 85, respiratory 23, saturation 93% on 3 L.  Lab studies remarkable for serum sodium 119, glucose 98, creatinine 0.39, BNP 147, lactic acid 0.7.  Troponin 11.  D-dimer 2.87.  Platelet count 413 rest of CBC unremarkable. ? ?Review of Systems: ?Review of systems as noted in the HPI. All other systems reviewed and are negative. ? ? ?Past Medical History:  ?Diagnosis Date  ? Anxiety   ? Arthritis   ? arthritis -hips  ?  Colitis   ? Complication of anesthesia   ? B/P dropped post colonoscopy x1  ? GERD (gastroesophageal reflux disease)   ? Headache(784.0)   ? migraines rare occ.  ? Hypertension   ? Urinary tract infection early aug 2014  ? ?Past Surgical History:  ?Procedure Laterality Date  ? CATARACT EXTRACTION, BILATERAL Bilateral   ? COLONOSCOPY WITH PROPOFOL N/A 08/03/2013  ? Procedure: COLONOSCOPY WITH PROPOFOL;  Surgeon: Winfield Cunas., MD;  Location: [facility] ENDOSCOPY;  Service: Endoscopy;  Laterality: N/A;  ultra thin colon scope  ? COLONOSCOPY WITH PROPOFOL N/A 02/04/2018  ? Procedure: COLONOSCOPY WITH PROPOFOL;  Surgeon: Laurence Spates, MD;  Location: [facility] ENDOSCOPY;  Service: Endoscopy;  Laterality: N/A;  ? LASIK    ? ? ?Social History:  reports that she has been smoking cigarettes. She has a 40.00 pack-year smoking history. She has never used smokeless tobacco. She reports that she does not drink alcohol and does not use drugs. ? ? ?Allergies  ?Allergen Reactions  ? Ciprofloxacin Nausea Only and Other (See Comments)  ?  Nausea and legs weak  ? Nitrofuran Derivatives Nausea Only and Other (See Comments)  ?  nitrofuratonin bad nausea, legs weak  ? Sulfa Antibiotics Other (See Comments)  ?  jittery  ? ? ?Family history: ?Denies family history of bleeding disorder, wound healing problems or difficulty with anesthesia. ? ?Prior to Admission medications   ?Medication Sig Start Date End Date Taking? Authorizing Provider  ?acetaminophen (TYLENOL) 650 MG CR tablet Take 1,300 mg by mouth daily as needed for pain.  [provider]  ?ALPRAZolam Duanne Moron) 0.25 MG tablet Take 0.25 mg by mouth 2 (two) times daily as needed for anxiety.    [provider]  ?amLODipine (NORVASC) 5 MG tablet Take 5 mg by mouth daily. 11/14/17   [provider]  ?Calcium Carbonate-Vit D-Min (CALTRATE 600+D PLUS PO) Take 1 tablet by mouth daily.    [provider]  ?escitalopram (LEXAPRO) 10 MG tablet Take 10 mg by mouth at  bedtime.     [provider]  ?Fluticasone-Salmeterol (ADVAIR DISKUS) 250-50 MCG/DOSE AEPB Inhale 1 puff into the lungs 2 (two) times a day. 04/27/19   [name], MD  ?MAGNESIUM CARBONATE PO Take 1 tablet by mouth daily.    [provider]  ?Multiple Vitamin (MULTIVITAMIN WITH MINERALS) TABS tablet Take 1 tablet by mouth daily.    [provider]  ?nicotine (NICODERM CQ - DOSED IN MG/24 HOURS) 21 mg/24hr patch Place 1 patch (21 mg total) onto the skin daily. 04/27/19   [name], MD  ?ondansetron (ZOFRAN ODT) 4 MG disintegrating tablet Take 1 tablet (4 mg total) by mouth every 8 (eight) hours as needed for nausea or vomiting. ?Patient not taking: Reported on 01/23/2018 01/06/18   [name], [name], MD  ?pantoprazole (PROTONIX) 40 MG tablet Take 40 mg by mouth daily. 10/28/17   [provider]  ?polycarbophil (FIBERCON) 625 MG tablet Take 625 mg by mouth daily.    [provider]  ?Probiotic Product (ALIGN PO) Take 1 tablet by mouth daily.    [provider]  ?raloxifene (EVISTA) 60 MG tablet Take 60 mg by mouth daily. 12/30/17   [provider]  ?senna-docusate (SENOKOT-S) 8.6-50 MG tablet Take 1 tablet by mouth daily. 04/28/19   [name], MD  ?simvastatin (ZOCOR) 20 MG tablet Take 20 mg by mouth at bedtime.     [provider]  ?umeclidinium bromide (INCRUSE ELLIPTA) 62.5 MCG/INH AEPB Inhale 1 puff into the lungs daily. 04/28/19   [name], MD  ?vitamin B-12 (CYANOCOBALAMIN) 1000 MCG tablet Take 1 tablet (1,000 mcg total) by mouth daily. 04/27/19   [name], MD  ? ? ?Physical Exam: ?BP (!) 150/56 (BP Location: Right Arm)   Pulse 85   Temp 97.8 ?F (36.6 ?C) (Oral)   Resp (!) 23   Ht '5\' 1"'$  (1.549 m)   Wt 47.7 kg   SpO2 93%   BMI 19.87 kg/m?  ? ?General: 86 y.o. year-old female well developed well nourished in no acute distress.  Alert and oriented x3. ?Cardiovascular: Tachycardic with no rubs or gallops.  No  thyromegaly or JVD noted.  Trace lower extremity edema.  ?Respiratory: Mild rales at bases.  Diffuse wheezing noted.  Poor inspiratory effort. ?Abdomen: Soft nontender nondistended with normal bowel sounds x4 quadrants. ?Muskuloskeletal: No cyanosis or clubbing.  Trace edema noted bilaterally in lower extremities. ?Neuro: CN II-XII intact, strength, sensation, reflexes ?Skin: No ulcerative lesions noted or rashes ?Psychiatry: Judgement and insight appear normal. Mood is appropriate for condition and setting ?   ?   ?   ?Labs on Admission:  ?Basic Metabolic Panel: ?Recent Labs  ?Lab 03/16/22 ?1555 03/16/22 ?1810  ?NA 121* 119*  ?K 3.8 4.0  ?CL 82* 80*  ?CO2 26 26  ?GLUCOSE 102* 98  ?BUN 8 9  ?CREATININE 0.41* 0.39*  ?CALCIUM 8.2* 8.6*  ? ?Liver Function Tests: ?Recent Labs  ?Lab 03/16/22 ?1810  ?AST 23  ?ALT 16  ?ALKPHOS 69  ?  BILITOT 0.9  ?PROT 6.6  ?ALBUMIN 2.6*  ? ?No results for input(s): LIPASE, AMYLASE in the last 168 hours. ?No results for input(s): AMMONIA in the last 168 hours. ?CBC: ?Recent Labs  ?Lab 03/16/22 ?1810  ?WBC 8.3  ?NEUTROABS 6.1  ?HGB 13.8  ?HCT 39.1  ?MCV 95.8  ?PLT 413*  ? ?Cardiac Enzymes: ?No results for input(s): CKTOTAL, CKMB, CKMBINDEX, TROPONINI in the last 168 hours. ? ?BNP (last 3 results) ?Recent Labs  ?  03/16/22 ?1810  ?BNP 147.9*  ? ? ?ProBNP (last 3 results) ?No results for input(s): PROBNP in the last 8760 hours. ? ?CBG: ?No results for input(s): GLUCAP in the last 168 hours. ? ?Radiological Exams on Admission: ?DG Chest Portable 1 View ? ?Result Date: 03/16/2022 ?CLINICAL DATA:  Provided history: Hypoxia, cough. EXAM: PORTABLE CHEST 1 VIEW COMPARISON:  Prior chest radiographs 04/24/2019 and earlier. CT chest 04/24/2019. FINDINGS: Heart size at the upper limits of normal. Aortic atherosclerosis. Chronic prominence of the bilateral interstitial lung markings. Superimposed interstitial and ill-defined opacities within the periphery of the right mid lung field and within the  bilateral lung bases. Small right pleural effusion. No evidence of pneumothorax. No acute bony abnormality identified. IMPRESSION: Interstitial and ill-defined opacities within the periphery of the right mid lung and with

## 2022-03-16 NOTE — ED Triage Notes (Signed)
Pt BIB EMS from home initially for Uti that had been treated w/abx for 5 days and was having no relief. When EMS arrived pt was sating at 86% on room air, ems pt her on 5L and reports bilateral crackles  ?

## 2022-03-17 DIAGNOSIS — Z720 Tobacco use: Secondary | ICD-10-CM

## 2022-03-17 DIAGNOSIS — E871 Hypo-osmolality and hyponatremia: Secondary | ICD-10-CM

## 2022-03-17 DIAGNOSIS — R9389 Abnormal findings on diagnostic imaging of other specified body structures: Secondary | ICD-10-CM

## 2022-03-17 DIAGNOSIS — J9601 Acute respiratory failure with hypoxia: Secondary | ICD-10-CM | POA: Diagnosis not present

## 2022-03-17 DIAGNOSIS — J441 Chronic obstructive pulmonary disease with (acute) exacerbation: Secondary | ICD-10-CM | POA: Diagnosis not present

## 2022-03-17 LAB — COMPREHENSIVE METABOLIC PANEL
ALT: 14 U/L (ref 0–44)
AST: 15 U/L (ref 15–41)
Albumin: 2.2 g/dL — ABNORMAL LOW (ref 3.5–5.0)
Alkaline Phosphatase: 58 U/L (ref 38–126)
Anion gap: 11 (ref 5–15)
BUN: 8 mg/dL (ref 8–23)
CO2: 27 mmol/L (ref 22–32)
Calcium: 8.4 mg/dL — ABNORMAL LOW (ref 8.9–10.3)
Chloride: 84 mmol/L — ABNORMAL LOW (ref 98–111)
Creatinine, Ser: 0.5 mg/dL (ref 0.44–1.00)
GFR, Estimated: >60 mL/min (ref >60)
Glucose, Bld: 103 mg/dL — ABNORMAL HIGH (ref 70–99)
Potassium: 3.9 mmol/L (ref 3.5–5.1)
Sodium: 122 mmol/L — ABNORMAL LOW (ref 135–145)
Total Bilirubin: 0.8 mg/dL (ref 0.3–1.2)
Total Protein: 5.8 g/dL — ABNORMAL LOW (ref 6.5–8.1)

## 2022-03-17 LAB — BASIC METABOLIC PANEL
Anion gap: 12 (ref 5–15)
Anion gap: 10 (ref 5–15)
BUN: 9 mg/dL (ref 8–23)
BUN: 9 mg/dL (ref 8–23)
CO2: 26 mmol/L (ref 22–32)
CO2: 28 mmol/L (ref 22–32)
Calcium: 8.9 mg/dL (ref 8.9–10.3)
Calcium: 8.9 mg/dL (ref 8.9–10.3)
Chloride: 83 mmol/L — ABNORMAL LOW (ref 98–111)
Chloride: 89 mmol/L — ABNORMAL LOW (ref 98–111)
Creatinine, Ser: 0.48 mg/dL (ref 0.44–1.00)
Creatinine, Ser: 0.53 mg/dL (ref 0.44–1.00)
GFR, Estimated: >60 mL/min (ref >60)
GFR, Estimated: >60 mL/min (ref >60)
Glucose, Bld: 98 mg/dL (ref 70–99)
Glucose, Bld: 113 mg/dL — ABNORMAL HIGH (ref 70–99)
Potassium: 4.1 mmol/L (ref 3.5–5.1)
Potassium: 3.9 mmol/L (ref 3.5–5.1)
Sodium: 121 mmol/L — ABNORMAL LOW (ref 135–145)
Sodium: 127 mmol/L — ABNORMAL LOW (ref 135–145)

## 2022-03-17 LAB — PHOSPHORUS: Phosphorus: 4.5 mg/dL (ref 2.5–4.6)

## 2022-03-17 LAB — CBC WITH DIFFERENTIAL/PLATELET
Abs Immature Granulocytes: 0.06 10*3/uL (ref 0.00–0.07)
Basophils Absolute: 0 10*3/uL (ref 0.0–0.1)
Basophils Relative: 0 %
Eosinophils Absolute: 0 10*3/uL (ref 0.0–0.5)
Eosinophils Relative: 0 %
HCT: 35.8 % — ABNORMAL LOW (ref 36.0–46.0)
Hemoglobin: 12.6 g/dL (ref 12.0–15.0)
Immature Granulocytes: 1 %
Lymphocytes Relative: 10 %
Lymphs Abs: 0.5 10*3/uL — ABNORMAL LOW (ref 0.7–4.0)
MCH: 33.8 pg (ref 26.0–34.0)
MCHC: 35.2 g/dL (ref 30.0–36.0)
MCV: 96 fL (ref 80.0–100.0)
Monocytes Absolute: 0.1 10*3/uL (ref 0.1–1.0)
Monocytes Relative: 1 %
Neutro Abs: 4.4 10*3/uL (ref 1.7–7.7)
Neutrophils Relative %: 88 %
Platelets: 388 10*3/uL (ref 150–400)
RBC: 3.73 MIL/uL — ABNORMAL LOW (ref 3.87–5.11)
RDW: 13.7 % (ref 11.5–15.5)
WBC: 5.1 10*3/uL (ref 4.0–10.5)
nRBC: 0 % (ref 0.0–0.2)

## 2022-03-17 LAB — MAGNESIUM: Magnesium: 1.8 mg/dL (ref 1.7–2.4)

## 2022-03-17 LAB — PROCALCITONIN: Procalcitonin: 0.12 ng/mL

## 2022-03-17 MED ORDER — SODIUM CHLORIDE 0.9 % IV SOLN: INTRAVENOUS | Status: DC

## 2022-03-17 MED ORDER — IPRATROPIUM-ALBUTEROL 0.5-2.5 (3) MG/3ML IN SOLN
3.0000 mL | Freq: Three times a day (TID) | RESPIRATORY_TRACT | Status: DC
Start: 2022-03-18 — End: 2022-03-19
  Administered 2022-03-18 – 2022-03-19 (×4): 3 mL via RESPIRATORY_TRACT
  Filled 2022-03-17 (×4): qty 3

## 2022-03-17 MED ORDER — ORAL CARE MOUTH RINSE
15.0000 mL | Freq: Two times a day (BID) | OROMUCOSAL | Status: DC
  Administered 2022-03-18 – 2022-03-20 (×3): 15 mL via OROMUCOSAL

## 2022-03-17 MED ORDER — ENSURE ENLIVE PO LIQD: 237.0000 mL | Freq: Two times a day (BID) | ORAL | Status: DC

## 2022-03-17 MED ORDER — ALBUTEROL SULFATE (2.5 MG/3ML) 0.083% IN NEBU: 2.5000 mg | INHALATION_SOLUTION | RESPIRATORY_TRACT | Status: DC | PRN

## 2022-03-17 MED ORDER — SODIUM CHLORIDE 1 G PO TABS
1.0000 g | ORAL_TABLET | Freq: Two times a day (BID) | ORAL | Status: DC
  Administered 2022-03-17: 1 g via ORAL
  Filled 2022-03-17: qty 1

## 2022-03-17 NOTE — ED Notes (Signed)
Pt given incentive spirometer and instruction given to pt and pt able to teach back to this nurse. ?

## 2022-03-17 NOTE — ED Notes (Signed)
ED TO INPATIENT HANDOFF REPORT ? ?ED Nurse Name and Phone #: Baxter Flattery, RN ? ?S ?Name/Age/Gender ?Carol [name] ?85 y.o. ?female ?Room/Bed: 037C/037C ? ?Code Status ?  Code Status: Full Code ? ?Home/SNF/Other ?Home ?Patient oriented to: self, place, time, and situation ?Is this baseline? Yes  ? ?Triage Complete: Triage complete  ?Chief Complaint ?COPD exacerbation ([place]) [J44.1] ? ?Triage Note ?Pt BIB EMS from home initially for Uti that had been treated w/abx for 5 days and was having no relief. When EMS arrived pt was sating at 86% on room air, ems pt her on 5L and reports bilateral crackles   ? ?Allergies ?Allergies  ?Allergen Reactions  ? Ciprofloxacin Nausea Only and Other (See Comments)  ?  Nausea and legs weak  ? Nitrofuran Derivatives Nausea Only and Other (See Comments)  ?  nitrofuratonin bad nausea, legs weak  ? Sulfa Antibiotics Other (See Comments)  ?  jittery  ? ? ?Level of Care/Admitting Diagnosis ?ED Disposition   ? ? ED Disposition  ?Admit  ? Condition  ?--  ? Comment  ?Hospital Area: [facility] [[id number]] ? Level of Care: Telemetry Medical [104] ? May admit patient to [facility] or [facility] if equivalent level of care is available:: Yes ? Covid Evaluation: Asymptomatic - no recent exposure (last 10 days) testing not required ? Diagnosis: COPD exacerbation ([place]) [045409] ? Admitting Physician: Kayleen Memos [[id number]] ? Attending Physician: Kayleen Memos [[id number]] ? Estimated length of stay: past midnight tomorrow ? Certification:: I certify this patient will need inpatient services for at least 2 midnights ?  ?  ? ?  ? ? ?B ?Medical/Surgery History ?Past Medical History:  ?Diagnosis Date  ? Anxiety   ? Arthritis   ? arthritis -hips  ? Colitis   ? Complication of anesthesia   ? B/P dropped post colonoscopy x1  ? GERD (gastroesophageal reflux disease)   ? Headache(784.0)   ? migraines rare occ.  ? Hypertension   ? Urinary tract infection early aug 2014  ? ?Past Surgical History:   ?Procedure Laterality Date  ? CATARACT EXTRACTION, BILATERAL Bilateral   ? COLONOSCOPY WITH PROPOFOL N/A 08/03/2013  ? Procedure: COLONOSCOPY WITH PROPOFOL;  Surgeon: Winfield Cunas., MD;  Location: [facility];  Service: Endoscopy;  Laterality: N/A;  ultra thin colon scope  ? COLONOSCOPY WITH PROPOFOL N/A 02/04/2018  ? Procedure: COLONOSCOPY WITH PROPOFOL;  Surgeon: Laurence Spates, MD;  Location: [facility];  Service: Endoscopy;  Laterality: N/A;  ? LASIK    ?  ? ?A ?IV Location/Drains/Wounds ?Patient Lines/Drains/Airways Status   ? ? Active Line/Drains/Airways   ? ? Name Placement date Placement time Site Days  ? Peripheral IV 03/16/22 20 G Anterior;Left Forearm 03/16/22  1816  Forearm  1  ? Peripheral IV 03/16/22 20 G 1" Anterior;Distal;Right Forearm 03/16/22  2030  Forearm  1  ? External Urinary Catheter 03/17/22  1224  --  less than 1  ? ?  ?  ? ?  ? ? ?Intake/Output Last 24 hours ? ?Intake/Output Summary (Last 24 hours) at 03/17/2022 1432 ?Last data filed at 03/17/2022 1124 ?Gross per 24 hour  ?Intake 405 ml  ?Output --  ?Net 405 ml  ? ? ?Labs/Imaging ?Results for orders placed or performed during the hospital encounter of 03/16/22 (from the past 48 hour(s))  ?D-dimer, quantitative     Status: Abnormal  ? Collection Time: 03/16/22  3:55 PM  ?Result Value Ref Range  ? D-Dimer, America Brown  2.87 (H) 0.00 - 0.50 ug/mL-FEU  ?  Comment: (NOTE) ?At the manufacturer cut-off value of 0.5 ?g/mL FEU, this assay has a ?negative predictive value of 95-100%.This assay is intended for use ?in conjunction with a clinical pretest probability (PTP) assessment ?model to exclude pulmonary embolism (PE) and deep venous thrombosis ?(DVT) in outpatients suspected of PE or DVT. ?Results should be correlated with clinical presentation. ?Performed at [facility], [place] [address], [place], [place] ?[address] ?  ?Basic metabolic panel     Status: Abnormal  ? Collection Time: 03/16/22  3:55 PM  ?Result Value Ref Range  ? Sodium 121 (L)  135 - 145 mmol/L  ? Potassium 3.8 3.5 - 5.1 mmol/L  ? Chloride 82 (L) 98 - 111 mmol/L  ? CO2 26 22 - 32 mmol/L  ? Glucose, Bld 102 (H) 70 - 99 mg/dL  ?  Comment: Glucose reference range applies only to samples taken after fasting for at least 8 hours.  ? BUN 8 8 - 23 mg/dL  ? Creatinine, Ser 0.41 (L) 0.44 - 1.00 mg/dL  ? Calcium 8.2 (L) 8.9 - 10.3 mg/dL  ? GFR, Estimated >60 >60 mL/min  ?  Comment: (NOTE) ?Calculated using the CKD-EPI Creatinine Equation (2021) ?  ? Anion gap 13 5 - 15  ?  Comment: Performed at [facility], [place] [address] [place], [place] [address]  ?Osmolality     Status: Abnormal  ? Collection Time: 03/16/22  3:55 PM  ?Result Value Ref Range  ? Osmolality 249 (LL) 275 - 295 mOsm/kg  ?  Comment: CRITICAL RESULT CALLED TO, READ BACK BY AND VERIFIED WITH: ?RN, Dionisio Paschal [id number] @ [place] ?Performed at [facility], [place] [address], [place], [place] [address] ?  ?Comprehensive metabolic panel     Status: Abnormal  ? Collection Time: 03/16/22  6:10 PM  ?Result Value Ref Range  ? Sodium 119 (LL) 135 - 145 mmol/L  ?  Comment: CRITICAL RESULT CALLED TO, READ BACK BY AND VERIFIED WITH: ?A.BUTCHER,RN '@2006'$  03/16/2022 VANG.J ?  ? Potassium 4.0 3.5 - 5.1 mmol/L  ? Chloride 80 (L) 98 - 111 mmol/L  ? CO2 26 22 - 32 mmol/L  ? Glucose, Bld 98 70 - 99 mg/dL  ?  Comment: Glucose reference range applies only to samples taken after fasting for at least 8 hours.  ? BUN 9 8 - 23 mg/dL  ? Creatinine, Ser 0.39 (L) 0.44 - 1.00 mg/dL  ? Calcium 8.6 (L) 8.9 - 10.3 mg/dL  ? Total Protein 6.6 6.5 - 8.1 g/dL  ? Albumin 2.6 (L) 3.5 - 5.0 g/dL  ? AST 23 15 - 41 U/L  ? ALT 16 0 - 44 U/L  ? Alkaline Phosphatase 69 38 - 126 U/L  ? Total Bilirubin 0.9 0.3 - 1.2 mg/dL  ? GFR, Estimated >60 >60 mL/min  ?  Comment: (NOTE) ?Calculated using the CKD-EPI Creatinine Equation (2021) ?  ? Anion gap 13 5 - 15  ?  Comment: Performed at [facility], [place] [address] [place], [place] [address]  ?Troponin I (High  Sensitivity)     Status: Abnormal  ? Collection Time: 03/16/22  6:10 PM  ?Result Value Ref Range  ? Troponin I (High Sensitivity) 18 (H) <18 ng/L  ?  Comment: (NOTE) ?Elevated high sensitivity troponin I (hsTnI) values and significant  ?changes across serial measurements may suggest ACS but many other  ?chronic and acute conditions are  known to elevate hsTnI results.  ?Refer to the Links section for chest pain algorithms and additional  ?guidance. ?Performed at [facility], [place] [address], [place], [place] ?[address] ?  ?Brain natriuretic peptide     Status: Abnormal  ? Collection Time: 03/16/22  6:10 PM  ?Result Value Ref Range  ? B Natriuretic Peptide 147.9 (H) 0.0 - 100.0 pg/mL  ?  Comment: Performed at [facility], [place] [address], [place], [place] [address]  ?CBC with Differential     Status: Abnormal  ? Collection Time: 03/16/22  6:10 PM  ?Result Value Ref Range  ? WBC 8.3 4.0 - 10.5 K/uL  ? RBC 4.08 3.87 - 5.11 MIL/uL  ? Hemoglobin 13.8 12.0 - 15.0 g/dL  ? HCT 39.1 36.0 - 46.0 %  ? MCV 95.8 80.0 - 100.0 fL  ? MCH 33.8 26.0 - 34.0 pg  ? MCHC 35.3 30.0 - 36.0 g/dL  ? RDW 13.6 11.5 - 15.5 %  ? Platelets 413 (H) 150 - 400 K/uL  ? nRBC 0.0 0.0 - 0.2 %  ? Neutrophils Relative % 73 %  ? Neutro Abs 6.1 1.7 - 7.7 K/uL  ? Lymphocytes Relative 17 %  ? Lymphs Abs 1.4 0.7 - 4.0 K/uL  ? Monocytes Relative 8 %  ? Monocytes Absolute 0.7 0.1 - 1.0 K/uL  ? Eosinophils Relative 1 %  ? Eosinophils Absolute 0.0 0.0 - 0.5 K/uL  ? Basophils Relative 0 %  ? Basophils Absolute 0.0 0.0 - 0.1 K/uL  ? Immature Granulocytes 1 %  ? Abs Immature Granulocytes 0.04 0.00 - 0.07 K/uL  ?  Comment: Performed at [facility], [place] [address], [place],  [address]  ?Resp Panel by RT-PCR (Flu A&B, Covid) Nasopharyngeal Swab     Status: None  ? Collection Time: 03/16/22  7:50 PM  ? Specimen: Nasopharyngeal Swab; Nasopharyngeal(NP) swabs in vial transport medium  ?Result Value Ref Range  ? SARS Coronavirus 2 by RT PCR NEGATIVE  NEGATIVE  ?  Comment: (NOTE) ?SARS-CoV-2 target nucleic acids are NOT DETECTED. ? ?The SARS-CoV-2 RNA is generally detectable in upper respiratory ?specimens during the acute phase of infection. The lowest ?conc

## 2022-03-17 NOTE — ED Notes (Signed)
Breakfast order placed ?

## 2022-03-17 NOTE — Progress Notes (Signed)
?PROGRESS NOTE ? ? ? ?Carol [name]  DVV:[id number] DOB: 12/18/1935 DOA: 03/16/2022 ?PCP: Shirline Frees, MD ? ?Chief Complaint  ?Patient presents with  ? Shortness of Breath  ? ? ?Brief Narrative: ? ?HPI: Carol [name] is a 86 y.o. female with medical history significant for chronic anxiety/depression, GERD, hyperlipidemia, hypertension, current tobacco use, less than 1 pack/day, who presented to [facility] ED due to gradually worsening shortness of breath of 3 to 4-day duration.  Associated with productive cough with white sputum.  Afebrile.  No chest pain.  No nausea.  Recently evaluated by her primary care provider and diagnosed with a urinary tract infection, was started on doxycycline on 03/13/22, then was switched to [place] on 03/15/2022.  Patient continues to smoke about less than a pack per day.  Not on oxygen supplementation at baseline.  EMS was activated.  Upon arrival, the patient was hypoxic with oxygen saturation 86% on ambient air, placed on 5 L nasal cannula.  In the ED, the patient has conversational dyspnea, on 3 L nasal cannula with O2 saturation of 93%, she is mildly volume overload with trace lower extremity edema, with mild rales noted at bases on lungs auscultation.  Chest x-ray revealed bibasilar infiltrates versus atelectasis.  She was started on treatment for COPD exacerbation.  Patient was admitted by the hospitalist service, [facility]. ?  ?ED Course: Tmax 97.8.  BP 150/56, pulse 85, respiratory 23, saturation 93% on 3 L.  Lab studies remarkable for serum sodium 119, glucose 98, creatinine 0.39, BNP 147, lactic acid 0.7.  Troponin 11.  D-dimer 2.87.  Platelet count 413 rest of CBC unremarkable. ? ?Assessment & Plan: ?  ?Principal Problem: ?  COPD exacerbation ([place]) ?Active Problems: ?  GERD (gastroesophageal reflux disease) ?  Tobacco abuse ?  Essential hypertension ?  Low vitamin B12 level ?  Hyponatremia ?  Abnormal CT of the chest ? ?  ? ?Acute hypoxic respiratory failure ?-She is on room  air at baseline, currently on 3 to 5 L nasal cannula, 86% on room air on admission ?-She was encouraged to use incentive spirometry. ?-This is due to COPD exacerbation and pneumonia. ? ?COPD exacerbation ?-Continue with IV steroids, she was encouraged use incentive spirometry and flutter valve, continue with scheduled DuoNebs and as needed albuterol. ? ?Pneumonia ?-CT chest significant for sup pleural opacities, she presents with cough, she will be treated with IV Rocephin and azithromycin, follow on sputum cultures, Legionella and strep pneumonia antigen ? ?Abnormal CT chest ?-With subpleural opacity, she will need repeat CT chest and 3 to 6 months after treatment of infection ensure resolution of the of nodules ? ?Hyponatremia ?-Low serum osmolality at 249, findings suspicious for SIADH, will start on salt tablets, NS and keep on fluid restrictions, continue to monitor BMP every 8 hours. ?-tSh within normal limit ?  ?Elevated D-dimer, rule out PE ?-CTA chest negative for PE ?  ?Essential hypertension, BP is not at goal, elevated ?Resume home Norvasc, monitor lower extremity edema ?Monitor vital signs ?  ?Chronic anxiety/depression ?Resume home regimen ?  ?Tobacco use disorder ?She was counseled ? ?B12 deficiency ?-Continue with supplements, recheck labs in a.m. ?  ?Hyperlipidemia ?GERD ?Resume home regimen ?  ?Physical debility ?PT OT to assess ?Fall precautions ? ? ? ?DVT prophylaxis: Lovenox ?Code Status: Full ?Family Communication: Daughter at bedside ?Disposition:  ? ?Status is: Inpatient ?Remains inpatient appropriate because: Is hypoxic, on IV steroids and antibiotics, sodium remains significantly low. ?  ?Consultants:  ?None  ?  ? ? ?  Subjective: ? ?Still reports dyspnea, cough, generalized weakness and fatigue ? ?Objective: ?Vitals:  ? 03/17/22 0800 03/17/22 0910 03/17/22 1000 03/17/22 1100  ?BP: (!) 146/73 (!) 147/73 (!) 147/66 139/65  ?Pulse: 88  87 89  ?Resp: 20  (!) 21 (!) 22  ?Temp:      ?TempSrc:       ?SpO2: 93%  94% 94%  ?Weight:      ?Height:      ? ? ?Intake/Output Summary (Last 24 hours) at 03/17/2022 1342 ?Last data filed at 03/17/2022 1124 ?Gross per 24 hour  ?Intake 405 ml  ?Output --  ?Net 405 ml  ? ?Filed Weights  ? 03/16/22 1812  ?Weight: 47.7 kg  ? ? ?Examination: ? ?Awake Alert, Oriented X 3, frail, deconditioned, chronically ill-appearing ?Symmetrical Chest wall movement, Good air movement bilaterally, she is with significant wheezing bilaterally ?RRR,No Gallops,Rubs or new Murmurs, No Parasternal Heave ?+ve B.Sounds, Abd Soft, No tenderness, No rebound - guarding or rigidity. ?No Cyanosis, Clubbing or edema, No new Rash or bruise   ? ? ? ? ?Data Reviewed: I have personally reviewed following labs and imaging studies ? ?CBC: ?Recent Labs  ?Lab 03/16/22 ?1810 03/17/22 ?0505  ?WBC 8.3 5.1  ?NEUTROABS 6.1 4.4  ?HGB 13.8 12.6  ?HCT 39.1 35.8*  ?MCV 95.8 96.0  ?PLT 413* 388  ? ? ?Basic Metabolic Panel: ?Recent Labs  ?Lab 03/16/22 ?1555 03/16/22 ?1810 03/17/22 ?0505 03/17/22 ?1207  ?NA 121* 119* 122* 121*  ?K 3.8 4.0 3.9 4.1  ?CL 82* 80* 84* 83*  ?CO2 '26 26 27 26  '$ ?GLUCOSE 102* 98 103* 98  ?BUN '8 9 8 9  '$ ?CREATININE 0.41* 0.39* 0.50 0.48  ?CALCIUM 8.2* 8.6* 8.4* 8.9  ?MG  --   --  1.8  --   ?PHOS  --   --  4.5  --   ? ? ?GFR: ?Estimated Creatinine Clearance: 40.1 mL/min (by C-G formula based on SCr of 0.48 mg/dL). ? ?Liver Function Tests: ?Recent Labs  ?Lab 03/16/22 ?1810 03/17/22 ?0505  ?AST 23 15  ?ALT 16 14  ?ALKPHOS 69 58  ?BILITOT 0.9 0.8  ?PROT 6.6 5.8*  ?ALBUMIN 2.6* 2.2*  ? ? ?CBG: ?No results for input(s): GLUCAP in the last 168 hours. ? ? ?Recent Results (from the past 240 hour(s))  ?Resp Panel by RT-PCR (Flu A&B, Covid) Nasopharyngeal Swab     Status: None  ? Collection Time: 03/16/22  7:50 PM  ? Specimen: Nasopharyngeal Swab; Nasopharyngeal(NP) swabs in vial transport medium  ?Result Value Ref Range Status  ? SARS Coronavirus 2 by RT PCR NEGATIVE NEGATIVE Final  ?  Comment: (NOTE) ?SARS-CoV-2  target nucleic acids are NOT DETECTED. ? ?The SARS-CoV-2 RNA is generally detectable in upper respiratory ?specimens during the acute phase of infection. The lowest ?concentration of SARS-CoV-2 viral copies this assay can detect is ?138 copies/mL. A negative result does not preclude SARS-Cov-2 ?infection and should not be used as the sole basis for treatment or ?other patient management decisions. A negative result may occur with  ?improper specimen collection/handling, submission of specimen other ?than nasopharyngeal swab, presence of viral mutation(s) within the ?areas targeted by this assay, and inadequate number of viral ?copies(<138 copies/mL). A negative result must be combined with ?clinical observations, patient history, and epidemiological ?information. The expected result is Negative. ? ?Fact Sheet for Patients:  ?EntrepreneurPulse.com.au ? ?Fact Sheet for Healthcare Providers:  ?IncredibleEmployment.be ? ?This test is no t yet approved or cleared by the [place]  FDA and  ?has been authorized for detection and/or diagnosis of SARS-CoV-2 by ?FDA under an Emergency Use Authorization (EUA). This EUA will remain  ?in effect (meaning this test can be used) for the duration of the ?COVID-19 declaration under Section 564(b)(1) of the Act, 21 ?U.S.C.section 360bbb-3(b)(1), unless the authorization is terminated  ?or revoked sooner.  ? ? ?  ? Influenza A by PCR NEGATIVE NEGATIVE Final  ? Influenza B by PCR NEGATIVE NEGATIVE Final  ?  Comment: (NOTE) ?The Xpert Xpress SARS-CoV-2/FLU/RSV plus assay is intended as an aid ?in the diagnosis of influenza from Nasopharyngeal swab specimens and ?should not be used as a sole basis for treatment. Nasal washings and ?aspirates are unacceptable for Xpert Xpress SARS-CoV-2/FLU/RSV ?testing. ? ?Fact Sheet for Patients: ?EntrepreneurPulse.com.au ? ?Fact Sheet for Healthcare  Providers: ?IncredibleEmployment.be ? ?This test is not yet approved or cleared by the [place] FDA and ?has been authorized for detection and/or diagnosis of SARS-CoV-2 by ?FDA under an Emergency Use Authorization (EUA). This EUA

## 2022-03-18 DIAGNOSIS — R9389 Abnormal findings on diagnostic imaging of other specified body structures: Secondary | ICD-10-CM | POA: Diagnosis not present

## 2022-03-18 DIAGNOSIS — J441 Chronic obstructive pulmonary disease with (acute) exacerbation: Secondary | ICD-10-CM | POA: Diagnosis not present

## 2022-03-18 DIAGNOSIS — J9601 Acute respiratory failure with hypoxia: Secondary | ICD-10-CM | POA: Diagnosis not present

## 2022-03-18 LAB — BASIC METABOLIC PANEL
Anion gap: 8 (ref 5–15)
BUN: 7 mg/dL — ABNORMAL LOW (ref 8–23)
CO2: 31 mmol/L (ref 22–32)
Calcium: 8.6 mg/dL — ABNORMAL LOW (ref 8.9–10.3)
Chloride: 92 mmol/L — ABNORMAL LOW (ref 98–111)
Creatinine, Ser: 0.5 mg/dL (ref 0.44–1.00)
GFR, Estimated: >60 mL/min (ref >60)
Glucose, Bld: 106 mg/dL — ABNORMAL HIGH (ref 70–99)
Potassium: 4 mmol/L (ref 3.5–5.1)
Sodium: 131 mmol/L — ABNORMAL LOW (ref 135–145)

## 2022-03-18 LAB — CBC
HCT: 36.3 % (ref 36.0–46.0)
Hemoglobin: 12.6 g/dL (ref 12.0–15.0)
MCH: 33.4 pg (ref 26.0–34.0)
MCHC: 34.7 g/dL (ref 30.0–36.0)
MCV: 96.3 fL (ref 80.0–100.0)
Platelets: 420 10*3/uL — ABNORMAL HIGH (ref 150–400)
RBC: 3.77 MIL/uL — ABNORMAL LOW (ref 3.87–5.11)
RDW: 13.7 % (ref 11.5–15.5)
WBC: 8.9 10*3/uL (ref 4.0–10.5)
nRBC: 0.3 % — ABNORMAL HIGH (ref 0.0–0.2)

## 2022-03-18 LAB — VITAMIN B12: Vitamin B-12: 2290 pg/mL — ABNORMAL HIGH (ref 180–914)

## 2022-03-18 MED ORDER — METHYLPREDNISOLONE SODIUM SUCC 40 MG IJ SOLR
40.0000 mg | Freq: Every day | INTRAMUSCULAR | Status: DC
  Administered 2022-03-19: 40 mg via INTRAVENOUS
  Filled 2022-03-18 (×2): qty 1

## 2022-03-18 MED ORDER — AZITHROMYCIN 500 MG PO TABS
500.0000 mg | ORAL_TABLET | Freq: Every day | ORAL | Status: DC
  Administered 2022-03-18 – 2022-03-20 (×3): 500 mg via ORAL
  Filled 2022-03-18 (×3): qty 1

## 2022-03-18 NOTE — Evaluation (Signed)
Occupational Therapy Evaluation ?Patient Details ?Name: Carol [name] ?MRN: [id number] ?DOB: 1936/08/09 ?Today's Date: 03/18/2022 ? ? ?History of Present Illness Carol A Gonzalez is a 86 y.o. female with medical history significant for chronic anxiety/depression, GERD, hyperlipidemia, hypertension, current tobacco use, less than 1 pack/day, who presented to [facility] ED due to gradually worsening shortness of breath of 3 to 4-day duration.  ? ?Clinical Impression ?  ?Carol was evaluated s/p the above admission list, she is generally indep at baseline and lives alone. She has supportive family near by who are able to assist as needed. Upon evaluation she demonstrated min G functional mobility and needed up to min A for ADLs. She is limited by generalized weakness, decreased activity tolerance and new O2 requirement. She will benefit from energy conservation strategy education. Recommend d/c to  home with [place].  ?   ? ?Recommendations for follow up therapy are one component of a multi-disciplinary discharge planning process, led by the attending physician.  Recommendations may be updated based on patient status, additional functional criteria and insurance authorization.  ? ?Follow Up Recommendations ? Home health OT  ?  ?Assistance Recommended at Discharge Frequent or constant Supervision/Assistance  ?Patient can return home with the following A little help with walking and/or transfers;A little help with bathing/dressing/bathroom;Assist for transportation;Help with stairs or ramp for entrance;Direct supervision/assist for medications management ? ?  ?Functional Status Assessment ? Patient has had a recent decline in their functional status and demonstrates the ability to make significant improvements in function in a reasonable and predictable amount of time.  ?Equipment Recommendations ? BSC/3in1;Other (comment) (O2)  ?  ?   ?Precautions / Restrictions Precautions ?Precautions: Other (comment) ?Precaution Comments:  Watch O2 sats ?Restrictions ?Weight Bearing Restrictions: No  ? ?  ? ?Mobility Bed Mobility ?Overal bed mobility: Needs Assistance ?Bed Mobility: Supine to Sit ?  ?  ?Supine to sit: Min guard ?  ?  ?  ?  ? ?Transfers ?Overall transfer level: Needs assistance ?Equipment used: 1 person hand held assist ?Transfers: Sit to/from Stand ?Sit to Stand: Min guard ?  ?  ?  ?  ?  ?  ?  ? ?  ?Balance Overall balance assessment: Mild deficits observed, not formally tested ?  ?  ?  ?  ?  ?  ?  ?  ?   ? ?ADL either performed or assessed with clinical judgement  ? ?ADL Overall ADL's : Needs assistance/impaired ?Eating/Feeding: Independent;Sitting ?  ?Grooming: Min guard;Standing ?  ?Upper Body Bathing: Set up;Sitting ?  ?Lower Body Bathing: Minimal assistance;Sit to/from stand ?  ?Upper Body Dressing : Set up;Sitting ?  ?Lower Body Dressing: Sit to/from stand ?  ?Toilet Transfer: Min guard;Ambulation ?  ?Toileting- Clothing Manipulation and Hygiene: Supervision/safety;Sitting/lateral lean ?  ?  ?  ?Functional mobility during ADLs: Min guard ?General ADL Comments: min A for LB tasks for energy conservation this date. Pt with new O2 requirement. decreased activity tolerance, a little unsteady  ? ? ? ?Vision Baseline Vision/History: 0 No visual deficits ?Vision Assessment?: No apparent visual deficits  ?   ?   ?   ? ?Pertinent Vitals/Pain Pain Assessment ?Pain Assessment: No/denies pain  ? ? ? ?   ?Extremity/Trunk Assessment Upper Extremity Assessment ?Upper Extremity Assessment: Generalized weakness ?  ?Lower Extremity Assessment ?Lower Extremity Assessment: Defer to PT evaluation ?  ?Cervical / Trunk Assessment ?Cervical / Trunk Assessment: Kyphotic ?  ?Communication Communication ?Communication: No difficulties ?  ?Cognition Arousal/Alertness: Awake/alert ?Behavior  During Therapy: [facility] for tasks assessed/performed ?Overall Cognitive Status: Within Functional Limits for tasks assessed ?  ?  ?  ?  ?  ?  ?  ?  ?  ?General Comments  Pt  remained on 6L nasal canula with SpO2 >92% the entire session ? ?  ? ?Home Living Family/patient expects to be discharged to:: Private residence ?Living Arrangements: Alone ?Available Help at Discharge: Family;Other (Comment) ?Type of Home: House ?Home Access: Stairs to enter ?Entrance Stairs-Number of Steps: 3 ?Entrance Stairs-Rails: None ?Home Layout: One level ?  ?  ?Bathroom Shower/Tub: Tub/shower unit ?  ?Bathroom Toilet: Handicapped height ?  ?  ?Home Equipment: None ?  ?Additional Comments: Able to drive, however hasn't recently due to anxiety ?  ? ?  ?Prior Functioning/Environment Prior Level of Function : Independent/Modified Independent ?  ?  ?  ?  ?  ?  ?Mobility Comments: walks without an assistive device typically ?ADLs Comments: no difficulty ?  ? ?  ?  ?OT Problem List: Decreased strength;Decreased range of motion;Decreased activity tolerance;Decreased knowledge of precautions ?  ?   ?OT Treatment/Interventions: Self-care/ADL training;Therapeutic exercise;Balance training;Patient/family education;Therapeutic activities;DME and/or AE instruction  ?  ?OT Goals(Current goals can be found in the care plan section) Acute Rehab OT Goals ?Patient Stated Goal: home ?OT Goal Formulation: With patient ?Time For Goal Achievement: 04/01/22 ?Potential to Achieve Goals: Good ?ADL Goals ?Pt Will Transfer to Toilet: Independently;ambulating ?Pt Will Perform Toileting - Clothing Manipulation and hygiene: Independently ?Additional ADL Goal #1: pt will demonstreate increased activity tolerance to complete at least 3 ADLs in standing with supervision A  ?OT Frequency: Min 2X/week ?  ? ?   ?AM-PAC OT "6 Clicks" Daily Activity     ?Outcome Measure Help from another person eating meals?: None ?Help from another person taking care of personal grooming?: A Little ?Help from another person toileting, which includes using toliet, bedpan, or urinal?: A Little ?Help from another person bathing (including washing, rinsing,  drying)?: A Little ?Help from another person to put on and taking off regular upper body clothing?: None ?Help from another person to put on and taking off regular lower body clothing?: A Little ?6 Click Score: 20 ?  ?End of Session Equipment Utilized During Treatment: Gait belt;Oxygen ?Nurse Communication: Mobility status ? ?Activity Tolerance: Patient tolerated treatment well ?Patient left: Other (comment) (on [facility] with daughter present) ? ?OT Visit Diagnosis: Unsteadiness on feet (R26.81);Muscle weakness (generalized) (M62.81)  ?              ?Time: 0947-0962 ?OT Time Calculation (min): 20 min ?Charges:  OT General Charges ?$OT Visit: 1 Visit ?OT Evaluation ?$OT Eval Moderate Complexity: 1 Mod ? ? ?Emmette Katt A Chaney Maclaren ?03/18/2022, 3:52 PM ?

## 2022-03-18 NOTE — Progress Notes (Signed)
Physical Therapy Note ? ?(Full PT eval note to follow) ? ?SATURATION QUALIFICATIONS: (This note is used to comply with regulatory documentation for home oxygen) ? ?Patient Saturations on Room Air at Rest = 84% ? ?Patient Saturations on Room Air while Ambulating = 82% ? ?Patient Saturations on 6 Liters of oxygen while Ambulating = 92% ? ?Please briefly explain why patient needs home oxygen: ?Patient requires supplemental oxygen to maintain oxygen saturations at acceptable, safe levels with physical activity. ? ?Roney Marion, PT  ?Acute Rehabilitation Services ?Office [phone] ? ?

## 2022-03-18 NOTE — Evaluation (Signed)
Physical Therapy Evaluation ?Patient Details ?Name: Carol [name] ?MRN: [id number] ?DOB: [birth year] ?Today's Date: 03/18/2022 ? ?History of Present Illness ? Carol [name] is a 86 y.o. female with medical history significant for chronic anxiety/depression, GERD, hyperlipidemia, hypertension, current tobacco use, less than 1 pack/day, who presented to [facility] ED due to gradually worsening shortness of breath of 3 to 4-day duration.  ?Clinical Impression ?  ?Pt admitted with above diagnosis. Lives at home alone, in a single-level home with 3 steps to enter; Prior to admission, pt was able to manage independently, walking without assistive device, driving; Recently driving less due to anxiety; Presents to PT with decr functional mobility due to decr activity tolerance, with significant O2 sat drop with activity on room air;  Pt currently with functional limitations due to the deficits listed below (see PT Problem List). Pt will benefit from skilled PT to increase their independence and safety with mobility to allow discharge to the venue listed below.      ?   ? ?Recommendations for follow up therapy are one component of a multi-disciplinary discharge planning process, led by the attending physician.  Recommendations may be updated based on patient status, additional functional criteria and insurance authorization. ? ?Follow Up Recommendations Home health PT ? ?  ?Assistance Recommended at Discharge Intermittent Supervision/Assistance  ?Patient can return home with the following ? A little help with walking and/or transfers;Help with stairs or ramp for entrance ? ?  ?Equipment Recommendations Rollator (4 wheels);Other (comment) (potentially supplemental oxygen)  ?Recommendations for Other Services ? OT consult (as ordered)  ?  ?Functional Status Assessment Patient has had a recent decline in their functional status and demonstrates the ability to make significant improvements in function in a reasonable and  predictable amount of time.  ? ?  ?Precautions / Restrictions Precautions ?Precautions: Other (comment) ?Precaution Comments: Watch O2 sats  ? ?  ? ?Mobility ? Bed Mobility ?Overal bed mobility: Needs Assistance ?Bed Mobility: Supine to Sit ?  ?  ?Supine to sit: Min guard ?  ?  ?General bed mobility comments: [place] mostly for lines ?  ? ?Transfers ?Overall transfer level: Needs assistance ?Equipment used: Rolling walker (2 wheels) ?Transfers: Sit to/from Stand ?Sit to Stand: Min guard ?  ?  ?  ?  ?  ?General transfer comment: Cues to self-monitor for activity tolerance  ?  ? ?Ambulation/Gait ?Ambulation/Gait assistance: Min guard ?Gait Distance (Feet): 15 Feet ?Assistive device: Rolling walker (2 wheels) ?Gait Pattern/deviations: Decreased step length - right, Decreased step length - left ?  ?  ?  ?General Gait Details: Min assist mostly for management of lines; significant O2 desaturation with activity on Room Air ? ?Stairs ?  ?  ?  ?  ?  ? ?Wheelchair Mobility ?  ? ?Modified Rankin (Stroke Patients Only) ?  ? ?  ? ?Balance Overall balance assessment: Mild deficits observed, not formally tested ?  ?  ?  ?  ?  ?  ?  ?  ?  ?  ?  ?  ?  ?  ?  ?  ?  ?  ?   ? ? ? ?Pertinent Vitals/Pain Pain Assessment ?Pain Assessment: No/denies pain  ? ? ?Home Living Family/patient expects to be discharged to:: Private residence ?Living Arrangements: Alone ?Available Help at Discharge: Family;Other (Comment) (Family can arrange for 24 hour assist) ?Type of Home: House ?Home Access: Stairs to enter ?Entrance Stairs-Rails: None ?Entrance Stairs-Number of Steps: 3 ?  ?Home Layout: One  level ?Home Equipment: None ?Additional Comments: Able to drive, however hasn't recently due to anxiety  ?  ?Prior Function Prior Level of Function : Independent/Modified Independent ?  ?  ?  ?  ?  ?  ?Mobility Comments: walks without an assistive device typically ?ADLs Comments: no difficulty ?  ? ? ?Hand Dominance  ?   ? ?  ?Extremity/Trunk Assessment  ?  Upper Extremity Assessment ?Upper Extremity Assessment: Defer to OT evaluation ?  ? ?Lower Extremity Assessment ?Lower Extremity Assessment: Generalized weakness ?  ? ?   ?Communication  ? Communication: No difficulties  ?Cognition Arousal/Alertness: Awake/alert ?Behavior During Therapy: [facility] for tasks assessed/performed ?Overall Cognitive Status: Within Functional Limits for tasks assessed ?  ?  ?  ?  ?  ?  ?  ?  ?  ?  ?  ?  ?  ?  ?  ?  ?  ?  ?  ? ?  ?General Comments General comments (skin integrity, edema, etc.): See other PT note of this date for O2 sat qualifying walk ? ?  ?Exercises    ? ?Assessment/Plan  ?  ?PT Assessment Patient needs continued PT services  ?PT Problem List Decreased strength;Decreased mobility;Decreased knowledge of use of DME;Decreased safety awareness;Cardiopulmonary status limiting activity ? ?   ?  ?PT Treatment Interventions DME instruction;Gait training;Stair training;Functional mobility training;Therapeutic activities;Therapeutic exercise;Patient/family education;Balance training;Neuromuscular re-education   ? ?PT Goals (Current goals can be found in the Care Plan section)  ?Acute Rehab PT Goals ?Patient Stated Goal: be able to go home ?PT Goal Formulation: With patient ?Time For Goal Achievement: 04/01/22 ?Potential to Achieve Goals: Good ? ?  ?Frequency Min 3X/week ?  ? ? ?Co-evaluation   ?  ?  ?  ?  ? ? ?  ?AM-PAC PT "6 Clicks" Mobility  ?Outcome Measure Help needed turning from your back to your side while in a flat bed without using bedrails?: None ?Help needed moving from lying on your back to sitting on the side of a flat bed without using bedrails?: None ?Help needed moving to and from a bed to a chair (including a wheelchair)?: A Little ?Help needed standing up from a chair using your arms (e.g., wheelchair or bedside chair)?: A Little ?Help needed to walk in hospital room?: A Little ?Help needed climbing 3-5 steps with a railing? : A Lot ?6 Click Score: 19 ? ?  ?End of  Session Equipment Utilized During Treatment: Oxygen ?Activity Tolerance: Patient tolerated treatment well ?Patient left: in chair;with call bell/phone within reach;with chair alarm set;with family/visitor present ?Nurse Communication: Mobility status ?PT Visit Diagnosis: Other abnormalities of gait and mobility (R26.89) ?  ? ?Time: 1324-4010 ?PT Time Calculation (min) (ACUTE ONLY): 45 min ? ? ?Charges:   PT Evaluation ?$PT Eval Moderate Complexity: 1 Mod ?PT Treatments ?$Gait Training: 8-22 mins ?$Therapeutic Activity: 8-22 mins ?  ?   ? ? ?Roney Marion, PT  ?Acute Rehabilitation Services ?Office [phone] ? ? ?Colletta [place] ?03/18/2022, 2:12 PM ? ?

## 2022-03-18 NOTE — Progress Notes (Signed)
?PROGRESS NOTE ? ? ? ?Carol [name]  OHY:[id number] DOB: 04-18-1936 DOA: 03/16/2022 ?PCP: Shirline Frees, MD ? ?Chief Complaint  ?Patient presents with  ? Shortness of Breath  ? ? ?Brief Narrative: ? ?Carol [name] is a 86 y.o. female with medical history significant for chronic anxiety/depression, GERD, hyperlipidemia, hypertension, current tobacco use, less than 1 pack/day, who presented to [facility] ED due to gradually worsening shortness of breath of 3 to 4-day duration.  Associated with productive cough with white sputum.  Her work-up significant for COPD exacerbation and pneumonia (subpleural nodular densities), she was admitted for further work-up.   ? ? ?Assessment & Plan: ?  ?Principal Problem: ?  COPD exacerbation ([place]) ?Active Problems: ?  GERD (gastroesophageal reflux disease) ?  Tobacco abuse ?  Essential hypertension ?  Low vitamin B12 level ?  Hyponatremia ?  Abnormal CT of the chest ? ?  ? ?Acute hypoxic respiratory failure ?-She is on room air at baseline, requiring 3 to 5 L nasal cannula on admission.  86% on room air on admission ?-She was encouraged to use incentive spirometry. ?-This is due to COPD exacerbation and pneumonia. ?-She is still on 3 L nasal cannula this morning. ? ?COPD exacerbation ?-Continue with IV steroids, she was encouraged use incentive spirometry and flutter valve, continue with scheduled DuoNebs and as needed albuterol.  Wheezing has improved, will taper her IV steroids. ? ?Pneumonia ?-CT chest significant for sup pleural opacities, she presents with cough, she will be treated with IV Rocephin and azithromycin, follow on sputum cultures, Legionella and strep pneumonia antigen ? ?Abnormal CT chest ?-With subpleural opacity, she will need repeat CT chest and 3 to 6 months after treatment of infection ensure resolution of the of nodules ? ?Hyponatremia ?-Low serum osmolality at 249, findings suspicious for SIADH. ?-She was treated with fluid restriction, salt tablets, sodium  has normalized today, discontinue salt tablets.   ?-TSH within normal limit ?  ?Elevated D-dimer, rule out PE ?-CTA chest negative for PE ?  ?Essential hypertension, BP is not at goal, elevated ?Pressure acceptable, continue with home dose Norvasc ?  ?Chronic anxiety/depression ?Resume home regimen ?  ?Tobacco use disorder ?She was counseled ? ?B12 deficiency ?-Continue with supplements, repeat B12 level is not low. ?  ?Hyperlipidemia ?GERD ?Resume home regimen ?  ?Physical debility ?PT OT to assess ?Fall precautions ? ? ? ?DVT prophylaxis: Lovenox ?Code Status: Full ?Family Communication: None at bedside ?Disposition:  ? ?Status is: Inpatient ?Remains inpatient appropriate because: Is hypoxic, on IV steroids and antibiotics, sodium remains significantly low. ?  ?Consultants:  ?None  ?  ? ? ?Subjective: ? ?Still reports dyspnea, cough, generalized weakness and fatigue ? ?Objective: ?Vitals:  ? 03/17/22 2307 03/18/22 0307 03/18/22 0727 03/18/22 0800  ?BP: (!) 111/49 (!) 124/59 137/63 139/66  ?Pulse: 76 82 85 86  ?Resp: 20 (!) 22 17 (!) 32  ?Temp: 98.1 ?F (36.7 ?C) 98 ?F (36.7 ?C) 98.2 ?F (36.8 ?C)   ?TempSrc: Oral Oral Oral   ?SpO2: 98% 100% 90% 90%  ?Weight:      ?Height:      ? ? ?Intake/Output Summary (Last 24 hours) at 03/18/2022 1128 ?Last data filed at 03/18/2022 0900 ?Gross per 24 hour  ?Intake 1399.27 ml  ?Output --  ?Net 1399.27 ml  ? ? ?Filed Weights  ? 03/16/22 1812  ?Weight: 47.7 kg  ? ? ?Examination: ? ?Awake Alert, Oriented X 3, frail, deconditioned, chronically ill-appearing ?Symmetrical Chest wall movement, air entry  today, wheezing has improved. ?RRR,No Gallops,Rubs or new Murmurs, No Parasternal Heave ?+ve B.Sounds, Abd Soft, No tenderness, No rebound - guarding or rigidity. ?No Cyanosis, Clubbing or edema, No new Rash or bruise   ? ? ? ? ? ?Data Reviewed: I have personally reviewed following labs and imaging studies ? ?CBC: ?Recent Labs  ?Lab 03/16/22 ?1810 03/17/22 ?0505 03/18/22 ?0203  ?WBC 8.3 5.1  8.9  ?NEUTROABS 6.1 4.4  --   ?HGB 13.8 12.6 12.6  ?HCT 39.1 35.8* 36.3  ?MCV 95.8 96.0 96.3  ?PLT 413* 388 420*  ? ? ? ?Basic Metabolic Panel: ?Recent Labs  ?Lab 03/16/22 ?1810 03/17/22 ?0505 03/17/22 ?1207 03/17/22 ?1930 03/18/22 ?0203  ?NA 119* 122* 121* 127* 131*  ?K 4.0 3.9 4.1 3.9 4.0  ?CL 80* 84* 83* 89* 92*  ?CO2 '26 27 26 28 31  '$ ?GLUCOSE 98 103* 98 113* 106*  ?BUN '9 8 9 9 '$ 7*  ?CREATININE 0.39* 0.50 0.48 0.53 0.50  ?CALCIUM 8.6* 8.4* 8.9 8.9 8.6*  ?MG  --  1.8  --   --   --   ?PHOS  --  4.5  --   --   --   ? ? ? ?GFR: ?Estimated Creatinine Clearance: 40.1 mL/min (by C-G formula based on SCr of 0.5 mg/dL). ? ?Liver Function Tests: ?Recent Labs  ?Lab 03/16/22 ?1810 03/17/22 ?0505  ?AST 23 15  ?ALT 16 14  ?ALKPHOS 69 58  ?BILITOT 0.9 0.8  ?PROT 6.6 5.8*  ?ALBUMIN 2.6* 2.2*  ? ? ? ?CBG: ?No results for input(s): GLUCAP in the last 168 hours. ? ? ?Recent Results (from the past 240 hour(s))  ?Resp Panel by RT-PCR (Flu A&B, Covid) Nasopharyngeal Swab     Status: None  ? Collection Time: 03/16/22  7:50 PM  ? Specimen: Nasopharyngeal Swab; Nasopharyngeal(NP) swabs in vial transport medium  ?Result Value Ref Range Status  ? SARS Coronavirus 2 by RT PCR NEGATIVE NEGATIVE Final  ?  Comment: (NOTE) ?SARS-CoV-2 target nucleic acids are NOT DETECTED. ? ?The SARS-CoV-2 RNA is generally detectable in upper respiratory ?specimens during the acute phase of infection. The lowest ?concentration of SARS-CoV-2 viral copies this assay can detect is ?138 copies/mL. A negative result does not preclude SARS-Cov-2 ?infection and should not be used as the sole basis for treatment or ?other patient management decisions. A negative result may occur with  ?improper specimen collection/handling, submission of specimen other ?than nasopharyngeal swab, presence of viral mutation(s) within the ?areas targeted by this assay, and inadequate number of viral ?copies(<138 copies/mL). A negative result must be combined with ?clinical observations,  patient history, and epidemiological ?information. The expected result is Negative. ? ?Fact Sheet for Patients:  ?EntrepreneurPulse.com.au ? ?Fact Sheet for Healthcare Providers:  ?IncredibleEmployment.be ? ?This test is no t yet approved or cleared by the [place] FDA and  ?has been authorized for detection and/or diagnosis of SARS-CoV-2 by ?FDA under an Emergency Use Authorization (EUA). This EUA will remain  ?in effect (meaning this test can be used) for the duration of the ?COVID-19 declaration under Section 564(b)(1) of the Act, 21 ?U.S.C.section 360bbb-3(b)(1), unless the authorization is terminated  ?or revoked sooner.  ? ? ?  ? Influenza A by PCR NEGATIVE NEGATIVE Final  ? Influenza B by PCR NEGATIVE NEGATIVE Final  ?  Comment: (NOTE) ?The Xpert Xpress SARS-CoV-2/FLU/RSV plus assay is intended as an aid ?in the diagnosis of influenza from Nasopharyngeal swab specimens and ?should not be used as a sole basis  for treatment. Nasal washings and ?aspirates are unacceptable for Xpert Xpress SARS-CoV-2/FLU/RSV ?testing. ? ?Fact Sheet for Patients: ?EntrepreneurPulse.com.au ? ?Fact Sheet for Healthcare Providers: ?IncredibleEmployment.be ? ?This test is not yet approved or cleared by the [place] FDA and ?has been authorized for detection and/or diagnosis of SARS-CoV-2 by ?FDA under an Emergency Use Authorization (EUA). This EUA will remain ?in effect (meaning this test can be used) for the duration of the ?COVID-19 declaration under Section 564(b)(1) of the Act, 21 U.S.C. ?section 360bbb-3(b)(1), unless the authorization is terminated or ?revoked. ? ?Performed at [facility], [place] [address] [place], [place] ?[address] ?  ?Culture, blood (routine x 2)     Status: None (Preliminary result)  ? Collection Time: 03/16/22  8:19 PM  ? Specimen: BLOOD RIGHT WRIST  ?Result Value Ref Range Status  ? Specimen Description BLOOD RIGHT WRIST   Final  ? Special Requests   Final  ?  BOTTLES DRAWN AEROBIC AND ANAEROBIC Blood Culture adequate volume  ? Culture   Final  ?  NO GROWTH 2 DAYS ?Performed at [facility], [place] [address]

## 2022-03-19 DIAGNOSIS — J441 Chronic obstructive pulmonary disease with (acute) exacerbation: Secondary | ICD-10-CM | POA: Diagnosis not present

## 2022-03-19 DIAGNOSIS — R9389 Abnormal findings on diagnostic imaging of other specified body structures: Secondary | ICD-10-CM | POA: Diagnosis not present

## 2022-03-19 DIAGNOSIS — J9601 Acute respiratory failure with hypoxia: Secondary | ICD-10-CM | POA: Diagnosis not present

## 2022-03-19 LAB — CBC
HCT: 36.1 % (ref 36.0–46.0)
Hemoglobin: 11.7 g/dL — ABNORMAL LOW (ref 12.0–15.0)
MCH: 32.3 pg (ref 26.0–34.0)
MCHC: 32.4 g/dL (ref 30.0–36.0)
MCV: 99.7 fL (ref 80.0–100.0)
Platelets: 401 10*3/uL — ABNORMAL HIGH (ref 150–400)
RBC: 3.62 MIL/uL — ABNORMAL LOW (ref 3.87–5.11)
RDW: 14.2 % (ref 11.5–15.5)
WBC: 11.1 10*3/uL — ABNORMAL HIGH (ref 4.0–10.5)
nRBC: 0.2 % (ref 0.0–0.2)

## 2022-03-19 LAB — BASIC METABOLIC PANEL
Anion gap: 6 (ref 5–15)
BUN: 11 mg/dL (ref 8–23)
CO2: 31 mmol/L (ref 22–32)
Calcium: 8.3 mg/dL — ABNORMAL LOW (ref 8.9–10.3)
Chloride: 99 mmol/L (ref 98–111)
Creatinine, Ser: 0.4 mg/dL — ABNORMAL LOW (ref 0.44–1.00)
GFR, Estimated: >60 mL/min (ref >60)
Glucose, Bld: 92 mg/dL (ref 70–99)
Potassium: 3.7 mmol/L (ref 3.5–5.1)
Sodium: 136 mmol/L (ref 135–145)

## 2022-03-19 MED ORDER — ALBUTEROL SULFATE (2.5 MG/3ML) 0.083% IN NEBU
2.5000 mg | INHALATION_SOLUTION | Freq: Two times a day (BID) | RESPIRATORY_TRACT | Status: DC
Start: 2022-03-19 — End: 2022-03-20
  Administered 2022-03-19 – 2022-03-20 (×2): 2.5 mg via RESPIRATORY_TRACT
  Filled 2022-03-19 (×2): qty 3

## 2022-03-19 NOTE — Progress Notes (Signed)
Physical Therapy Treatment ?Patient Details ?Name: Carol [name] ?MRN: [id number] ?DOB: [birth year] ?Today's Date: 03/19/2022 ? ? ?History of Present Illness Carol A Kiehn is a 86 y.o. female with medical history significant for chronic anxiety/depression, GERD, hyperlipidemia, hypertension, current tobacco use, less than 1 pack/day, who presented to [facility] ED due to gradually worsening shortness of breath of 3 to 4-day duration. ? ?  ?PT Comments  ? ? Pt was seen for gait and transfer training with good effort, demonstrating ability to control balance with less assist although not very aware of lines.  Pt is not getting reliable O2 sat readings at times likely due to hand temp being so cool.  Nursing is aware of the issue and monitoring closely as well.  Follow along with her to progress her gait and time OOB in chair, and CNA as well is encouraging pt to be up.  Expecting to go home with her daughter's assist, but the O2 is a complication since she did not use it previously. Will continue to monitor her ability to manage that line.     ?Recommendations for follow up therapy are one component of a multi-disciplinary discharge planning process, led by the attending physician.  Recommendations may be updated based on patient status, additional functional criteria and insurance authorization. ? ?Follow Up Recommendations ? Home health PT ?  ?  ?Assistance Recommended at Discharge Intermittent Supervision/Assistance  ?Patient can return home with the following A little help with walking and/or transfers;Help with stairs or ramp for entrance ?  ?Equipment Recommendations ? Rollator (4 wheels);Other (comment) (likely O2)  ?  ?Recommendations for Other Services OT consult ? ? ?  ?Precautions / Restrictions Precautions ?Precautions: Other (comment) ?Precaution Comments: ck sats ?Restrictions ?Weight Bearing Restrictions: No  ?  ? ?Mobility ? Bed Mobility ?Overal bed mobility: Needs Assistance ?Bed Mobility: Supine to  Sit, Sit to Supine ?  ?  ?Supine to sit: Supervision ?Sit to supine: Supervision ?  ?General bed mobility comments: managing lines and observation of sats, on L hand which is quite cool to touch ?  ? ?Transfers ?Overall transfer level: Needs assistance ?Equipment used: 1 person hand held assist ?Transfers: Sit to/from Stand ?Sit to Stand: Min guard ?  ?  ?  ?  ?  ?General transfer comment: has demonstrated limited line awareness but is able to move with supervision to min guard ?  ? ?Ambulation/Gait ?Ambulation/Gait assistance: Min guard ?Gait Distance (Feet): 24 Feet ?Assistive device: 1 person hand held assist ?Gait Pattern/deviations: Step-to pattern, Decreased stride length ?Gait velocity: reduced ?Gait velocity interpretation: <1.31 ft/sec, indicative of household ambulator ?Pre-gait activities: standing balance ck and lines ?General Gait Details: min guard on side of bed with sidesteps to observe sats, which are a struggle to keep on telemetry ? ? ?Stairs ?  ?  ?  ?  ?  ? ? ?Wheelchair Mobility ?  ? ?Modified Rankin (Stroke Patients Only) ?  ? ? ?  ?Balance Overall balance assessment: Needs assistance ?Sitting-balance support: Feet supported ?Sitting balance-Leahy Scale: Good ?  ?  ?Standing balance support: Single extremity supported ?Standing balance-Leahy Scale: Fair ?Standing balance comment: very minor balance support in standing with pt at bed ?  ?  ?  ?  ?  ?  ?  ?  ?  ?  ?  ?  ? ?  ?Cognition Arousal/Alertness: Awake/alert ?Behavior During Therapy: [facility] for tasks assessed/performed ?Overall Cognitive Status: Within Functional Limits for tasks assessed ?  ?  ?  ?  ?  ?  ?  ?  ?  ?  ?  ?  ?  ?  ?  ?  ?  ?  ?  ? ?  ?  Exercises   ? ?  ?General Comments General comments (skin integrity, edema, etc.): titrated to 6L from 5 due to desaturations with time OOB on BSC and then walking without it. On 6 is closer to 93% or greater ?  ?  ? ?Pertinent Vitals/Pain Pain Assessment ?Pain Assessment: No/denies pain   ? ? ?Home Living   ?  ?  ?  ?  ?  ?  ?  ?  ?  ?   ?  ?Prior Function    ?  ?  ?   ? ?PT Goals (current goals can now be found in the care plan section) Acute Rehab PT Goals ?Patient Stated Goal: be able to go home ?Progress towards PT goals: Progressing toward goals ? ?  ?Frequency ? ? ? Min 3X/week ? ? ? ?  ?PT Plan Current plan remains appropriate  ? ? ?Co-evaluation   ?  ?  ?  ?  ? ?  ?AM-PAC PT "6 Clicks" Mobility   ?Outcome Measure ? Help needed turning from your back to your side while in a flat bed without using bedrails?: None ?Help needed moving from lying on your back to sitting on the side of a flat bed without using bedrails?: A Little ?Help needed moving to and from a bed to a chair (including a wheelchair)?: A Little ?Help needed standing up from a chair using your arms (e.g., wheelchair or bedside chair)?: A Little ?Help needed to walk in hospital room?: A Little ?Help needed climbing 3-5 steps with a railing? : A Lot ?6 Click Score: 18 ? ?  ?End of Session Equipment Utilized During Treatment: Oxygen ?Activity Tolerance: Treatment limited secondary to medical complications (Comment) ?Patient left: in bed;with call bell/phone within reach;with bed alarm set ?Nurse Communication: Mobility status ?PT Visit Diagnosis: Other abnormalities of gait and mobility (R26.89) ?  ? ? ?Time: 1132-1201 ?PT Time Calculation (min) (ACUTE ONLY): 29 min ? ?Charges:  $Gait Training: 8-22 mins ?$Therapeutic Activity: 8-22 mins      ?Ramond Dial ?03/19/2022, 12:42 PM ? ?Mee Hives, PT PhD ?Acute Rehab Dept. Number: [facility] [phone] and [place] [phone] ? ? ?

## 2022-03-19 NOTE — Progress Notes (Signed)
Pulse ox sensor on fingers not picking up saturations due to patient being cold. Pulse ox switched to patient's right ear.  ? ?SATURATION QUALIFICATIONS: (This note is used to comply with regulatory documentation for home oxygen) ? ?Patient Saturations on Room Air at Rest = 85% ? ?Patients saturations on 4L at rest = 95% ? ?Patient Saturations on 6 Liters of oxygen while Ambulating = 92% ? ?Please briefly explain why patient needs home oxygen: ?Patient requires 4L at rest and 6 liters with exertion to maintain patient's oxygenation goal of 92%. ?

## 2022-03-19 NOTE — TOC Initial Note (Addendum)
Transition of Care (TOC) - Initial/Assessment Note  ? ? ?Patient Details  ?Name: Carol [name] ?MRN: [id number] ?Date of Birth: 1936/10/17 ? ?Transition of Care (TOC) CM/SW Contact:    ?Cyndi Bender, RN ?Phone Number: ?03/19/2022, 1:30 PM ? ?Clinical Narrative:              ?Spoke to patient and daughter regarding transition needs. Daughter, Carol [name], lives doors down from her mother and can assist her. Orders for home health and DME. Patient defers to [facility] to find highly rated home health agency. Tommi Rumps with [facility] accepted referral. Jasmine with [employer] notified of DME order for walker, 3&1 and home 02. DME to be delivered to the home except portable tank.Patient and daughter aware that patient can't smoke near oxygen. Carol [name] will transport patient home and is aware of plan for early discharge tomorrow. ?Address, Phone number and PCP verified.  ? ?Expected Discharge Plan: [place] ?Barriers to Discharge: Continued Medical Work up ? ? ?Patient Goals and CMS Choice ?Patient states their goals for this hospitalization and ongoing recovery are:: return home ?CMS Medicare.gov Compare Post Acute Care list provided to:: Patient ?Choice offered to / list presented to : Patient ? ?Expected Discharge Plan and Services ?Expected Discharge Plan: [place] ?  ?Discharge Planning Services: CM Consult ?Post Acute Care Choice: Durable Medical Equipment, Home Health ?Living arrangements for the past 2 months: [place] ?                ?DME Arranged: 3-N-1, Walker rolling, Oxygen ?DME Agency: [facility] ?Date DME Agency Contacted: 03/19/22 ?Time DME Agency Contacted: [phone] ?Representative spoke with at DME Agency: Delana Meyer ?HH Arranged: PT, OT ?[place] Agency: [place] ?Date HH Agency Contacted: 03/19/22 ?Time [place]: [phone] ?Representative spoke with at [place]: Tommi Rumps ? ?Prior Living Arrangements/Services ?Living arrangements for the past 2 months: [place] ?Lives with:: Self ?Patient language and need for interpreter reviewed:: Yes ?Do you feel safe going back to the place where you live?: Yes      ?Need for Family Participation in Patient Care: Yes (Comment) ?Care giver support system in place?: Yes (comment) ?  ?Criminal Activity/Legal Involvement Pertinent to Current Situation/Hospitalization: No - Comment as needed ? ?Activities of Daily Living ?Home Assistive Devices/Equipment: None ?ADL Screening (condition at time of admission) ?Patient's cognitive ability adequate to safely complete daily activities?: Yes ?Is the patient deaf or have difficulty hearing?: Yes ?Does the patient have difficulty seeing, even when wearing glasses/contacts?: Yes ?Does the patient have difficulty concentrating, remembering, or making decisions?: No ?Patient able to express need for assistance with ADLs?: Yes ?Does the patient have difficulty dressing or bathing?: Yes (not baseline) ?Independently performs ADLs?: Yes (appropriate for developmental age) ?Does the patient have difficulty walking or climbing stairs?: Yes ?Weakness of Legs: Both ?Weakness of Arms/Hands: Both ? ?Permission Sought/Granted ?Permission sought to share information with : Case Manager ?Permission granted to share information with : Yes, Verbal Permission Granted ?   ? Permission granted to share info w AGENCY: Home health and DME ?   ?   ? ?Emotional Assessment ?Appearance:: Appears stated age ?Attitude/Demeanor/Rapport: Engaged ?Affect (typically observed): Accepting ?Orientation: : Oriented to Self, Oriented to Place, Oriented to  Time, Oriented to Situation ?Alcohol / Substance Use: Not Applicable ?Psych Involvement: No (comment) ? ?Admission diagnosis:  Hyponatremia [E87.1] ?COPD exacerbation ([place]) [J44.1] ?Acute respiratory failure with hypoxia ([place]) [J96.01] ?Patient Active Problem List  ?  Diagnosis Date Noted  ? Hyponatremia 03/17/2022  ? Abnormal CT of the chest 03/17/2022  ? COPD exacerbation  ([place]) 03/16/2022  ? COPD (chronic obstructive pulmonary disease) ([place]): Probable 04/27/2019  ? Low vitamin B12 level 04/27/2019  ? Gastrointestinal hemorrhage   ? Hypoxia 04/24/2019  ? Hematochezia 04/24/2019  ? Tobacco abuse 04/24/2019  ? Anxiety 04/24/2019  ? Osteoporosis 04/24/2019  ? Essential hypertension 04/24/2019  ? Cystitis 04/24/2019  ? Tinea cruris 04/24/2019  ? Colitis 01/04/2018  ? Rectal bleeding 01/04/2018  ? GERD (gastroesophageal reflux disease) 01/04/2018  ? ?PCP:  Shirline Frees, MD ?Pharmacy:   ?[place], [address] ?[address] ?[place] [address] ?Phone: [phone] Fax: [phone] ? ? ? ? ?Social Determinants of Health (SDOH) Interventions ?  ? ?Readmission Risk Interventions ?   ? View : No data to display.  ?  ?  ?  ? ? ? ?

## 2022-03-19 NOTE — Progress Notes (Signed)
?PROGRESS NOTE ? ? ? ?Carol A Spychalski  OEU:[id number] DOB: June 27, 1936 DOA: 03/16/2022 ?PCP: Shirline Frees, MD ? ?Chief Complaint  ?Patient presents with  ? Shortness of Breath  ? ? ?Brief Narrative: ? ?Carol [name] is a 86 y.o. female with medical history significant for chronic anxiety/depression, GERD, hyperlipidemia, hypertension, current tobacco use, less than 1 pack/day, who presented to [facility] ED due to gradually worsening shortness of breath of 3 to 4-day duration.  Associated with productive cough with white sputum.  Her work-up significant for COPD exacerbation and pneumonia (subpleural nodular densities), she was admitted for further work-up.   ? ? ?Assessment & Plan: ?  ?Principal Problem: ?  COPD exacerbation ([place]) ?Active Problems: ?  GERD (gastroesophageal reflux disease) ?  Tobacco abuse ?  Essential hypertension ?  Low vitamin B12 level ?  Hyponatremia ?  Abnormal CT of the chest ? ?  ? ?Acute hypoxic respiratory failure ?-She is on room air at baseline, requiring 3 to 5 L nasal cannula on admission.  86% on room air on admission ?-She was encouraged to use incentive spirometry. ?-This is due to COPD exacerbation and pneumonia. ?-She is still on 3 L nasal cannula this morning.  She will need to go home on oxygen. ? ?COPD exacerbation ?-Continue with IV steroids, she was encouraged use incentive spirometry and flutter valve, continue with scheduled DuoNebs and as needed albuterol.  Wheezing has improved, will change IV Solu-Medrol to oral prednisone ? ?Pneumonia ?-CT chest significant for sup pleural opacities, she presents with cough, she will be treated with IV Rocephin and azithromycin, follow on sputum cultures, Legionella and strep pneumonia antigen ? ?Abnormal CT chest ?-With subpleural opacity, she will need repeat CT chest and 3 to 6 months after treatment of infection ensure resolution of the of nodules ? ?Hyponatremia ?-Low serum osmolality at 249, findings suspicious for SIADH. ?-She  was treated with fluid restriction, salt tablets, sodium has normalized, will discontinue salt tablets, will remove fluid restrictions. ?-TSH within normal limit ?  ?Elevated D-dimer, rule out PE ?-CTA chest negative for PE ?  ?Essential hypertension, BP is not at goal, elevated ?Pressure acceptable, continue with home dose Norvasc ?  ?Chronic anxiety/depression ?Resume home regimen ?  ?Tobacco use disorder ?She was counseled ? ?B12 deficiency ?-Continue with supplements, repeat B12 level is not low. ?  ?Hyperlipidemia ?GERD ?Resume home regimen ?  ?Physical debility ?PT OT to assess ?Fall precautions ? ? ? ?DVT prophylaxis: Lovenox ?Code Status: Full ?Family Communication: None at bedside ?Disposition:  ? ?Status is: Inpatient ?Remains inpatient appropriate because: Is hypoxic, on IV steroids and antibiotics, sodium remains significantly low. ?  ?Consultants:  ?None  ?  ? ? ?Subjective: ? ?Dyspnea still present, but much improved, no further cough, reports generalized weakness and fatigue. ?Objective: ?Vitals:  ? 03/19/22 0742 03/19/22 0815 03/19/22 0818 03/19/22 1202  ?BP: (!) 141/63   133/61  ?Pulse: 70   84  ?Resp: 18   20  ?Temp: 97.9 ?F (36.6 ?C)   (!) 97.5 ?F (36.4 ?C)  ?TempSrc: Oral   Oral  ?SpO2:  97% 97% 99%  ?Weight:      ?Height:      ? ? ?Intake/Output Summary (Last 24 hours) at 03/19/2022 1251 ?Last data filed at 03/18/2022 1800 ?Gross per 24 hour  ?Intake 450 ml  ?Output --  ?Net 450 ml  ? ? ?Filed Weights  ? 03/16/22 1812  ?Weight: 47.7 kg  ? ? ?Examination: ? ?Awake Alert,  Oriented X 3, frail, deconditioned ?Symmetrical Chest wall movement, Good air movement bilaterally, minimal wheezing ?RRR,No Gallops,Rubs or new Murmurs, No Parasternal Heave ?+ve B.Sounds, Abd Soft, No tenderness, No rebound - guarding or rigidity. ?No Cyanosis, Clubbing or edema, No new Rash or bruise   ? ? ? ? ? ?Data Reviewed: I have personally reviewed following labs and imaging studies ? ?CBC: ?Recent Labs  ?Lab  03/16/22 ?1810 03/17/22 ?0505 03/18/22 ?0203 03/19/22 ?0116  ?WBC 8.3 5.1 8.9 11.1*  ?NEUTROABS 6.1 4.4  --   --   ?HGB 13.8 12.6 12.6 11.7*  ?HCT 39.1 35.8* 36.3 36.1  ?MCV 95.8 96.0 96.3 99.7  ?PLT 413* 388 420* 401*  ? ? ? ?Basic Metabolic Panel: ?Recent Labs  ?Lab 03/17/22 ?0505 03/17/22 ?1207 03/17/22 ?1930 03/18/22 ?0203 03/19/22 ?0116  ?NA 122* 121* 127* 131* 136  ?K 3.9 4.1 3.9 4.0 3.7  ?CL 84* 83* 89* 92* 99  ?CO2 '27 26 28 31 31  '$ ?GLUCOSE 103* 98 113* 106* 92  ?BUN '8 9 9 '$ 7* 11  ?CREATININE 0.50 0.48 0.53 0.50 0.40*  ?CALCIUM 8.4* 8.9 8.9 8.6* 8.3*  ?MG 1.8  --   --   --   --   ?PHOS 4.5  --   --   --   --   ? ? ? ?GFR: ?Estimated Creatinine Clearance: 40.1 mL/min (A) (by C-G formula based on SCr of 0.4 mg/dL (L)). ? ?Liver Function Tests: ?Recent Labs  ?Lab 03/16/22 ?1810 03/17/22 ?0505  ?AST 23 15  ?ALT 16 14  ?ALKPHOS 69 58  ?BILITOT 0.9 0.8  ?PROT 6.6 5.8*  ?ALBUMIN 2.6* 2.2*  ? ? ? ?CBG: ?No results for input(s): GLUCAP in the last 168 hours. ? ? ?Recent Results (from the past 240 hour(s))  ?Resp Panel by RT-PCR (Flu A&B, Covid) Nasopharyngeal Swab     Status: None  ? Collection Time: 03/16/22  7:50 PM  ? Specimen: Nasopharyngeal Swab; Nasopharyngeal(NP) swabs in vial transport medium  ?Result Value Ref Range Status  ? SARS Coronavirus 2 by RT PCR NEGATIVE NEGATIVE Final  ?  Comment: (NOTE) ?SARS-CoV-2 target nucleic acids are NOT DETECTED. ? ?The SARS-CoV-2 RNA is generally detectable in upper respiratory ?specimens during the acute phase of infection. The lowest ?concentration of SARS-CoV-2 viral copies this assay can detect is ?138 copies/mL. A negative result does not preclude SARS-Cov-2 ?infection and should not be used as the sole basis for treatment or ?other patient management decisions. A negative result may occur with  ?improper specimen collection/handling, submission of specimen other ?than nasopharyngeal swab, presence of viral mutation(s) within the ?areas targeted by this assay, and  inadequate number of viral ?copies(<138 copies/mL). A negative result must be combined with ?clinical observations, patient history, and epidemiological ?information. The expected result is Negative. ? ?Fact Sheet for Patients:  ?EntrepreneurPulse.com.au ? ?Fact Sheet for Healthcare Providers:  ?IncredibleEmployment.be ? ?This test is no t yet approved or cleared by the [place] FDA and  ?has been authorized for detection and/or diagnosis of SARS-CoV-2 by ?FDA under an Emergency Use Authorization (EUA). This EUA will remain  ?in effect (meaning this test can be used) for the duration of the ?COVID-19 declaration under Section 564(b)(1) of the Act, 21 ?U.S.C.section 360bbb-3(b)(1), unless the authorization is terminated  ?or revoked sooner.  ? ? ?  ? Influenza A by PCR NEGATIVE NEGATIVE Final  ? Influenza B by PCR NEGATIVE NEGATIVE Final  ?  Comment: (NOTE) ?The Xpert Xpress SARS-CoV-2/FLU/RSV plus assay  is intended as an aid ?in the diagnosis of influenza from Nasopharyngeal swab specimens and ?should not be used as a sole basis for treatment. Nasal washings and ?aspirates are unacceptable for Xpert Xpress SARS-CoV-2/FLU/RSV ?testing. ? ?Fact Sheet for Patients: ?EntrepreneurPulse.com.au ? ?Fact Sheet for Healthcare Providers: ?IncredibleEmployment.be ? ?This test is not yet approved or cleared by the [place] FDA and ?has been authorized for detection and/or diagnosis of SARS-CoV-2 by ?FDA under an Emergency Use Authorization (EUA). This EUA will remain ?in effect (meaning this test can be used) for the duration of the ?COVID-19 declaration under Section 564(b)(1) of the Act, 21 U.S.C. ?section 360bbb-3(b)(1), unless the authorization is terminated or ?revoked. ? ?Performed at [facility], [place] [address], [place], [place] ?[address] ?  ?Culture, blood (routine x 2)     Status: None (Preliminary result)  ? Collection Time:  03/16/22  8:19 PM  ? Specimen: BLOOD RIGHT WRIST  ?Result Value Ref Range Status  ? Specimen Description BLOOD RIGHT WRIST  Final  ? Special Requests   Final  ?  BOTTLES DRAWN AEROBIC AND ANAEROBIC Blood Culture adequate volum

## 2022-03-19 NOTE — Progress Notes (Signed)
Initial Nutrition Assessment ? ?DOCUMENTATION CODES:  ? ?Severe malnutrition in context of chronic illness ? ?INTERVENTION:  ? ?Continue Multivitamin w/ minerals daily ?Discontinue Ensure Enlive  ?Liberalize pt diet to regular due to malnutrition.  ?Encourage good PO intake  ? ?NUTRITION DIAGNOSIS:  ? ?Severe Malnutrition related to chronic illness (COPD) as evidenced by moderate fat depletion, severe muscle depletion, energy intake < or equal to 75% for > or equal to 1 month. ? ?GOAL:  ? ?Patient will meet greater than or equal to 90% of their needs ? ?MONITOR:  ? ?PO intake, Labs, Weight trends ? ?REASON FOR ASSESSMENT:  ? ?Malnutrition Screening Tool ?  ? ?ASSESSMENT:  ? ?86 y.o. female presented to the ED with shortness of breath x 3-4 days. PMH includes GERD, COPD, HTN, and tobacco use. Pt admitted with COPD exacerbation.  ? ?Pt reports that her appetite has been poor for a while now, but the past 3 weeks she has had a lot of nausea. States that her appetite has improved since being admitted. Pt reports a typical intake of ?Breakfast: oatmeal or toast or pancakes or waffles ?Lunch: pack of crackers ?Dinner: baked potato ?Pt reports that she does her cooking, if she even cooks. ? ?Pt unable to provide UBW, but endorses a 6# weight loss within the past 6 months- year. No weights within EMR to assess weight loss within the past year.  ? ?Discussed ONS with pt, pt does not like the taste of Ensure. Discussed Boost Breeze with pt, pt does not wish to try this either at this time.  ? ?Pt denies any difficulty ordering meals and that her daughter had called and placed multiple ahead.  ? ?Medications reviewed and include: Zithromax, Calcium-Vitamin D, Solu-Medrol, MVI, Protonix, Senokot, IV antibiotics ?Labs reviewed. ? ?NUTRITION - FOCUSED PHYSICAL EXAM: ? ?Flowsheet Row Most Recent Value  ?Orbital Region Moderate depletion  ?Upper Arm Region No depletion  ?Thoracic and Lumbar Region No depletion  ?Buccal Region  Moderate depletion  ?Temple Region Moderate depletion  ?Clavicle Bone Region Severe depletion  ?Clavicle and Acromion Bone Region Severe depletion  ?Scapular Bone Region Severe depletion  ?Dorsal Hand Severe depletion  ?Patellar Region Severe depletion  ?Anterior Thigh Region Severe depletion  ?Posterior Calf Region Severe depletion  ?Edema (RD Assessment) Mild  ?Hair Reviewed  ?Eyes Reviewed  ?Mouth Reviewed  ?Skin Reviewed  ?Nails Reviewed  ? ?Diet Order:   ?Diet Order   ? ?       ?  Diet Heart Room service appropriate? Yes; Fluid consistency: Thin  Diet effective now       ?  ? ?  ?  ? ?  ? ? ?EDUCATION NEEDS:  ? ?No education needs have been identified at this time ? ?Skin:  Skin Assessment: Reviewed RN Assessment ? ?Last BM:  4/21 ? ?Height:  ? ?Ht Readings from Last 1 Encounters:  ?03/16/22 '5\' 1"'$  (1.549 m)  ? ? ?Weight:  ? ?Wt Readings from Last 1 Encounters:  ?03/16/22 47.7 kg  ? ? ?Ideal Body Weight:  47.7 kg ? ?BMI:  Body mass index is 19.87 kg/m?. ? ?Estimated Nutritional Needs:  ? ?Kcal:  1500-1700 ? ?Protein:  75-90 grams ? ?Fluid:  >/= 1.5 L ? ? ? ?Hermina Barters RD, LDN ?Clinical Dietitian ?See AMiON for contact information.  ? ?

## 2022-03-19 NOTE — Progress Notes (Signed)
?  Transition of Care (TOC) Screening Note ? ? ?Patient Details  ?Name: [place] A Metter ?Date of Birth: Aug 21, 1936 ? ? ?Transition of Care (TOC) CM/SW Contact:    ?Cyndi Bender, RN ?Phone Number: ?03/19/2022, 8:20 AM ? ? ? ?Transition of Care Department [facility]) has reviewed patient and no TOC needs have been identified at this time. We will continue to monitor patient advancement through interdisciplinary progression rounds. If new patient transition needs arise, please place a TOC consult. ? ? ?

## 2022-03-20 ENCOUNTER — Other Ambulatory Visit (HOSPITAL_COMMUNITY): Payer: Self-pay

## 2022-03-20 DIAGNOSIS — J441 Chronic obstructive pulmonary disease with (acute) exacerbation: Secondary | ICD-10-CM | POA: Diagnosis not present

## 2022-03-20 DIAGNOSIS — I10 Essential (primary) hypertension: Secondary | ICD-10-CM

## 2022-03-20 DIAGNOSIS — R9389 Abnormal findings on diagnostic imaging of other specified body structures: Secondary | ICD-10-CM | POA: Diagnosis not present

## 2022-03-20 DIAGNOSIS — J9601 Acute respiratory failure with hypoxia: Secondary | ICD-10-CM | POA: Diagnosis not present

## 2022-03-20 DIAGNOSIS — E46 Unspecified protein-calorie malnutrition: Secondary | ICD-10-CM | POA: Insufficient documentation

## 2022-03-20 DIAGNOSIS — E43 Unspecified severe protein-calorie malnutrition: Secondary | ICD-10-CM

## 2022-03-20 MED ORDER — PREDNISONE 10 MG (21) PO TBPK
ORAL_TABLET | ORAL | 0 refills | Status: DC
  Filled 2022-03-20: qty 21, 6d supply, fill #0

## 2022-03-20 MED ORDER — PREDNISONE 20 MG PO TABS
40.0000 mg | ORAL_TABLET | Freq: Once | ORAL | Status: AC
  Administered 2022-03-20: 40 mg via ORAL
  Filled 2022-03-20: qty 2

## 2022-03-20 MED ORDER — AZITHROMYCIN 500 MG PO TABS
500.0000 mg | ORAL_TABLET | Freq: Every day | ORAL | 0 refills | Status: AC
  Filled 2022-03-20: qty 2, 2d supply, fill #0

## 2022-03-20 MED ORDER — CEFUROXIME AXETIL 250 MG PO TABS
250.0000 mg | ORAL_TABLET | Freq: Two times a day (BID) | ORAL | 0 refills | Status: AC
Start: 2022-03-21 — End: 2022-03-23
  Filled 2022-03-20: qty 4, 2d supply, fill #0

## 2022-03-20 NOTE — Discharge Instructions (Signed)
Follow with Primary MD Shirline Frees, MD in 7 days  ? ?Get CBC, CMP, 2 view Chest X ray checked  by Primary MD next visit.  ? ? ?Activity: As tolerated with Full fall precautions use walker/cane & assistance as needed ? ? ?Disposition Home  ? ? ?Diet: Heart Healthy  ?For Heart failure patients - Check your Weight same time everyday, if you gain over 2 pounds, or you develop in leg swelling, experience more shortness of breath or chest pain, call your Primary MD immediately. Follow Cardiac Low Salt Diet and 1.5 lit/day fluid restriction. ? ? ?On your next visit with your primary care physician please Get Medicines reviewed and adjusted. ? ? ?Please request your Prim.MD to go over all Hospital Tests and Procedure/Radiological results at the follow up, please get all Hospital records sent to your Prim MD by signing hospital release before you go home. ? ? ?If you experience worsening of your admission symptoms, develop shortness of breath, life threatening emergency, suicidal or homicidal thoughts you must seek medical attention immediately by calling 911 or calling your MD immediately  if symptoms less severe. ? ?You Must read complete instructions/literature along with all the possible adverse reactions/side effects for all the Medicines you take and that have been prescribed to you. Take any new Medicines after you have completely understood and accpet all the possible adverse reactions/side effects.  ? ?Do not drive, operating heavy machinery, perform activities at heights, swimming or participation in water activities or provide baby sitting services if your were admitted for syncope or siezures until you have seen by Primary MD or a Neurologist and advised to do so again. ? ?Do not drive when taking Pain medications.  ? ? ?Do not take more than prescribed Pain, Sleep and Anxiety Medications ? ?Special Instructions: If you have smoked or chewed Tobacco  in the last 2 yrs please stop smoking, stop any regular  Alcohol  and or any Recreational drug use. ? ?Wear Seat belts while driving. ? ? ?Please note ? ?You were cared for by a hospitalist during your hospital stay. If you have any questions about your discharge medications or the care you received while you were in the hospital after you are discharged, you can call the unit and asked to speak with the hospitalist on call if the hospitalist that took care of you is not available. Once you are discharged, your primary care physician will handle any further medical issues. Please note that NO REFILLS for any discharge medications will be authorized once you are discharged, as it is imperative that you return to your primary care physician (or establish a relationship with a primary care physician if you do not have one) for your aftercare needs so that they can reassess your need for medications and monitor your lab values.  ?

## 2022-03-20 NOTE — Plan of Care (Signed)
?  Problem: Health Behavior/Discharge Planning: ?Goal: Ability to manage health-related needs will improve ?Outcome: Adequate for Discharge ?  ?Problem: Clinical Measurements: ?Goal: Ability to maintain clinical measurements within normal limits will improve ?Outcome: Adequate for Discharge ?Goal: Will remain free from infection ?Outcome: Adequate for Discharge ?Goal: Diagnostic test results will improve ?Outcome: Adequate for Discharge ?Goal: Respiratory complications will improve ?Outcome: Adequate for Discharge ?Goal: Cardiovascular complication will be avoided ?Outcome: Adequate for Discharge ?  ?Problem: Activity: ?Goal: Risk for activity intolerance will decrease ?Outcome: Adequate for Discharge ?  ?Problem: Nutrition: ?Goal: Adequate nutrition will be maintained ?Outcome: Adequate for Discharge ?  ?Problem: Coping: ?Goal: Level of anxiety will decrease ?Outcome: Adequate for Discharge ?  ?Problem: Elimination: ?Goal: Will not experience complications related to bowel motility ?Outcome: Adequate for Discharge ?Goal: Will not experience complications related to urinary retention ?Outcome: Adequate for Discharge ?  ?Problem: Safety: ?Goal: Ability to remain free from injury will improve ?Outcome: Adequate for Discharge ?  ?Problem: Skin Integrity: ?Goal: Risk for impaired skin integrity will decrease ?Outcome: Adequate for Discharge ?  ?Problem: Education: ?Goal: Knowledge of disease or condition will improve ?Outcome: Adequate for Discharge ?Goal: Knowledge of the prescribed therapeutic regimen will improve ?Outcome: Adequate for Discharge ?Goal: Individualized Educational Video(s) ?Outcome: Adequate for Discharge ?  ?Problem: Activity: ?Goal: Ability to tolerate increased activity will improve ?Outcome: Adequate for Discharge ?Goal: Will verbalize the importance of balancing activity with adequate rest periods ?Outcome: Adequate for Discharge ?  ?Problem: Respiratory: ?Goal: Ability to maintain a clear airway  will improve ?Outcome: Adequate for Discharge ?Goal: Levels of oxygenation will improve ?Outcome: Adequate for Discharge ?Goal: Ability to maintain adequate ventilation will improve ?Outcome: Adequate for Discharge ?  ?Problem: Activity: ?Goal: Ability to tolerate increased activity will improve ?Outcome: Adequate for Discharge ?  ?Problem: Clinical Measurements: ?Goal: Ability to maintain a body temperature in the normal range will improve ?Outcome: Adequate for Discharge ?  ?Problem: Respiratory: ?Goal: Ability to maintain adequate ventilation will improve ?Outcome: Adequate for Discharge ?Goal: Ability to maintain a clear airway will improve ?Outcome: Adequate for Discharge ?  ?

## 2022-03-20 NOTE — Progress Notes (Signed)
Occupational Therapy Treatment ?Patient Details ?Name: Carol [name] ?MRN: [id number] ?DOB: 06/05/1936 ?Today's Date: 03/20/2022 ? ? ?History of present illness Carol [name] is a 86 y.o. female with medical history significant for chronic anxiety/depression, GERD, hyperlipidemia, hypertension, current tobacco use, less than 1 pack/day, who presented to [facility] ED due to gradually worsening shortness of breath of 3 to 4-day duration. ?  ?OT comments ? Pt completes grooming tasks in standing as well as toilet transfers with supervision level without an assistive device.  She was able to stand at the sink for 10 mins while completing oral hygiene and washing hands and face with sats decreasing to 87% on 3Ls.  Increased to 4Ls with sats ranging from 98-92%.  HR remained stable in the 80's.  Will continue to follow for acute care OT.   ? ?Recommendations for follow up therapy are one component of a multi-disciplinary discharge planning process, led by the attending physician.  Recommendations may be updated based on patient status, additional functional criteria and insurance authorization. ?   ?Follow Up Recommendations ? Home health OT  ?  ?Assistance Recommended at Discharge Frequent or constant Supervision/Assistance  ?Patient can return home with the following ? A little help with walking and/or transfers;A little help with bathing/dressing/bathroom;Assist for transportation;Help with stairs or ramp for entrance;Direct supervision/assist for medications management ?  ?Equipment Recommendations ? Other (comment) (O2)  ?  ?   ?Precautions / Restrictions Precautions ?Precaution Comments: monitor sats ?Restrictions ?Weight Bearing Restrictions: No  ? ? ?  ? ?Mobility Bed Mobility ?  ?  ?  ?  ?  ?  ?  ?  ?  ? ?Transfers ?Overall transfer level: Needs assistance ?Equipment used: None ?Transfers: Sit to/from Stand ?Sit to Stand: Supervision ?  ?  ?  ?  ?  ?General transfer comment: Pt ambulated from the bed to the sink  and toilet with supervision and no device. ?  ?  ?Balance Overall balance assessment: Needs assistance ?  ?Sitting balance-Leahy Scale: Normal ?  ?  ?Standing balance support: No upper extremity supported ?Standing balance-Leahy Scale: Good ?  ?  ?  ?  ?  ?  ?  ?  ?  ?  ?  ?  ?   ? ?ADL either performed or assessed with clinical judgement  ? ?ADL Overall ADL's : Needs assistance/impaired ?  ?  ?Grooming: Supervision/safety;Standing;Oral care;Wash/dry hands;Wash/dry face ?  ?  ?  ?  ?  ?Upper Body Dressing : Minimal assistance;Standing ?Upper Body Dressing Details (indicate cue type and reason): to donn robe ?  ?  ?Toilet Transfer: Supervision/safety;Regular Toilet ?Toilet Transfer Details (indicate cue type and reason): Pt ambulated without use of an assistive device to the toilet ?Toileting- Clothing Manipulation and Hygiene: Supervision/safety;Sit to/from stand ?  ?  ?  ?Functional mobility during ADLs: Supervision/safety ?General ADL Comments: Pt's O2 sats decreasing to 87% on 3Ls nasal cannula while standing for grooming tasks, increased up to 4Ls with sats at 89-91% with activity.  Provided handout with brief education on energy conservation strategies for selfcare.  She reports not having a shower seat but plans to get one at discharge. ?  ? ? ?   ?   ?   ? ?Cognition Arousal/Alertness: Awake/alert ?Behavior During Therapy: [facility] for tasks assessed/performed ?Overall Cognitive Status: Within Functional Limits for tasks assessed ?  ?  ?  ?  ?  ?  ?  ?  ?  ?  ?  ?  ?  ?  ?  ?  ?  ?  ?  ?   ?   ?   ?   ? ? ?  Pertinent Vitals/ Pain       Pain Assessment ?Pain Assessment: No/denies pain ? ?   ?   ? ?Frequency ? Min 2X/week  ? ? ? ? ?  ?Progress Toward Goals ? ?OT Goals(current goals can now be found in the care plan section) ? Progress towards OT goals: Progressing toward goals ? ?Acute Rehab OT Goals ?OT Goal Formulation: With patient ?Time For Goal Achievement: 04/01/22 ?Potential to Achieve Goals: Good  ?Plan  Discharge plan remains appropriate   ? ?   ?AM-PAC OT "6 Clicks" Daily Activity     ?Outcome Measure ? ? Help from another person eating meals?: None ?Help from another person taking care of personal grooming?: None ?Help from another person toileting, which includes using toliet, bedpan, or urinal?: A Little ?Help from another person bathing (including washing, rinsing, drying)?: A Little ?Help from another person to put on and taking off regular upper body clothing?: None ?Help from another person to put on and taking off regular lower body clothing?: A Little ?6 Click Score: 21 ? ?  ?End of Session Equipment Utilized During Treatment: Oxygen ? ?OT Visit Diagnosis: Unsteadiness on feet (R26.81);Muscle weakness (generalized) (M62.81) ?  ?Activity Tolerance Patient tolerated treatment well ?  ?Patient Left in chair;with call bell/phone within reach;with nursing/sitter in room ?  ?Nurse Communication Mobility status ?  ? ?   ? ?Time: 2956-2130 ?OT Time Calculation (min): 29 min ? ?Charges: OT General Charges ?$OT Visit: 1 Visit ?OT Treatments ?$Self Care/Home Management : 23-37 mins ? ?Deborra Phegley OTR/L ?03/20/2022, 8:48 AM ?

## 2022-03-20 NOTE — Care Management Important Message (Signed)
Important Message ? ?Patient Details  ?Name: Carol [name] ?MRN: [id number] ?Date of Birth: 09/20/1936 ? ? ?Medicare Important Message Given:  Yes ? ?Patient left prior to IM delivery will mail IM to the patient Home address.  ? ? ?[name] ?03/20/2022, 3:08 PM ?

## 2022-03-20 NOTE — Discharge Summary (Signed)
Physician Discharge Summary  ?Carol A Armacost TKW:[id number] DOB: [birth year] DOA: 03/16/2022 ? ?PCP: Shirline Frees, MD ? ?Admit date: 03/16/2022 ?Discharge date: 03/20/2022 ? ?Admitted From: Home ?Disposition:  Home ? ?Recommendations for Outpatient Follow-up:  ?Follow up with PCP in 1-2 weeks ?Patient will need repeat CT chest in 3 to 6 months to ensure resolution of subpleural opacities ?Discontinue counseling about smoke cessation ? ?Home Health:YES ? ? ?Discharge Condition:Stable ?CODE STATUS:FULL ?Diet recommendation: Regular diet ? ?Brief/Interim Summary: ? ?Carol [name] is a 86 y.o. female with medical history significant for chronic anxiety/depression, GERD, hyperlipidemia, hypertension, current tobacco use, less than 1 pack/day, who presented to [facility] ED due to gradually worsening shortness of breath of 3 to 4-day duration.  Associated with productive cough with white sputum.  Her work-up significant for COPD exacerbation and pneumonia (subpleural nodular densities), she was admitted for further work-up.   ?  ?  ?Acute hypoxic respiratory failure ?-She is on room air at baseline, requiring 3 to 5 L nasal cannula on admission.  86% on room air on admission ?-Is related to pneumonia and COPD exacerbation, she will be discharged on home oxygen. ?-She was encouraged to take her incentive spirometer and flutter valve and keep using it at home.  Keep taking incentive spirometry and flutter valve. ? ?  ?COPD exacerbation ?-She is with significant wheezing upon presentation, she was treated with IV steroids and antibiotics, wheezing has significantly improved, she will be discharged on steroids taper.  . ? ?  ?Pneumonia ?-CT chest significant for sup pleural opacities, she presents with cough, she was  treated with IV Rocephin and azithromycin, during hospital stay, she will be discharged on oral regimen as well. ?  ?Abnormal CT chest ?-With subpleural opacity, she will need repeat CT chest and 3 to 6 months  after treatment of infection ensure resolution of the of nodules ?  ?Hyponatremia ?-Low serum osmolality at 249, findings suspicious for SIADH. ?-She was treated with fluid restriction, salt tablets, sodium has normalized ?-TSH within normal limit ?  ?Elevated D-dimer, rule out PE ?-CTA chest negative for PE ?  ?Essential hypertension, BP is not at goal, elevated ?Blood pressure acceptable, continue with home dose Norvasc ?  ?Chronic anxiety/depression ?Continue with home regimen ?  ?Tobacco use disorder ?She was counseled ?  ?B12 deficiency ?- repeat B12 level is not low. ?  ?Hyperlipidemia ?GERD ?Resume home regimen ?  ?Physical debility ?Home health arranged on discharge ?  ?Severe PCM ?- she was encouraged take supplements on discharge  ? ?Discharge Diagnoses:  ?Principal Problem: ?  COPD exacerbation ([place]) ?Active Problems: ?  GERD (gastroesophageal reflux disease) ?  Tobacco abuse ?  Essential hypertension ?  Low vitamin B12 level ?  Hyponatremia ?  Abnormal CT of the chest ?  Protein-calorie malnutrition, severe ? ? ? ?Discharge Instructions ? ?Discharge Instructions   ? ? Diet - low sodium heart healthy   Complete by: As directed ?  ? Discharge instructions   Complete by: As directed ?  ? Follow with Primary MD Shirline Frees, MD in 7 days  ? ?Get CBC, CMP, 2 view Chest X ray checked  by Primary MD next visit.  ? ? ?Activity: As tolerated with Full fall precautions use walker/cane & assistance as needed ? ? ?Disposition Home  ? ? ?Diet: Heart Healthy  ?For Heart failure patients - Check your Weight same time everyday, if you gain over 2 pounds, or you develop in leg swelling, experience more  shortness of breath or chest pain, call your Primary MD immediately. Follow Cardiac Low Salt Diet and 1.5 lit/day fluid restriction. ? ? ?On your next visit with your primary care physician please Get Medicines reviewed and adjusted. ? ? ?Please request your Prim.MD to go over all Hospital Tests and  Procedure/Radiological results at the follow up, please get all Hospital records sent to your Prim MD by signing hospital release before you go home. ? ? ?If you experience worsening of your admission symptoms, develop shortness of breath, life threatening emergency, suicidal or homicidal thoughts you must seek medical attention immediately by calling 911 or calling your MD immediately  if symptoms less severe. ? ?You Must read complete instructions/literature along with all the possible adverse reactions/side effects for all the Medicines you take and that have been prescribed to you. Take any new Medicines after you have completely understood and accpet all the possible adverse reactions/side effects.  ? ?Do not drive, operating heavy machinery, perform activities at heights, swimming or participation in water activities or provide baby sitting services if your were admitted for syncope or siezures until you have seen by Primary MD or a Neurologist and advised to do so again. ? ?Do not drive when taking Pain medications.  ? ? ?Do not take more than prescribed Pain, Sleep and Anxiety Medications ? ?Special Instructions: If you have smoked or chewed Tobacco  in the last 2 yrs please stop smoking, stop any regular Alcohol  and or any Recreational drug use. ? ?Wear Seat belts while driving. ? ? ?Please note ? ?You were cared for by a hospitalist during your hospital stay. If you have any questions about your discharge medications or the care you received while you were in the hospital after you are discharged, you can call the unit and asked to speak with the hospitalist on call if the hospitalist that took care of you is not available. Once you are discharged, your primary care physician will handle any further medical issues. Please note that NO REFILLS for any discharge medications will be authorized once you are discharged, as it is imperative that you return to your primary care physician (or establish a  relationship with a primary care physician if you do not have one) for your aftercare needs so that they can reassess your need for medications and monitor your lab values.  ? Increase activity slowly   Complete by: As directed ?  ? ?  ? ?Allergies as of 03/20/2022   ? ?   Reactions  ? Ciprofloxacin Nausea Only, Other (See Comments)  ? Nausea and legs weak  ? Nitrofuran Derivatives Nausea Only, Other (See Comments)  ? nitrofuratonin bad nausea, legs weak  ? Sulfa Antibiotics Other (See Comments)  ? jittery  ? ?  ? ?  ?Medication List  ?  ? ?STOP taking these medications   ? ?ALPRAZolam 0.25 MG tablet ?Commonly known as: Duanne Moron ?  ?cephALEXin 500 MG capsule ?Commonly known as: KEFLEX ?  ?doxycycline 100 MG tablet ?Commonly known as: VIBRA-TABS ?  ? ?  ? ?TAKE these medications   ? ?acetaminophen 650 MG CR tablet ?Commonly known as: TYLENOL ?Take 1,300 mg by mouth daily as needed for pain. ?  ?ALIGN PO ?Take 1 tablet by mouth daily. ?  ?amLODipine 5 MG tablet ?Commonly known as: NORVASC ?Take 5 mg by mouth daily. ?  ?azithromycin 500 MG tablet ?Commonly known as: Zithromax ?Take 1 tablet (500 mg total) by mouth daily for 2  days. ?Start taking on: March 21, 2022 ?  ?CALTRATE 600+D PLUS PO ?Take 1 tablet by mouth daily. ?  ?cefUROXime 250 MG tablet ?Commonly known as: CEFTIN ?Take 1 tablet (250 mg total) by mouth 2 (two) times daily with a meal for 2 days. ?Start taking on: March 21, 2022 ?  ?escitalopram 10 MG tablet ?Commonly known as: LEXAPRO ?Take 10 mg by mouth at bedtime. ?  ?Fluticasone-Salmeterol 250-50 MCG/DOSE Aepb ?Commonly known as: Advair Diskus ?Inhale 1 puff into the lungs 2 (two) times a day. ?  ?LORazepam 0.5 MG tablet ?Commonly known as: ATIVAN ?Take 0.5 mg by mouth 2 (two) times daily as needed for anxiety. ?  ?MAGNESIUM CARBONATE PO ?Take 1 tablet by mouth daily. ?  ?multivitamin with minerals Tabs tablet ?Take 1 tablet by mouth daily. ?  ?nicotine 21 mg/24hr patch ?Commonly known as: NICODERM CQ -  dosed in mg/24 hours ?Place 1 patch (21 mg total) onto the skin daily. ?  ?ondansetron 4 MG disintegrating tablet ?Commonly known as: Zofran ODT ?Take 1 tablet (4 mg total) by mouth every 8 (eight) hours as needed for nausea or vomiti

## 2022-03-21 LAB — CULTURE, BLOOD (ROUTINE X 2)
Culture: NO GROWTH
Culture: NO GROWTH
Special Requests: ADEQUATE

## 2022-03-27 DIAGNOSIS — Z20828 Contact with and (suspected) exposure to other viral communicable diseases: Secondary | ICD-10-CM | POA: Diagnosis not present

## 2022-04-13 DIAGNOSIS — E7800 Pure hypercholesterolemia, unspecified: Secondary | ICD-10-CM | POA: Diagnosis not present

## 2022-04-13 DIAGNOSIS — K219 Gastro-esophageal reflux disease without esophagitis: Secondary | ICD-10-CM | POA: Diagnosis not present

## 2022-04-13 DIAGNOSIS — F17200 Nicotine dependence, unspecified, uncomplicated: Secondary | ICD-10-CM | POA: Diagnosis not present

## 2022-04-13 DIAGNOSIS — F324 Major depressive disorder, single episode, in partial remission: Secondary | ICD-10-CM | POA: Diagnosis not present

## 2022-04-13 DIAGNOSIS — L309 Dermatitis, unspecified: Secondary | ICD-10-CM | POA: Diagnosis not present

## 2022-04-13 DIAGNOSIS — N390 Urinary tract infection, site not specified: Secondary | ICD-10-CM | POA: Diagnosis not present

## 2022-04-13 DIAGNOSIS — J449 Chronic obstructive pulmonary disease, unspecified: Secondary | ICD-10-CM | POA: Diagnosis not present

## 2022-04-13 DIAGNOSIS — R300 Dysuria: Secondary | ICD-10-CM | POA: Diagnosis not present

## 2022-04-13 DIAGNOSIS — I10 Essential (primary) hypertension: Secondary | ICD-10-CM | POA: Diagnosis not present

## 2022-04-13 DIAGNOSIS — E871 Hypo-osmolality and hyponatremia: Secondary | ICD-10-CM | POA: Diagnosis not present

## 2022-04-27 ENCOUNTER — Other Ambulatory Visit: Payer: Self-pay

## 2022-04-27 ENCOUNTER — Emergency Department (HOSPITAL_COMMUNITY)
Admission: EM | Admit: 2022-04-27 | Discharge: 2022-04-27 | Disposition: A | Discharge status: Home / Self Care | Payer: Medicare Other | Attending: Emergency Medicine | Admitting: Emergency Medicine

## 2022-04-27 ENCOUNTER — Encounter (HOSPITAL_COMMUNITY): Payer: Self-pay

## 2022-04-27 ENCOUNTER — Emergency Department (HOSPITAL_COMMUNITY): Payer: Medicare Other

## 2022-04-27 DIAGNOSIS — Z79899 Other long term (current) drug therapy: Secondary | ICD-10-CM | POA: Insufficient documentation

## 2022-04-27 DIAGNOSIS — K5730 Diverticulosis of large intestine without perforation or abscess without bleeding: Secondary | ICD-10-CM | POA: Diagnosis not present

## 2022-04-27 DIAGNOSIS — I700 Atherosclerosis of aorta: Secondary | ICD-10-CM | POA: Diagnosis not present

## 2022-04-27 DIAGNOSIS — R110 Nausea: Secondary | ICD-10-CM | POA: Diagnosis not present

## 2022-04-27 DIAGNOSIS — I959 Hypotension, unspecified: Secondary | ICD-10-CM | POA: Diagnosis not present

## 2022-04-27 DIAGNOSIS — N281 Cyst of kidney, acquired: Secondary | ICD-10-CM | POA: Diagnosis not present

## 2022-04-27 DIAGNOSIS — R109 Unspecified abdominal pain: Secondary | ICD-10-CM | POA: Insufficient documentation

## 2022-04-27 DIAGNOSIS — R1084 Generalized abdominal pain: Secondary | ICD-10-CM | POA: Diagnosis not present

## 2022-04-27 DIAGNOSIS — R0902 Hypoxemia: Secondary | ICD-10-CM | POA: Diagnosis not present

## 2022-04-27 DIAGNOSIS — K219 Gastro-esophageal reflux disease without esophagitis: Secondary | ICD-10-CM

## 2022-04-27 LAB — COMPREHENSIVE METABOLIC PANEL
ALT: 11 U/L (ref 0–44)
AST: 16 U/L (ref 15–41)
Albumin: 4 g/dL (ref 3.5–5.0)
Alkaline Phosphatase: 43 U/L (ref 38–126)
Anion gap: 8 (ref 5–15)
BUN: 9 mg/dL (ref 8–23)
CO2: 30 mmol/L (ref 22–32)
Calcium: 9.5 mg/dL (ref 8.9–10.3)
Chloride: 102 mmol/L (ref 98–111)
Creatinine, Ser: 0.51 mg/dL (ref 0.44–1.00)
GFR, Estimated: >60 mL/min (ref >60)
Glucose, Bld: 89 mg/dL (ref 70–99)
Potassium: 3.9 mmol/L (ref 3.5–5.1)
Sodium: 140 mmol/L (ref 135–145)
Total Bilirubin: 0.7 mg/dL (ref 0.3–1.2)
Total Protein: 7.4 g/dL (ref 6.5–8.1)

## 2022-04-27 LAB — CBC
HCT: 43.2 % (ref 36.0–46.0)
Hemoglobin: 14.2 g/dL (ref 12.0–15.0)
MCH: 32.7 pg (ref 26.0–34.0)
MCHC: 32.9 g/dL (ref 30.0–36.0)
MCV: 99.5 fL (ref 80.0–100.0)
Platelets: 275 10*3/uL (ref 150–400)
RBC: 4.34 MIL/uL (ref 3.87–5.11)
RDW: 15.6 % — ABNORMAL HIGH (ref 11.5–15.5)
WBC: 9.2 10*3/uL (ref 4.0–10.5)
nRBC: 0 % (ref 0.0–0.2)

## 2022-04-27 LAB — LIPASE, BLOOD: Lipase: 26 U/L (ref 11–51)

## 2022-04-27 MED ORDER — ALUM & MAG HYDROXIDE-SIMETH 200-200-20 MG/5ML PO SUSP
30.0000 mL | Freq: Once | ORAL | Status: AC
Start: 2022-04-27 — End: 2022-04-27
  Administered 2022-04-27: 30 mL via ORAL
  Filled 2022-04-27: qty 30

## 2022-04-27 MED ORDER — FAMOTIDINE IN NACL 20-0.9 MG/50ML-% IV SOLN: 20.0000 mg | Freq: Once | INTRAVENOUS | Status: DC

## 2022-04-27 MED ORDER — LIDOCAINE VISCOUS HCL 2 % MT SOLN
15.0000 mL | Freq: Once | OROMUCOSAL | Status: AC
  Administered 2022-04-27: 15 mL via ORAL
  Filled 2022-04-27: qty 15

## 2022-04-27 MED ORDER — SODIUM CHLORIDE 0.9 % IV SOLN: INTRAVENOUS | Status: DC

## 2022-04-27 MED ORDER — SUCRALFATE 1 G PO TABS
1.0000 g | ORAL_TABLET | Freq: Once | ORAL | Status: AC
  Administered 2022-04-27: 1 g via ORAL
  Filled 2022-04-27: qty 1

## 2022-04-27 MED ORDER — IOHEXOL 300 MG/ML  SOLN
80.0000 mL | Freq: Once | INTRAMUSCULAR | Status: AC | PRN
  Administered 2022-04-27: 80 mL via INTRAVENOUS

## 2022-04-27 MED ORDER — PANTOPRAZOLE SODIUM 20 MG PO TBEC: 20.0000 mg | DELAYED_RELEASE_TABLET | Freq: Every day | ORAL | 0 refills | Status: DC

## 2022-04-27 MED ORDER — PANTOPRAZOLE SODIUM 40 MG IV SOLR
40.0000 mg | Freq: Once | INTRAVENOUS | Status: AC
  Administered 2022-04-27: 40 mg via INTRAVENOUS
  Filled 2022-04-27: qty 10

## 2022-04-27 NOTE — ED Triage Notes (Signed)
Per EMS- Patient c/o abdominal pain x 2 weeks, but today was worse. Patient denies any N/v/D, but states she feels "gassy".

## 2022-04-27 NOTE — ED Notes (Signed)
An After Visit Summary was printed and given to the patient. Discharge instructions given and no further questions at this time.  Pt leaving with daughter. Per pt and pt daughter, pt uses O2 [place] 2L as needed and pt does not want to wear it home, pt daughter states pt can put it back on at home. Pt ambulatory, A&Ox4.

## 2022-04-27 NOTE — ED Provider Notes (Signed)
[facility] DEPT Provider Note   CSN: [id number] Arrival date & time: 04/27/22  1514     History  Chief Complaint  Patient presents with   Abdominal Pain    Carol [name] is a 86 y.o. female.  86 year old female who presents with abdominal discomfort x2 weeks.  States that it gets worse before she eats and better after she eats.  Nausea but no vomiting.  No fever or chills.  No constipation noted.  No treatment use for this prior to arrival.  No prior history of same.  Denies any black stools      Home Medications Prior to Admission medications   Medication Sig Start Date End Date Taking? Authorizing Provider  acetaminophen (TYLENOL) 650 MG CR tablet Take 1,300 mg by mouth daily as needed for pain.     [provider]  amLODipine (NORVASC) 5 MG tablet Take 5 mg by mouth daily. Patient not taking: Reported on 03/17/2022 11/14/17   [provider]  Calcium Carbonate-Vit D-Min (CALTRATE 600+D PLUS PO) Take 1 tablet by mouth daily.    [provider]  escitalopram (LEXAPRO) 10 MG tablet Take 10 mg by mouth at bedtime.     [provider]  Fluticasone-Salmeterol (ADVAIR DISKUS) 250-50 MCG/DOSE AEPB Inhale 1 puff into the lungs 2 (two) times a day. Patient not taking: Reported on 03/17/2022 04/27/19   [name], MD  LORazepam (ATIVAN) 0.5 MG tablet Take 0.5 mg by mouth 2 (two) times daily as needed for anxiety. 03/13/22   [provider]  MAGNESIUM CARBONATE PO Take 1 tablet by mouth daily.    [provider]  Multiple Vitamin (MULTIVITAMIN WITH MINERALS) TABS tablet Take 1 tablet by mouth daily.    [provider]  nicotine (NICODERM CQ - DOSED IN MG/24 HOURS) 21 mg/24hr patch Place 1 patch (21 mg total) onto the skin daily. Patient not taking: Reported on 03/17/2022 04/27/19   [name], MD  ondansetron (ZOFRAN ODT) 4 MG disintegrating tablet Take 1 tablet (4 mg total) by mouth  every 8 (eight) hours as needed for nausea or vomiting. Patient not taking: Reported on 01/23/2018 01/06/18   [name], [name], MD  pantoprazole (PROTONIX) 40 MG tablet Take 40 mg by mouth daily. 10/28/17   [provider]  polycarbophil (FIBERCON) 625 MG tablet Take 625 mg by mouth daily.    [provider]  predniSONE (STERAPRED UNI-PAK 21 TAB) 10 MG (21) TBPK tablet Use per package instruction 03/20/22   Elgergawy, Silver Huguenin, MD  Probiotic Product (ALIGN PO) Take 1 tablet by mouth daily.    [provider]  raloxifene (EVISTA) 60 MG tablet Take 60 mg by mouth daily. 12/30/17   [provider]  senna-docusate (SENOKOT-S) 8.6-50 MG tablet Take 1 tablet by mouth daily. 04/28/19   [name], MD  simvastatin (ZOCOR) 20 MG tablet Take 20 mg by mouth at bedtime.     [provider]  sucralfate (CARAFATE) 1 g tablet Take 1 g by mouth 3 (three) times daily as needed. 03/13/22   [provider]  umeclidinium bromide (INCRUSE ELLIPTA) 62.5 MCG/INH AEPB Inhale 1 puff into the lungs daily. Patient not taking: Reported on 03/17/2022 04/28/19   [name], MD  vitamin B-12 (CYANOCOBALAMIN) 1000 MCG tablet Take 1 tablet (1,000 mcg total) by mouth daily. Patient not taking: Reported on 03/17/2022 04/27/19   [name], MD      Allergies  Ciprofloxacin, Nitrofuran derivatives, and Sulfa antibiotics    Review of Systems   Review of Systems  All other systems reviewed and are negative.  Physical Exam Updated Vital Signs BP 129/60 (BP Location: Left Arm)   Pulse 98   Temp 97.9 F (36.6 C) (Oral)   Resp 16   Ht 1.524 m (5')   Wt 44.9 kg   SpO2 99%   BMI 19.33 kg/m  Physical Exam Vitals and nursing note reviewed.  Constitutional:      General: She is not in acute distress.    Appearance: Normal appearance. She is well-developed. She is not toxic-appearing.  HENT:     Head: Normocephalic and atraumatic.  Eyes:     General: Lids are  normal.     Conjunctiva/sclera: Conjunctivae normal.     Pupils: Pupils are equal, round, and reactive to light.  Neck:     Thyroid: No thyroid mass.     Trachea: No tracheal deviation.  Cardiovascular:     Rate and Rhythm: Normal rate and regular rhythm.     Heart sounds: Normal heart sounds. No murmur heard.   No gallop.  Pulmonary:     Effort: Pulmonary effort is normal. No respiratory distress.     Breath sounds: Normal breath sounds. No stridor. No decreased breath sounds, wheezing, rhonchi or rales.  Abdominal:     General: There is no distension.     Palpations: Abdomen is soft.     Tenderness: There is no abdominal tenderness. There is no rebound.  Musculoskeletal:        General: No tenderness. Normal range of motion.     Cervical back: Normal range of motion and neck supple.  Skin:    General: Skin is warm and dry.     Findings: No abrasion or rash.  Neurological:     Mental Status: She is alert and oriented to person, place, and time. Mental status is at baseline.     GCS: GCS eye subscore is 4. GCS verbal subscore is 5. GCS motor subscore is 6.     Cranial Nerves: No cranial nerve deficit.     Sensory: No sensory deficit.     Motor: Motor function is intact.  Psychiatric:        Attention and Perception: Attention normal.        Speech: Speech normal.        Behavior: Behavior normal.    ED Results / Procedures / Treatments   Labs (all labs ordered are listed, but only abnormal results are displayed) Labs Reviewed  CBC - Abnormal; Notable for the following components:      Result Value   RDW 15.6 (*)    All other components within normal limits  LIPASE, BLOOD  COMPREHENSIVE METABOLIC PANEL  URINALYSIS, ROUTINE W REFLEX MICROSCOPIC    EKG None  Radiology No results found.  Procedures Procedures    Medications Ordered in ED Medications  0.9 %  sodium chloride infusion (has no administration in time range)  alum & mag hydroxide-simeth  (MAALOX/MYLANTA) 200-200-20 MG/5ML suspension 30 mL (has no administration in time range)    And  lidocaine (XYLOCAINE) 2 % viscous mouth solution 15 mL (has no administration in time range)  sucralfate (CARAFATE) tablet 1 g (has no administration in time range)  pantoprazole (PROTONIX) injection 40 mg (has no administration in time range)    ED Course/ Medical Decision Making/ A&P  Medical Decision Making Amount and/or Complexity of Data Reviewed Labs: ordered. Radiology: ordered.  Risk OTC drugs. Prescription drug management.   Patient presented with abdominal pain concerning for GERD.  Patient given GI cocktail as well as PPI and feels much better.  Also concern for possible gallbladder pathology versus ruptured ulcer and abdominal CT did not show any findings.  This was per my review and interpretation.  Patient is labs are reassuring here.  She has no leukocytosis.  Patient's daughter states that she has been a great deal of stress recently and is very nervous.  Patient to be placed on PPI and follow-up with her doctor as needed        Final Clinical Impression(s) / ED Diagnoses Final diagnoses:  None    Rx / DC Orders ED Discharge Orders     None         Lacretia Leigh, MD 04/27/22 1939

## 2022-05-13 ENCOUNTER — Emergency Department (HOSPITAL_COMMUNITY): Payer: Medicare Other

## 2022-05-13 ENCOUNTER — Emergency Department (HOSPITAL_COMMUNITY)
Admission: EM | Admit: 2022-05-13 | Discharge: 2022-05-13 | Disposition: A | Discharge status: Home / Self Care | Payer: Medicare Other | Attending: Emergency Medicine | Admitting: Emergency Medicine

## 2022-05-13 DIAGNOSIS — R1013 Epigastric pain: Secondary | ICD-10-CM | POA: Insufficient documentation

## 2022-05-13 DIAGNOSIS — R112 Nausea with vomiting, unspecified: Secondary | ICD-10-CM | POA: Diagnosis not present

## 2022-05-13 DIAGNOSIS — Z79899 Other long term (current) drug therapy: Secondary | ICD-10-CM | POA: Insufficient documentation

## 2022-05-13 DIAGNOSIS — E860 Dehydration: Secondary | ICD-10-CM | POA: Diagnosis not present

## 2022-05-13 DIAGNOSIS — J449 Chronic obstructive pulmonary disease, unspecified: Secondary | ICD-10-CM | POA: Insufficient documentation

## 2022-05-13 DIAGNOSIS — E876 Hypokalemia: Secondary | ICD-10-CM | POA: Diagnosis not present

## 2022-05-13 DIAGNOSIS — I10 Essential (primary) hypertension: Secondary | ICD-10-CM | POA: Diagnosis not present

## 2022-05-13 DIAGNOSIS — R531 Weakness: Secondary | ICD-10-CM | POA: Diagnosis not present

## 2022-05-13 DIAGNOSIS — R5383 Other fatigue: Secondary | ICD-10-CM | POA: Diagnosis not present

## 2022-05-13 LAB — CBC WITH DIFFERENTIAL/PLATELET
Abs Immature Granulocytes: 0.02 10*3/uL (ref 0.00–0.07)
Basophils Absolute: 0 10*3/uL (ref 0.0–0.1)
Basophils Relative: 0 %
Eosinophils Absolute: 0.1 10*3/uL (ref 0.0–0.5)
Eosinophils Relative: 1 %
HCT: 39.7 % (ref 36.0–46.0)
Hemoglobin: 13 g/dL (ref 12.0–15.0)
Immature Granulocytes: 0 %
Lymphocytes Relative: 26 %
Lymphs Abs: 2.3 10*3/uL (ref 0.7–4.0)
MCH: 32.6 pg (ref 26.0–34.0)
MCHC: 32.7 g/dL (ref 30.0–36.0)
MCV: 99.5 fL (ref 80.0–100.0)
Monocytes Absolute: 0.9 10*3/uL (ref 0.1–1.0)
Monocytes Relative: 10 %
Neutro Abs: 5.6 10*3/uL (ref 1.7–7.7)
Neutrophils Relative %: 63 %
Platelets: 248 10*3/uL (ref 150–400)
RBC: 3.99 MIL/uL (ref 3.87–5.11)
RDW: 15.2 % (ref 11.5–15.5)
WBC: 8.9 10*3/uL (ref 4.0–10.5)
nRBC: 0 % (ref 0.0–0.2)

## 2022-05-13 LAB — URINALYSIS, ROUTINE W REFLEX MICROSCOPIC
Bacteria, UA: NONE SEEN
Bilirubin Urine: NEGATIVE
Glucose, UA: NEGATIVE mg/dL
Hgb urine dipstick: NEGATIVE
Ketones, ur: NEGATIVE mg/dL
Nitrite: NEGATIVE
Protein, ur: NEGATIVE mg/dL
Specific Gravity, Urine: 1.005 (ref 1.005–1.030)
Trans Epithel, UA: 1
pH: 7 (ref 5.0–8.0)

## 2022-05-13 LAB — COMPREHENSIVE METABOLIC PANEL
ALT: 11 U/L (ref 0–44)
AST: 14 U/L — ABNORMAL LOW (ref 15–41)
Albumin: 3.7 g/dL (ref 3.5–5.0)
Alkaline Phosphatase: 41 U/L (ref 38–126)
Anion gap: 9 (ref 5–15)
BUN: 7 mg/dL — ABNORMAL LOW (ref 8–23)
CO2: 34 mmol/L — ABNORMAL HIGH (ref 22–32)
Calcium: 9.5 mg/dL (ref 8.9–10.3)
Chloride: 100 mmol/L (ref 98–111)
Creatinine, Ser: 0.46 mg/dL (ref 0.44–1.00)
GFR, Estimated: >60 mL/min (ref >60)
Glucose, Bld: 101 mg/dL — ABNORMAL HIGH (ref 70–99)
Potassium: 3.2 mmol/L — ABNORMAL LOW (ref 3.5–5.1)
Sodium: 143 mmol/L (ref 135–145)
Total Bilirubin: 0.7 mg/dL (ref 0.3–1.2)
Total Protein: 6.5 g/dL (ref 6.5–8.1)

## 2022-05-13 LAB — AMMONIA: Ammonia: 18 umol/L (ref 9–35)

## 2022-05-13 LAB — TROPONIN I (HIGH SENSITIVITY)
Troponin I (High Sensitivity): 7 ng/L (ref <18)
Troponin I (High Sensitivity): 8 ng/L (ref <18)

## 2022-05-13 LAB — LACTIC ACID, PLASMA: Lactic Acid, Venous: 0.6 mmol/L (ref 0.5–1.9)

## 2022-05-13 LAB — LIPASE, BLOOD: Lipase: 24 U/L (ref 11–51)

## 2022-05-13 LAB — TSH: TSH: 1.292 u[IU]/mL (ref 0.350–4.500)

## 2022-05-13 MED ORDER — ONDANSETRON HCL 4 MG PO TABS: 4.0000 mg | ORAL_TABLET | Freq: Three times a day (TID) | ORAL | 0 refills | Status: DC | PRN

## 2022-05-13 MED ORDER — SODIUM CHLORIDE 0.9 % IV BOLUS
500.0000 mL | Freq: Once | INTRAVENOUS | Status: AC
  Administered 2022-05-13: 500 mL via INTRAVENOUS

## 2022-05-13 MED ORDER — POTASSIUM CHLORIDE CRYS ER 20 MEQ PO TBCR
40.0000 meq | EXTENDED_RELEASE_TABLET | Freq: Once | ORAL | Status: AC
  Administered 2022-05-13: 40 meq via ORAL
  Filled 2022-05-13: qty 2

## 2022-05-13 MED ORDER — ONDANSETRON HCL 4 MG/2ML IJ SOLN
4.0000 mg | Freq: Once | INTRAMUSCULAR | Status: AC
  Administered 2022-05-13: 4 mg via INTRAVENOUS
  Filled 2022-05-13: qty 2

## 2022-05-13 NOTE — ED Provider Notes (Signed)
[facility] DEPT Provider Note   CSN: [id number] Arrival date & time: 05/13/22  1727     History  Chief Complaint  Patient presents with   Failure To Thrive   Weakness    [name] is a 86 y.o. female.  The history is provided by the patient and medical records. No language interpreter was used.  Illness Location:  Generalized fatigue Severity:  Severe Onset quality:  Gradual Duration:  3 weeks Timing:  Constant Progression:  Worsening Chronicity:  New Associated symptoms: abdominal pain, cough, fatigue, nausea, shortness of breath (chronic) and vomiting   Associated symptoms: no chest pain, no congestion, no diarrhea, no fever, no headaches, no loss of consciousness, no rash, no rhinorrhea and no wheezing        Home Medications Prior to Admission medications   Medication Sig Start Date End Date Taking? Authorizing Provider  acetaminophen (TYLENOL) 650 MG CR tablet Take 1,300 mg by mouth daily as needed for pain.     [provider]  amLODipine (NORVASC) 5 MG tablet Take 5 mg by mouth daily. Patient not taking: Reported on 03/17/2022 11/14/17   [provider]  Calcium Carbonate-Vit D-Min (CALTRATE 600+D PLUS PO) Take 1 tablet by mouth daily.    [provider]  escitalopram (LEXAPRO) 10 MG tablet Take 10 mg by mouth at bedtime.     [provider]  Fluticasone-Salmeterol (ADVAIR DISKUS) 250-50 MCG/DOSE AEPB Inhale 1 puff into the lungs 2 (two) times a day. Patient not taking: Reported on 03/17/2022 04/27/19   [name], MD  LORazepam (ATIVAN) 0.5 MG tablet Take 0.5 mg by mouth 2 (two) times daily as needed for anxiety. 03/13/22   [provider]  MAGNESIUM CARBONATE PO Take 1 tablet by mouth daily.    [provider]  Multiple Vitamin (MULTIVITAMIN WITH MINERALS) TABS tablet Take 1 tablet by mouth daily.    [provider]  nicotine (NICODERM CQ - DOSED IN MG/24  HOURS) 21 mg/24hr patch Place 1 patch (21 mg total) onto the skin daily. Patient not taking: Reported on 03/17/2022 04/27/19   [name], MD  ondansetron (ZOFRAN ODT) 4 MG disintegrating tablet Take 1 tablet (4 mg total) by mouth every 8 (eight) hours as needed for nausea or vomiting. Patient not taking: Reported on 01/23/2018 01/06/18   [name], [name], MD  pantoprazole (PROTONIX) 20 MG tablet Take 1 tablet (20 mg total) by mouth daily. 04/27/22   Lacretia Leigh, MD  pantoprazole (PROTONIX) 40 MG tablet Take 40 mg by mouth daily. 10/28/17   [provider]  polycarbophil (FIBERCON) 625 MG tablet Take 625 mg by mouth daily.    [provider]  predniSONE (STERAPRED UNI-PAK 21 TAB) 10 MG (21) TBPK tablet Use per package instruction 03/20/22   Elgergawy, Silver Huguenin, MD  Probiotic Product (ALIGN PO) Take 1 tablet by mouth daily.    [provider]  raloxifene (EVISTA) 60 MG tablet Take 60 mg by mouth daily. 12/30/17   [provider]  senna-docusate (SENOKOT-S) 8.6-50 MG tablet Take 1 tablet by mouth daily. 04/28/19   [name], MD  simvastatin (ZOCOR) 20 MG tablet Take 20 mg by mouth at bedtime.     [provider]  sucralfate (CARAFATE) 1 g tablet Take 1 g by mouth 3 (three) times daily as needed. 03/13/22   [provider]  umeclidinium bromide (INCRUSE ELLIPTA) 62.5 MCG/INH AEPB Inhale 1 puff into the lungs daily.  Patient not taking: Reported on 03/17/2022 04/28/19   [name], MD  vitamin B-12 (CYANOCOBALAMIN) 1000 MCG tablet Take 1 tablet (1,000 mcg total) by mouth daily. Patient not taking: Reported on 03/17/2022 04/27/19   [name], MD      Allergies    Ciprofloxacin, Nitrofuran derivatives, and Sulfa antibiotics    Review of Systems   Review of Systems  Constitutional:  Positive for fatigue. Negative for chills, diaphoresis and fever.  HENT:  Negative for congestion and rhinorrhea.   Respiratory:  Positive for cough  and shortness of breath (chronic). Negative for chest tightness, wheezing and stridor.   Cardiovascular:  Negative for chest pain, palpitations and leg swelling.  Gastrointestinal:  Positive for abdominal pain, nausea and vomiting. Negative for constipation and diarrhea.  Genitourinary:  Negative for dysuria and flank pain.  Musculoskeletal:  Negative for back pain, neck pain and neck stiffness.  Skin:  Negative for rash and wound.  Neurological:  Negative for dizziness, loss of consciousness, weakness, light-headedness and headaches.  Psychiatric/Behavioral:  Negative for agitation and confusion.   All other systems reviewed and are negative.   Physical Exam Updated Vital Signs BP 134/71   Pulse (!) 47   Temp 98.8 F (37.1 C) (Oral)   Resp (!) 26   Ht 5' (1.524 m)   Wt 44.5 kg   SpO2 96%   BMI 19.14 kg/m  Physical Exam Vitals and nursing note reviewed.  Constitutional:      General: She is not in acute distress.    Appearance: She is well-developed. She is not ill-appearing, toxic-appearing or diaphoretic.  HENT:     Head: Normocephalic and atraumatic.     Nose: No congestion or rhinorrhea.     Mouth/Throat:     Mouth: Mucous membranes are dry.  Eyes:     Extraocular Movements: Extraocular movements intact.     Conjunctiva/sclera: Conjunctivae normal.     Pupils: Pupils are equal, round, and reactive to light.  Cardiovascular:     Rate and Rhythm: Normal rate and regular rhythm.     Heart sounds: No murmur heard. Pulmonary:     Effort: Pulmonary effort is normal. No respiratory distress.     Breath sounds: Normal breath sounds. No wheezing, rhonchi or rales.  Chest:     Chest wall: No tenderness.  Abdominal:     General: Abdomen is flat. There is no distension.     Palpations: Abdomen is soft.     Tenderness: There is no abdominal tenderness. There is no right CVA tenderness, left CVA tenderness, guarding or rebound.  Musculoskeletal:        General: No swelling or  tenderness.     Cervical back: Neck supple.     Right lower leg: No edema.     Left lower leg: No edema.  Skin:    General: Skin is warm and dry.     Capillary Refill: Capillary refill takes less than 2 seconds.     Findings: No erythema or rash.  Neurological:     General: No focal deficit present.     Mental Status: She is alert. Mental status is at baseline.     Sensory: No sensory deficit.     Motor: No weakness.  Psychiatric:        Mood and Affect: Mood normal.     ED Results / Procedures / Treatments   Labs (all labs ordered are listed, but only abnormal results are displayed) Labs Reviewed  URINALYSIS, ROUTINE W REFLEX MICROSCOPIC - Abnormal; Notable for the following components:      Result Value   Leukocytes,Ua MODERATE (*)    All other components within normal limits  COMPREHENSIVE METABOLIC PANEL - Abnormal; Notable for the following components:   Potassium 3.2 (*)    CO2 34 (*)    Glucose, Bld 101 (*)    BUN 7 (*)    AST 14 (*)    All other components within normal limits  URINE CULTURE  CBC WITH DIFFERENTIAL/PLATELET  LACTIC ACID, PLASMA  LIPASE, BLOOD  TSH  AMMONIA  TROPONIN I (HIGH SENSITIVITY)  TROPONIN I (HIGH SENSITIVITY)    EKG EKG Interpretation  Date/Time:  Sunday May 13 2022 18:49:50 EDT Ventricular Rate:  88 PR Interval:  157 QRS Duration: 126 QT Interval:  364 QTC Calculation: 441 R Axis:   73 Text Interpretation: Sinus rhythm Probable left ventricular hypertrophy Anterior Q waves, possibly due to LVH when compared to prior, similar appearance with some new t wave inversion in lead V6. No STEMI Confirmed by Antony Blackbird [phone]) on 05/13/2022 6:54:14 PM  Radiology DG Chest Portable 1 View  Result Date: 05/13/2022 CLINICAL DATA:  fatigue, no energy, SOB, upper and pain with nausea and vomiting EXAM: PORTABLE CHEST 1 VIEW COMPARISON:  Chest x-ray 03/16/2022, CT chest 03/16/2022 FINDINGS: The heart and mediastinal contours are  unchanged. Aortic calcification. No focal consolidation. Chronic coarsened markings with no pulmonary edema. No pleural effusion. No pneumothorax. No acute osseous abnormality. IMPRESSION: 1. No active disease. 2. Aortic Atherosclerosis (ICD10-I70.0) and Emphysema (ICD10-J43.9). Electronically Signed   By: Iven Finn M.D.   On: 05/13/2022 19:21   US Abdomen Limited RUQ (LIVER/GB)  Result Date: 05/13/2022 CLINICAL DATA:  Epigastric pain for 1 month. EXAM: ULTRASOUND ABDOMEN LIMITED RIGHT UPPER QUADRANT COMPARISON:  None Available. FINDINGS: Gallbladder: No gallstones or wall thickening visualized. No sonographic Murphy sign noted by sonographer. Wall thickness is 1.6 mm, within normal limits. Common bile duct: Diameter: 2 mm, within normal limits. Liver: No focal lesion identified. Within normal limits in parenchymal echogenicity. Portal vein is patent on color Doppler imaging with normal direction of blood flow towards the liver. Other: None. IMPRESSION: Negative right upper quadrant ultrasound. Electronically Signed   By: San Morelle M.D.   On: 05/13/2022 18:57    Procedures Procedures    Medications Ordered in ED Medications  potassium chloride SA (KLOR-CON M) CR tablet 40 mEq (has no administration in time range)  sodium chloride 0.9 % bolus 500 mL (0 mLs Intravenous Stopped 05/13/22 2245)  ondansetron (ZOFRAN) injection 4 mg (4 mg Intravenous Given 05/13/22 2246)    ED Course/ Medical Decision Making/ A&P                           Medical Decision Making Amount and/or Complexity of Data Reviewed Labs: ordered. Radiology: ordered.  Risk Prescription drug management.    [facility] A W[facility] is a 86 y.o. female with a past medical history significant for COPD on home oxygen at baseline, previous GI bleeding, hypertension, GERD, osteoporosis, anxiety, and previous UTI who presents with worsening fatigue, decreased oral intake, and waxing and waning upper abdominal pain with  nausea and vomiting.  Patient reports that for the last few weeks she has had worsening energy.  She says that she was checked out 2 weeks ago and had a CT abdomen pelvis that did not show evidence of acute abnormality and she was  able to discharge home however she says that she has not been eating or drinking hardly anything due to every time she eats and drinks she gets pain in her upper abdomen with nausea and vomiting.  She says that she is having less and less energy and 2 weeks ago she could get up and walk to the bathroom but now cannot do so.  She needs help to do this.  She reports no new constipation or diarrhea and denies any acute urinary changes.  She reports that a UTI several weeks ago that has resolved after antibiotics.  She reports chronic cough and shortness of breath and that does not feel any different.  Denies any chest pain or palpitations.  She denies any headache or neck pain or new back pain.  Denies any trauma or falls.  Patient simply has no energy and feels that she is wasting away without getting nutrition due to all of the abdominal pain with nausea and vomiting.  On exam, lungs were clear and chest was nontender.  Abdomen was nontender on my exam but she was pointing to her epigastric area as the place she has her discomfort and what she feels is anxiety symptoms.  Legs were not edematous.  Mucous membranes are very dry.  She has dry skin.  Back and flanks nontender.  Likely I am concerned she could have failure to thrive and generalized fatigue and weakness due to dehydration and not getting enough food.  With the continued upper abdominal discomfort with eating and drinking, will get right upper quadrant ultrasound and lab.  She just had a CT scan so we will hold on that at this time.  We will recheck her urine and give her some fluids.  We will get labs.  Anticipate reassessment after work-up to determine disposition.  Work-up returned overall reassuring.  Mild hypokalemia  but otherwise no evidence of acute infection.  No nitrites or bacteria in the urine, chest x-ray reassuring, and she felt much better after nausea medicine and fluids.  Suspect decreased oral intake due to the nausea leading to some dehydration.  Ultrasound did not show acute cholecystitis.  We went through all her work-up together and patient agrees with discharge home.  We will let her pass a p.o. challenge and give her prescription for nausea medicine.  She will call her GI doctor who she is scheduled to see in 2 weeks to try to get seen sooner.  She understands return precautions and follow-up instructions and was discharged in good condition after otherwise reassuring work-up         Final Clinical Impression(s) / ED Diagnoses Final diagnoses:  Dehydration  Fatigue, unspecified type  Nausea and vomiting, unspecified vomiting type    Rx / DC Orders ED Discharge Orders          Ordered    ondansetron (ZOFRAN) 4 MG tablet  Every 8 hours PRN        05/13/22 2308            Clinical Impression: 1. Dehydration   2. Fatigue, unspecified type   3. Nausea and vomiting, unspecified vomiting type     Disposition: Discharge  Condition: Good  I have discussed the results, Dx and Tx plan with the pt(& family if present). He/she/they expressed understanding and agree(s) with the plan. Discharge instructions discussed at great length. Strict return precautions discussed and pt &/or family have verbalized understanding of the instructions. No further questions at time of discharge.  New Prescriptions   ONDANSETRON (ZOFRAN) 4 MG TABLET    Take 1 tablet (4 mg total) by mouth every 8 (eight) hours as needed for nausea or vomiting.    Follow Up: Shirline Frees, MD [address] [place] [address] [phone]     your GI team     [facility] [address] [id number] [place]  [place] [phone]        Karder Goodin, Gwenyth Allegra, MD 05/13/22 [phone]

## 2022-05-13 NOTE — ED Triage Notes (Signed)
Pt to ED via EMS from home c/o failure to thrive, over the past month pt has not been eating much and slower to move. Pt daughter concerned. Pt denies pain. Pt home o2 user. HX COPD. No medications given by EMS. Last VS 150/66, 96  95% 4L 100 CBG.

## 2022-05-13 NOTE — Discharge Instructions (Addendum)
Your history, exam, work-up today are consistent with dehydration related to your decreased oral intake due to your nausea and vomiting over the last week or so.  The work-up did not show evidence of acute infection, significant kidney abnormality, or critical electrolyte abnormality.  We gave you some oral potassium for the mild low potassium.  As the nausea medicine and fluids helped your symptoms, we feel you are safe for discharge home.  Please call your GI team to try to get seen sooner and use the nausea medicine until maintain hydration.  If any symptoms change or worsen acutely, please return to the nearest emergency department.

## 2022-05-13 NOTE — ED Notes (Signed)
Water provided for po challange

## 2022-05-14 ENCOUNTER — Other Ambulatory Visit: Payer: Self-pay | Admitting: Family Medicine

## 2022-05-14 DIAGNOSIS — R9389 Abnormal findings on diagnostic imaging of other specified body structures: Secondary | ICD-10-CM

## 2022-05-15 LAB — URINE CULTURE: Culture: <10000 — AB

## 2022-05-23 DIAGNOSIS — R634 Abnormal weight loss: Secondary | ICD-10-CM | POA: Diagnosis not present

## 2022-05-23 DIAGNOSIS — Z8709 Personal history of other diseases of the respiratory system: Secondary | ICD-10-CM | POA: Diagnosis not present

## 2022-05-23 DIAGNOSIS — K219 Gastro-esophageal reflux disease without esophagitis: Secondary | ICD-10-CM | POA: Diagnosis not present

## 2022-05-23 DIAGNOSIS — R1084 Generalized abdominal pain: Secondary | ICD-10-CM | POA: Diagnosis not present

## 2022-05-25 ENCOUNTER — Other Ambulatory Visit: Payer: Self-pay | Admitting: Gastroenterology

## 2022-05-28 ENCOUNTER — Other Ambulatory Visit: Payer: Self-pay | Admitting: Gastroenterology

## 2022-05-30 DIAGNOSIS — F411 Generalized anxiety disorder: Secondary | ICD-10-CM | POA: Diagnosis not present

## 2022-05-30 DIAGNOSIS — F331 Major depressive disorder, recurrent, moderate: Secondary | ICD-10-CM | POA: Diagnosis not present

## 2022-06-12 ENCOUNTER — Other Ambulatory Visit: Payer: Self-pay | Admitting: Gastroenterology

## 2022-06-12 DIAGNOSIS — R1084 Generalized abdominal pain: Secondary | ICD-10-CM

## 2022-06-27 DIAGNOSIS — F331 Major depressive disorder, recurrent, moderate: Secondary | ICD-10-CM | POA: Diagnosis not present

## 2022-06-27 DIAGNOSIS — F411 Generalized anxiety disorder: Secondary | ICD-10-CM | POA: Diagnosis not present

## 2022-07-02 DIAGNOSIS — F419 Anxiety disorder, unspecified: Secondary | ICD-10-CM | POA: Diagnosis not present

## 2022-07-02 DIAGNOSIS — I10 Essential (primary) hypertension: Secondary | ICD-10-CM | POA: Diagnosis not present

## 2022-07-02 DIAGNOSIS — F324 Major depressive disorder, single episode, in partial remission: Secondary | ICD-10-CM | POA: Diagnosis not present

## 2022-07-02 DIAGNOSIS — R413 Other amnesia: Secondary | ICD-10-CM | POA: Diagnosis not present

## 2022-07-02 DIAGNOSIS — F17200 Nicotine dependence, unspecified, uncomplicated: Secondary | ICD-10-CM | POA: Diagnosis not present

## 2022-07-02 DIAGNOSIS — J449 Chronic obstructive pulmonary disease, unspecified: Secondary | ICD-10-CM | POA: Diagnosis not present

## 2022-07-03 ENCOUNTER — Other Ambulatory Visit: Payer: Medicare Other

## 2022-07-18 ENCOUNTER — Ambulatory Visit
Admission: RE | Admit: 2022-07-18 | Discharge: 2022-07-18 | Disposition: A | Discharge status: Home / Self Care | Payer: Medicare Other | Source: Ambulatory Visit | Attending: Family Medicine | Admitting: Family Medicine

## 2022-07-18 ENCOUNTER — Other Ambulatory Visit: Payer: Medicare Other

## 2022-07-18 DIAGNOSIS — I3139 Other pericardial effusion (noninflammatory): Secondary | ICD-10-CM | POA: Diagnosis not present

## 2022-07-18 DIAGNOSIS — J439 Emphysema, unspecified: Secondary | ICD-10-CM | POA: Diagnosis not present

## 2022-07-18 DIAGNOSIS — J90 Pleural effusion, not elsewhere classified: Secondary | ICD-10-CM | POA: Diagnosis not present

## 2022-07-18 DIAGNOSIS — I700 Atherosclerosis of aorta: Secondary | ICD-10-CM | POA: Diagnosis not present

## 2022-07-18 DIAGNOSIS — R9389 Abnormal findings on diagnostic imaging of other specified body structures: Secondary | ICD-10-CM

## 2022-07-20 DIAGNOSIS — F411 Generalized anxiety disorder: Secondary | ICD-10-CM | POA: Diagnosis not present

## 2022-07-20 DIAGNOSIS — F331 Major depressive disorder, recurrent, moderate: Secondary | ICD-10-CM | POA: Diagnosis not present

## 2022-07-24 DIAGNOSIS — F17200 Nicotine dependence, unspecified, uncomplicated: Secondary | ICD-10-CM | POA: Diagnosis not present

## 2022-07-24 DIAGNOSIS — I10 Essential (primary) hypertension: Secondary | ICD-10-CM | POA: Diagnosis not present

## 2022-07-24 DIAGNOSIS — Z0000 Encounter for general adult medical examination without abnormal findings: Secondary | ICD-10-CM | POA: Diagnosis not present

## 2022-07-24 DIAGNOSIS — F419 Anxiety disorder, unspecified: Secondary | ICD-10-CM | POA: Diagnosis not present

## 2022-07-24 DIAGNOSIS — E7800 Pure hypercholesterolemia, unspecified: Secondary | ICD-10-CM | POA: Diagnosis not present

## 2022-07-24 DIAGNOSIS — M810 Age-related osteoporosis without current pathological fracture: Secondary | ICD-10-CM | POA: Diagnosis not present

## 2022-07-24 DIAGNOSIS — F324 Major depressive disorder, single episode, in partial remission: Secondary | ICD-10-CM | POA: Diagnosis not present

## 2022-07-24 DIAGNOSIS — J449 Chronic obstructive pulmonary disease, unspecified: Secondary | ICD-10-CM | POA: Diagnosis not present

## 2022-07-27 DIAGNOSIS — F411 Generalized anxiety disorder: Secondary | ICD-10-CM | POA: Diagnosis not present

## 2022-07-27 DIAGNOSIS — F331 Major depressive disorder, recurrent, moderate: Secondary | ICD-10-CM | POA: Diagnosis not present

## 2022-08-04 ENCOUNTER — Other Ambulatory Visit: Payer: Self-pay

## 2022-08-04 ENCOUNTER — Encounter (HOSPITAL_BASED_OUTPATIENT_CLINIC_OR_DEPARTMENT_OTHER): Payer: Self-pay | Admitting: Emergency Medicine

## 2022-08-04 ENCOUNTER — Emergency Department (HOSPITAL_BASED_OUTPATIENT_CLINIC_OR_DEPARTMENT_OTHER)
Admission: EM | Admit: 2022-08-04 | Discharge: 2022-08-04 | Disposition: A | Discharge status: Home / Self Care | Payer: Medicare Other | Attending: Emergency Medicine | Admitting: Emergency Medicine

## 2022-08-04 DIAGNOSIS — K5900 Constipation, unspecified: Secondary | ICD-10-CM

## 2022-08-04 DIAGNOSIS — E871 Hypo-osmolality and hyponatremia: Secondary | ICD-10-CM | POA: Insufficient documentation

## 2022-08-04 DIAGNOSIS — R000 Tachycardia, unspecified: Secondary | ICD-10-CM | POA: Diagnosis not present

## 2022-08-04 LAB — COMPREHENSIVE METABOLIC PANEL
ALT: 15 U/L (ref 0–44)
AST: 18 U/L (ref 15–41)
Albumin: 4.2 g/dL (ref 3.5–5.0)
Alkaline Phosphatase: 50 U/L (ref 38–126)
Anion gap: 10 (ref 5–15)
BUN: 9 mg/dL (ref 8–23)
CO2: 30 mmol/L (ref 22–32)
Calcium: 9.3 mg/dL (ref 8.9–10.3)
Chloride: 86 mmol/L — ABNORMAL LOW (ref 98–111)
Creatinine, Ser: 0.5 mg/dL (ref 0.44–1.00)
GFR, Estimated: >60 mL/min (ref >60)
Glucose, Bld: 100 mg/dL — ABNORMAL HIGH (ref 70–99)
Potassium: 4.3 mmol/L (ref 3.5–5.1)
Sodium: 126 mmol/L — ABNORMAL LOW (ref 135–145)
Total Bilirubin: 0.6 mg/dL (ref 0.3–1.2)
Total Protein: 7.5 g/dL (ref 6.5–8.1)

## 2022-08-04 LAB — CBC WITH DIFFERENTIAL/PLATELET
Abs Immature Granulocytes: 0.02 10*3/uL (ref 0.00–0.07)
Basophils Absolute: 0.1 10*3/uL (ref 0.0–0.1)
Basophils Relative: 1 %
Eosinophils Absolute: 0.1 10*3/uL (ref 0.0–0.5)
Eosinophils Relative: 1 %
HCT: 41.2 % (ref 36.0–46.0)
Hemoglobin: 14.2 g/dL (ref 12.0–15.0)
Immature Granulocytes: 0 %
Lymphocytes Relative: 22 %
Lymphs Abs: 2.1 10*3/uL (ref 0.7–4.0)
MCH: 34.1 pg — ABNORMAL HIGH (ref 26.0–34.0)
MCHC: 34.5 g/dL (ref 30.0–36.0)
MCV: 98.8 fL (ref 80.0–100.0)
Monocytes Absolute: 0.8 10*3/uL (ref 0.1–1.0)
Monocytes Relative: 9 %
Neutro Abs: 6.7 10*3/uL (ref 1.7–7.7)
Neutrophils Relative %: 67 %
Platelets: 320 10*3/uL (ref 150–400)
RBC: 4.17 MIL/uL (ref 3.87–5.11)
RDW: 12 % (ref 11.5–15.5)
WBC: 9.8 10*3/uL (ref 4.0–10.5)
nRBC: 0 % (ref 0.0–0.2)

## 2022-08-04 MED ORDER — MINERAL OIL RE ENEM
1.0000 | ENEMA | Freq: Once | RECTAL | Status: AC
  Administered 2022-08-04: 1 via RECTAL

## 2022-08-04 MED ORDER — LACTATED RINGERS IV BOLUS
500.0000 mL | Freq: Once | INTRAVENOUS | Status: AC
  Administered 2022-08-04: 500 mL via INTRAVENOUS

## 2022-08-04 MED ORDER — LORAZEPAM 1 MG PO TABS
1.0000 mg | ORAL_TABLET | Freq: Once | ORAL | Status: AC
  Administered 2022-08-04: 1 mg via ORAL
  Filled 2022-08-04: qty 1

## 2022-08-04 MED ORDER — SODIUM CHLORIDE 1 G PO TABS: 1.0000 g | ORAL_TABLET | Freq: Three times a day (TID) | ORAL | 0 refills | Status: AC

## 2022-08-04 NOTE — Discharge Instructions (Addendum)
Your sodium level is low at 126.  You are being prescribed sodium chloride tablets to help bring this up.  Be careful about your water intake as too much water can lower your sodium.  You need to call your doctor on Monday morning (9/11) to set up an appointment this coming week for repeat sodium check.  If at any point develop headache, weakness, confusion, vomiting, or any other new/concerning symptoms then return to the ER or call 911.

## 2022-08-04 NOTE — ED Notes (Signed)
Enema Results: very sm amt of liquid stool noted in bedside commode, pt states she does feel better after BM

## 2022-08-04 NOTE — ED Notes (Signed)
ED Provider at bedside, with this RN for rectal exam

## 2022-08-04 NOTE — ED Provider Notes (Signed)
[name] Provider Note   CSN: [id number] Arrival date & time: 08/04/22  1100     History  Chief Complaint  Patient presents with   Rectal Pain   Constipation    [name] is a 86 y.o. female.  HPI 86 year old female presents with constipation.  This has been ongoing for about 5 or 6 days.  She had a couple small hard bowel movements in between.  1 time when really straining to have a bowel movement she noticed a little bit of blood when she wiped but no blood in the stool or toilet.  No significant abdominal pain except when trying to have a bowel movement.  No fevers or vomiting.  Daughter at the bedside notes that she has not been eating very well due to trouble swallowing and has lost significant weight over the last month or so. Still drinking ok. She has had some rectal pressure and wonders if she has a hemorrhoid.  Home Medications Prior to Admission medications   Medication Sig Start Date End Date Taking? Authorizing Provider  sodium chloride 1 g tablet Take 1 tablet (1 g total) by mouth 3 (three) times daily with meals for 7 days. 08/04/22 08/11/22 Yes Sherwood Gambler, MD  acetaminophen (TYLENOL) 650 MG CR tablet Take 1,300 mg by mouth daily as needed for pain.     [provider]  amLODipine (NORVASC) 5 MG tablet Take 5 mg by mouth daily. Patient not taking: Reported on 03/17/2022 11/14/17   [provider]  Calcium Carbonate-Vit D-Min (CALTRATE 600+D PLUS PO) Take 1 tablet by mouth daily.    [provider]  escitalopram (LEXAPRO) 10 MG tablet Take 10 mg by mouth at bedtime.     [provider]  Fluticasone-Salmeterol (ADVAIR DISKUS) 250-50 MCG/DOSE AEPB Inhale 1 puff into the lungs 2 (two) times a day. Patient not taking: Reported on 03/17/2022 04/27/19   [name], MD  LORazepam (ATIVAN) 0.5 MG tablet Take 0.5 mg by mouth 2 (two) times daily as needed for anxiety. 03/13/22   [provider]  MAGNESIUM CARBONATE PO Take 1 tablet by mouth daily.    [provider]  Multiple Vitamin (MULTIVITAMIN WITH MINERALS) TABS tablet Take 1 tablet by mouth daily.    [provider]  nicotine (NICODERM CQ - DOSED IN MG/24 HOURS) 21 mg/24hr patch Place 1 patch (21 mg total) onto the skin daily. Patient not taking: Reported on 03/17/2022 04/27/19   [name], MD  ondansetron (ZOFRAN ODT) 4 MG disintegrating tablet Take 1 tablet (4 mg total) by mouth every 8 (eight) hours as needed for nausea or vomiting. Patient not taking: Reported on 01/23/2018 01/06/18   [name], [name], MD  ondansetron (ZOFRAN) 4 MG tablet Take 1 tablet (4 mg total) by mouth every 8 (eight) hours as needed for nausea or vomiting. 05/13/22   Tegeler, Gwenyth Allegra, MD  pantoprazole (PROTONIX) 20 MG tablet Take 1 tablet (20 mg total) by mouth daily. 04/27/22   Lacretia Leigh, MD  pantoprazole (PROTONIX) 40 MG tablet Take 40 mg by mouth daily. 10/28/17   [provider]  polycarbophil (FIBERCON) 625 MG tablet Take 625 mg by mouth daily.    [provider]  predniSONE (STERAPRED UNI-PAK 21 TAB) 10 MG (21) TBPK tablet Use per package instruction 03/20/22   Elgergawy, Silver Huguenin, MD  Probiotic Product (ALIGN PO) Take 1 tablet by mouth daily.    [provider]  raloxifene (  EVISTA) 60 MG tablet Take 60 mg by mouth daily. 12/30/17   [provider]  senna-docusate (SENOKOT-S) 8.6-50 MG tablet Take 1 tablet by mouth daily. 04/28/19   [name], MD  simvastatin (ZOCOR) 20 MG tablet Take 20 mg by mouth at bedtime.     [provider]  sucralfate (CARAFATE) 1 g tablet Take 1 g by mouth 3 (three) times daily as needed. 03/13/22   [provider]  umeclidinium bromide (INCRUSE ELLIPTA) 62.5 MCG/INH AEPB Inhale 1 puff into the lungs daily. Patient not taking: Reported on 03/17/2022 04/28/19   [name], MD  vitamin B-12 (CYANOCOBALAMIN) 1000 MCG tablet Take 1  tablet (1,000 mcg total) by mouth daily. Patient not taking: Reported on 03/17/2022 04/27/19   [name], MD      Allergies    Ciprofloxacin, Nitrofuran derivatives, and Sulfa antibiotics    Review of Systems   Review of Systems  Constitutional:  Positive for unexpected weight change.  Gastrointestinal:  Positive for constipation. Negative for abdominal pain, blood in stool and vomiting.    Physical Exam Updated Vital Signs BP 109/69 (BP Location: Right Arm)   Pulse 91   Temp 97.9 F (36.6 C) (Oral)   Resp 20   Ht 5' (1.524 m)   Wt 39.9 kg   SpO2 100%   BMI 17.19 kg/m  Physical Exam Vitals and nursing note reviewed. Exam conducted with a chaperone present.  Constitutional:      Appearance: She is well-developed.  HENT:     Head: Normocephalic and atraumatic.  Cardiovascular:     Rate and Rhythm: Normal rate and regular rhythm.     Heart sounds: Normal heart sounds.  Pulmonary:     Effort: Pulmonary effort is normal.     Breath sounds: Normal breath sounds.  Abdominal:     Palpations: Abdomen is soft.     Tenderness: There is no abdominal tenderness.  Genitourinary:    Comments: No external hemorrhoids.  No gross blood on rectal exam.  There is some stool though no stool balls. Skin:    General: Skin is warm and dry.  Neurological:     Mental Status: She is alert.    ED Results / Procedures / Treatments   Labs (all labs ordered are listed, but only abnormal results are displayed) Labs Reviewed  COMPREHENSIVE METABOLIC PANEL - Abnormal; Notable for the following components:      Result Value   Sodium 126 (*)    Chloride 86 (*)    Glucose, Bld 100 (*)    All other components within normal limits  CBC WITH DIFFERENTIAL/PLATELET - Abnormal; Notable for the following components:   MCH 34.1 (*)    All other components within normal limits    EKG EKG Interpretation  Date/Time:  Saturday August 04 2022 11:38:31 EDT Ventricular Rate:  97 PR  Interval:  175 QRS Duration: 123 QT Interval:  368 QTC Calculation: 468 R Axis:   -24 Text Interpretation: Sinus tachycardia Multiform ventricular premature complexes Left bundle branch block Confirmed by Sherwood Gambler [phone]) on 08/04/2022 11:41:25 AM  Radiology No results found.  Procedures Procedures    Medications Ordered in ED Medications  lactated ringers bolus 500 mL (0 mLs Intravenous Stopped 08/04/22 1350)  mineral oil enema 1 enema (1 enema Rectal Given 08/04/22 1249)  LORazepam (ATIVAN) tablet 1 mg (1 mg Oral Given 08/04/22 1326)    ED Course/ Medical Decision Making/ A&P  Medical Decision Making Amount and/or Complexity of Data Reviewed Labs: ordered.    Details: Sodium of 126 and chloride of 86 her lower than her most recent baseline and consistent with hyponatremia that she had back in April 2023.  Risk OTC drugs. Prescription drug management.   Abdominal exam is benign. Had a CT a couple months ago, no masses or concerning findings. Without pain or vomiting, I don't think imaging is warranted. Given an enema and had relief with a bowel movement. Labs show hyponatremia. 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 I suspect today is similar, likely from water intake but little food intake. Discussed we could admit her, but she declines. Is not altered. Thus we decided to give sodium chloride tablets, be cautious about extra water intake and try to get more salt on food. Needs PCP to recheck labs next week. Discussed she and family need to have low threshold to return for any new/worsening symptoms.        Final Clinical Impression(s) / ED Diagnoses Final diagnoses:  Constipation, unspecified constipation type  Hyponatremia    Rx / DC Orders ED Discharge Orders          Ordered    sodium chloride 1 g tablet  3 times daily with meals        08/04/22 1355               Sherwood Gambler, MD 08/04/22 1510

## 2022-08-04 NOTE — ED Notes (Signed)
Bedside Commode placed in room, Mineral Oil Enema administered per ED orders. Pt tolerated procedure well. Family at bedside with client. Awaiting results of enema

## 2022-08-04 NOTE — Progress Notes (Signed)
Pt with cold extremities making it difficult to maintain a SpO2 pleth. With a good pleth pt SpO2 100% on her home 3L. Verbal order received from Dr. Regenia Skeeter that spot checks may be performed and probe can be turned off at this time. RT will continue to monitor and be available as needed. RN aware of changes.

## 2022-08-04 NOTE — ED Triage Notes (Signed)
Pt arrives pov with daughter, to triage in wheelchair, c/o c/o rectal pain x 1 week, hx of hemorrhoids. Denies rectal bleeding, endorses constipation. PT on continuous 3L o2

## 2022-08-09 DIAGNOSIS — E871 Hypo-osmolality and hyponatremia: Secondary | ICD-10-CM | POA: Diagnosis not present

## 2022-08-24 DIAGNOSIS — F411 Generalized anxiety disorder: Secondary | ICD-10-CM | POA: Diagnosis not present

## 2022-08-24 DIAGNOSIS — F331 Major depressive disorder, recurrent, moderate: Secondary | ICD-10-CM | POA: Diagnosis not present

## 2022-08-27 ENCOUNTER — Institutional Professional Consult (permissible substitution): Payer: Medicare Other | Admitting: Pulmonary Disease

## 2022-08-30 ENCOUNTER — Other Ambulatory Visit: Payer: Self-pay | Admitting: Family Medicine

## 2022-08-30 DIAGNOSIS — R413 Other amnesia: Secondary | ICD-10-CM

## 2022-09-03 ENCOUNTER — Encounter: Payer: Self-pay | Admitting: Pulmonary Disease

## 2022-09-03 ENCOUNTER — Ambulatory Visit (INDEPENDENT_AMBULATORY_CARE_PROVIDER_SITE_OTHER): Payer: Medicare Other | Admitting: Pulmonary Disease

## 2022-09-03 VITALS — BP 106/58 | HR 67 | Temp 98.4°F | Ht 60.0 in | Wt 82.2 lb

## 2022-09-03 DIAGNOSIS — J441 Chronic obstructive pulmonary disease with (acute) exacerbation: Secondary | ICD-10-CM | POA: Diagnosis not present

## 2022-09-03 DIAGNOSIS — J449 Chronic obstructive pulmonary disease, unspecified: Secondary | ICD-10-CM

## 2022-09-03 DIAGNOSIS — R634 Abnormal weight loss: Secondary | ICD-10-CM | POA: Insufficient documentation

## 2022-09-03 DIAGNOSIS — Z720 Tobacco use: Secondary | ICD-10-CM | POA: Diagnosis not present

## 2022-09-03 DIAGNOSIS — R1310 Dysphagia, unspecified: Secondary | ICD-10-CM

## 2022-09-03 DIAGNOSIS — J9611 Chronic respiratory failure with hypoxia: Secondary | ICD-10-CM | POA: Diagnosis not present

## 2022-09-03 MED ORDER — ALBUTEROL (5 MG/ML) CONTINUOUS INHALATION SOLN: 5.0000 mg/h | INHALATION_SOLUTION | RESPIRATORY_TRACT | Status: DC

## 2022-09-03 MED ORDER — TRELEGY ELLIPTA 100-62.5-25 MCG/ACT IN AEPB: 28.0000 | INHALATION_SPRAY | Freq: Every day | RESPIRATORY_TRACT | 0 refills | Status: DC

## 2022-09-03 NOTE — Assessment & Plan Note (Signed)
Multiple risk factors here including -Severe emphysema -Dentures -Depression and anxiety -We will investigate for dysphagia with an esophagram  She does not seem to have malignancy since nodule infiltrates are resolved on CT scan

## 2022-09-03 NOTE — Assessment & Plan Note (Addendum)
Severe emphysema is noted on CT. We can attempt PFTs but she does not have significant effort and I doubt she will be able to perform. We will switch her from Advair and Incruse to Trelegy, we gave her a sample today and granddaughter will call back if this works for prescription  We will provide her with a nebulizer and albuterol nebs to use for rescue

## 2022-09-03 NOTE — Patient Instructions (Addendum)
X Amb sat to qualify  X Sample of trelegy 100 1 puff daily - take INSTEAD of advair + Incruse  X Rx for nebuliser x1 + Albuterol nebs q6h prn as needed  X Esophagram for difficulty swallowing

## 2022-09-03 NOTE — Progress Notes (Signed)
Subjective:    Patient ID: Carol [name], female    DOB: December 06, 1935, 86 y.o.   MRN: [id number]  HPI  Chief Complaint  Patient presents with   Consult    Pt having SOB x 6 months. Pt using 3L's of oxygen 24 hours a day.   86 year old smoker presents to establish care for chronic hypoxic respiratory failure and emphysema after hospital visit  PMH - chronic anxiety/depression, GERD, hyperlipidemia, hypertension  Adm 02/2022 for COPD exacerbation and pneumonia.  Subpleural nodular densities were noted.  She was discharged on oxygen she was also noted to be hyponatremic.  Since then her family always has a caregiver around her.  She has been compliant with her oxygen 24/7, prior to this she was only using oxygen as needed. She is lost significant weight from 109 pounds 2 years ago to her current weight of 82 pounds.  She is being treated for depression and severe anxiety with BuSpar, Effexor and lorazepam. She is maintained on Advair and Incruse.  Granddaughter Loma Sousa accompanies and she reports swallowing problems especially to solid food.  They have been trying nutritional supplements but her weight has not increased  I have reviewed hospital records and prior imaging I obtained additional history from granddaughter  Significant tests/ events reviewed  Ambulatory saturation 08/2022 started at 92% on room air, dropped to 78% on ambulation and required 3 L to maintain 93%  06/2022 CT chest without contrast >> extensive emphysema, prior nodular infiltrates and small right pleural effusion resolved , mild pericardial effusion slight increased  02/2022 CT chest significant for sup pleural opacities  Past Medical History:  Diagnosis Date   Anxiety    Arthritis    arthritis -hips   Colitis    Complication of anesthesia    B/P dropped post colonoscopy x1   GERD (gastroesophageal reflux disease)    Headache(784.0)    migraines rare occ.   Hypertension    Urinary tract infection  early aug 2014    Past Surgical History:  Procedure Laterality Date   CATARACT EXTRACTION, BILATERAL Bilateral    COLONOSCOPY WITH PROPOFOL N/A 08/03/2013   Procedure: COLONOSCOPY WITH PROPOFOL;  Surgeon: Winfield Cunas., MD;  Location: [facility];  Service: Endoscopy;  Laterality: N/A;  ultra thin colon scope   COLONOSCOPY WITH PROPOFOL N/A 02/04/2018   Procedure: COLONOSCOPY WITH PROPOFOL;  Surgeon: Laurence Spates, MD;  Location: [facility];  Service: Endoscopy;  Laterality: N/A;   LASIK      Allergies  Allergen Reactions   Ciprofloxacin Nausea Only and Other (See Comments)    Nausea and legs weak   Nitrofuran Derivatives Nausea Only and Other (See Comments)    nitrofuratonin bad nausea, legs weak   Penicillins Other (See Comments)    Sick on stomach   Sulfa Antibiotics Other (See Comments)    jittery    Social History   Socioeconomic History   Marital status: Widowed    Spouse name: Not on file   Number of children: Not on file   Years of education: Not on file   Highest education level: Not on file  Occupational History   Not on file  Tobacco Use   Smoking status: Every Day    Packs/day: 0.25    Years: 40.00    Total pack years: 10.00    Types: Cigarettes   Smokeless tobacco: Never   Tobacco comments:    Pt states she smoke 3 ciggs at most daily. 09/03/22 LW  Vaping  Use   Vaping Use: Never used  Substance and Sexual Activity   Alcohol use: No   Drug use: No   Sexual activity: Not Currently  Other Topics Concern   Not on file  Social History Narrative   Not on file   Social Determinants of Health   Financial Resource Strain: Not on file  Food Insecurity: Not on file  Transportation Needs: Not on file  Physical Activity: Not on file  Stress: Not on file  Social Connections: Not on file  Intimate Partner Violence: Not on file    History reviewed. No pertinent family history.    Review of Systems Shortness of breath with  activity Indigestion loss of appetite Weight loss Abdominal pain Difficulty swallowing Daughter problems Nasal congestion Anxiety and depression Foot swelling    Objective:   Physical Exam  Gen. Pleasant, thin, frail, elderly woman, in no distress, normal affect ENT - no pallor,icterus, no post nasal drip Neck: No JVD, no thyromegaly, no carotid bruits Lungs: no use of accessory muscles, no dullness to percussion, decreased BS BL  without rales or rhonchi  Cardiovascular: Rhythm regular, heart sounds  normal, no murmurs or gallops, no peripheral edema Abdomen: soft and non-tender, no hepatosplenomegaly, BS normal. Musculoskeletal: No deformities, no cyanosis or clubbing Neuro:  alert, non focal, mild tremors +       Assessment & Plan:

## 2022-09-03 NOTE — Assessment & Plan Note (Signed)
She does seem to require 3 L of oxygen on walking. We will have to check on POC if she qualifies before we get her a smaller concentrator She can stay off oxygen while at rest but will needed on ambulation and during sleep  We discussed vaccinations including flu shot

## 2022-09-03 NOTE — Assessment & Plan Note (Signed)
Smoking cessation was emphasized is the most important intervention, she is cut down to 1 to 2 cigarettes/day

## 2022-09-06 ENCOUNTER — Encounter (HOSPITAL_COMMUNITY): Payer: Self-pay | Admitting: *Deleted

## 2022-09-06 ENCOUNTER — Inpatient Hospital Stay (HOSPITAL_COMMUNITY): Payer: Medicare Other

## 2022-09-06 ENCOUNTER — Emergency Department (HOSPITAL_COMMUNITY): Payer: Medicare Other

## 2022-09-06 ENCOUNTER — Inpatient Hospital Stay (HOSPITAL_COMMUNITY)
Admission: EM | Admit: 2022-09-06 | Discharge: 2022-09-15 | DRG: 070 | Disposition: A | Discharge status: Skilled Nursing Facility | Payer: Medicare Other | Attending: Internal Medicine | Admitting: Internal Medicine

## 2022-09-06 ENCOUNTER — Other Ambulatory Visit: Payer: Self-pay

## 2022-09-06 DIAGNOSIS — I10 Essential (primary) hypertension: Secondary | ICD-10-CM | POA: Diagnosis present

## 2022-09-06 DIAGNOSIS — R0902 Hypoxemia: Secondary | ICD-10-CM | POA: Diagnosis not present

## 2022-09-06 DIAGNOSIS — R570 Cardiogenic shock: Secondary | ICD-10-CM | POA: Diagnosis not present

## 2022-09-06 DIAGNOSIS — I2489 Other forms of acute ischemic heart disease: Secondary | ICD-10-CM | POA: Diagnosis present

## 2022-09-06 DIAGNOSIS — H9190 Unspecified hearing loss, unspecified ear: Secondary | ICD-10-CM | POA: Diagnosis present

## 2022-09-06 DIAGNOSIS — R7989 Other specified abnormal findings of blood chemistry: Secondary | ICD-10-CM

## 2022-09-06 DIAGNOSIS — E43 Unspecified severe protein-calorie malnutrition: Secondary | ICD-10-CM | POA: Diagnosis present

## 2022-09-06 DIAGNOSIS — Z7189 Other specified counseling: Secondary | ICD-10-CM | POA: Diagnosis not present

## 2022-09-06 DIAGNOSIS — R4182 Altered mental status, unspecified: Secondary | ICD-10-CM | POA: Diagnosis not present

## 2022-09-06 DIAGNOSIS — F17210 Nicotine dependence, cigarettes, uncomplicated: Secondary | ICD-10-CM | POA: Diagnosis present

## 2022-09-06 DIAGNOSIS — E782 Mixed hyperlipidemia: Secondary | ICD-10-CM | POA: Diagnosis not present

## 2022-09-06 DIAGNOSIS — I5020 Unspecified systolic (congestive) heart failure: Secondary | ICD-10-CM | POA: Diagnosis not present

## 2022-09-06 DIAGNOSIS — Z7982 Long term (current) use of aspirin: Secondary | ICD-10-CM

## 2022-09-06 DIAGNOSIS — R2689 Other abnormalities of gait and mobility: Secondary | ICD-10-CM | POA: Diagnosis not present

## 2022-09-06 DIAGNOSIS — E46 Unspecified protein-calorie malnutrition: Secondary | ICD-10-CM | POA: Diagnosis not present

## 2022-09-06 DIAGNOSIS — Z96659 Presence of unspecified artificial knee joint: Secondary | ICD-10-CM | POA: Diagnosis present

## 2022-09-06 DIAGNOSIS — R1310 Dysphagia, unspecified: Secondary | ICD-10-CM | POA: Diagnosis not present

## 2022-09-06 DIAGNOSIS — I5023 Acute on chronic systolic (congestive) heart failure: Secondary | ICD-10-CM | POA: Diagnosis not present

## 2022-09-06 DIAGNOSIS — Z8701 Personal history of pneumonia (recurrent): Secondary | ICD-10-CM

## 2022-09-06 DIAGNOSIS — R001 Bradycardia, unspecified: Secondary | ICD-10-CM | POA: Diagnosis not present

## 2022-09-06 DIAGNOSIS — Z881 Allergy status to other antibiotic agents status: Secondary | ICD-10-CM

## 2022-09-06 DIAGNOSIS — E876 Hypokalemia: Secondary | ICD-10-CM | POA: Diagnosis present

## 2022-09-06 DIAGNOSIS — G43909 Migraine, unspecified, not intractable, without status migrainosus: Secondary | ICD-10-CM | POA: Diagnosis present

## 2022-09-06 DIAGNOSIS — G9341 Metabolic encephalopathy: Principal | ICD-10-CM | POA: Diagnosis present

## 2022-09-06 DIAGNOSIS — Z681 Body mass index (BMI) 19 or less, adult: Secondary | ICD-10-CM

## 2022-09-06 DIAGNOSIS — R55 Syncope and collapse: Secondary | ICD-10-CM | POA: Diagnosis not present

## 2022-09-06 DIAGNOSIS — D649 Anemia, unspecified: Secondary | ICD-10-CM | POA: Diagnosis not present

## 2022-09-06 DIAGNOSIS — R571 Hypovolemic shock: Secondary | ICD-10-CM

## 2022-09-06 DIAGNOSIS — R779 Abnormality of plasma protein, unspecified: Secondary | ICD-10-CM | POA: Diagnosis not present

## 2022-09-06 DIAGNOSIS — I2510 Atherosclerotic heart disease of native coronary artery without angina pectoris: Secondary | ICD-10-CM | POA: Diagnosis not present

## 2022-09-06 DIAGNOSIS — R109 Unspecified abdominal pain: Secondary | ICD-10-CM | POA: Diagnosis present

## 2022-09-06 DIAGNOSIS — J439 Emphysema, unspecified: Secondary | ICD-10-CM | POA: Diagnosis not present

## 2022-09-06 DIAGNOSIS — I2584 Coronary atherosclerosis due to calcified coronary lesion: Secondary | ICD-10-CM | POA: Diagnosis not present

## 2022-09-06 DIAGNOSIS — K219 Gastro-esophageal reflux disease without esophagitis: Secondary | ICD-10-CM | POA: Diagnosis present

## 2022-09-06 DIAGNOSIS — Z66 Do not resuscitate: Secondary | ICD-10-CM | POA: Diagnosis present

## 2022-09-06 DIAGNOSIS — Z1152 Encounter for screening for COVID-19: Secondary | ICD-10-CM

## 2022-09-06 DIAGNOSIS — F411 Generalized anxiety disorder: Secondary | ICD-10-CM | POA: Diagnosis present

## 2022-09-06 DIAGNOSIS — Z9981 Dependence on supplemental oxygen: Secondary | ICD-10-CM

## 2022-09-06 DIAGNOSIS — F32A Depression, unspecified: Secondary | ICD-10-CM | POA: Diagnosis not present

## 2022-09-06 DIAGNOSIS — I429 Cardiomyopathy, unspecified: Secondary | ICD-10-CM | POA: Diagnosis not present

## 2022-09-06 DIAGNOSIS — Z79899 Other long term (current) drug therapy: Secondary | ICD-10-CM | POA: Insufficient documentation

## 2022-09-06 DIAGNOSIS — J9621 Acute and chronic respiratory failure with hypoxia: Secondary | ICD-10-CM | POA: Diagnosis not present

## 2022-09-06 DIAGNOSIS — Z880 Allergy status to penicillin: Secondary | ICD-10-CM

## 2022-09-06 DIAGNOSIS — G8929 Other chronic pain: Secondary | ICD-10-CM | POA: Diagnosis present

## 2022-09-06 DIAGNOSIS — Z741 Need for assistance with personal care: Secondary | ICD-10-CM | POA: Diagnosis not present

## 2022-09-06 DIAGNOSIS — R627 Adult failure to thrive: Secondary | ICD-10-CM | POA: Diagnosis present

## 2022-09-06 DIAGNOSIS — E162 Hypoglycemia, unspecified: Secondary | ICD-10-CM | POA: Diagnosis present

## 2022-09-06 DIAGNOSIS — I447 Left bundle-branch block, unspecified: Secondary | ICD-10-CM | POA: Diagnosis present

## 2022-09-06 DIAGNOSIS — J449 Chronic obstructive pulmonary disease, unspecified: Secondary | ICD-10-CM | POA: Diagnosis present

## 2022-09-06 DIAGNOSIS — I959 Hypotension, unspecified: Secondary | ICD-10-CM | POA: Diagnosis not present

## 2022-09-06 DIAGNOSIS — F05 Delirium due to known physiological condition: Secondary | ICD-10-CM | POA: Diagnosis not present

## 2022-09-06 DIAGNOSIS — Z634 Disappearance and death of family member: Secondary | ICD-10-CM

## 2022-09-06 DIAGNOSIS — Z888 Allergy status to other drugs, medicaments and biological substances status: Secondary | ICD-10-CM

## 2022-09-06 DIAGNOSIS — M6281 Muscle weakness (generalized): Secondary | ICD-10-CM | POA: Diagnosis not present

## 2022-09-06 DIAGNOSIS — R41841 Cognitive communication deficit: Secondary | ICD-10-CM | POA: Diagnosis not present

## 2022-09-06 DIAGNOSIS — J432 Centrilobular emphysema: Secondary | ICD-10-CM | POA: Diagnosis present

## 2022-09-06 DIAGNOSIS — I700 Atherosclerosis of aorta: Secondary | ICD-10-CM | POA: Diagnosis present

## 2022-09-06 DIAGNOSIS — Z515 Encounter for palliative care: Secondary | ICD-10-CM | POA: Diagnosis not present

## 2022-09-06 DIAGNOSIS — E871 Hypo-osmolality and hyponatremia: Secondary | ICD-10-CM | POA: Diagnosis present

## 2022-09-06 DIAGNOSIS — R54 Age-related physical debility: Secondary | ICD-10-CM | POA: Diagnosis present

## 2022-09-06 DIAGNOSIS — Z882 Allergy status to sulfonamides status: Secondary | ICD-10-CM

## 2022-09-06 DIAGNOSIS — R64 Cachexia: Secondary | ICD-10-CM | POA: Diagnosis not present

## 2022-09-06 DIAGNOSIS — N179 Acute kidney failure, unspecified: Secondary | ICD-10-CM | POA: Diagnosis not present

## 2022-09-06 DIAGNOSIS — I25118 Atherosclerotic heart disease of native coronary artery with other forms of angina pectoris: Secondary | ICD-10-CM | POA: Diagnosis present

## 2022-09-06 DIAGNOSIS — R000 Tachycardia, unspecified: Secondary | ICD-10-CM | POA: Diagnosis not present

## 2022-09-06 DIAGNOSIS — E785 Hyperlipidemia, unspecified: Secondary | ICD-10-CM | POA: Diagnosis not present

## 2022-09-06 DIAGNOSIS — D539 Nutritional anemia, unspecified: Secondary | ICD-10-CM | POA: Diagnosis present

## 2022-09-06 DIAGNOSIS — I110 Hypertensive heart disease with heart failure: Secondary | ICD-10-CM | POA: Diagnosis not present

## 2022-09-06 DIAGNOSIS — T424X1A Poisoning by benzodiazepines, accidental (unintentional), initial encounter: Secondary | ICD-10-CM | POA: Diagnosis present

## 2022-09-06 DIAGNOSIS — M6259 Muscle wasting and atrophy, not elsewhere classified, multiple sites: Secondary | ICD-10-CM | POA: Diagnosis not present

## 2022-09-06 DIAGNOSIS — K224 Dyskinesia of esophagus: Secondary | ICD-10-CM | POA: Diagnosis not present

## 2022-09-06 DIAGNOSIS — Z7951 Long term (current) use of inhaled steroids: Secondary | ICD-10-CM

## 2022-09-06 DIAGNOSIS — Z87440 Personal history of urinary (tract) infections: Secondary | ICD-10-CM

## 2022-09-06 DIAGNOSIS — Z716 Tobacco abuse counseling: Secondary | ICD-10-CM

## 2022-09-06 DIAGNOSIS — G928 Other toxic encephalopathy: Secondary | ICD-10-CM | POA: Diagnosis present

## 2022-09-06 LAB — URINALYSIS, ROUTINE W REFLEX MICROSCOPIC
Bacteria, UA: NONE SEEN
Bilirubin Urine: NEGATIVE
Glucose, UA: NEGATIVE mg/dL
Hgb urine dipstick: NEGATIVE
Ketones, ur: NEGATIVE mg/dL
Leukocytes,Ua: NEGATIVE
Nitrite: NEGATIVE
Protein, ur: 30 mg/dL — AB
Specific Gravity, Urine: 1.009 (ref 1.005–1.030)
pH: 7 (ref 5.0–8.0)

## 2022-09-06 LAB — CBC WITH DIFFERENTIAL/PLATELET
Abs Immature Granulocytes: 0.04 10*3/uL (ref 0.00–0.07)
Basophils Absolute: 0 10*3/uL (ref 0.0–0.1)
Basophils Relative: 0 %
Eosinophils Absolute: 0 10*3/uL (ref 0.0–0.5)
Eosinophils Relative: 0 %
HCT: 32.3 % — ABNORMAL LOW (ref 36.0–46.0)
Hemoglobin: 11.1 g/dL — ABNORMAL LOW (ref 12.0–15.0)
Immature Granulocytes: 1 %
Lymphocytes Relative: 22 %
Lymphs Abs: 1.9 10*3/uL (ref 0.7–4.0)
MCH: 34.7 pg — ABNORMAL HIGH (ref 26.0–34.0)
MCHC: 34.4 g/dL (ref 30.0–36.0)
MCV: 100.9 fL — ABNORMAL HIGH (ref 80.0–100.0)
Monocytes Absolute: 0.6 10*3/uL (ref 0.1–1.0)
Monocytes Relative: 7 %
Neutro Abs: 6.2 10*3/uL (ref 1.7–7.7)
Neutrophils Relative %: 70 %
Platelets: 193 10*3/uL (ref 150–400)
RBC: 3.2 MIL/uL — ABNORMAL LOW (ref 3.87–5.11)
RDW: 12.5 % (ref 11.5–15.5)
WBC: 8.7 10*3/uL (ref 4.0–10.5)
nRBC: 0 % (ref 0.0–0.2)

## 2022-09-06 LAB — COMPREHENSIVE METABOLIC PANEL
ALT: 14 U/L (ref 0–44)
AST: 14 U/L — ABNORMAL LOW (ref 15–41)
Albumin: 3 g/dL — ABNORMAL LOW (ref 3.5–5.0)
Alkaline Phosphatase: 34 U/L — ABNORMAL LOW (ref 38–126)
Anion gap: 12 (ref 5–15)
BUN: 10 mg/dL (ref 8–23)
CO2: 27 mmol/L (ref 22–32)
Calcium: 9 mg/dL (ref 8.9–10.3)
Chloride: 94 mmol/L — ABNORMAL LOW (ref 98–111)
Creatinine, Ser: 1.04 mg/dL — ABNORMAL HIGH (ref 0.44–1.00)
GFR, Estimated: 53 mL/min — ABNORMAL LOW (ref >60)
Glucose, Bld: 178 mg/dL — ABNORMAL HIGH (ref 70–99)
Potassium: 3.3 mmol/L — ABNORMAL LOW (ref 3.5–5.1)
Sodium: 133 mmol/L — ABNORMAL LOW (ref 135–145)
Total Bilirubin: 0.7 mg/dL (ref 0.3–1.2)
Total Protein: 5 g/dL — ABNORMAL LOW (ref 6.5–8.1)

## 2022-09-06 LAB — ECHOCARDIOGRAM COMPLETE
AR max vel: 2.33 cm2
AV Area VTI: 2.02 cm2
AV Area mean vel: 2.26 cm2
AV Mean grad: 4.5 mmHg
AV Peak grad: 8.3 mmHg
Ao pk vel: 1.44 m/s
Height: 60 in
Weight: 1315.71 oz

## 2022-09-06 LAB — TROPONIN I (HIGH SENSITIVITY)
Troponin I (High Sensitivity): 20 ng/L — ABNORMAL HIGH (ref <18)
Troponin I (High Sensitivity): 29 ng/L — ABNORMAL HIGH (ref <18)

## 2022-09-06 LAB — RAPID URINE DRUG SCREEN, HOSP PERFORMED
Amphetamines: NOT DETECTED
Barbiturates: NOT DETECTED
Benzodiazepines: POSITIVE — AB
Cocaine: NOT DETECTED
Opiates: NOT DETECTED
Tetrahydrocannabinol: NOT DETECTED

## 2022-09-06 LAB — GLUCOSE, CAPILLARY
Glucose-Capillary: 72 mg/dL (ref 70–99)
Glucose-Capillary: 82 mg/dL (ref 70–99)

## 2022-09-06 LAB — LACTIC ACID, PLASMA
Lactic Acid, Venous: 2.2 mmol/L (ref 0.5–1.9)
Lactic Acid, Venous: 1.6 mmol/L (ref 0.5–1.9)

## 2022-09-06 LAB — PROTIME-INR
INR: 1.2 (ref 0.8–1.2)
Prothrombin Time: 15.1 seconds (ref 11.4–15.2)

## 2022-09-06 LAB — SARS CORONAVIRUS 2 BY RT PCR: SARS Coronavirus 2 by RT PCR: NEGATIVE

## 2022-09-06 LAB — AMMONIA: Ammonia: <10 umol/L (ref 9–35)

## 2022-09-06 LAB — MRSA NEXT GEN BY PCR, NASAL: MRSA by PCR Next Gen: DETECTED — AB

## 2022-09-06 MED ORDER — ORAL CARE MOUTH RINSE: 15.0000 mL | OROMUCOSAL | Status: DC | PRN

## 2022-09-06 MED ORDER — HALOPERIDOL LACTATE 5 MG/ML IJ SOLN
2.0000 mg | Freq: Once | INTRAMUSCULAR | Status: AC
  Administered 2022-09-06: 2 mg via INTRAVENOUS
  Filled 2022-09-06: qty 1

## 2022-09-06 MED ORDER — PHENYLEPHRINE HCL-NACL 20-0.9 MG/250ML-% IV SOLN: 0.0000 ug/min | INTRAVENOUS | Status: DC

## 2022-09-06 MED ORDER — ORAL CARE MOUTH RINSE
15.0000 mL | OROMUCOSAL | Status: DC
  Administered 2022-09-07 (×3): 15 mL via OROMUCOSAL

## 2022-09-06 MED ORDER — HALOPERIDOL LACTATE 5 MG/ML IJ SOLN
2.0000 mg | Freq: Four times a day (QID) | INTRAMUSCULAR | Status: DC | PRN
  Administered 2022-09-06 – 2022-09-12 (×3): 2 mg via INTRAVENOUS
  Filled 2022-09-06 (×3): qty 1

## 2022-09-06 MED ORDER — LACTATED RINGERS IV SOLN: INTRAVENOUS | Status: DC

## 2022-09-06 MED ORDER — BUDESONIDE 0.5 MG/2ML IN SUSP
0.5000 mg | Freq: Two times a day (BID) | RESPIRATORY_TRACT | Status: DC
  Administered 2022-09-06 – 2022-09-07 (×3): 0.5 mg via RESPIRATORY_TRACT
  Filled 2022-09-06 (×3): qty 2

## 2022-09-06 MED ORDER — PANTOPRAZOLE SODIUM 40 MG IV SOLR
40.0000 mg | INTRAVENOUS | Status: DC
  Administered 2022-09-06 – 2022-09-08 (×3): 40 mg via INTRAVENOUS
  Filled 2022-09-06 (×2): qty 10

## 2022-09-06 MED ORDER — LACTATED RINGERS IV BOLUS
1000.0000 mL | Freq: Once | INTRAVENOUS | Status: AC
  Administered 2022-09-06: 1000 mL via INTRAVENOUS

## 2022-09-06 MED ORDER — SODIUM CHLORIDE 0.9 % IV SOLN
250.0000 mL | INTRAVENOUS | Status: DC
  Administered 2022-09-06: 250 mL via INTRAVENOUS

## 2022-09-06 MED ORDER — NOREPINEPHRINE 4 MG/250ML-% IV SOLN
INTRAVENOUS | Status: AC
  Administered 2022-09-06: 2 mg
  Filled 2022-09-06: qty 250

## 2022-09-06 MED ORDER — IPRATROPIUM-ALBUTEROL 0.5-2.5 (3) MG/3ML IN SOLN
3.0000 mL | Freq: Four times a day (QID) | RESPIRATORY_TRACT | Status: DC
  Administered 2022-09-06 – 2022-09-07 (×5): 3 mL via RESPIRATORY_TRACT
  Filled 2022-09-06 (×5): qty 3

## 2022-09-06 MED ORDER — DEXMEDETOMIDINE HCL IN NACL 400 MCG/100ML IV SOLN
0.4000 ug/kg/h | INTRAVENOUS | Status: DC
  Administered 2022-09-06: 0.4 ug/kg/h via INTRAVENOUS
  Filled 2022-09-06 (×2): qty 100

## 2022-09-06 MED ORDER — NICOTINE 21 MG/24HR TD PT24
21.0000 mg | MEDICATED_PATCH | Freq: Every day | TRANSDERMAL | Status: DC
  Administered 2022-09-06 – 2022-09-15 (×7): 21 mg via TRANSDERMAL
  Filled 2022-09-06 (×9): qty 1

## 2022-09-06 MED ORDER — DEXTROSE 10 % IV SOLN: INTRAVENOUS | Status: DC

## 2022-09-06 MED ORDER — NOREPINEPHRINE 4 MG/250ML-% IV SOLN
2.0000 ug/min | INTRAVENOUS | Status: DC
  Administered 2022-09-06: 4 ug/min via INTRAVENOUS

## 2022-09-06 MED ORDER — POTASSIUM CHLORIDE 10 MEQ/100ML IV SOLN
10.0000 meq | INTRAVENOUS | Status: AC
  Administered 2022-09-06 (×4): 10 meq via INTRAVENOUS
  Filled 2022-09-06 (×4): qty 100

## 2022-09-06 MED ORDER — CHLORHEXIDINE GLUCONATE CLOTH 2 % EX PADS
6.0000 | MEDICATED_PAD | Freq: Every day | CUTANEOUS | Status: DC
  Administered 2022-09-06 – 2022-09-15 (×6): 6 via TOPICAL

## 2022-09-06 NOTE — ED Notes (Signed)
C-t notified of  the pt ready   for her c-t  head

## 2022-09-06 NOTE — ED Notes (Signed)
Verbal order from dr Roxanne Mins for insertion of the foley catheter at 0150am

## 2022-09-06 NOTE — H&P (Signed)
NAME:  Carol [name], MRN:  [id number], DOB:  12-12-1935, LOS: 0 ADMISSION DATE:  09/06/2022, CONSULTATION DATE:  10/12 REFERRING MD:  Dr. Roxanne Mins, CHIEF COMPLAINT:  AMS   History of Present Illness:  86 year old female with PMH as below, which is significant for COPD, HTN, hyponatremia, and recent admission for pneumonia in April of this year. Since that time she has grown progressively weak, has struggled with protein calorie malnutrition, and has about a 20 pound unintentional weight loss.  She was in her usual state of health until 10/11 when she developed weakness and confusion. Her daughter was staying with her. Went to bed as usual. Her daughter heard her fall out of bed early AM 11/12. She was not responding. Daughter performed CPR briefly. Unclear if she truly lost pulses. She did have spontaneous circulation when EMS arrived. Upon arrival to the ED she was unresponsive. Family endorsed DNR/DNI status. Hypotensive despite 3 L IVF and started on levophed. Family concerned about benzodiazepine as the patient takes scheduled ativan. UDS positive for only benzo. Other laboratory data significant for Na 133, K 3.3, Lactic acid 2.2, WBC 8.7, Hgb 11.1. PCCM asked to evaluate patient for ICU admission.   Pertinent  Medical History   has a past medical history of Anxiety, Arthritis, Colitis, Complication of anesthesia, GERD (gastroesophageal reflux disease), Headache(784.0), Hypertension, and Urinary tract infection (early aug 2014).   Significant Hospital Events: Including procedures, antibiotic start and stop dates in addition to other pertinent events     Interim History / Subjective:    Objective   Blood pressure (!) 95/51, pulse 85, resp. rate (!) 21, height 5' (1.524 m), weight 37.3 kg, SpO2 99 %.        Intake/Output Summary (Last 24 hours) at 09/06/2022 0523 Last data filed at 09/06/2022 6629 Gross per 24 hour  Intake 2000 ml  Output --  Net 2000 ml   Filed Weights    09/06/22 0058  Weight: 37.3 kg    Examination: General: Cachectic elderly female in NAD HENT: [place]/AT, pupils pinpoint, no JVD Lungs: Clear bilateral breath sounds. No distress.  Cardiovascular: RRR, no MRG Abdomen: Soft, non-tender, non-distended Extremities: No acute deformity Neuro: Grimace to painful stimuli. No response othrwise.  GU: Foley  Resolved Hospital Problem list     Assessment & Plan:   Acute metabolic encephalopathy: Etiology uncertain. CT head pending. UDS positive for benzo, she takes scheduled ativan at home. Doubt sepsis. Hypothermic, but WBC wnl. Urine and chest imaging not indicative of infection. Was totally unresponsive. Grimacing to pain on my exam, so hopefully she is improving. - CT head - Chronic benzo use, so not a great candidate for reversal. Mental status marginally improving. - MIVF - DNR/DNI - Support BP, Await CT, and give her some time to hopefully come around.   Shock: hypovolemia vs medication effect from benzodiazepines. Doubt sepsis. Lactic cleared. - Continue Norepi for MAP goal 65 - s/p 3 L IVF, LR at 125/hr - Echocardiogram  Hypokalemia - Replace K  COPD without acute exacerbation - Scheduled Duoneb, budesonide - No role for systemic steroids.   Acute on chronic hypoxemic respiratory failure - Supplemental O2 to keep sats > 90%  Protein calorie malnutrition - unfortunately need to keep NPO for now   Best Practice (right click and "Reselect all SmartList Selections" daily)   Diet/type: NPO DVT prophylaxis: other on hold pending CT GI prophylaxis: PPI Lines: N/A Foley:  Yes, and it is still needed Code Status:  DNR Last date of multidisciplinary goals of care discussion '[ ]'$   Labs   CBC: Recent Labs  Lab 09/06/22 0104  WBC 8.7  NEUTROABS 6.2  HGB 11.1*  HCT 32.3*  MCV 100.9*  PLT 157    Basic Metabolic Panel: Recent Labs  Lab 09/06/22 0104  NA 133*  K 3.3*  CL 94*  CO2 27  GLUCOSE 178*  BUN 10   CREATININE 1.04*  CALCIUM 9.0   GFR: Estimated Creatinine Clearance: 24.1 mL/min (A) (by C-G formula based on SCr of 1.04 mg/dL (H)). Recent Labs  Lab 09/06/22 0104 09/06/22 0314  WBC 8.7  --   LATICACIDVEN 2.2* 1.6    Liver Function Tests: Recent Labs  Lab 09/06/22 0104  AST 14*  ALT 14  ALKPHOS 34*  BILITOT 0.7  PROT 5.0*  ALBUMIN 3.0*   No results for input(s): "LIPASE", "AMYLASE" in the last 168 hours. No results for input(s): "AMMONIA" in the last 168 hours.  ABG No results found for: "PHART", "PCO2ART", "PO2ART", "HCO3", "TCO2", "ACIDBASEDEF", "O2SAT"   Coagulation Profile: No results for input(s): "INR", "PROTIME" in the last 168 hours.  Cardiac Enzymes: No results for input(s): "CKTOTAL", "CKMB", "CKMBINDEX", "TROPONINI" in the last 168 hours.  HbA1C: No results found for: "HGBA1C"  CBG: No results for input(s): "GLUCAP" in the last 168 hours.  Review of Systems:   Patient is encephalopathic and/or intubated. Therefore history has been obtained from chart review.    Past Medical History:  She,  has a past medical history of Anxiety, Arthritis, Colitis, Complication of anesthesia, GERD (gastroesophageal reflux disease), Headache(784.0), Hypertension, and Urinary tract infection (early aug 2014).   Surgical History:   Past Surgical History:  Procedure Laterality Date   CATARACT EXTRACTION, BILATERAL Bilateral    COLONOSCOPY WITH PROPOFOL N/A 08/03/2013   Procedure: COLONOSCOPY WITH PROPOFOL;  Surgeon: Winfield Cunas., MD;  Location: [facility];  Service: Endoscopy;  Laterality: N/A;  ultra thin colon scope   COLONOSCOPY WITH PROPOFOL N/A 02/04/2018   Procedure: COLONOSCOPY WITH PROPOFOL;  Surgeon: Laurence Spates, MD;  Location: [facility];  Service: Endoscopy;  Laterality: N/A;   LASIK       Social History:   reports that she has been smoking cigarettes. She has a 10.00 pack-year smoking history. She has never used smokeless tobacco. She  reports that she does not drink alcohol and does not use drugs.   Family History:  Her family history is not on file.   Allergies Allergies  Allergen Reactions   Ciprofloxacin Nausea Only and Other (See Comments)    Nausea and legs weak   Nitrofuran Derivatives Nausea Only and Other (See Comments)    nitrofuratonin bad nausea, legs weak   Penicillins Other (See Comments)    Sick on stomach   Sulfa Antibiotics Other (See Comments)    jittery     Home Medications  Prior to Admission medications   Medication Sig Start Date End Date Taking? Authorizing Provider  acetaminophen (TYLENOL) 650 MG CR tablet Take 1,300 mg by mouth daily as needed for pain.     [provider]  amLODipine (NORVASC) 5 MG tablet Take 5 mg by mouth daily. 11/14/17   [provider]  busPIRone (BUSPAR) 5 MG tablet Take 5 mg by mouth 2 (two) times daily. 08/27/22   [provider]  Calcium Carbonate-Vit D-Min (CALTRATE 600+D PLUS PO) Take 1 tablet by mouth daily.    [provider]  escitalopram (LEXAPRO) 10 MG tablet  Take 10 mg by mouth at bedtime.     [provider]  Fluticasone-Salmeterol (ADVAIR DISKUS) 250-50 MCG/DOSE AEPB Inhale 1 puff into the lungs 2 (two) times a day. 04/27/19   [name], MD  Fluticasone-Umeclidin-Vilant (TRELEGY ELLIPTA) 100-62.5-25 MCG/ACT AEPB Inhale 28 each into the lungs daily. 09/03/22   Rigoberto Noel, MD  LORazepam (ATIVAN) 0.5 MG tablet Take 0.5 mg by mouth 2 (two) times daily as needed for anxiety. 03/13/22   [provider]  LORazepam (ATIVAN) 1 MG tablet Take 1 mg by mouth 3 (three) times daily. 08/25/22   [provider]  MAGNESIUM CARBONATE PO Take 1 tablet by mouth daily.    [provider]  Multiple Vitamin (MULTIVITAMIN WITH MINERALS) TABS tablet Take 1 tablet by mouth daily.    [provider]  nicotine (NICODERM CQ - DOSED IN MG/24 HOURS) 21 mg/24hr patch Place 1 patch (21 mg total) onto  the skin daily. 04/27/19   [name], MD  ondansetron (ZOFRAN ODT) 4 MG disintegrating tablet Take 1 tablet (4 mg total) by mouth every 8 (eight) hours as needed for nausea or vomiting. 01/06/18   [name], Ripudeep K, MD  ondansetron (ZOFRAN) 4 MG tablet Take 1 tablet (4 mg total) by mouth every 8 (eight) hours as needed for nausea or vomiting. 05/13/22   Tegeler, Gwenyth Allegra, MD  pantoprazole (PROTONIX) 20 MG tablet Take 1 tablet (20 mg total) by mouth daily. 04/27/22   Lacretia Leigh, MD  pantoprazole (PROTONIX) 40 MG tablet Take 40 mg by mouth daily. 10/28/17   [provider]  polycarbophil (FIBERCON) 625 MG tablet Take 625 mg by mouth daily.    [provider]  Probiotic Product (ALIGN PO) Take 1 tablet by mouth daily.    [provider]  raloxifene (EVISTA) 60 MG tablet Take 60 mg by mouth daily. 12/30/17   [provider]  senna-docusate (SENOKOT-S) 8.6-50 MG tablet Take 1 tablet by mouth daily. 04/28/19   [name], MD  simvastatin (ZOCOR) 20 MG tablet Take 20 mg by mouth at bedtime.     [provider]  sucralfate (CARAFATE) 1 g tablet Take 1 g by mouth 3 (three) times daily as needed. 03/13/22   [provider]  umeclidinium bromide (INCRUSE ELLIPTA) 62.5 MCG/INH AEPB Inhale 1 puff into the lungs daily. 04/28/19   [name], MD  venlafaxine XR (EFFEXOR-XR) 150 MG 24 hr capsule Take 150 mg by mouth every morning. 08/03/22   [provider]  vitamin B-12 (CYANOCOBALAMIN) 1000 MCG tablet Take 1 tablet (1,000 mcg total) by mouth daily. 04/27/19   [name], MD     Critical care time: 38 minutes     Georgann Housekeeper, AGACNP-BC [place] for personal pager PCCM on call pager [phone] until 7pm. Please call Elink 7p-7a. [phone]  09/06/2022 6:22 AM

## 2022-09-06 NOTE — ED Notes (Signed)
The pts rectal temp is below 93  bair hugger  has been placed and a foley cath has been inserted number 14  she has had 2059m of lactated ringers  levo phed drip at 2  it was started at 4 but in 5 monutes her  bp jumped to 120.70  levophed decreased to 2 where it remains  the pulse is not reading anywhere on her body  forehead legs arms fingers and ears cold to the touch  family at thew bedside presently  they think that she is a dnr

## 2022-09-06 NOTE — Progress Notes (Signed)
USGIV placed in R arm per vasopressor consult

## 2022-09-06 NOTE — ED Notes (Addendum)
Pt was aggravated and trying to get out of bed along with trying to pull IV's out of her arm. IV's covered to prevent the pt from pulling them out and medication administered to help pt relax. Will continue to monitor.

## 2022-09-06 NOTE — ED Provider Notes (Signed)
[facility] EMERGENCY DEPARTMENT Provider Note   CSN: [id number] Arrival date & time: 09/06/22  0040     History  Chief Complaint  Patient presents with   post cpr    Carol [name] is a 86 y.o. female.  The history is provided by the EMS personnel. The history is limited by the condition of the patient (Unresponsive).  She has history of hypertension, COPD and is brought in by ambulance following CPR at home.  Daughter reportedly heard her fell out of bed and found her unresponsive and in the hands only CPR and patient started breathing spontaneously.  EMS has not had to do CPR, but it did note that she was hypoxic and she has been given supplemental oxygen.  She is also noted to be hypotensive and has been started on an epinephrine drip.   Home Medications Prior to Admission medications   Medication Sig Start Date End Date Taking? Authorizing Provider  acetaminophen (TYLENOL) 650 MG CR tablet Take 1,300 mg by mouth daily as needed for pain.     [provider]  amLODipine (NORVASC) 5 MG tablet Take 5 mg by mouth daily. 11/14/17   [provider]  busPIRone (BUSPAR) 5 MG tablet Take 5 mg by mouth 2 (two) times daily. 08/27/22   [provider]  Calcium Carbonate-Vit D-Min (CALTRATE 600+D PLUS PO) Take 1 tablet by mouth daily.    [provider]  escitalopram (LEXAPRO) 10 MG tablet Take 10 mg by mouth at bedtime.     [provider]  Fluticasone-Salmeterol (ADVAIR DISKUS) 250-50 MCG/DOSE AEPB Inhale 1 puff into the lungs 2 (two) times a day. 04/27/19   [name], MD  Fluticasone-Umeclidin-Vilant (TRELEGY ELLIPTA) 100-62.5-25 MCG/ACT AEPB Inhale 28 each into the lungs daily. 09/03/22   Rigoberto Noel, MD  LORazepam (ATIVAN) 0.5 MG tablet Take 0.5 mg by mouth 2 (two) times daily as needed for anxiety. 03/13/22   [provider]  LORazepam (ATIVAN) 1 MG tablet Take 1 mg by mouth 3 (three) times daily. 08/25/22    [provider]  MAGNESIUM CARBONATE PO Take 1 tablet by mouth daily.    [provider]  Multiple Vitamin (MULTIVITAMIN WITH MINERALS) TABS tablet Take 1 tablet by mouth daily.    [provider]  nicotine (NICODERM CQ - DOSED IN MG/24 HOURS) 21 mg/24hr patch Place 1 patch (21 mg total) onto the skin daily. 04/27/19   [name], MD  ondansetron (ZOFRAN ODT) 4 MG disintegrating tablet Take 1 tablet (4 mg total) by mouth every 8 (eight) hours as needed for nausea or vomiting. 01/06/18   [name], Ripudeep K, MD  ondansetron (ZOFRAN) 4 MG tablet Take 1 tablet (4 mg total) by mouth every 8 (eight) hours as needed for nausea or vomiting. 05/13/22   Tegeler, Gwenyth Allegra, MD  pantoprazole (PROTONIX) 20 MG tablet Take 1 tablet (20 mg total) by mouth daily. 04/27/22   Lacretia Leigh, MD  pantoprazole (PROTONIX) 40 MG tablet Take 40 mg by mouth daily. 10/28/17   [provider]  polycarbophil (FIBERCON) 625 MG tablet Take 625 mg by mouth daily.    [provider]  Probiotic Product (ALIGN PO) Take 1 tablet by mouth daily.    [provider]  raloxifene (EVISTA) 60 MG tablet Take 60 mg by mouth daily. 12/30/17   [provider]  senna-docusate (SENOKOT-S) 8.6-50 MG tablet Take 1 tablet by mouth daily. 04/28/19   [name],  MD  simvastatin (ZOCOR) 20 MG tablet Take 20 mg by mouth at bedtime.     [provider]  sucralfate (CARAFATE) 1 g tablet Take 1 g by mouth 3 (three) times daily as needed. 03/13/22   [provider]  umeclidinium bromide (INCRUSE ELLIPTA) 62.5 MCG/INH AEPB Inhale 1 puff into the lungs daily. 04/28/19   [name], MD  venlafaxine XR (EFFEXOR-XR) 150 MG 24 hr capsule Take 150 mg by mouth every morning. 08/03/22   [provider]  vitamin B-12 (CYANOCOBALAMIN) 1000 MCG tablet Take 1 tablet (1,000 mcg total) by mouth daily. 04/27/19   [name], MD      Allergies    Ciprofloxacin,  Nitrofuran derivatives, Penicillins, and Sulfa antibiotics    Review of Systems   Review of Systems  Unable to perform ROS: Patient unresponsive    Physical Exam Updated Vital Signs BP (!) 86/43   Pulse 72   Resp 20   Ht 5' (1.524 m)   Wt 37.3 kg   SpO2 98%   BMI 16.06 kg/m  Physical Exam Vitals and nursing note reviewed.   86 year old female who is unresponsive. Vital signs are significant for low blood pressure. Oxygen saturation is 98%, which is normal. Head is normocephalic and atraumatic. Pupils are pinpoint.  Nasal trumpet is in place and is being tolerated. Neck i has no adenopathy or JVD. Lungs are clear without rales, wheezes, or rhonchi. Chest moves symmetrically. Heart has regular rate and rhythm without murmur. Abdomen is soft, flat.  Aortic pulsations are palpable but aorta does not appear to be enlarged. Extremities have no cyanosis or edema. Skin is warm and dry without rash. Neurologic: Unresponsive.  ED Results / Procedures / Treatments   Labs (all labs ordered are listed, but only abnormal results are displayed) Labs Reviewed  COMPREHENSIVE METABOLIC PANEL - Abnormal; Notable for the following components:      Result Value   Sodium 133 (*)    Potassium 3.3 (*)    Chloride 94 (*)    Glucose, Bld 178 (*)    Creatinine, Ser 1.04 (*)    Total Protein 5.0 (*)    Albumin 3.0 (*)    AST 14 (*)    Alkaline Phosphatase 34 (*)    GFR, Estimated 53 (*)    All other components within normal limits  LACTIC ACID, PLASMA - Abnormal; Notable for the following components:   Lactic Acid, Venous 2.2 (*)    All other components within normal limits  CBC WITH DIFFERENTIAL/PLATELET - Abnormal; Notable for the following components:   RBC 3.20 (*)    Hemoglobin 11.1 (*)    HCT 32.3 (*)    MCV 100.9 (*)    MCH 34.7 (*)    All other components within normal limits  RAPID URINE DRUG SCREEN, HOSP PERFORMED - Abnormal; Notable for the following components:    Benzodiazepines POSITIVE (*)    All other components within normal limits  URINALYSIS, ROUTINE W REFLEX MICROSCOPIC - Abnormal; Notable for the following components:   APPearance HAZY (*)    Protein, ur 30 (*)    All other components within normal limits  TROPONIN I (HIGH SENSITIVITY) - Abnormal; Notable for the following components:   Troponin I (High Sensitivity) 20 (*)    All other components within normal limits  TROPONIN I (HIGH SENSITIVITY) - Abnormal; Notable for the following components:   Troponin I (High Sensitivity) 29 (*)    All other  components within normal limits  LACTIC ACID, PLASMA    EKG EKG Interpretation  Date/Time:  Thursday September 06 2022 00:43:38 EDT Ventricular Rate:  76 PR Interval:  167 QRS Duration: 133 QT Interval:  467 QTC Calculation: 526 R Axis:   2 Text Interpretation: Sinus rhythm IVCD, consider atypical LBBB When compared with ECG of 08/04/2022, Premature ventricular complexes are no longer present Confirmed by Delora Fuel ([id number]) on 09/06/2022 12:47:49 AM  Radiology DG Chest Port 1 View  Result Date: 09/06/2022 CLINICAL DATA:  Unresponsive EXAM: PORTABLE CHEST 1 VIEW COMPARISON:  05/13/2022 FINDINGS: Mild cardiac enlargement. No vascular congestion, edema, or consolidation. Emphysematous changes in the lungs. Scarring in the lung apices. No pleural effusions. No pneumothorax. Mediastinal contours appear intact. Calcification of the aorta. Old fracture deformity of the right shoulder. IMPRESSION: Emphysematous changes and scattered fibrosis in the lungs. No focal consolidation. Electronically Signed   By: Lucienne Capers M.D.   On: 09/06/2022 01:29    Procedures Procedures  Cardiac monitor shows normal sinus rhythm, per my interpretation.  Medications Ordered in ED Medications  lactated ringers infusion (has no administration in time range)  potassium chloride 10 mEq in 100 mL IVPB (has no administration in time range)  pantoprazole  (PROTONIX) injection 40 mg (has no administration in time range)  ipratropium-albuterol (DUONEB) 0.5-2.5 (3) MG/3ML nebulizer solution 3 mL (has no administration in time range)  budesonide (PULMICORT) nebulizer solution 0.5 mg (has no administration in time range)  norepinephrine (LEVOPHED) 4-5 MG/250ML-% infusion SOLN (4 mcg/min  Rate/Dose Change 09/06/22 0354)  lactated ringers bolus 1,000 mL (0 mLs Intravenous Stopped 09/06/22 0345)    ED Course/ Medical Decision Making/ A&P                           Medical Decision Making Amount and/or Complexity of Data Reviewed Labs: ordered. Radiology: ordered.  Risk Decision regarding hospitalization.   Collapse at home with bystander CPR and spontaneous return of circulation.  Doubt that she actually had cardiac arrest.  Hypotension of uncertain cause.  I have reviewed and interpreted her electrocardiogram and my interpretation is left bundle branch block which is unchanged from prior.  I have ordered IV fluids and switched her from epinephrine to norepinephrine for blood pressure control.  Old records are reviewed, and she had been admitted on 03/16/2022 for COPD exacerbation, she was full code at that time.  I have ordered laboratory work-up of CBC, comprehensive metabolic panel, lactic acid level, troponin, urinalysis and I have ordered chest x-ray and CT of head.  Family has arrived.  Patient apparently has been generally declining over the past year including complaints of chronic abdominal pain and approximately 20 pound involuntary weight loss.  She had no specific complaints over the last several days.  They also state that patient is DNR and would not want to be intubated.  Chest x-ray shows emphysema and fibrotic changes which are unchanged from prior chest x-ray.  I have independently viewed the image, and agree with radiologist's interpretation.  I have reviewed and interpreted the laboratory tests, and my interpretation is macrocytic  anemia which is new compared with 08/04/2022, mild hyponatremia which is actually improved compared with 08/04/2022, mild hypokalemia.  Blood pressure is improved, but continues to be marginal following IV fluids and on IV norepinephrine.  On further review of past records, she had CT of abdomen and pelvis on 04/27/2022 showing diverticulosis, degenerative disease of lumbar spine, and no  other significant findings.  Lactic acid level is mildly elevated, felt to be secondary to hypotension and not sepsis.  A second family member has arrived, confirming most of what was stated above although she states the client is actually been over the last 6 months.  She also expresses some concern that she may have been getting some medication that was not appropriate and is specifically worried about clonazepam or sleeping pills and has requested a drug screen be obtained.  I have ordered a urine drug screen.  CT of head is still pending.  Drug screen is positive for benzodiazepines, but she does have lorazepam on her medication list, and that is likely the source.  Repeat troponin has increased slightly, I still very low index of suspicion for ACS and still feel that it is related to trauma from CPR.  Repeat lactic acid level is normal, but patient continues to be hypotensive.  Mental status has not improved.  Altered mentation seems to be a metabolic encephalopathy.  CT of the head is still pending.  Case is discussed with Dr. Patsey Berthold of pulmonary critical care service, who agrees to admit the patient.  CRITICAL CARE Performed by: Delora Fuel Total critical care time: 105 minutes Critical care time was exclusive of separately billable procedures and treating other patients. Critical care was necessary to treat or prevent imminent or life-threatening deterioration. Critical care was time spent personally by me on the following activities: development of treatment plan with patient and/or surrogate as well as nursing,  discussions with consultants, evaluation of patient's response to treatment, examination of patient, obtaining history from patient or surrogate, ordering and performing treatments and interventions, ordering and review of laboratory studies, ordering and review of radiographic studies, pulse oximetry and re-evaluation of patient's condition.  Final Clinical Impression(s) / ED Diagnoses Final diagnoses:  Metabolic encephalopathy  Hypotension, unspecified hypotension type  Elevated lactic acid level  Macrocytic anemia  Hyponatremia  Hypokalemia  Elevated troponin    Rx / DC Orders ED Discharge Orders     None         Delora Fuel, MD 62/13/08 [phone]

## 2022-09-06 NOTE — Progress Notes (Signed)
Seen in ER. Appears to be improving from mental status standpoint per daughter. Hope PMT can work with family to allow a peaceful death at home.  Erskine Emery MD PCCM

## 2022-09-06 NOTE — Consult Note (Signed)
Consultation Note Date: 09/06/2022   Patient Name: Carol [name]  DOB: June 12, 1936  MRN: [id number]  Age / Sex: 86 y.o., female  PCP: Shirline Frees, MD Referring Physician: Candee Furbish, MD  Reason for Consultation: Establishing goals of care  HPI/Patient Profile: 86 y.o. female  with past medical history of COPD, depression/anxiety, hyponatremia, HTN admitted on 09/06/2022 with unresponsiveness.   Patient admitted with shock from hypovolemia vs medication from benzodiazepines and acute on chronic hypoxemic respiratory failure. PMT has been consulted to assist with goals of care conversation.  Clinical Assessment and Goals of Care:  I have reviewed medical records including EPIC notes, labs and imaging, received report from RN, assessed the patient and then met at the bedside and relocated to conference room with patient's daughter Kennyth Lose and son Timmothy Sours to discuss diagnosis prognosis, Smiley, EOL wishes, disposition and options.  I introduced Palliative Medicine as specialized medical care for people living with serious illness. It focuses on providing relief from the symptoms and stress of a serious illness. The goal is to improve quality of life for both the patient and the family.  We discussed a brief life review of the patient and then focused on their current illness. The natural disease trajectory and expectations at EOL were discussed.  I attempted to elicit values and goals of care important to the patient.    Medical History Review and Understanding:  Patient's acute illness and chronic failure to thrive were discussed.  Family understands the severity of patient's illness.  Social History: Patient lives in her own home with her children and grandchildren taking shifts with her at all times.  She does not like to be alone.  She has been widowed for the past 14 years.  She is very anxious and family  reports she was overmedicating with Ativan before being weaned down to 4 times daily.  She has seen psychiatry and counselors.  She also lost a grandson in July and did not take this well.  Functional and Nutritional State: Patient has had gradual decline since December of last year and more rapid decline since April admission for pneumonia.  Patient's family has been bathing her.  Patient is also very scared to choke and wants somebody watching her eat and drink at all times.  Worsening weakness, unexpected weight loss, very poor p.o. intake.  Palliative Symptoms: Anxiety, agitation  Advance Directives: A detailed discussion regarding advanced directives was had.  No HCPOA or advanced directives on file, patient's 4 adult children work together to make decisions if she is unable.   Code Status: Concepts specific to code status, artifical feeding and hydration, and rehospitalization were considered and discussed.   Discussion: Patient's daughter Kennyth Lose is a tremendous support system for her, as well as her granddaughter.  Patient's other daughter has recently lost her husband and son and has not been able to assist as much lately.  Patient's son Timmothy Sours also has had a recent total knee replacement and is unable to physically assist as much as he used to when he moved from [place] to stay with her overnight.  Family is all in agreement that something needs to change as they cannot go on the way they have been.  It was a great shock to find patient unresponsive overnight, however they view this as a blessing given that they now have the opportunity and resources to create a new plan moving forward.  Counseled on the risk of further decline and escalation of pressor support, advising  against invasive procedures such as central line placement and emphasizing the importance of anticipatory care planning.  Patient's daughter Kennyth Lose is very tearful as she has been pushing patient to attend all her  appointments and be as healthy as possible.  Emotional support and therapeutic listening provided.  Kennyth Lose feels that sometimes patient wants to get better but other times she has no interest in life-prolonging interventions or anything at all.  Patient has lost interest in most things she used to enjoy and Kennyth Lose also wonders if this is due to depression rather than truly her care preferences.  We discussed the different options including SNF placement, hospice at home, residential hospice facility.  I shared that I am worried patient would not tolerate physical therapy especially if she is not motivated to participate.  Counseled that patient's goals should be the priority and that if she wishes to simply focus on her symptom control and take her Ativan noting potential risks, hospice would be very appropriate.  Patient's son Timmothy Sours feels inclined to pursue SNF placement in avoid patient's "addiction to this medication."  They feel that all 4 children will need to be present before a decision is made and that patient will need to have further improvement in mental status to participate.  Kennyth Lose hopes to help patient stay at home as long as possible while also acknowledging the immense caregiver burden on all of them.   The difference between aggressive medical intervention and comfort care was considered in light of the patient's goals of care. Hospice and Palliative Care services outpatient were explained and offered.   Discussed the importance of continued conversation with family and the medical providers regarding overall plan of care and treatment options, ensuring decisions are within the context of the patient's values and GOCs.   Questions and concerns were addressed.  Hard Choices booklet left for review. The family was encouraged to call with questions or concerns.  PMT will continue to support holistically.  SUMMARY OF RECOMMENDATIONS   -DNR confirmed -Continue full scope treatment for now,  patient's family will call PMT with a preferred date/time for family meeting - likely in a couple of days to allow time for improvement -Psychosocial and emotional support provided -PMT will continue to follow and support  Prognosis:  Poor prognosis given acute shock, failure to thrive, poor functional/nutritional status, advanced age  Discharge Planning: To Be Determined      Primary Diagnoses: Present on Admission:  AMS (altered mental status)  Physical Exam Vitals and nursing note reviewed.  Cardiovascular:     Rate and Rhythm: Tachycardia present.  Pulmonary:     Effort: Tachypnea present.  Neurological:     Mental Status: She is alert.  Psychiatric:        Mood and Affect: Mood is anxious.        Behavior: Behavior is agitated.        Cognition and Memory: Cognition is impaired.        Judgment: Judgment is impulsive.   Vital Signs: BP (!) 131/97   Pulse (!) 110   Temp (!) 97.3 F (36.3 C) (Axillary)   Resp (!) 24   Ht 5' (1.524 m)   Wt 37.3 kg   SpO2 98%   BMI 16.06 kg/m  Pain Scale: 0-10   Pain Score: 0-No pain   SpO2: SpO2: 98 % O2 Device:SpO2: 98 % O2 Flow Rate: .O2 Flow Rate (L/min): 4 L/min   Palliative Assessment/Data:      Total time:  I spent 105 minutes in the care of the patient today in the above activities and documenting the encounter.  MDM: High   Herson Prichard Johnnette Litter, PA-C  Palliative Medicine Team Team phone # [phone]  Thank you for allowing the Palliative Medicine Team to assist in the care of this patient. Please utilize secure chat with additional questions, if there is no response within 30 minutes please call the above phone number.  Palliative Medicine Team providers are available by phone from 7am to 7pm daily and can be reached through the team cell phone.  Should this patient require assistance outside of these hours, please call the patient's attending physician.

## 2022-09-06 NOTE — ED Triage Notes (Signed)
The pt was found on the floor by family who did 2-3 minutes of cpr by her daughter and ems felt l;ike the pt had purposeful movements on arrival being bagged  pt not responding to verbal stimuli or painful stimulu

## 2022-09-06 NOTE — ED Notes (Signed)
3rd liter of fluid hung wide open  levophed increased to 6 but with the iv fluid wide open her bp increased but went back down when the levophed was decreased to 4 again

## 2022-09-06 NOTE — ED Notes (Signed)
Pt placed on bedpan

## 2022-09-06 NOTE — Progress Notes (Signed)
[place] Progress Note Patient Name: Carol [name] DOB: 1936/01/19 MRN: [id number]   Date of Service  09/06/2022  HPI/Events of Note  Hypoglycemia - Blood glucose = 72. Patient is NPO.   eICU Interventions  Plan: D10W IV infusion at 40 mL/hour. Decrease LR IV infusion to 75 mL/hour     Intervention Category Major Interventions: Other:  Lysle Dingwall 09/06/2022, 9:58 PM

## 2022-09-06 NOTE — Progress Notes (Incomplete)
  Echocardiogram 2D Echocardiogram has been performed.  Ronny Flurry 09/06/2022, 2:19 PM

## 2022-09-06 NOTE — ED Notes (Signed)
Bair hugger removed  temp taken  pt opened both her eyes and groaned for the first time

## 2022-09-06 NOTE — Progress Notes (Signed)
Appreciate PMT help. More agitated, delirious, will try haldol and if ineffective precedex.  My cc time through day: 23 minutes  Erskine Emery MD PCCM

## 2022-09-07 DIAGNOSIS — F17210 Nicotine dependence, cigarettes, uncomplicated: Secondary | ICD-10-CM | POA: Insufficient documentation

## 2022-09-07 DIAGNOSIS — E782 Mixed hyperlipidemia: Secondary | ICD-10-CM | POA: Diagnosis present

## 2022-09-07 DIAGNOSIS — R4182 Altered mental status, unspecified: Secondary | ICD-10-CM | POA: Diagnosis not present

## 2022-09-07 DIAGNOSIS — I25118 Atherosclerotic heart disease of native coronary artery with other forms of angina pectoris: Secondary | ICD-10-CM | POA: Diagnosis present

## 2022-09-07 DIAGNOSIS — I429 Cardiomyopathy, unspecified: Secondary | ICD-10-CM | POA: Insufficient documentation

## 2022-09-07 DIAGNOSIS — I2510 Atherosclerotic heart disease of native coronary artery without angina pectoris: Secondary | ICD-10-CM | POA: Diagnosis present

## 2022-09-07 DIAGNOSIS — I700 Atherosclerosis of aorta: Secondary | ICD-10-CM | POA: Insufficient documentation

## 2022-09-07 DIAGNOSIS — I5020 Unspecified systolic (congestive) heart failure: Secondary | ICD-10-CM | POA: Insufficient documentation

## 2022-09-07 LAB — BASIC METABOLIC PANEL
Anion gap: 8 (ref 5–15)
BUN: 8 mg/dL (ref 8–23)
CO2: 31 mmol/L (ref 22–32)
Calcium: 8.7 mg/dL — ABNORMAL LOW (ref 8.9–10.3)
Chloride: 101 mmol/L (ref 98–111)
Creatinine, Ser: 0.61 mg/dL (ref 0.44–1.00)
GFR, Estimated: >60 mL/min (ref >60)
Glucose, Bld: 95 mg/dL (ref 70–99)
Potassium: 3.9 mmol/L (ref 3.5–5.1)
Sodium: 140 mmol/L (ref 135–145)

## 2022-09-07 LAB — PHOSPHORUS
Phosphorus: 4.2 mg/dL (ref 2.5–4.6)
Phosphorus: 3.8 mg/dL (ref 2.5–4.6)

## 2022-09-07 LAB — MAGNESIUM
Magnesium: 1.4 mg/dL — ABNORMAL LOW (ref 1.7–2.4)
Magnesium: 2.2 mg/dL (ref 1.7–2.4)

## 2022-09-07 LAB — CBC
HCT: 33.4 % — ABNORMAL LOW (ref 36.0–46.0)
Hemoglobin: 11.3 g/dL — ABNORMAL LOW (ref 12.0–15.0)
MCH: 34.3 pg — ABNORMAL HIGH (ref 26.0–34.0)
MCHC: 33.8 g/dL (ref 30.0–36.0)
MCV: 101.5 fL — ABNORMAL HIGH (ref 80.0–100.0)
Platelets: 173 10*3/uL (ref 150–400)
RBC: 3.29 MIL/uL — ABNORMAL LOW (ref 3.87–5.11)
RDW: 12.9 % (ref 11.5–15.5)
WBC: 7.5 10*3/uL (ref 4.0–10.5)
nRBC: 0 % (ref 0.0–0.2)

## 2022-09-07 LAB — D-DIMER, QUANTITATIVE: D-Dimer, Quant: 1.28 ug/mL-FEU — ABNORMAL HIGH (ref 0.00–0.50)

## 2022-09-07 LAB — SODIUM, URINE, RANDOM: Sodium, Ur: 117 mmol/L

## 2022-09-07 LAB — GLUCOSE, CAPILLARY: Glucose-Capillary: 80 mg/dL (ref 70–99)

## 2022-09-07 MED ORDER — FUROSEMIDE 10 MG/ML IJ SOLN
20.0000 mg | Freq: Every day | INTRAMUSCULAR | Status: DC
  Administered 2022-09-07 – 2022-09-11 (×5): 20 mg via INTRAVENOUS
  Filled 2022-09-07 (×6): qty 2

## 2022-09-07 MED ORDER — ENOXAPARIN SODIUM 30 MG/0.3ML IJ SOSY
30.0000 mg | PREFILLED_SYRINGE | INTRAMUSCULAR | Status: DC
  Administered 2022-09-07 – 2022-09-14 (×7): 30 mg via SUBCUTANEOUS
  Filled 2022-09-07 (×7): qty 0.3

## 2022-09-07 MED ORDER — MUPIROCIN 2 % EX OINT
1.0000 | TOPICAL_OINTMENT | Freq: Two times a day (BID) | CUTANEOUS | Status: AC
  Administered 2022-09-07 – 2022-09-10 (×6): 1 via NASAL
  Filled 2022-09-07 (×2): qty 22

## 2022-09-07 MED ORDER — ADULT MULTIVITAMIN W/MINERALS CH
1.0000 | ORAL_TABLET | Freq: Every day | ORAL | Status: DC
  Administered 2022-09-07 – 2022-09-15 (×8): 1 via ORAL
  Filled 2022-09-07 (×8): qty 1

## 2022-09-07 MED ORDER — DAPAGLIFLOZIN PROPANEDIOL 10 MG PO TABS
10.0000 mg | ORAL_TABLET | Freq: Every day | ORAL | Status: DC
  Administered 2022-09-07 – 2022-09-15 (×9): 10 mg via ORAL
  Filled 2022-09-07 (×9): qty 1

## 2022-09-07 MED ORDER — SIMVASTATIN 20 MG PO TABS
20.0000 mg | ORAL_TABLET | Freq: Every day | ORAL | Status: DC
  Administered 2022-09-07 – 2022-09-14 (×7): 20 mg via ORAL
  Filled 2022-09-07 (×8): qty 1

## 2022-09-07 MED ORDER — BUSPIRONE HCL 5 MG PO TABS
5.0000 mg | ORAL_TABLET | Freq: Two times a day (BID) | ORAL | Status: DC
  Administered 2022-09-07 – 2022-09-15 (×15): 5 mg via ORAL
  Filled 2022-09-07 (×17): qty 1

## 2022-09-07 MED ORDER — ORAL CARE MOUTH RINSE: 15.0000 mL | OROMUCOSAL | Status: DC | PRN

## 2022-09-07 MED ORDER — IVABRADINE HCL 5 MG PO TABS
5.0000 mg | ORAL_TABLET | Freq: Two times a day (BID) | ORAL | Status: DC
  Administered 2022-09-07 – 2022-09-15 (×15): 5 mg via ORAL
  Filled 2022-09-07 (×17): qty 1

## 2022-09-07 MED ORDER — RALOXIFENE HCL 60 MG PO TABS
60.0000 mg | ORAL_TABLET | Freq: Every day | ORAL | Status: DC
  Administered 2022-09-08 – 2022-09-15 (×8): 60 mg via ORAL
  Filled 2022-09-07 (×9): qty 1

## 2022-09-07 MED ORDER — VENLAFAXINE HCL ER 225 MG PO TB24
225.0000 mg | ORAL_TABLET | Freq: Every morning | ORAL | Status: DC
  Filled 2022-09-07: qty 1

## 2022-09-07 MED ORDER — MAGNESIUM SULFATE 4 GM/100ML IV SOLN: 4.0000 g | Freq: Once | INTRAVENOUS | Status: DC

## 2022-09-07 MED ORDER — POTASSIUM CHLORIDE 20 MEQ PO PACK
40.0000 meq | PACK | Freq: Two times a day (BID) | ORAL | Status: AC
  Administered 2022-09-07 – 2022-09-08 (×2): 40 meq via ORAL
  Filled 2022-09-07 (×2): qty 2

## 2022-09-07 MED ORDER — MAGNESIUM SULFATE 4 GM/100ML IV SOLN
4.0000 g | Freq: Once | INTRAVENOUS | Status: AC
  Administered 2022-09-07: 4 g via INTRAVENOUS
  Filled 2022-09-07: qty 100

## 2022-09-07 MED ORDER — IPRATROPIUM-ALBUTEROL 0.5-2.5 (3) MG/3ML IN SOLN: 3.0000 mL | Freq: Four times a day (QID) | RESPIRATORY_TRACT | Status: DC | PRN

## 2022-09-07 MED ORDER — ORAL CARE MOUTH RINSE
15.0000 mL | OROMUCOSAL | Status: DC
  Administered 2022-09-08 – 2022-09-15 (×22): 15 mL via OROMUCOSAL

## 2022-09-07 MED ORDER — VENLAFAXINE HCL ER 75 MG PO CP24
225.0000 mg | ORAL_CAPSULE | Freq: Every day | ORAL | Status: DC
  Filled 2022-09-07: qty 1

## 2022-09-07 MED ORDER — ENSURE ENLIVE PO LIQD
237.0000 mL | Freq: Two times a day (BID) | ORAL | Status: DC
  Administered 2022-09-08 – 2022-09-15 (×10): 237 mL via ORAL

## 2022-09-07 MED ORDER — UMECLIDINIUM BROMIDE 62.5 MCG/ACT IN AEPB
1.0000 | INHALATION_SPRAY | Freq: Every day | RESPIRATORY_TRACT | Status: DC
  Administered 2022-09-08 – 2022-09-15 (×6): 1 via RESPIRATORY_TRACT
  Filled 2022-09-07 (×2): qty 7

## 2022-09-07 MED ORDER — FLUTICASONE FUROATE-VILANTEROL 100-25 MCG/ACT IN AEPB
1.0000 | INHALATION_SPRAY | Freq: Every day | RESPIRATORY_TRACT | Status: DC
  Administered 2022-09-09 – 2022-09-15 (×5): 1 via RESPIRATORY_TRACT
  Filled 2022-09-07 (×3): qty 28

## 2022-09-07 MED ORDER — LORAZEPAM 0.5 MG PO TABS
0.5000 mg | ORAL_TABLET | Freq: Three times a day (TID) | ORAL | Status: DC
  Administered 2022-09-07 – 2022-09-10 (×7): 0.5 mg via ORAL
  Filled 2022-09-07 (×9): qty 1

## 2022-09-07 MED ORDER — MIRTAZAPINE 15 MG PO TABS
7.5000 mg | ORAL_TABLET | Freq: Every day | ORAL | Status: DC
  Administered 2022-09-07: 7.5 mg via ORAL
  Filled 2022-09-07 (×2): qty 1

## 2022-09-07 MED ORDER — ASPIRIN 81 MG PO CHEW
81.0000 mg | CHEWABLE_TABLET | Freq: Every day | ORAL | Status: DC
  Administered 2022-09-07 – 2022-09-15 (×9): 81 mg via ORAL
  Filled 2022-09-07 (×9): qty 1

## 2022-09-07 MED ORDER — VENLAFAXINE HCL ER 75 MG PO CP24
150.0000 mg | ORAL_CAPSULE | Freq: Every day | ORAL | Status: DC
  Administered 2022-09-07 – 2022-09-08 (×2): 150 mg via ORAL
  Filled 2022-09-07 (×2): qty 1

## 2022-09-07 NOTE — Consult Note (Signed)
CARDIOLOGY CONSULT NOTE  Patient ID: Carol [name] MRN: [id number] DOB/AGE: 01-23-1936 86 y.o.  Admit date: 09/06/2022 Attending physician: Collier Bullock, MD  Primary Physician:  Shirline Frees, MD Outpatient Cardiologist: NA Inpatient Cardiologist: Rex Kras, DO, [facility]  Reason of consultation: Cardiomyopathy Referring physician: Collier Bullock, MD / Dr. Leigh Aurora  Chief complaint: Altered mental status  HPI:  Carol [name] is a 86 y.o. Caucasian female who presents with a chief complaint of " altered mental status." Her past medical history and cardiovascular risk factors include: COPD, HTN, HLD, current smoker, advanced age.  Cardiology was consulted during his hospitalization for newly discovered cardiomyopathy.  Patient is accompanied by her son and 2 granddaughters at the time of the evaluation in the medical ICU.  In the recent past patient has been getting progressively weak (lost approximately 20 pounds over the last 8 to 10 months).  She was in her usual state of health until 09/05/2022.  She slowly was becoming confused and Thursday morning she was found by her daughter not responding.  The course of events are not clear.  Family is not sure if she lost pulse but her daughter did perform CPR for a few minutes until EMS arrived.  She was admitted to the medical ICU due to altered mental status likely due to metabolic encephalopathy & hypovolemic shock.  During the work-up surface echocardiogram was performed which noted severely reduced LVEF with regional wall motion abnormalities; therefore, cardiology is consulted.  At the time of evaluation patient denies anginal discomfort, orthopnea, PND, lower extremity swelling.  Family also endorses that he she has not mentioned any cardiovascular symptoms in the recent past.  ALLERGIES: Allergies  Allergen Reactions   Ciprofloxacin Nausea Only and Other (See Comments)    Nausea and legs weak   Nitrofuran Derivatives  Nausea Only and Other (See Comments)    nitrofuratonin bad nausea, legs weak   Paxil [Paroxetine] Other (See Comments)    Sick on stomach   Penicillins Other (See Comments)    Sick on stomach   Sulfa Antibiotics Other (See Comments)    jittery    PAST MEDICAL HISTORY: Past Medical History:  Diagnosis Date   Anxiety    Arthritis    arthritis -hips   Colitis    Complication of anesthesia    B/P dropped post colonoscopy x1   GERD (gastroesophageal reflux disease)    Headache(784.0)    migraines rare occ.   Hypertension    Urinary tract infection early aug 2014    PAST SURGICAL HISTORY: Past Surgical History:  Procedure Laterality Date   CATARACT EXTRACTION, BILATERAL Bilateral    COLONOSCOPY WITH PROPOFOL N/A 08/03/2013   Procedure: COLONOSCOPY WITH PROPOFOL;  Surgeon: Winfield Cunas., MD;  Location: [facility];  Service: Endoscopy;  Laterality: N/A;  ultra thin colon scope   COLONOSCOPY WITH PROPOFOL N/A 02/04/2018   Procedure: COLONOSCOPY WITH PROPOFOL;  Surgeon: Laurence Spates, MD;  Location: [facility];  Service: Endoscopy;  Laterality: N/A;   LASIK      FAMILY HISTORY: No family history of premature coronary artery disease or sudden cardiac death.   SOCIAL HISTORY:  The patient  reports that she has been smoking cigarettes. She has a 10.00 pack-year smoking history. She has never used smokeless tobacco. She reports that she does not drink alcohol and does not use drugs.  MEDICATIONS: Current Outpatient Medications  Medication Instructions   acetaminophen (TYLENOL) 1,300 mg, Oral, Daily PRN   amLODipine (NORVASC) 5 mg,  Oral, Daily   busPIRone (BUSPAR) 5 mg, Oral, 2 times daily   Fluticasone-Umeclidin-Vilant (TRELEGY ELLIPTA) 100-62.5-25 MCG/ACT AEPB 28 each, Inhalation, Daily   LORazepam (ATIVAN) 1 mg, Oral, 3 times daily   nicotine (NICODERM CQ - DOSED IN MG/24 HOURS) 21 mg, Transdermal, Every 24 hours   ondansetron (ZOFRAN) 4 mg, Oral, Every 8 hours PRN    pantoprazole (PROTONIX) 20 mg, Oral, Daily   Probiotic Product (ALIGN PO) 1 tablet, Oral, Daily   raloxifene (EVISTA) 60 mg, Oral, Daily   simvastatin (ZOCOR) 20 mg, Oral, Daily at bedtime   umeclidinium bromide (INCRUSE ELLIPTA) 62.5 MCG/INH AEPB 1 puff, Inhalation, Daily   Venlafaxine HCl 225 mg, Oral, Every morning    REVIEW OF SYSTEMS: Review of Systems  Constitutional: Positive for decreased appetite and malaise/fatigue.  HENT:         Hard of hearing  Cardiovascular:  Negative for chest pain, claudication, dyspnea on exertion, irregular heartbeat, leg swelling, near-syncope, orthopnea, palpitations, paroxysmal nocturnal dyspnea and syncope.  Respiratory:  Negative for shortness of breath.   Hematologic/Lymphatic: Negative for bleeding problem.  Musculoskeletal:  Positive for arthritis, falls and joint pain. Negative for muscle cramps and myalgias.  Neurological:  Negative for dizziness and light-headedness.  All other systems reviewed and are negative.   PHYSICAL EXAM:    09/07/2022    4:00 PM 09/07/2022    3:00 PM 09/07/2022    2:00 PM  Vitals with BMI  Systolic 734 193 790  Diastolic 65 72 74  Pulse 240 104 101     Intake/Output Summary (Last 24 hours) at 09/07/2022 1656 Last data filed at 09/07/2022 1500 Gross per 24 hour  Intake 2145.55 ml  Output 2700 ml  Net -554.45 ml    Net IO Since Admission: 776.05 mL [09/07/22 1656]  CONSTITUTIONAL: Appears older than stated age, frail, cachectic, hemodynamically stable SKIN: Skin is warm and dry. No rash noted. No cyanosis. No pallor. No jaundice HEAD: Normocephalic and atraumatic.  EYES: No scleral icterus MOUTH/THROAT: Moist oral membranes.  NECK: No JVD present. No thyromegaly noted. No carotid bruits  CHEST Normal respiratory effort. No intercostal retractions  LUNGS: Clear to auscultation bilaterally with faint crackles at the bases. CARDIOVASCULAR: Tachycardic, positive S1-S2, no murmurs rubs or gallops  appreciated.   ABDOMINAL: Cachectic, soft, positive bowel sounds in all 4 quadrants  EXTREMITIES: No pitting edema, 2+ DP and PT pulses, warm to touch.  HEMATOLOGIC: No significant bruising NEUROLOGIC: Oriented to person, place, and time. Nonfocal. Normal muscle tone.  PSYCHIATRIC: Normal mood and affect. Normal behavior. Cooperative  RADIOLOGY: ECHOCARDIOGRAM COMPLETE  Result Date: 09/06/2022    ECHOCARDIOGRAM REPORT   Patient Name:   Carol [name] Date of Exam: 09/06/2022 Medical Rec #:  [id number]         Height:       60.0 in Accession #:    [id number]        Weight:       82.2 lb Date of Birth:  1936/04/07        BSA:          1.279 m Patient Age:    56 years          BP:           102/49 mmHg Patient Gender: F                 HR:           113 bpm. Exam Location:  Inpatient Procedure:  2D Echo, Cardiac Doppler and Color Doppler Indications:    [facility]) [[id number]]  History:        Patient has no prior history of Echocardiogram examinations.                 COPD; Risk Factors:Hypertension and Current Smoker.  Sonographer:    Ronny Flurry Referring Phys: [id number] [place]  1. Left ventricular ejection fraction, by estimation, is 20 to 25%. The left ventricle has severely decreased function. The left ventricle demonstrates global hypokinesis. There is mild left ventricular hypertrophy. Left ventricular diastolic parameters  are indeterminate.  2. Right ventricular systolic function is normal. The right ventricular size is normal.  3. Left atrial size was mildly dilated.  4. There is no evidence of cardiac tamponade.  5. The mitral valve is normal in structure. Trivial mitral valve regurgitation. No evidence of mitral stenosis.  6. The aortic valve is normal in structure. Aortic valve regurgitation is not visualized. No aortic stenosis is present. FINDINGS  Left Ventricle: Left ventricular ejection fraction, by estimation, is 20 to 25%. The left ventricle has severely decreased  function. The left ventricle demonstrates global hypokinesis. The left ventricular internal cavity size was normal in size. There is mild left ventricular hypertrophy. Left ventricular diastolic parameters are indeterminate. Right Ventricle: The right ventricular size is normal. No increase in right ventricular wall thickness. Right ventricular systolic function is normal. Left Atrium: Left atrial size was mildly dilated. Right Atrium: Right atrial size was normal in size. Pericardium: Trivial pericardial effusion is present. There is no evidence of cardiac tamponade. Mitral Valve: The mitral valve is normal in structure. Trivial mitral valve regurgitation. No evidence of mitral valve stenosis. Tricuspid Valve: The tricuspid valve is normal in structure. Tricuspid valve regurgitation is mild . No evidence of tricuspid stenosis. Aortic Valve: The aortic valve is normal in structure. Aortic valve regurgitation is not visualized. No aortic stenosis is present. Aortic valve mean gradient measures 4.5 mmHg. Aortic valve peak gradient measures 8.3 mmHg. Aortic valve area, by VTI measures 2.02 cm. Pulmonic Valve: The pulmonic valve was normal in structure. Pulmonic valve regurgitation is not visualized. No evidence of pulmonic stenosis. Aorta: The aortic root is normal in size and structure. Venous: The inferior vena cava was not well visualized. IAS/Shunts: No atrial level shunt detected by color flow Doppler.  LEFT VENTRICLE PLAX 2D LVIDd:         4.70 cm LV PW:         0.90 cm LV IVS:        1.10 cm LVOT diam:     1.90 cm LV SV:         42 LV SV Index:   33 LVOT Area:     2.84 cm  RIGHT VENTRICLE             IVC RV S prime:     17.70 cm/s  IVC diam: 1.60 cm TAPSE (M-mode): 1.8 cm LEFT ATRIUM             Index        RIGHT ATRIUM          Index LA diam:        3.40 cm 2.66 cm/m   RA Area:     8.19 cm LA Vol (A2C):   44.7 ml 34.94 ml/m  RA Volume:   16.30 ml 12.74 ml/m LA Vol (A4C):   23.7 ml 18.52 ml/m LA Biplane  Vol: 35.0  ml 27.36 ml/m  AORTIC VALVE AV Area (Vmax):    2.33 cm AV Area (Vmean):   2.26 cm AV Area (VTI):     2.02 cm AV Vmax:           144.00 cm/s AV Vmean:          101.200 cm/s AV VTI:            0.208 m AV Peak Grad:      8.3 mmHg AV Mean Grad:      4.5 mmHg LVOT Vmax:         118.50 cm/s LVOT Vmean:        80.500 cm/s LVOT VTI:          0.148 m LVOT/AV VTI ratio: 0.71  AORTA Ao Root diam: 3.50 cm MV E velocity: 108.00 cm/s  TRICUSPID VALVE                             TR Peak grad:   20.1 mmHg                             TR Vmax:        224.00 cm/s                              SHUNTS                             Systemic VTI:  0.15 m                             Systemic Diam: 1.90 cm Skeet Latch MD Electronically signed by Skeet Latch MD Signature Date/Time: 09/06/2022/4:25:33 PM    Final    CT Head Wo Contrast  Result Date: 09/06/2022 CLINICAL DATA:  86 year old female with altered mental status change. Status post CPR. EXAM: CT HEAD WITHOUT CONTRAST TECHNIQUE: Contiguous axial images were obtained from the base of the skull through the vertex without intravenous contrast. RADIATION DOSE REDUCTION: This exam was performed according to the departmental dose-optimization program which includes automated exposure control, adjustment of the mA and/or kV according to patient size and/or use of iterative reconstruction technique. COMPARISON:  None Available. FINDINGS: Brain: There is confluent and slightly asymmetric bilateral cerebral white matter hypodensity, greater in the right hemisphere (series 3, images 22, 27). But No midline shift, mass effect, or evidence of intracranial mass lesion. No acute intracranial hemorrhage identified. Ventricle size and configuration within normal limits for age. Basilar cisterns are normal. No superimposed cortically based acute infarct identified. And the deep gray nuclei, brainstem and cerebellum appear negative for age. Vascular: Mild Calcified  atherosclerosis at the skull base. No suspicious intracranial vascular hyperdensity. Skull: No acute osseous abnormality identified. Sinuses/Orbits: Visualized paranasal sinuses and mastoids are clear. Other: No acute orbit or scalp soft tissue finding. IMPRESSION: No prior study for comparison. Confluent cerebral white matter hypodensity, but without intracranial mass effect or obvious cerebral edema, is nonspecific but most commonly due to chronic small vessel disease. If the patient's mental status does not improve, a repeat noncontrast head CT in 24-48 hours, or alternatively a noncontrast Brain MRI would be valuable. Electronically Signed   By: Genevie Ann M.D.   On: 09/06/2022 07:19   DG  Chest Port 1 View  Result Date: 09/06/2022 CLINICAL DATA:  Unresponsive EXAM: PORTABLE CHEST 1 VIEW COMPARISON:  05/13/2022 FINDINGS: Mild cardiac enlargement. No vascular congestion, edema, or consolidation. Emphysematous changes in the lungs. Scarring in the lung apices. No pleural effusions. No pneumothorax. Mediastinal contours appear intact. Calcification of the aorta. Old fracture deformity of the right shoulder. IMPRESSION: Emphysematous changes and scattered fibrosis in the lungs. No focal consolidation. Electronically Signed   By: Lucienne Capers M.D.   On: 09/06/2022 01:29    LABORATORY DATA: Lab Results  Component Value Date   WBC 7.5 09/07/2022   HGB 11.3 (L) 09/07/2022   HCT 33.4 (L) 09/07/2022   MCV 101.5 (H) 09/07/2022   PLT 173 09/07/2022    Recent Labs  Lab 09/06/22 0104 09/07/22 0143  NA 133* 140  K 3.3* 3.9  CL 94* 101  CO2 27 31  BUN 10 8  CREATININE 1.04* 0.61  CALCIUM 9.0 8.7*  PROT 5.0*  --   BILITOT 0.7  --   ALKPHOS 34*  --   ALT 14  --   AST 14*  --   GLUCOSE 178* 95    Lipid Panel  No results found for: "CHOL", "HDL", "LDLCALC", "LDLDIRECT", "TRIG", "CHOLHDL"  BNP (last 3 results) Recent Labs    03/16/22 1810  BNP 147.9*    HEMOGLOBIN A1C No results found  for: "HGBA1C", "MPG"  Cardiac Panel (last 3 results) Recent Labs    09/06/22 0104 09/06/22 0314  TROPONINIHS 20* 29*     TSH Recent Labs    03/16/22 2155 05/13/22 1859  TSH 1.194 1.292     CARDIAC DATABASE: EKG: 09/07/2022: Sinus rhythm, left bundle branch block, 76 bpm, without underlying injury pattern.  Echocardiogram: 09/06/2022: LVEF 20-25%, global hypokinesis, mild LVH, mild LAE, trivial MR, trivial pericardial effusion. ~Personally reviewed the echocardiogram which notes severely reduced LVEF with regional wall motion normalities.  IMPRESSION & RECOMMENDATIONS: Carol A Mathurin is a 86 y.o. Caucasian female whose past medical history and cardiovascular risk factors include: COPD, HTN, HLD, current smoker, advanced age..  Impression:  Newly discovered cardiomyopathy. Newly discovered HFrEF Elevated troponins. Aortic atherosclerosis and coronary artery calcification (CT chest 06/2022) Acute encephalopathy-improving. Acute kidney injury- resolved Protein calorie malnutrition Hypertension. Hyperlipidemia. Current smoker. COPD/emphysema.   Plan:  Newly discovered cardiomyopathy / HFrEF. Not in overt heart failure. Discovered as work-up of altered mental status. Not on pressor support, currently tachycardic, renal function and LFTs within acceptable limits. Check BNP Net IO Since Admission: 776.05 mL [09/07/22 1656] Spoke to patient, her son Carol [name], and 2 granddaughters at bedside.   Recommend up titration of GDMT to maximally tolerated doses. I spoke to the patient's son and both granddaughters that given her underlying cardiomyopathy angiography would be advised to rule out obstructive CAD especially in the setting of reduced EF and regional wall motion abnormalities.  However, given her advanced age, frailty, comorbid conditions, goals of care need to be addressed and outlined. Family informs me that she is now DNR/DNI and would like to favor conservative  management and avoid invasive procedures.  Palliative care is on board to help guide discussions and goals of care.  This is quite appropriate given the clinical setting.  However, there are other siblings involved in the patient's care they would like to discuss it further prior to making a final decision with regards to angiography (will follow up).  Unable to comment on diastology due to sub-optimal image acquisition Strict I's and O's, daily  weights. Start Lasix 20 mg IV push p.o. every morning. Start Corlanor 5 mg p.o. twice daily. Start Farxiga 10 mg p.o. daily Continue telemetry. Monitor for now.  Elevated troponins:  Secondary to supply demand ischemia in the setting of cardiomyopathy/acute kidney injury/tachycardia.   Patient denies any active chest pain.  Aortic atherosclerosis and coronary artery calcification: Noted on prior nongated CT study. Start aspirin 81 mg p.o. daily.  Continue home dose statin therapy.  Acute metabolic encephalopathy- resolved  Acute kidney injury- resolved  Protein calorie malnutrition management per primary  Hypertension: Medication changes as discussed above.  Discontinue home dose of amlodipine.  Hyperlipidemia: Continue home dose of statin therapy.  Check fasting lipid profile and direct LDL (for risk stratification)  Cigarette smoking /COPD/emphysema: Smoke few cigarettes per day. Reemphasized importance of complete cessation. Recommend follow-up with pulmonary medicine as outpatient.  The events leading up to this hospitalization are unclear.  Patient denies experiencing loss of consciousness.  However, since she came in with altered mental status cannot rule out the possibility of pulmonary embolism.  Recommended to critical care medicine to  start with checking a D-dimer and if elevated testing may be warranted.  Total encounter time 84 minutes. *Total Encounter Time as defined by the Centers for Medicare and Medicaid Services includes,  in addition to the face-to-face time of a patient visit (documented in the note above) non-face-to-face time: obtaining and reviewing outside history, ordering and reviewing medications, tests or procedures, care coordination (communications with CCM providers/residents, Son Carol [name]) and granddaughters) and documentation in the medical record.  This note was created using a voice recognition software as a result there may be grammatical errors inadvertently enclosed that do not reflect the nature of this encounter. Every attempt is made to correct such errors.  Mechele [facility]  Pager: [phone] Office: [phone] 09/07/2022, 4:56 PM

## 2022-09-07 NOTE — Progress Notes (Signed)
Initial Nutrition Assessment  DOCUMENTATION CODES:   Underweight, Severe malnutrition in context of chronic illness  INTERVENTION:   - Ensure Enlive po BID, each supplement provides 350 kcal and 20 grams of protein  - Magic Cup TID with meals, each supplement provides 290 kcal and 9 grams of protein  - MVI with minerals daily  - Provided "High Calorie, High Protein Nutrition Therapy" handout and diet education  Monitor magnesium, potassium, and phosphorus BID for at least 3 days, MD to replete as needed, as pt is at risk for refeeding syndrome given severe malnutrition.  NUTRITION DIAGNOSIS:   Severe Malnutrition related to chronic illness (dysphagia, COPD) as evidenced by severe muscle depletion, severe fat depletion, percent weight loss (21.8% weight loss in 6 months).  GOAL:   Patient will meet greater than or equal to 90% of their needs  MONITOR:   PO intake, Supplement acceptance, Labs, Weight trends  REASON FOR ASSESSMENT:   Consult Assessment of nutrition requirement/status  ASSESSMENT:   86 year old female who presented to the ED on 10/12 after being found unresponsive by family. PMH of COPD, depression, anxiety, HTN, hyponatremia. Pt admitted with acute metabolic encephalopathy, shock.  10/13 - diet advanced to dysphagia 2 with thin liquids  Discussed pt with RN and during ICU rounds. Consult received for assessment. Noted Palliative Medicine team is involved in pt's care.  Spoke with pt and several family members at bedside. Family reports pt has had issues swallowing for a while now and this has impacted her PO intake. Family states that pt only eats foods that are "soggy" (like cereal that has soaked in milk) or chopped/smashed up. Pt does not eat bread and hast not been eating much chicken. Family reports that pt's dentures don't fit after her weight loss. They have made an appointment to have new dentures made outpatient. Pt will drink 1 Carnation Instant  Breakfast a day.  Family reports that when they are at her house, pt usually eats 3-4 times a day. Breakfast is usually a scrambled egg. Lunch is usually applesauce. Dinner is usually meatloaf and mashed potatoes. Pt may eat peaches or ice cream for an evening snack.  Family states that pt's recent UBW was 110-115 lbs and that she last weighed this in December 2022. Pt shares that she used to weigh as much as 150 lbs but that this was a long time ago.  Reviewed weight history in chart. Pt with a 10.4 kg weight loss since 03/16/22. This is a 21.8% weight loss in less than 6 months which is severe and significant for timeframe. Pt meets criteria for severe malnutrition.  Pt with dysphagia 2 lunch meal tray at bedside. Pt had completed ~90% of Magic Cup. Family would like her to receive these on all meal trays. Pt and family also interested in trying strawberry Ensure supplements. RD to order. Will also order daily MVI with minerals.  Provided pt's family with "High Calorie, High Protein Nutrition Therapy" handout and diet education.  Medications reviewed and include: remeron, IV protonix, IV magnesium sulfate 4 grams once IVF: D10 @ 40 ml/hr  Labs reviewed: magnesium 1.4  UOP: 2275 ml x 24 hours I/O's: +1.0 L since admit  NUTRITION - FOCUSED PHYSICAL EXAM:  Flowsheet Row Most Recent Value  Orbital Region Severe depletion  Upper Arm Region Moderate depletion  Thoracic and Lumbar Region Severe depletion  Buccal Region Severe depletion  Temple Region Severe depletion  Clavicle Bone Region Severe depletion  Clavicle and Acromion Bone Region  Severe depletion  Scapular Bone Region Severe depletion  Dorsal Hand Severe depletion  Patellar Region Severe depletion  Anterior Thigh Region Severe depletion  Posterior Calf Region Severe depletion  Edema (RD Assessment) None  Hair Reviewed  Eyes Reviewed  Mouth Reviewed  Skin Reviewed  Nails Reviewed       Diet Order:   Diet Order              DIET DYS 2 Room service appropriate? Yes; Fluid consistency: Thin  Diet effective now                   EDUCATION NEEDS:   Education needs have been addressed  Skin:  Skin Assessment: Reviewed RN Assessment  Last BM:  no documented BM  Height:   Ht Readings from Last 1 Encounters:  09/06/22 5' (1.524 m)    Weight:   Wt Readings from Last 1 Encounters:  09/06/22 37.3 kg    Ideal Body Weight:  45.5 kg  BMI:  Body mass index is 16.06 kg/m.  Estimated Nutritional Needs:   Kcal:  1400-1600  Protein:  60-75 grams  Fluid:  1.4-1.6 L    Gustavus Bryant, MS, RD, LDN Inpatient Clinical Dietitian Please see [facility] for contact information.

## 2022-09-07 NOTE — Progress Notes (Signed)
Order placed for foley cath removal, patient also ordered lasix. Nurse will pass on to remove foley to next nurse once voiding slow down with patient. Will continue to monitor.

## 2022-09-07 NOTE — Evaluation (Signed)
Physical Therapy Evaluation Patient Details Name: Carol [name] MRN: [id number] DOB: 1936/05/31 Today's Date: 09/07/2022  History of Present Illness  86 year old female who presents after being found unresponsive at home overnight in morning of 11/12. Daughter performed CPR briefly. Unclear if she truly lost pulses. She did have spontaneous circulation when EMS arrived. Pt with pna in April of this year and since then has grown progressively weak, has struggled with protein calorie malnutrition, and has about a 20 pound unintentional weight loss. PMH of COPD, HTN, hyponatremia, and recent admission for pneumonia in April of this year, Anxiety, Arthritis, Colitis, Complication of anesthesia, GERD, Headache, Hypertension, and Urinary tract infection (early aug 2014).  Clinical Impression  Pt admitted with above diagnosis. Pt was able to ambulate with  RW with min to min guard assist and cues for safety. Pt fatigues and does have some SOB with activity.  HR incr as well.  O2 saturations not picking up well but did ambulate on 3LO2 as pt used O2 at home. Will progress pt as able. Pt currently with functional limitations due to the deficits listed below (see PT Problem List). Pt will benefit from skilled PT to increase their independence and safety with mobility to allow discharge to the venue listed below.          Recommendations for follow up therapy are one component of a multi-disciplinary discharge planning process, led by the attending physician.  Recommendations may be updated based on patient status, additional functional criteria and insurance authorization.  Follow Up Recommendations Home health PT (with 24 hour care)      Assistance Recommended at Discharge Frequent or constant Supervision/Assistance  Patient can return home with the following  A little help with walking and/or transfers;A little help with bathing/dressing/bathroom;Assistance with cooking/housework;Assist for  transportation;Help with stairs or ramp for entrance    Equipment Recommendations None recommended by PT  Recommendations for Other Services       Functional Status Assessment Patient has had a recent decline in their functional status and demonstrates the ability to make significant improvements in function in a reasonable and predictable amount of time.     Precautions / Restrictions Precautions Precautions: Fall Restrictions Weight Bearing Restrictions: No      Mobility  Bed Mobility Overal bed mobility: Independent             General bed mobility comments: incr time    Transfers Overall transfer level: Needs assistance Equipment used: Rolling walker (2 wheels) Transfers: Sit to/from Stand Sit to Stand: Min guard           General transfer comment: guard assist to steady initially and cues for hand placement    Ambulation/Gait Ambulation/Gait assistance: Min guard, Min assist Gait Distance (Feet): 90 Feet Assistive device: Rolling walker (2 wheels) Gait Pattern/deviations: Step-through pattern, Decreased stride length, Trunk flexed   Gait velocity interpretation: <1.31 ft/sec, indicative of household ambulator   General Gait Details: Pt was able to ambulate with RW into hallway fatiguing as she incr distance.  Pt leans forward in posture as she gets tired. Pt needing stedying assisst as she fatigued.  Stairs            Wheelchair Mobility    Modified Rankin (Stroke Patients Only)       Balance Overall balance assessment: Needs assistance Sitting-balance support: No upper extremity supported, Feet supported Sitting balance-Leahy Scale: Fair     Standing balance support: Bilateral upper extremity supported, During functional activity Standing  balance-Leahy Scale: Poor Standing balance comment: relies on UE support for balance                             Pertinent Vitals/Pain Pain Assessment Pain Assessment: No/denies pain     Home Living Family/patient expects to be discharged to:: Private residence Living Arrangements: Alone Available Help at Discharge: Family;Other (Comment) (Son/daughter take turns staying with pt 24 hours/day) Type of Home: House Home Access: Stairs to enter Entrance Stairs-Rails: None Entrance Stairs-Number of Steps: 3   Home Layout: One level Home Equipment: Conservation officer, nature (2 wheels);BSC/3in1;Grab bars - tub/shower      Prior Function Prior Level of Function : Independent/Modified Independent             Mobility Comments: walks without an assistive device typically ADLs Comments: no difficulty     Hand Dominance        Extremity/Trunk Assessment   Upper Extremity Assessment Upper Extremity Assessment: Defer to OT evaluation    Lower Extremity Assessment Lower Extremity Assessment: Generalized weakness    Cervical / Trunk Assessment Cervical / Trunk Assessment: Kyphotic  Communication   Communication: No difficulties  Cognition Arousal/Alertness: Awake/alert Behavior During Therapy: WFL for tasks assessed/performed Overall Cognitive Status: Within Functional Limits for tasks assessed                                          General Comments General comments (skin integrity, edema, etc.): HR to 140's with activity.  Pt on 3LO2 but pulse ox was not picking up well as it never had a good waveform.    Exercises General Exercises - Lower Extremity Ankle Circles/Pumps: AROM, Both, 5 reps, Seated Long Arc Quad: AROM, Both, 10 reps, Seated   Assessment/Plan    PT Assessment Patient needs continued PT services  PT Problem List Decreased activity tolerance;Decreased balance;Decreased mobility;Decreased knowledge of use of DME;Decreased safety awareness;Decreased knowledge of precautions;Cardiopulmonary status limiting activity       PT Treatment Interventions DME instruction;Gait training;Functional mobility training;Therapeutic  activities;Therapeutic exercise;Balance training;Patient/family education    PT Goals (Current goals can be found in the Care Plan section)  Acute Rehab PT Goals Patient Stated Goal: to go home PT Goal Formulation: With patient Time For Goal Achievement: 09/21/22 Potential to Achieve Goals: Good    Frequency Min 3X/week     Co-evaluation               AM-PAC PT "6 Clicks" Mobility  Outcome Measure Help needed turning from your back to your side while in a flat bed without using bedrails?: A Little Help needed moving from lying on your back to sitting on the side of a flat bed without using bedrails?: A Little Help needed moving to and from a bed to a chair (including a wheelchair)?: A Lot Help needed standing up from a chair using your arms (e.g., wheelchair or bedside chair)?: A Lot Help needed to walk in hospital room?: A Little Help needed climbing 3-5 steps with a railing? : Total 6 Click Score: 14    End of Session Equipment Utilized During Treatment: Gait belt;Oxygen Activity Tolerance: Patient limited by fatigue Patient left: in bed;with call bell/phone within reach;with bed alarm set;with nursing/sitter in room;with SCD's reapplied Nurse Communication: Mobility status PT Visit Diagnosis: Muscle weakness (generalized) (M62.81)    Time: 5009-3818 PT Time  Calculation (min) (ACUTE ONLY): 27 min   Charges:   PT Evaluation $PT Eval Moderate Complexity: 1 Mod PT Treatments $Gait Training: 8-22 mins        [facility] M,PT Acute Rehab Services [phone]   Alvira Philips 09/07/2022, 3:41 PM

## 2022-09-07 NOTE — Progress Notes (Signed)
[facility] ADULT ICU REPLACEMENT PROTOCOL   The patient does apply for the [facility] Adult ICU Electrolyte Replacment Protocol based on the criteria listed below:   1.Exclusion criteria: TCTS patients, ECMO patients, and Dialysis patients 2. Is GFR >/= 30 ml/min? Yes.    Patient's GFR today is >60 3. Is SCr </= 2? Yes.   Patient's SCr is 0.61 mg/dL 4. Did SCr increase >/= 0.5 in 24 hours? No. 5.Pt's weight >40kg  Yes.   6. Abnormal electrolyte(s): mag 1.4  7. Electrolytes replaced per protocol 8.  Call MD STAT for K+ </= 2.5, Phos </= 1, or Mag </= 1 Physician:  n/a  Carol [name] 09/07/2022 3:30 AM

## 2022-09-07 NOTE — Evaluation (Signed)
Clinical/Bedside Swallow Evaluation Patient Details  Name: Carol [name] MRN: [id number] Date of Birth: 05-09-1936  Today's Date: 09/07/2022 Time: SLP Start Time (ACUTE ONLY): 1108 SLP Stop Time (ACUTE ONLY): 1137 SLP Time Calculation (min) (ACUTE ONLY): 29 min  Past Medical History:  Past Medical History:  Diagnosis Date   Anxiety    Arthritis    arthritis -hips   Colitis    Complication of anesthesia    B/P dropped post colonoscopy x1   GERD (gastroesophageal reflux disease)    Headache(784.0)    migraines rare occ.   Hypertension    Urinary tract infection early aug 2014   Past Surgical History:  Past Surgical History:  Procedure Laterality Date   CATARACT EXTRACTION, BILATERAL Bilateral    COLONOSCOPY WITH PROPOFOL N/A 08/03/2013   Procedure: COLONOSCOPY WITH PROPOFOL;  Surgeon: Winfield Cunas., MD;  Location: [facility] ENDOSCOPY;  Service: Endoscopy;  Laterality: N/A;  ultra thin colon scope   COLONOSCOPY WITH PROPOFOL N/A 02/04/2018   Procedure: COLONOSCOPY WITH PROPOFOL;  Surgeon: Laurence Spates, MD;  Location: [facility] ENDOSCOPY;  Service: Endoscopy;  Laterality: N/A;   LASIK     HPI:  86 year old female who presents after being found unresponsive at home overnight in morning of 11/12. Daughter performed CPR briefly. Unclear if she truly lost pulses. She did have spontaneous circulation when EMS arrived. Pt with pna in April of this year and since then has grown progressively weak, has struggled with protein calorie malnutrition, and has about a 20 pound unintentional weight loss. Pt with with PMH of COPD, HTN, hyponatremia, and recent admission for pneumonia in April of this year, Anxiety, Arthritis, Colitis, Complication of anesthesia, GERD, Headache, Hypertension, and Urinary tract infection (early aug 2014).    Assessment / Plan / Recommendation  Clinical Impression  Pt presents with a mild oral dysphagia which is in part 2/2 edentulism.  Oral care provided prior to  administration of PO trials. Pt with xerostomia and dried secretions. Continue regular, thorough oral care.  Pt reports eating softer foods at baseline.  Family says she does not eat breads, but will eat hamburger without bun.  Pt expectorated regular texture solids.  She exhibited adequate oral clearance of simulated ground consistency.  There were no clinical s/s of aspiration with puree or solid textures. Pharyngeal phase c/b multiple swallows.  There was a delayed throat clear x2 with thin liquid.  Belching noted.  Pt with hx of GERD.  Pt reports difficutly with solids more than liquids.  Pt's description of symptoms suggest an esophageal dysphagia.  Pt with pna in April, but clear chest imaging in June. With this admission CXR 10/12: "Emphysematous changes and scattered fibrosis in the lungs. No focal consolidation." Pt previously canceled GI appointment and granddaughter reports rescheduled for 10/17.  Barium swallow orders for 10/17 in chart. Recommend further assessment of esophageal while pt is in house if possible.  Pt has had significant weight loss and may benefit from assessment by dieticians as well to optimize nutrition.    Recommend ground/chopped (dysphagia 2) diet with thin liquids.  Administer medicaitons whole in puree.  SLP Visit Diagnosis: Dysphagia, oral phase (R13.11);Dysphagia, unspecified (R13.10)    Aspiration Risk  Mild aspiration risk    Diet Recommendation Dysphagia 2 (Fine chop);Thin liquid   Liquid Administration via: Cup;Straw Medication Administration: Whole meds with puree Supervision: Staff to assist with self feeding Compensations: Slow rate;Small sips/bites Postural Changes: Seated upright at 90 degrees;Remain upright for at least 30  minutes after po intake    Other  Recommendations Recommended Consults: Consider esophageal assessment Oral Care Recommendations: Oral care BID    Recommendations for follow up therapy are one component of a multi-disciplinary  discharge planning process, led by the attending physician.  Recommendations may be updated based on patient status, additional functional criteria and insurance authorization.  Follow up Recommendations  (TBD)      Assistance Recommended at Discharge Frequent or constant Supervision/Assistance  Functional Status Assessment Patient has had a recent decline in their functional status and demonstrates the ability to make significant improvements in function in a reasonable and predictable amount of time.  Frequency and Duration min 2x/week  2 weeks       Prognosis Prognosis for Safe Diet Advancement:  (Diet seemingly consistent with baseline)      Swallow Study   General Date of Onset: 09/05/22 HPI: 86 year old female who presents after being found unresponsive at home overnight in morning of 11/12. Daughter performed CPR briefly. Unclear if she truly lost pulses. She did have spontaneous circulation when EMS arrived. Pt with pna in April of this year and since then has grown progressively weak, has struggled with protein calorie malnutrition, and has about a 20 pound unintentional weight loss. Pt with with PMH of COPD, HTN, hyponatremia, and recent admission for pneumonia in April of this year, Anxiety, Arthritis, Colitis, Complication of anesthesia, GERD, Headache, Hypertension, and Urinary tract infection (early aug 2014). Type of Study: Bedside Swallow Evaluation Previous Swallow Assessment: NOne Diet Prior to this Study: NPO Temperature Spikes Noted: No Respiratory Status: Nasal cannula History of Recent Intubation: No Behavior/Cognition: Alert;Cooperative;Pleasant mood Oral Cavity Assessment: Dried secretions Oral Care Completed by SLP: Yes Oral Cavity - Dentition: Edentulous (dentures but with poor fit) Vision: Functional for self-feeding Self-Feeding Abilities: Needs assist Patient Positioning: Upright in bed Baseline Vocal Quality: Low vocal intensity Volitional Cough: Strong  (with encouragement) Volitional Swallow: Unable to elicit (2/2 xerostomia)    Oral/Motor/Sensory Function Overall Oral Motor/Sensory Function: Within functional limits Facial ROM: Within Functional Limits Facial Symmetry: Within Functional Limits Lingual ROM: Within Functional Limits Lingual Symmetry: Within Functional Limits Lingual Strength: Reduced Velum: Within Functional Limits Mandible: Within Functional Limits   Ice Chips Ice chips: Not tested   Thin Liquid Thin Liquid: Impaired Presentation: Straw Pharyngeal  Phase Impairments: Throat Clearing - Delayed    Nectar Thick Nectar Thick Liquid: Not tested   Honey Thick Honey Thick Liquid: Not tested   Puree Puree: Within functional limits   Solid     Solid: Impaired Oral Phase Functional Implications: Impaired mastication;Oral residue      Celedonio Savage, MA, [place] Office: [phone] 09/07/2022,12:00 PM

## 2022-09-07 NOTE — Progress Notes (Addendum)
NAME:  Carol [name], MRN:  [id number], DOB:  06/22/1936, LOS: 1 ADMISSION DATE:  09/06/2022, CONSULTATION DATE:  09/06/2022 REFERRING MD:  EDP, CHIEF COMPLAINT:  Altered mental status    History of Present Illness:  86 year old female with PMH as below, which is significant for COPD, HTN, hyponatremia, and recent admission for pneumonia in April of this year. Since that time she has grown progressively weak, has struggled with protein calorie malnutrition, and has about a 20 pound unintentional weight loss.  She was in her usual state of health until 10/11 when she developed weakness and confusion. Her daughter was staying with her. Went to bed as usual. Her daughter heard her fall out of bed early AM 10/12. She was not responding. Daughter performed CPR briefly. Unclear if she truly lost pulses. She did have spontaneous circulation when EMS arrived. Upon arrival to the ED she was unresponsive. Family endorsed DNR/DNI status. Hypotensive despite 3 L IVF and started on levophed. Family concerned about benzodiazepine as the patient takes scheduled ativan. UDS positive for only benzo. Other laboratory data significant for Na 133, K 3.3, Lactic acid 2.2, WBC 8.7, Hgb 11.1. PCCM asked to evaluate patient for ICU admission.   Pertinent  Medical History   Anxiety, Arthritis, Colitis, Complication of anesthesia, GERD (gastroesophageal reflux disease), Headache(784.0), Hypertension, and Urinary tract infection (early aug 2014)  Significant Hospital Events: Including procedures, antibiotic start and stop dates in addition to other pertinent events   10/12: Patient admitted to ICU   Interim History / Subjective:  Overnight patient has been weaned off of Precedex.  Patient is awake and alert this morning.  Patient is alert and oriented x3.  Can follow commands.  She reports that she is feeling fine.  She denies any shortness of breath, chest pain, palpitations, lightheadedness, dizziness.  Patient does  report that she has been weak and has not had a good appetite for the past 7 to 8 months.  Patient had a weight loss of 20 pounds in the past 7 to 8 months.  Currently, her only complaint is that she has to urinate.  No other concerns at this time. I do speak to Timmothy Sours, son at bedside, who states that the patient could be depressed.  Patient had a couple family losses with most recent being nephew 3 months ago.  Patient herself reports she is depressed.  Objective   Blood pressure (!) 151/69, pulse 99, temperature 98.4 F (36.9 C), resp. rate (!) 31, height 5' (1.524 m), weight 37.3 kg, SpO2 94 %.        Intake/Output Summary (Last 24 hours) at 09/07/2022 1124 Last data filed at 09/07/2022 0900 Gross per 24 hour  Intake 2145.55 ml  Output 2850 ml  Net -704.45 ml   Filed Weights   09/06/22 0058  Weight: 37.3 kg    Examination:  General: Patient is resting comfortably in bed in no acute distress  Eyes: Pupils equal and reactive to light Head: Normocephalic, atraumatic  Neck: Supple, nontender, full range of motion, No JVD Cardio: Regular rate and rhythm, no murmurs, rubs or gallops. 2+ pulses to bilateral upper and lower extremities  Chest: No chest tenderness Pulmonary: Clear to ausculation bilaterally with no rales, rhonchi, and crackles  Abdomen: Soft, nontender with normoactive bowel sounds  Neuro: Alert and orientated x3. MSK: 4/5 strength to upper and lower extremities.   Resolved Hospital Problem list   Hypokalemia AKI  Assessment & Plan:  This is a 86 year old  female with a past medical history of COPD, hypertension, hyponatremia who presents to the emergency room with concerns of altered mental status.  Patient required some CPR at home, but unclear if patient actually needed it.  Patient was unresponsive, and hypotensive, admitted for further work-up of syncope.  #HFrEF  #Cardiogenic shock vs hypovolemic shock  #? Syncopal episode Patient was found down.  Unclear  history on what may have happened, patient stating she tripped, but family does not believe this.  Patient was found unresponsive.  Chest x-ray negative.  CT head negative.  Patient found to have echo showing low ejection fraction of 20 to 25%.  No history of cardiac failure.  Blood pressures currently in the 130s stable, off any pressors.  On exam, patient is alert and oriented x3, able to follow all commands, not agitated, or delirious.  No JVP or edema noted to lower extremities.  Lung sounds clear.  Current etiology either hypovolemic shock, due to decreased appetite versus cardiogenic shock due to significantly decreased EF.  This could also be obstructive shock from possible PE, obtain  D-dimer. Consulting cardiology today given new reduced ejection fraction. -Continue monitoring blood pressure -Consider cardiology consult -Consider transferring out of ICU today as off pressors -D-dimer elevated at 1.28 -Will plan for lower extremity vascular studies, can consider CTA tomorrow per primary team as less likely PE related.    #Anxiety/depression Patient reports anxiety and depression.  Patient also reports decreased appetite.  Will resume home meds -Taper off venlafaxine and start mirtazapine 7.5 mg at bedtime. -Can continue lorazepam, but at half dose, 0.5 mg 3 times daily as needed -Resume home Buspar   #Dysphagia #Weight loss Patient reports having trouble swallowing.  This has been going on for awihle.  This could be etiology of weight loss of 20 pounds in the last 7 to 8 months.  We will get speech eval for further evaluation. -Speech evaluation -Continue to monitor -Start mirtazapine to help stimulate appetite -Consult dietitian  #Hypokalemia, resolved #Hypomagnesemia -Replete mag and recheck in am   #COPD #Chronic hypoxic respiratory failure No acute exacerbation at this time.  Patient denies any shortness of breath.  At home, patient on Trelegy Ellipta.  Per son at bedside,  patient is tapering off oxygen.  Patient is on 3 L nasal cannula currently.  We will continue to wean off if tolerated. -Continue to wean oxygen -Oxygenation goal between 88 to 92% -Continue home Trelegy (not on formulary, can use Breo and Incruse) -Continue to monitor respiratory status  Best Practice (right click and "Reselect all SmartList Selections" daily)   Diet/type: NPO DVT prophylaxis: LMWH GI prophylaxis: PPI Lines: N/A Foley:  N/A Code Status:  DNR Last date of multidisciplinary goals of care discussion [09/06/2022]  Labs   CBC: Recent Labs  Lab 09/06/22 0104 09/07/22 0143  WBC 8.7 7.5  NEUTROABS 6.2  --   HGB 11.1* 11.3*  HCT 32.3* 33.4*  MCV 100.9* 101.5*  PLT 193 540    Basic Metabolic Panel: Recent Labs  Lab 09/06/22 0104 09/07/22 0143  NA 133* 140  K 3.3* 3.9  CL 94* 101  CO2 27 31  GLUCOSE 178* 95  BUN 10 8  CREATININE 1.04* 0.61  CALCIUM 9.0 8.7*  MG  --  1.4*  PHOS  --  4.2   GFR: Estimated Creatinine Clearance: 31.4 mL/min (by C-G formula based on SCr of 0.61 mg/dL). Recent Labs  Lab 09/06/22 0104 09/06/22 0314 09/07/22 0143  WBC 8.7  --  7.5  LATICACIDVEN 2.2* 1.6  --     Liver Function Tests: Recent Labs  Lab 09/06/22 0104  AST 14*  ALT 14  ALKPHOS 34*  BILITOT 0.7  PROT 5.0*  ALBUMIN 3.0*   No results for input(s): "LIPASE", "AMYLASE" in the last 168 hours. Recent Labs  Lab 09/06/22 0822  AMMONIA <10    ABG No results found for: "PHART", "PCO2ART", "PO2ART", "HCO3", "TCO2", "ACIDBASEDEF", "O2SAT"   Coagulation Profile: Recent Labs  Lab 09/06/22 0822  INR 1.2    Cardiac Enzymes: No results for input(s): "CKTOTAL", "CKMB", "CKMBINDEX", "TROPONINI" in the last 168 hours.  HbA1C: No results found for: "HGBA1C"  CBG: Recent Labs  Lab 09/06/22 1943 09/06/22 2329 09/07/22 0333  GLUCAP 72 82 80    Review of Systems:   Negative except for what is stated in the HPI.  Past Medical History:  She,  has a  past medical history of Anxiety, Arthritis, Colitis, Complication of anesthesia, GERD (gastroesophageal reflux disease), Headache(784.0), Hypertension, and Urinary tract infection (early aug 2014).   Surgical History:   Past Surgical History:  Procedure Laterality Date   CATARACT EXTRACTION, BILATERAL Bilateral    COLONOSCOPY WITH PROPOFOL N/A 08/03/2013   Procedure: COLONOSCOPY WITH PROPOFOL;  Surgeon: Winfield Cunas., MD;  Location: [facility];  Service: Endoscopy;  Laterality: N/A;  ultra thin colon scope   COLONOSCOPY WITH PROPOFOL N/A 02/04/2018   Procedure: COLONOSCOPY WITH PROPOFOL;  Surgeon: Laurence Spates, MD;  Location: [facility];  Service: Endoscopy;  Laterality: N/A;   LASIK       Social History:   reports that she has been smoking cigarettes. She has a 10.00 pack-year smoking history. She has never used smokeless tobacco. She reports that she does not drink alcohol and does not use drugs.   Family History:  Her family history is not on file.   Allergies Allergies  Allergen Reactions   Ciprofloxacin Nausea Only and Other (See Comments)    Nausea and legs weak   Nitrofuran Derivatives Nausea Only and Other (See Comments)    nitrofuratonin bad nausea, legs weak   Paxil [Paroxetine] Other (See Comments)    Sick on stomach   Penicillins Other (See Comments)    Sick on stomach   Sulfa Antibiotics Other (See Comments)    jittery     Home Medications  Prior to Admission medications   Medication Sig Start Date End Date Taking? Authorizing Provider  acetaminophen (TYLENOL) 650 MG CR tablet Take 1,300 mg by mouth daily as needed for pain.    Yes [provider]  amLODipine (NORVASC) 5 MG tablet Take 5 mg by mouth daily. 11/14/17  Yes [provider]  busPIRone (BUSPAR) 5 MG tablet Take 5 mg by mouth 2 (two) times daily. 08/27/22  Yes [provider]  Fluticasone-Umeclidin-Vilant (TRELEGY ELLIPTA) 100-62.5-25 MCG/ACT AEPB Inhale 28 each into  the lungs daily. 09/03/22  Yes Rigoberto Noel, MD  LORazepam (ATIVAN) 1 MG tablet Take 1 mg by mouth 3 (three) times daily. 08/25/22  Yes [provider]  pantoprazole (PROTONIX) 20 MG tablet Take 1 tablet (20 mg total) by mouth daily. Patient taking differently: Take 40 mg by mouth daily. 04/27/22  Yes Lacretia Leigh, MD  Probiotic Product (ALIGN PO) Take 1 tablet by mouth daily.   Yes [provider]  raloxifene (EVISTA) 60 MG tablet Take 60 mg by mouth daily. 12/30/17  Yes [provider]  simvastatin (ZOCOR) 20 MG tablet Take 20 mg  by mouth at bedtime.    Yes [provider]  Venlafaxine HCl 225 MG TB24 Take 225 mg by mouth every morning. 08/03/22  Yes [provider]  nicotine (NICODERM CQ - DOSED IN MG/24 HOURS) 21 mg/24hr patch Place 1 patch (21 mg total) onto the skin daily. Patient not taking: Reported on 09/06/2022 04/27/19   [name], MD  ondansetron (ZOFRAN) 4 MG tablet Take 1 tablet (4 mg total) by mouth every 8 (eight) hours as needed for nausea or vomiting. Patient not taking: Reported on 09/06/2022 05/13/22   Tegeler, Gwenyth Allegra, MD  umeclidinium bromide (INCRUSE ELLIPTA) 62.5 MCG/INH AEPB Inhale 1 puff into the lungs daily. Patient not taking: Reported on 09/06/2022 04/28/19   [name], MD     Critical care time: Tool, DO Internal Medicine Resident PGY-1 Pager: [phone]

## 2022-09-07 NOTE — TOC Initial Note (Signed)
Transition of Care [facility]) - Initial/Assessment Note    Patient Details  Name: Carol [name] MRN: [id number] Date of Birth: 02/25/1936  Transition of Care [facility] CM/SW Contact:    Ninfa Meeker, RN Phone Number: 09/07/2022, 1:37 PM  Clinical Narrative:   Transition of Care Screening Note:  Transition of Care Department [facility]) has reviewed patient and no TOC needs have been identified at this time. We will continue to monitor patient advancement through Interdisciplinary progressions. If new patient transition needs arise, please place a consult.                         Patient Goals and CMS Choice        Expected Discharge Plan and Services                                                Prior Living Arrangements/Services                       Activities of Daily Living      Permission Sought/Granted                  Emotional Assessment              Admission diagnosis:  Metabolic encephalopathy [J19.14] Hypokalemia [E87.6] Hyponatremia [E87.1] Elevated troponin [R79.89] Elevated lactic acid level [R79.89] Macrocytic anemia [D53.9] Hypotension, unspecified hypotension type [I95.9] AMS (altered mental status) [R41.82] Patient Active Problem List   Diagnosis Date Noted   AMS (altered mental status) 09/06/2022   Elevated lactic acid level    Elevated troponin    Hypotension    Metabolic encephalopathy    Goals of care, counseling/discussion    Chronic respiratory failure with hypoxia ([place]) 09/03/2022   Weight loss 09/03/2022   Protein-calorie malnutrition, severe 03/20/2022   Hyponatremia 03/17/2022   Abnormal CT of the chest 03/17/2022   COPD exacerbation ([place]) 03/16/2022   COPD (chronic obstructive pulmonary disease) ([place]): Probable 04/27/2019   Low vitamin B12 level 04/27/2019   Gastrointestinal hemorrhage    Hematochezia 04/24/2019   Tobacco abuse 04/24/2019   Anxiety 04/24/2019   Osteoporosis 04/24/2019    Essential hypertension 04/24/2019   Cystitis 04/24/2019   Tinea cruris 04/24/2019   Colitis 01/04/2018   Rectal bleeding 01/04/2018   GERD (gastroesophageal reflux disease) 01/04/2018   PCP:  Shirline Frees, MD Pharmacy:   [facility] [place], [address] [place] [address] Phone: [phone] Fax: [phone]  [facility] [address] [address] Phone: [phone] Fax: [phone]     Social Determinants of Health (SDOH) Interventions    Readmission Risk Interventions     No data to display

## 2022-09-08 ENCOUNTER — Inpatient Hospital Stay (HOSPITAL_COMMUNITY): Payer: Medicare Other

## 2022-09-08 DIAGNOSIS — R4182 Altered mental status, unspecified: Secondary | ICD-10-CM | POA: Diagnosis not present

## 2022-09-08 DIAGNOSIS — R7989 Other specified abnormal findings of blood chemistry: Secondary | ICD-10-CM | POA: Diagnosis not present

## 2022-09-08 DIAGNOSIS — F17210 Nicotine dependence, cigarettes, uncomplicated: Secondary | ICD-10-CM

## 2022-09-08 DIAGNOSIS — I5020 Unspecified systolic (congestive) heart failure: Secondary | ICD-10-CM

## 2022-09-08 DIAGNOSIS — I10 Essential (primary) hypertension: Secondary | ICD-10-CM

## 2022-09-08 DIAGNOSIS — E43 Unspecified severe protein-calorie malnutrition: Secondary | ICD-10-CM | POA: Diagnosis present

## 2022-09-08 LAB — CBC WITH DIFFERENTIAL/PLATELET
Abs Immature Granulocytes: 0.01 10*3/uL (ref 0.00–0.07)
Basophils Absolute: 0 10*3/uL (ref 0.0–0.1)
Basophils Relative: 0 %
Eosinophils Absolute: 0.1 10*3/uL (ref 0.0–0.5)
Eosinophils Relative: 1 %
HCT: 37.4 % (ref 36.0–46.0)
Hemoglobin: 12.5 g/dL (ref 12.0–15.0)
Immature Granulocytes: 0 %
Lymphocytes Relative: 17 %
Lymphs Abs: 1.4 10*3/uL (ref 0.7–4.0)
MCH: 33.7 pg (ref 26.0–34.0)
MCHC: 33.4 g/dL (ref 30.0–36.0)
MCV: 100.8 fL — ABNORMAL HIGH (ref 80.0–100.0)
Monocytes Absolute: 0.7 10*3/uL (ref 0.1–1.0)
Monocytes Relative: 9 %
Neutro Abs: 6 10*3/uL (ref 1.7–7.7)
Neutrophils Relative %: 73 %
Platelets: 210 10*3/uL (ref 150–400)
RBC: 3.71 MIL/uL — ABNORMAL LOW (ref 3.87–5.11)
RDW: 12.9 % (ref 11.5–15.5)
WBC: 8.3 10*3/uL (ref 4.0–10.5)
nRBC: 0 % (ref 0.0–0.2)

## 2022-09-08 LAB — COMPREHENSIVE METABOLIC PANEL
ALT: 15 U/L (ref 0–44)
AST: 19 U/L (ref 15–41)
Albumin: 2.9 g/dL — ABNORMAL LOW (ref 3.5–5.0)
Alkaline Phosphatase: 42 U/L (ref 38–126)
Anion gap: 6 (ref 5–15)
BUN: <5 mg/dL — ABNORMAL LOW (ref 8–23)
CO2: 34 mmol/L — ABNORMAL HIGH (ref 22–32)
Calcium: 8.6 mg/dL — ABNORMAL LOW (ref 8.9–10.3)
Chloride: 94 mmol/L — ABNORMAL LOW (ref 98–111)
Creatinine, Ser: 0.59 mg/dL (ref 0.44–1.00)
GFR, Estimated: >60 mL/min (ref >60)
Glucose, Bld: 102 mg/dL — ABNORMAL HIGH (ref 70–99)
Potassium: 3.2 mmol/L — ABNORMAL LOW (ref 3.5–5.1)
Sodium: 134 mmol/L — ABNORMAL LOW (ref 135–145)
Total Bilirubin: 0.4 mg/dL (ref 0.3–1.2)
Total Protein: 5.6 g/dL — ABNORMAL LOW (ref 6.5–8.1)

## 2022-09-08 LAB — LIPID PANEL
Cholesterol: 109 mg/dL (ref 0–200)
HDL: 44 mg/dL (ref >40)
LDL Cholesterol: 55 mg/dL (ref 0–99)
Total CHOL/HDL Ratio: 2.5 RATIO
Triglycerides: 49 mg/dL (ref <150)
VLDL: 10 mg/dL (ref 0–40)

## 2022-09-08 LAB — PHOSPHORUS
Phosphorus: 3.6 mg/dL (ref 2.5–4.6)
Phosphorus: 3.5 mg/dL (ref 2.5–4.6)

## 2022-09-08 LAB — MAGNESIUM
Magnesium: 1.9 mg/dL (ref 1.7–2.4)
Magnesium: 1.8 mg/dL (ref 1.7–2.4)

## 2022-09-08 LAB — LDL CHOLESTEROL, DIRECT: Direct LDL: 54 mg/dL (ref 0–99)

## 2022-09-08 MED ORDER — ACETAMINOPHEN 325 MG PO TABS
650.0000 mg | ORAL_TABLET | Freq: Four times a day (QID) | ORAL | Status: DC | PRN
  Administered 2022-09-08 – 2022-09-09 (×2): 650 mg via ORAL
  Filled 2022-09-08 (×2): qty 2

## 2022-09-08 MED ORDER — SPIRONOLACTONE 12.5 MG HALF TABLET
12.5000 mg | ORAL_TABLET | Freq: Every day | ORAL | Status: DC
  Administered 2022-09-09 – 2022-09-15 (×7): 12.5 mg via ORAL
  Filled 2022-09-08 (×7): qty 1

## 2022-09-08 MED ORDER — METOPROLOL TARTRATE 25 MG PO TABS
25.0000 mg | ORAL_TABLET | Freq: Two times a day (BID) | ORAL | Status: DC
  Administered 2022-09-09 (×2): 25 mg via ORAL
  Filled 2022-09-08 (×3): qty 1

## 2022-09-08 MED ORDER — SALINE SPRAY 0.65 % NA SOLN
1.0000 | NASAL | Status: DC | PRN
  Filled 2022-09-08: qty 44

## 2022-09-08 NOTE — Evaluation (Signed)
Occupational Therapy Evaluation Patient Details Name: Carol [name] MRN: [id number] DOB: [birth year] Today's Date: 09/08/2022   History of Present Illness 86 year old female who presents after being found unresponsive at home overnight in morning of 11/12. Daughter performed CPR briefly. Unclear if she truly lost pulses. She did have spontaneous circulation when EMS arrived. Pt with pna in April of this year and since then has grown progressively weak, has struggled with protein calorie malnutrition, and has about a 20 pound unintentional weight loss. PMH of COPD, HTN, hyponatremia, and recent admission for pneumonia in April of this year, Anxiety, Arthritis, Colitis, Complication of anesthesia, GERD, Headache, Hypertension, and Urinary tract infection (early aug 2014).   Clinical Impression   Patient admitted for the diagnosis above.  PTA the patient lived with one of her daughter, and had assist as needed from family.  She is currently needing up to Mod A for lower body ADL and Min A for basic mobility on supplemental O2.  Patient appears to be sundowning currently, very confused, not believing family that her hospital room is correct.  RN notified, and family with her.  Patient left sitting in chair with all needs in reach.  OT will follow in the acute setting and SNF is recommended for post acute rehab prior to returning home.  Patient currently does not have adequate assist at home to transition directly there.        Recommendations for follow up therapy are one component of a multi-disciplinary discharge planning process, led by the attending physician.  Recommendations may be updated based on patient status, additional functional criteria and insurance authorization.   Follow Up Recommendations  Skilled nursing-short term rehab (<3 hours/day)    Assistance Recommended at Discharge Frequent or constant Supervision/Assistance  Patient can return home with the following Assist for  transportation;Help with stairs or ramp for entrance;Direct supervision/assist for medications management;A little help with walking and/or transfers;A little help with bathing/dressing/bathroom;Assistance with cooking/housework;Direct supervision/assist for financial management    Functional Status Assessment  Patient has had a recent decline in their functional status and demonstrates the ability to make significant improvements in function in a reasonable and predictable amount of time.  Equipment Recommendations  None recommended by OT    Recommendations for Other Services       Precautions / Restrictions Precautions Precautions: Fall Precaution Comments: Watch O2 and HR Restrictions Weight Bearing Restrictions: No      Mobility Bed Mobility Overal bed mobility: Needs Assistance Bed Mobility: Supine to Sit, Sit to Sidelying     Supine to sit: Supervision   Sit to sidelying: Min assist   Patient Response: Cooperative  Transfers Overall transfer level: Needs assistance Equipment used: Rolling walker (2 wheels) Transfers: Sit to/from Stand Sit to Stand: Min guard                  Balance Overall balance assessment: Needs assistance Sitting-balance support: No upper extremity supported, Feet supported Sitting balance-Leahy Scale: Fair     Standing balance support: Bilateral upper extremity supported, During functional activity Standing balance-Leahy Scale: Poor                             ADL either performed or assessed with clinical judgement   ADL Overall ADL's : Needs assistance/impaired Eating/Feeding: Set up;Bed level   Grooming: Wash/dry hands;Wash/dry face;Set up;Sitting   Upper Body Bathing: Min guard;Sitting   Lower Body Bathing: Moderate assistance;Sit to/from  stand   Upper Body Dressing : Minimal assistance;Sitting   Lower Body Dressing: Moderate assistance;Sit to/from stand   Toilet Transfer: Minimal assistance;Rolling  walker (2 wheels);BSC/3in1                   Vision Baseline Vision/History: 1 Wears glasses Patient Visual Report: No change from baseline       Perception Perception Perception: Not tested   Praxis Praxis Praxis: Not tested    Pertinent Vitals/Pain Pain Assessment Pain Assessment: No/denies pain Pain Intervention(s): Monitored during session     Hand Dominance Right   Extremity/Trunk Assessment Upper Extremity Assessment Upper Extremity Assessment: Generalized weakness   Lower Extremity Assessment Lower Extremity Assessment: Defer to PT evaluation   Cervical / Trunk Assessment Cervical / Trunk Assessment: Kyphotic   Communication Communication Communication: No difficulties   Cognition Arousal/Alertness: Awake/alert Behavior During Therapy: Restless, Agitated Overall Cognitive Status: Impaired/Different from baseline Area of Impairment: Orientation, Following commands, Safety/judgement, Awareness, Problem solving, Attention                 Orientation Level: Person Current Attention Level: Selective   Following Commands: Follows one step commands consistently Safety/Judgement: Decreased awareness of deficits, Decreased awareness of safety Awareness: Intellectual Problem Solving: Requires verbal cues, Requires tactile cues General Comments: Patient with confusion and forgetfulness.  Patient stating possibly sundowning currenlty.     General Comments   HR to 103 with mobility    Exercises     Shoulder Instructions      Home Living Family/patient expects to be discharged to:: Private residence Living Arrangements: Alone Available Help at Discharge: Family;Other (Comment) Type of Home: House Home Access: Stairs to enter [employer] of Steps: 3 Entrance Stairs-Rails: None Home Layout: One level     Bathroom Shower/Tub: Teacher, early years/pre: Handicapped height     Home Equipment: Conservation officer, nature (2  wheels);BSC/3in1;Grab bars - tub/shower          Prior Functioning/Environment Prior Level of Function : Independent/Modified Independent             Mobility Comments: walks without an assistive device typically ADLs Comments: family provides supervision and assist as needed for lower body ADL at home.  Assist with meds and community mobility        OT Problem List: Decreased activity tolerance;Impaired balance (sitting and/or standing);Decreased safety awareness;Decreased cognition;Decreased strength      OT Treatment/Interventions: Self-care/ADL training;Therapeutic exercise;Patient/family education;Balance training;Therapeutic activities;DME and/or AE instruction;Cognitive remediation/compensation    OT Goals(Current goals can be found in the care plan section) Acute Rehab OT Goals Patient Stated Goal: Return home OT Goal Formulation: With patient Time For Goal Achievement: 09/21/22 Potential to Achieve Goals: Fair ADL Goals Pt Will Perform Grooming: with supervision;standing Pt Will Perform Lower Body Dressing: with supervision;sit to/from stand Pt Will Transfer to Toilet: with supervision;ambulating;regular height toilet  OT Frequency: Min 2X/week    Co-evaluation              AM-PAC OT "6 Clicks" Daily Activity     Outcome Measure Help from another person eating meals?: None Help from another person taking care of personal grooming?: A Little Help from another person toileting, which includes using toliet, bedpan, or urinal?: A Little Help from another person bathing (including washing, rinsing, drying)?: A Lot Help from another person to put on and taking off regular upper body clothing?: A Little Help from another person to put on and taking off regular lower body clothing?: A  Lot 6 Click Score: 17   End of Session Equipment Utilized During Treatment: Gait belt;Rolling walker (2 wheels);Oxygen Nurse Communication: Mobility status  Activity Tolerance:  Patient tolerated treatment well Patient left: in chair;with call bell/phone within reach;with family/visitor present  OT Visit Diagnosis: Unsteadiness on feet (R26.81);Muscle weakness (generalized) (M62.81);Other symptoms and signs involving cognitive function                Time: 0223-3612 OT Time Calculation (min): 21 min Charges:  OT General Charges $OT Visit: 1 Visit OT Evaluation $OT Eval Moderate Complexity: 1 Mod  09/08/2022  RP, OTR/L  Acute Rehabilitation Services  Office:  [phone]   Metta Clines 09/08/2022, 3:58 PM

## 2022-09-08 NOTE — Progress Notes (Signed)
Daily Progress Note   Patient Name: Carol [name]       Date: 09/08/2022 DOB: 03/18/1936  Age: 86 y.o. MRN#: [id number] Attending Physician: Jonetta Osgood, MD Primary Care Physician: Shirline Frees, MD Admit Date: 09/06/2022  Reason for Consultation/Follow-up: Establishing goals of care  Subjective: Medical records reviewed including progress notes, labs, imaging. Patient assessed at the bedside, sitting up on the edge of the bed eating her lunch. Her daughter Carol [name] and granddaughter Carol [name] are present visiting.  Created space and opportunity for patient and family's thoughts and feelings on her current illness.  Everyone is quite relieved to see how much better she is doing.  Family shares that they would like to meet at 72 tomorrow afternoon to have a more in-depth goals of care conversation, with 3 of patient's adult children and her granddaughter present.  Met separately with patient's daughter Carol [name] to discuss her concerns that patient may have overdosed on benzodiazepines in the care of patient's other daughter Carol [name].  She explains that patient does not have any access to her own medications.  Family has come to an agreement that Carol [name] is not in a healthy place to be able to serve as a caregiver for the patient.  Given this, patient is now unable to provide 24/7 care in the home.  Carol [name] hopes that they will be able to find SNF placement and allow patient more time to focus on strengthening until family can find better arrangements to keep her at home.  A MOST form was introduced with plans to discuss again tomorrow.  Questions and concerns addressed. PMT will continue to support holistically.   Length of Stay: 2  Physical Exam Vitals and nursing note reviewed.   Constitutional:      General: She is not in acute distress.    Interventions: Nasal cannula in place.  Cardiovascular:     Rate and Rhythm: Normal rate.  Pulmonary:     Effort: Pulmonary effort is normal.  Skin:    General: Skin is warm and dry.  Neurological:     Mental Status: She is alert.  Psychiatric:        Mood and Affect: Mood normal.        Behavior: Behavior normal. Behavior is cooperative.  Vital Signs: BP (!) 151/75   Pulse 71   Temp 98 F (36.7 C) (Oral)   Resp (!) 24   Ht 5' (1.524 m)   Wt 39.3 kg   SpO2 95%   BMI 16.92 kg/m  SpO2: SpO2: 95 % O2 Device: O2 Device: Nasal Cannula O2 Flow Rate: O2 Flow Rate (L/min): 4 L/min      Palliative Assessment/Data: 40 to 50%   Palliative Care Assessment & Plan   Patient Profile: 86 y.o. female  with past medical history of COPD, depression/anxiety, hyponatremia, HTN admitted on 09/06/2022 with unresponsiveness.    Patient admitted with shock from hypovolemia vs medication from benzodiazepines and acute on chronic hypoxemic respiratory failure. PMT has been consulted to assist with goals of care conversation.  Assessment: Goals of care conversation New diagnosis of HFrEF Acute toxic metabolic encephalopathy, improved Syncopal episode AKI, resolved COPD Failure to thrive/debility and deconditioning  Recommendations/Plan: Continue DNR Continue full scope treatment Family meeting scheduled for Sunday 10/15 at 12:30 PM Family is unable to provide 24/7 care and are hopeful for SNF placement after this admission Psychosocial and emotional support provided PMT will continue to follow and support   Prognosis: Guarded to poor, failure to thrive with chronic debility and several chronic comorbidities  Discharge Planning: To Be Determined  Care plan was discussed with patient, patient's daughter, patient's granddaughter, RN    MDM high  Juda, PA-C  Palliative Medicine Team Team  phone # [phone]  Thank you for allowing the Palliative Medicine Team to assist in the care of this patient. Please utilize secure chat with additional questions, if there is no response within 30 minutes please call the above phone number.  Palliative Medicine Team providers are available by phone from 7am to 7pm daily and can be reached through the team cell phone.  Should this patient require assistance outside of these hours, please call the patient's attending physician.

## 2022-09-08 NOTE — Progress Notes (Signed)
VASCULAR LAB    Bilateral lower extremity venous duplex has been performed.  See CV proc for preliminary results.   Selicia Windom, RVT 09/08/2022, 6:29 PM

## 2022-09-08 NOTE — Progress Notes (Signed)
PROGRESS NOTE        PATIENT DETAILS Name: Carol [name] Age: 86 y.o. Sex: female Date of Birth: 26-Aug-1936 Admit Date: 09/06/2022 Admitting Physician Collier Bullock, MD GUY:[id number], Gwyndolyn Saxon, MD  Brief Summary: Patient is a 86 y.o.  female with history of COPD-on home O2 prn, HTN-who presented with a episode of unresponsiveness-family apparently did CPR at home, when EMS arrived she had already achieved spontaneous circulation.  She was hypotensive when she arrived in the ED, she was started on Levophed and subsequently admitted to the ICU by PCCM-upon further evaluation-she was found to have new diagnosis of HFrEF.  Significant events: 10/12>> episode of unresponsiveness-syncope at home-hypotensive in the ED-admit to ICU. 10/14>> [facility] to [facility]  Significant studies: 10/12>> CXR: No PNA 10/12>> CT head: No acute intracranial abnormalities. 10/12>> Echo: EF 20-25%  Significant microbiology data: 10/12>> COVID PCR: Negative  Procedures: None  Consults: PCCM Cardiology Palliative  Subjective: Lying comfortably in bed-denies any chest pain or shortness of breath.  Objective: Vitals: Blood pressure 129/73, pulse (!) 102, temperature 98 F (36.7 C), temperature source Oral, resp. rate (!) 34, height 5' (1.524 m), weight 39.3 kg, SpO2 94 %.   Exam: Gen Exam:Alert awake-not in any distress HEENT:atraumatic, normocephalic Chest: B/L clear to auscultation anteriorly CVS:S1S2 regular Abdomen:soft non tender, non distended Extremities:no edema Neurology: Non focal Skin: no rash  Pertinent Labs/Radiology:    Latest Ref Rng & Units 09/08/2022    6:19 AM 09/07/2022    1:43 AM 09/06/2022    1:04 AM  CBC  WBC 4.0 - 10.5 K/uL 8.3  7.5  8.7   Hemoglobin 12.0 - 15.0 g/dL 12.5  11.3  11.1   Hematocrit 36.0 - 46.0 % 37.4  33.4  32.3   Platelets 150 - 400 K/uL 210  173  193     Lab Results  Component Value Date   NA 134 (L) 09/08/2022   K  3.2 (L) 09/08/2022   CL 94 (L) 09/08/2022   CO2 34 (H) 09/08/2022      Assessment/Plan: Syncopal episode Unclear exactly what happened at home-when she was unresponsive-suspect may have been a syncopal episode. Echo as above Continue telemetry monitoring  Acute toxic metabolic encephalopathy Multifactorial from Ativan use and from hypoperfusion in the setting of hypotension/cardiogenic shock Improved-awake/alert this morning. Continue supportive care  Multifactorial shock-hypovolemia/side effect of medications/cardiogenic BP now stable  New diagnosis of HFrEF Euvolemic On IV Lasix, Corlanor, Farxiga Cardiology following Per cardiology note-family prefers gentle medical treatment and elects to defer invasive studies  Minimally elevated troponin Demand ischemia in the setting of hypotension No anginal symptoms Family prefers gentle medical treatment-no invasive studies  AKI Hemodynamically mediated Resolved  Hypokalemia Replete and recheck  HTN BP stable Prior antihypertensive discontinued-cardiology titrating GDMT for new diagnosis of HFrEF  HLD Continue statin  COPD Chronic hypoxic respiratory failure on as needed home O2 Stable-not on exacerbation Continue bronchodilators  Dysphagia Some concern for silent aspiration Appreciate SLP eval-on dysphagia 2 diet  Weight loss/failure to thrive syndrome Recent TSH in June 2023 stable Suspect she may have frailty/COPD related cachexia/failure to thrive syndrome  Tobacco abuse Counseled  Debility/deconditioning Due to acute illness PT/OT eval Home health services recommended  Nutrition Status: Nutrition Problem: Severe Malnutrition Etiology: chronic illness (dysphagia, COPD) Signs/Symptoms: severe muscle depletion, severe fat depletion, percent weight loss (21.8% weight loss in  6 months) Percent weight loss: 21.8 % Interventions: Ensure Enlive (each supplement provides 350kcal and 20 grams of protein),  MVI, Magic cup, Education  Underweight: Estimated body mass index is 16.92 kg/m as calculated from the following:   Height as of this encounter: 5' (1.524 m).   Weight as of this encounter: 39.3 kg.   Code status:   Code Status: DNR   DVT Prophylaxis: enoxaparin (LOVENOX) injection 30 mg Start: 09/07/22 1215 SCDs Start: 09/06/22 0618   Family Communication: None at bedside   Disposition Plan: Status is: Inpatient Remains inpatient appropriate because: New diagnosis of HFrEF-ongoing titration of medications-not yet stable for discharge.   Planned Discharge Destination:Home health.   Diet: Diet Order             DIET DYS 2 Room service appropriate? Yes; Fluid consistency: Thin  Diet effective now                     Antimicrobial agents: Anti-infectives (From admission, onward)    None        MEDICATIONS: Scheduled Meds:  aspirin  81 mg Oral Daily   busPIRone  5 mg Oral BID   Chlorhexidine Gluconate Cloth  6 each Topical Daily   dapagliflozin propanediol  10 mg Oral Daily   enoxaparin (LOVENOX) injection  30 mg Subcutaneous Q24H   feeding supplement  237 mL Oral BID BM   fluticasone furoate-vilanterol  1 puff Inhalation Daily   And   umeclidinium bromide  1 puff Inhalation Daily   furosemide  20 mg Intravenous Daily   ivabradine  5 mg Oral BID WC   LORazepam  0.5 mg Oral TID   mirtazapine  7.5 mg Oral QHS   multivitamin with minerals  1 tablet Oral Daily   mupirocin ointment  1 Application Nasal BID   nicotine  21 mg Transdermal Daily   mouth rinse  15 mL Mouth Rinse 4 times per day   pantoprazole (PROTONIX) IV  40 mg Intravenous Q24H   potassium chloride  40 mEq Oral BID   raloxifene  60 mg Oral Daily   simvastatin  20 mg Oral QHS   venlafaxine XR  150 mg Oral Daily   Continuous Infusions:  sodium chloride 20 mL/hr at 09/06/22 0938   dexmedetomidine (PRECEDEX) IV infusion Stopped (09/07/22 0346)   dextrose 40 mL/hr at 09/08/22 0800   PRN  Meds:.haloperidol lactate, ipratropium-albuterol, mouth rinse   I have personally reviewed following labs and imaging studies  LABORATORY DATA: CBC: Recent Labs  Lab 09/06/22 0104 09/07/22 0143 09/08/22 0619  WBC 8.7 7.5 8.3  NEUTROABS 6.2  --  6.0  HGB 11.1* 11.3* 12.5  HCT 32.3* 33.4* 37.4  MCV 100.9* 101.5* 100.8*  PLT 193 173 267    Basic Metabolic Panel: Recent Labs  Lab 09/06/22 0104 09/07/22 0143 09/07/22 1652 09/08/22 0619  NA 133* 140  --  134*  K 3.3* 3.9  --  3.2*  CL 94* 101  --  94*  CO2 27 31  --  34*  GLUCOSE 178* 95  --  102*  BUN 10 8  --  <5*  CREATININE 1.04* 0.61  --  0.59  CALCIUM 9.0 8.7*  --  8.6*  MG  --  1.4* 2.2 1.9  PHOS  --  4.2 3.8 3.6    GFR: Estimated Creatinine Clearance: 33.1 mL/min (by C-G formula based on SCr of 0.59 mg/dL).  Liver Function Tests: Recent Labs  Lab 09/06/22  0104 09/08/22 0619  AST 14* 19  ALT 14 15  ALKPHOS 34* 42  BILITOT 0.7 0.4  PROT 5.0* 5.6*  ALBUMIN 3.0* 2.9*   No results for input(s): "LIPASE", "AMYLASE" in the last 168 hours. Recent Labs  Lab 09/06/22 0822  AMMONIA <10    Coagulation Profile: Recent Labs  Lab 09/06/22 0822  INR 1.2    Cardiac Enzymes: No results for input(s): "CKTOTAL", "CKMB", "CKMBINDEX", "TROPONINI" in the last 168 hours.  BNP (last 3 results) No results for input(s): "PROBNP" in the last 8760 hours.  Lipid Profile: Recent Labs    09/08/22 0619  CHOL 109  HDL 44  LDLCALC 55  TRIG 49  CHOLHDL 2.5  LDLDIRECT 54    Thyroid Function Tests: No results for input(s): "TSH", "T4TOTAL", "FREET4", "T3FREE", "THYROIDAB" in the last 72 hours.  Anemia Panel: No results for input(s): "VITAMINB12", "FOLATE", "FERRITIN", "TIBC", "IRON", "RETICCTPCT" in the last 72 hours.  Urine analysis:    Component Value Date/Time   COLORURINE YELLOW 09/06/2022 0248   APPEARANCEUR HAZY (A) 09/06/2022 0248   LABSPEC 1.009 09/06/2022 0248   PHURINE 7.0 09/06/2022 0248    GLUCOSEU NEGATIVE 09/06/2022 0248   HGBUR NEGATIVE 09/06/2022 0248   BILIRUBINUR NEGATIVE 09/06/2022 0248   BILIRUBINUR neg 07/05/2013 1027   KETONESUR NEGATIVE 09/06/2022 0248   PROTEINUR 30 (A) 09/06/2022 0248   UROBILINOGEN 0.2 09/03/2015 1220   NITRITE NEGATIVE 09/06/2022 0248   LEUKOCYTESUR NEGATIVE 09/06/2022 0248    Sepsis Labs: Lactic Acid, Venous    Component Value Date/Time   LATICACIDVEN 1.6 09/06/2022 0314    MICROBIOLOGY: Recent Results (from the past 240 hour(s))  SARS Coronavirus 2 by RT PCR (hospital order, performed in [facility] hospital lab) *cepheid single result test* Anterior Nasal Swab     Status: None   Collection Time: 09/06/22  7:28 AM   Specimen: Anterior Nasal Swab  Result Value Ref Range Status   SARS Coronavirus 2 by RT PCR NEGATIVE NEGATIVE Final    Comment: (NOTE) SARS-CoV-2 target nucleic acids are NOT DETECTED.  The SARS-CoV-2 RNA is generally detectable in upper and lower respiratory specimens during the acute phase of infection. The lowest concentration of SARS-CoV-2 viral copies this assay can detect is 250 copies / mL. A negative result does not preclude SARS-CoV-2 infection and should not be used as the sole basis for treatment or other patient management decisions.  A negative result may occur with improper specimen collection / handling, submission of specimen other than nasopharyngeal swab, presence of viral mutation(s) within the areas targeted by this assay, and inadequate number of viral copies (<250 copies / mL). A negative result must be combined with clinical observations, patient history, and epidemiological information.  Fact Sheet for Patients:   https://www.patel.info/  Fact Sheet for Healthcare Providers: https://hall.com/  This test is not yet approved or  cleared by the [place] FDA and has been authorized for detection and/or diagnosis of SARS-CoV-2 by FDA under an  Emergency Use Authorization (EUA).  This EUA will remain in effect (meaning this test can be used) for the duration of the COVID-19 declaration under Section 564(b)(1) of the Act, 21 U.S.C. section 360bbb-3(b)(1), unless the authorization is terminated or revoked sooner.  Performed at [facility], [place] [address], [place], [place] [address]   MRSA Next Gen by PCR, Nasal     Status: Abnormal   Collection Time: 09/06/22  5:40 PM   Specimen: Nasal Mucosa; Nasal Swab  Result Value  Ref Range Status   MRSA by PCR Next Gen DETECTED (A) NOT DETECTED Final    Comment: RESULT CALLED TO, READ BACK BY AND VERIFIED WITH: RN [place] [phone] '@2016'$  FH (NOTE) The GeneXpert MRSA Assay (FDA approved for NASAL specimens only), is one component of a comprehensive MRSA colonization surveillance program. It is not intended to diagnose MRSA infection nor to guide or monitor treatment for MRSA infections. Test performance is not FDA approved in patients less than 63 years old. Performed at [facility], [place] [address] [place], [place] [address]     RADIOLOGY STUDIES/RESULTS: ECHOCARDIOGRAM COMPLETE  Result Date: 09/06/2022    ECHOCARDIOGRAM REPORT   Patient Name:   Carol A Erbes Date of Exam: 09/06/2022 Medical Rec #:  [id number]         Height:       60.0 in Accession #:    [id number]        Weight:       82.2 lb Date of Birth:  1936-03-03        BSA:          1.279 m Patient Age:    68 years          BP:           102/49 mmHg Patient Gender: F                 HR:           113 bpm. Exam Location:  Inpatient Procedure: 2D Echo, Cardiac Doppler and Color Doppler Indications:    Shock ([place]) [[id number]]  History:        Patient has no prior history of Echocardiogram examinations.                 COPD; Risk Factors:Hypertension and Current Smoker.  Sonographer:    Ronny Flurry Referring Phys: [id number]  1. Left ventricular ejection fraction, by estimation, is 20 to 25%.  The left ventricle has severely decreased function. The left ventricle demonstrates global hypokinesis. There is mild left ventricular hypertrophy. Left ventricular diastolic parameters  are indeterminate.  2. Right ventricular systolic function is normal. The right ventricular size is normal.  3. Left atrial size was mildly dilated.  4. There is no evidence of cardiac tamponade.  5. The mitral valve is normal in structure. Trivial mitral valve regurgitation. No evidence of mitral stenosis.  6. The aortic valve is normal in structure. Aortic valve regurgitation is not visualized. No aortic stenosis is present. FINDINGS  Left Ventricle: Left ventricular ejection fraction, by estimation, is 20 to 25%. The left ventricle has severely decreased function. The left ventricle demonstrates global hypokinesis. The left ventricular internal cavity size was normal in size. There is mild left ventricular hypertrophy. Left ventricular diastolic parameters are indeterminate. Right Ventricle: The right ventricular size is normal. No increase in right ventricular wall thickness. Right ventricular systolic function is normal. Left Atrium: Left atrial size was mildly dilated. Right Atrium: Right atrial size was normal in size. Pericardium: Trivial pericardial effusion is present. There is no evidence of cardiac tamponade. Mitral Valve: The mitral valve is normal in structure. Trivial mitral valve regurgitation. No evidence of mitral valve stenosis. Tricuspid Valve: The tricuspid valve is normal in structure. Tricuspid valve regurgitation is mild . No evidence of tricuspid stenosis. Aortic Valve: The aortic valve is normal in structure. Aortic valve regurgitation is not visualized. No aortic stenosis is present. Aortic valve mean gradient  measures 4.5 mmHg. Aortic valve peak gradient measures 8.3 mmHg. Aortic valve area, by VTI measures 2.02 cm. Pulmonic Valve: The pulmonic valve was normal in structure. Pulmonic valve regurgitation  is not visualized. No evidence of pulmonic stenosis. Aorta: The aortic root is normal in size and structure. Venous: The inferior vena cava was not well visualized. IAS/Shunts: No atrial level shunt detected by color flow Doppler.  LEFT VENTRICLE PLAX 2D LVIDd:         4.70 cm LV PW:         0.90 cm LV IVS:        1.10 cm LVOT diam:     1.90 cm LV SV:         42 LV SV Index:   33 LVOT Area:     2.84 cm  RIGHT VENTRICLE             IVC RV S prime:     17.70 cm/s  IVC diam: 1.60 cm TAPSE (M-mode): 1.8 cm LEFT ATRIUM             Index        RIGHT ATRIUM          Index LA diam:        3.40 cm 2.66 cm/m   RA Area:     8.19 cm LA Vol (A2C):   44.7 ml 34.94 ml/m  RA Volume:   16.30 ml 12.74 ml/m LA Vol (A4C):   23.7 ml 18.52 ml/m LA Biplane Vol: 35.0 ml 27.36 ml/m  AORTIC VALVE AV Area (Vmax):    2.33 cm AV Area (Vmean):   2.26 cm AV Area (VTI):     2.02 cm AV Vmax:           144.00 cm/s AV Vmean:          101.200 cm/s AV VTI:            0.208 m AV Peak Grad:      8.3 mmHg AV Mean Grad:      4.5 mmHg LVOT Vmax:         118.50 cm/s LVOT Vmean:        80.500 cm/s LVOT VTI:          0.148 m LVOT/AV VTI ratio: 0.71  AORTA Ao Root diam: 3.50 cm MV E velocity: 108.00 cm/s  TRICUSPID VALVE                             TR Peak grad:   20.1 mmHg                             TR Vmax:        224.00 cm/s                              SHUNTS                             Systemic VTI:  0.15 m                             Systemic Diam: 1.90 cm Skeet Latch MD Electronically signed by Skeet Latch MD Signature Date/Time: 09/06/2022/4:25:33 PM    Final  LOS: 2 days   Oren Binet, MD  [facility]    To contact the attending provider between 7A-7P or the covering provider during after hours 7P-7A, please log into the web site [email or web address] and access using universal [place] password for that web site. If you do not have the password, please call the hospital operator.  09/08/2022, 9:18 AM

## 2022-09-08 NOTE — Progress Notes (Signed)
Progress Note  Patient Name: Carol [name] Date of Encounter: 09/08/2022  Attending physician: Jonetta Osgood, MD Primary care provider: Shirline Frees, MD  Subjective: Carol A Baumgarten is a 86 y.o. female who was seen and examined at bedside  Denies chest pain or shortness of breath.   Accompanied by her granddaughter Loma Sousa and daughter-in-law. I was informed that the family had a discussion yesterday and the shared decision was to continue conservative management.  Palliative care has seen the patient and plans to have a family meeting tomorrow   Objective: Vital Signs in the last 24 hours: Temp:  [98 F (36.7 C)-100.9 F (38.3 C)] 98 F (36.7 C) (10/14 0759) Pulse Rate:  [71-174] 79 (10/14 1700) Resp:  [22-42] 32 (10/14 1700) BP: (101-151)/(48-130) 114/60 (10/14 1700) SpO2:  [82 %-100 %] 100 % (10/14 1700) Weight:  [39.3 kg] 39.3 kg (10/14 0436)  Intake/Output:  Intake/Output Summary (Last 24 hours) at 09/08/2022 1756 Last data filed at 09/08/2022 1100 Gross per 24 hour  Intake 1379.49 ml  Output 1325 ml  Net 54.49 ml    Net IO Since Admission: 740.54 mL [09/08/22 1756]  Weights:  Filed Weights   09/06/22 0058 09/08/22 0436  Weight: 37.3 kg 39.3 kg    Telemetry: Personally reviewed.  Sinus rhythm without significant ectopy  Physical examination: PHYSICAL EXAM:    09/08/2022    5:00 PM 09/08/2022    4:04 PM 09/08/2022    3:00 PM  Vitals with BMI  Systolic 176 160 737  Diastolic 60 48 90  Pulse 79 90 168    General: Appears older than stated age, cachectic, hemodynamically stable, no acute distress HEENT: Dry mucous membranes, no JVP, trachea midline, no carotid bruits Lungs: Clear to auscultation bilaterally.  No wheezes rales or rhonchi's. Heart: Tachycardic, regular, positive S1-S2, no murmurs rubs or gallops appreciated secondary to tachycardia Abdomen: Cachectic, soft, nontender, nondistended, positive bowel sounds in all 4  quadrants Extremities: No pitting edema, 2+ DP and PT pulses, warm to touch Neuro: Alert to person, place, time, and situation.  Nonfocal. Psych: Normal mood and affect normal behavior.  Cooperative  Lab Results: Hematology Recent Labs  Lab 09/06/22 0104 09/07/22 0143 09/08/22 0619  WBC 8.7 7.5 8.3  RBC 3.20* 3.29* 3.71*  HGB 11.1* 11.3* 12.5  HCT 32.3* 33.4* 37.4  MCV 100.9* 101.5* 100.8*  MCH 34.7* 34.3* 33.7  MCHC 34.4 33.8 33.4  RDW 12.5 12.9 12.9  PLT 193 173 210    Chemistry Recent Labs  Lab 09/06/22 0104 09/07/22 0143 09/08/22 0619  NA 133* 140 134*  K 3.3* 3.9 3.2*  CL 94* 101 94*  CO2 27 31 34*  GLUCOSE 178* 95 102*  BUN 10 8 <5*  CREATININE 1.04* 0.61 0.59  CALCIUM 9.0 8.7* 8.6*  PROT 5.0*  --  5.6*  ALBUMIN 3.0*  --  2.9*  AST 14*  --  19  ALT 14  --  15  ALKPHOS 34*  --  42  BILITOT 0.7  --  0.4  GFRNONAA 53* >60 >60  ANIONGAP '12 8 6     '$ Cardiac Enzymes: Cardiac Panel (last 3 results) Recent Labs    09/06/22 0104 09/06/22 0314  TROPONINIHS 20* 29*    BNP (last 3 results) Recent Labs    03/16/22 1810  BNP 147.9*    ProBNP (last 3 results) No results for input(s): "PROBNP" in the last 8760 hours.   DDimer  Recent Labs  Lab 09/07/22 1045  DDIMER 1.28*     Hemoglobin A1c: No results found for: "HGBA1C", "MPG"  TSH  Recent Labs    03/16/22 2155 05/13/22 1859  TSH 1.194 1.292    Lipid Panel     Component Value Date/Time   CHOL 109 09/08/2022 0619   TRIG 49 09/08/2022 0619   HDL 44 09/08/2022 0619   CHOLHDL 2.5 09/08/2022 0619   VLDL 10 09/08/2022 0619   LDLCALC 55 09/08/2022 0619   LDLDIRECT 54 09/08/2022 0619    Imaging: No results found.  CARDIAC DATABASE: EKG: 09/07/2022: Sinus rhythm, left bundle branch block, 76 bpm, without underlying injury pattern.   Echocardiogram: 09/06/2022: LVEF 20-25%, global hypokinesis, mild LVH, mild LAE, trivial MR, trivial pericardial effusion. ~Personally reviewed the  echocardiogram which notes severely reduced LVEF with regional wall motion normalities.    Scheduled Meds:  aspirin  81 mg Oral Daily   busPIRone  5 mg Oral BID   Chlorhexidine Gluconate Cloth  6 each Topical Daily   dapagliflozin propanediol  10 mg Oral Daily   enoxaparin (LOVENOX) injection  30 mg Subcutaneous Q24H   feeding supplement  237 mL Oral BID BM   fluticasone furoate-vilanterol  1 puff Inhalation Daily   And   umeclidinium bromide  1 puff Inhalation Daily   furosemide  20 mg Intravenous Daily   ivabradine  5 mg Oral BID WC   LORazepam  0.5 mg Oral TID   mirtazapine  7.5 mg Oral QHS   multivitamin with minerals  1 tablet Oral Daily   mupirocin ointment  1 Application Nasal BID   nicotine  21 mg Transdermal Daily   mouth rinse  15 mL Mouth Rinse 4 times per day   pantoprazole (PROTONIX) IV  40 mg Intravenous Q24H   raloxifene  60 mg Oral Daily   simvastatin  20 mg Oral QHS   venlafaxine XR  150 mg Oral Daily    Continuous Infusions:  sodium chloride 20 mL/hr at 09/06/22 0938    PRN Meds: acetaminophen, haloperidol lactate, ipratropium-albuterol, mouth rinse, sodium chloride   IMPRESSION & RECOMMENDATIONS: Carol A Kopf is a 86 y.o. Caucasian female whose past medical history and cardiac risk factors include: Newly discovered cardiomyopathy, COPD, hypertension, hyperlipidemia, current smoker, advanced age  Impression: Newly discovered cardiomyopathy/HFrEF. Elevated troponins likely secondary to supply demand ischemia Aortic atherosclerosis, coronary artery calcification (CT scan chest 06/2022) Acute encephalopathy on presentation - resolved Acute kidney injury on presentation - resolved Hypertension. Hyperlipidemia. Current smoker. COPD/emphysema  Recommendations: Cardiology was consulted during his hospitalization as she had a surface echocardiogram as part of work-up for acute encephalopathy that was present on admission.  Echocardiogram noted severely  reduced LVEF and therefore cardiology was recommended for further evaluation and management.  Spoke to the patient's son Timmothy Sours yesterday and Loma Sousa (granddaughter) and daughter-in-law today.  Patient appears to have a good family support and are quite reasonable given her frailty, advanced age, other comorbid conditions.  She is currently DNR/DNI and family prefers conservative management and to avoid invasive procedures if possible.  This is quite reasonable given the clinical setting.  They have a family meeting arranged with palliative care tomorrow as well.  Patient was started on Corlanor, Lasix, and [place].  Clinically has improved and still has the heart rate variability in blood pressure.  We will start Lopressor 25 mg p.o. twice daily as she is not in florid heart failure and spironolactone 12.5 mg p.o. daily starting tomorrow a.m.  We will continue to uptitrate  GDMT as hemodynamics and laboratory values allow.  We will continue aspirin 81 mg p.o. daily as long as she remains low risk for bleeding given her aortic atherosclerosis/coronary artery calcification.  Continue home dose statin therapy for underlying hyperlipidemia  Reemphasized importance of complete cessation of smoking given the newly discovered cardiomyopathy/HFrEF/recent hospitalization.  Continue care as per primary team.  Patient's questions and concerns were addressed to her satisfaction. She voices understanding of the instructions provided during this encounter.   This note was created using a voice recognition software as a result there may be grammatical errors inadvertently enclosed that do not reflect the nature of this encounter. Every attempt is made to correct such errors.  Total time spent: 35 minutes.  Mechele Claude [facility]  Pager: [phone] Office: [phone] 09/08/2022, 5:56 PM

## 2022-09-09 DIAGNOSIS — F17210 Nicotine dependence, cigarettes, uncomplicated: Secondary | ICD-10-CM | POA: Diagnosis not present

## 2022-09-09 DIAGNOSIS — I10 Essential (primary) hypertension: Secondary | ICD-10-CM | POA: Diagnosis not present

## 2022-09-09 DIAGNOSIS — I429 Cardiomyopathy, unspecified: Secondary | ICD-10-CM | POA: Diagnosis not present

## 2022-09-09 DIAGNOSIS — I2510 Atherosclerotic heart disease of native coronary artery without angina pectoris: Secondary | ICD-10-CM | POA: Diagnosis not present

## 2022-09-09 DIAGNOSIS — E43 Unspecified severe protein-calorie malnutrition: Secondary | ICD-10-CM | POA: Diagnosis not present

## 2022-09-09 DIAGNOSIS — Z7189 Other specified counseling: Secondary | ICD-10-CM | POA: Diagnosis not present

## 2022-09-09 DIAGNOSIS — I5020 Unspecified systolic (congestive) heart failure: Secondary | ICD-10-CM | POA: Diagnosis not present

## 2022-09-09 DIAGNOSIS — I700 Atherosclerosis of aorta: Secondary | ICD-10-CM | POA: Diagnosis not present

## 2022-09-09 DIAGNOSIS — R7989 Other specified abnormal findings of blood chemistry: Secondary | ICD-10-CM | POA: Diagnosis not present

## 2022-09-09 DIAGNOSIS — I110 Hypertensive heart disease with heart failure: Secondary | ICD-10-CM | POA: Insufficient documentation

## 2022-09-09 LAB — BASIC METABOLIC PANEL
Anion gap: 10 (ref 5–15)
BUN: 8 mg/dL (ref 8–23)
CO2: 32 mmol/L (ref 22–32)
Calcium: 9.2 mg/dL (ref 8.9–10.3)
Chloride: 95 mmol/L — ABNORMAL LOW (ref 98–111)
Creatinine, Ser: 0.64 mg/dL (ref 0.44–1.00)
GFR, Estimated: >60 mL/min (ref >60)
Glucose, Bld: 85 mg/dL (ref 70–99)
Potassium: 3.6 mmol/L (ref 3.5–5.1)
Sodium: 137 mmol/L (ref 135–145)

## 2022-09-09 LAB — PHOSPHORUS: Phosphorus: 4.1 mg/dL (ref 2.5–4.6)

## 2022-09-09 LAB — MAGNESIUM: Magnesium: 1.9 mg/dL (ref 1.7–2.4)

## 2022-09-09 MED ORDER — VENLAFAXINE HCL ER 37.5 MG PO CP24: 37.5000 mg | ORAL_CAPSULE | Freq: Every day | ORAL | Status: DC

## 2022-09-09 MED ORDER — VENLAFAXINE HCL ER 75 MG PO CP24
75.0000 mg | ORAL_CAPSULE | Freq: Every day | ORAL | Status: DC
  Administered 2022-09-11 – 2022-09-13 (×3): 75 mg via ORAL
  Filled 2022-09-09 (×3): qty 1

## 2022-09-09 MED ORDER — VENLAFAXINE HCL ER 75 MG PO CP24
150.0000 mg | ORAL_CAPSULE | Freq: Every day | ORAL | Status: AC
  Administered 2022-09-09 – 2022-09-10 (×2): 150 mg via ORAL
  Filled 2022-09-09 (×2): qty 2

## 2022-09-09 MED ORDER — PANTOPRAZOLE SODIUM 40 MG PO TBEC
40.0000 mg | DELAYED_RELEASE_TABLET | Freq: Every day | ORAL | Status: DC
  Administered 2022-09-09 – 2022-09-15 (×7): 40 mg via ORAL
  Filled 2022-09-09 (×7): qty 1

## 2022-09-09 MED ORDER — MIRTAZAPINE 15 MG PO TBDP
7.5000 mg | ORAL_TABLET | Freq: Every day | ORAL | Status: AC
  Administered 2022-09-09 – 2022-09-13 (×5): 7.5 mg via ORAL
  Filled 2022-09-09 (×5): qty 0.5

## 2022-09-09 MED ORDER — MIRTAZAPINE 15 MG PO TABS: 15.0000 mg | ORAL_TABLET | Freq: Every day | ORAL | Status: DC

## 2022-09-09 NOTE — Progress Notes (Signed)
PROGRESS NOTE        PATIENT DETAILS Name: Carol [name] Age: 86 y.o. Sex: female Date of Birth: 12-02-1935 Admit Date: 09/06/2022 Admitting Physician Collier Bullock, MD EGB:[id number], Gwyndolyn Saxon, MD  Brief Summary: Patient is a 86 y.o.  female with history of COPD-on home O2 prn, HTN-who presented with a episode of unresponsiveness-family apparently did CPR at home, when EMS arrived she had already achieved spontaneous circulation.  She was hypotensive when she arrived in the ED, she was started on Levophed and subsequently admitted to the ICU by PCCM-upon further evaluation-she was found to have new diagnosis of HFrEF.  Significant events: 10/12>> episode of unresponsiveness-syncope at home-hypotensive in the ED-admit to ICU. 10/14>> transfer to [facility]  Significant studies: 10/12>> CXR: No PNA 10/12>> CT head: No acute intracranial abnormalities. 10/12>> Echo: EF 20-25% 10/14>> bilateral lower extremity Doppler: Negative  Significant microbiology data: 10/12>> COVID PCR: Negative  Procedures: None  Consults: PCCM Cardiology Palliative  Subjective: Mildly confused this morning-Per nursing staff-had delirium overnight.  Objective: Vitals: Blood pressure (!) 126/59, pulse 93, temperature 98.2 F (36.8 C), resp. rate 17, height 5' (1.524 m), weight 39.3 kg, SpO2 100 %.   Exam: Gen Exam:Alert awake-not in any distress HEENT:atraumatic, normocephalic Chest: B/L clear to auscultation anteriorly CVS:S1S2 regular Abdomen:soft non tender, non distended Extremities:no edema Neurology: Non focal Skin: no rash   Pertinent Labs/Radiology:    Latest Ref Rng & Units 09/08/2022    6:19 AM 09/07/2022    1:43 AM 09/06/2022    1:04 AM  CBC  WBC 4.0 - 10.5 K/uL 8.3  7.5  8.7   Hemoglobin 12.0 - 15.0 g/dL 12.5  11.3  11.1   Hematocrit 36.0 - 46.0 % 37.4  33.4  32.3   Platelets 150 - 400 K/uL 210  173  193     Lab Results  Component Value Date   NA  137 09/09/2022   K 3.6 09/09/2022   CL 95 (L) 09/09/2022   CO2 32 09/09/2022      Assessment/Plan: Syncopal episode Unclear exactly what happened at home-when she was unresponsive-suspect may have been a syncopal episode. Echo as above Continue telemetry monitoring  Acute toxic metabolic encephalopathy Multifactorial from Ativan use and from hypoperfusion in the setting of hypotension/cardiogenic shock Although overall improved-had delirium overnight-pleasantly confused this morning Maintain delirium precautions.  Multifactorial shock-hypovolemia/side effect of medications/cardiogenic Shock physiology resolved-BP stable.  New diagnosis of HFrEF Euvolemic On IV Lasix, Farxiga, Corlanor-beta-blocker/Aldactone being started today.   Cardiology following Per cardiology note-family prefers gentle medical treatment and elects to defer invasive studies  Minimally elevated troponin Demand ischemia in the setting of hypotension No anginal symptoms Family prefers gentle medical treatment-no invasive studies  AKI Hemodynamically mediated Resolved  Hypokalemia Repleted.  HTN BP stable Continue Aldactone/Lasix/metoprolol   HLD Continue statin  COPD Chronic hypoxic respiratory failure on as needed home O2 Stable-not on exacerbation Continue bronchodilators  Dysphagia Some concern for silent aspiration Appreciate SLP eval-on dysphagia 2 diet  Weight loss/failure to thrive syndrome Recent TSH in June 2023 stable Suspect she may have frailty/COPD related cachexia/failure to thrive syndrome  Tobacco abuse Counseled  Debility/deconditioning Due to acute illness PT/OT eval Home health services recommended  Palliative care DNR in place Gentle medical treatment Palliative care following and has scheduled family meeting later today.  Nutrition Status: Nutrition Problem: Severe Malnutrition Etiology:  chronic illness (dysphagia, COPD) Signs/Symptoms: severe muscle  depletion, severe fat depletion, percent weight loss (21.8% weight loss in 6 months) Percent weight loss: 21.8 % Interventions: Ensure Enlive (each supplement provides 350kcal and 20 grams of protein), MVI, Magic cup, Education  Underweight: Estimated body mass index is 16.92 kg/m as calculated from the following:   Height as of this encounter: 5' (1.524 m).   Weight as of this encounter: 39.3 kg.   Code status:   Code Status: DNR   DVT Prophylaxis: enoxaparin (LOVENOX) injection 30 mg Start: 09/07/22 1215 SCDs Start: 09/06/22 0618   Family Communication: None at bedside-palliative care meeting with family later today-will defer calling family.   Disposition Plan: Status is: Inpatient Remains inpatient appropriate because: New diagnosis of HFrEF-ongoing titration of medications-not yet stable for discharge.   Planned Discharge Destination:Home health.   Diet: Diet Order             DIET DYS 2 Room service appropriate? Yes; Fluid consistency: Thin  Diet effective now                     Antimicrobial agents: Anti-infectives (From admission, onward)    None        MEDICATIONS: Scheduled Meds:  aspirin  81 mg Oral Daily   busPIRone  5 mg Oral BID   Chlorhexidine Gluconate Cloth  6 each Topical Daily   dapagliflozin propanediol  10 mg Oral Daily   enoxaparin (LOVENOX) injection  30 mg Subcutaneous Q24H   feeding supplement  237 mL Oral BID BM   fluticasone furoate-vilanterol  1 puff Inhalation Daily   And   umeclidinium bromide  1 puff Inhalation Daily   furosemide  20 mg Intravenous Daily   ivabradine  5 mg Oral BID WC   LORazepam  0.5 mg Oral TID   metoprolol tartrate  25 mg Oral BID   mirtazapine  7.5 mg Oral QHS   Followed by   Derrill Memo ON 09/14/2022] mirtazapine  15 mg Oral QHS   multivitamin with minerals  1 tablet Oral Daily   mupirocin ointment  1 Application Nasal BID   nicotine  21 mg Transdermal Daily   mouth rinse  15 mL Mouth Rinse 4 times  per day   pantoprazole  40 mg Oral Daily   raloxifene  60 mg Oral Daily   simvastatin  20 mg Oral QHS   spironolactone  12.5 mg Oral Daily   venlafaxine XR  150 mg Oral Q breakfast   Followed by   Derrill Memo ON 09/11/2022] venlafaxine XR  75 mg Oral Q breakfast   Followed by   Derrill Memo ON 09/15/2022] venlafaxine XR  37.5 mg Oral Q breakfast   Continuous Infusions:  sodium chloride 20 mL/hr at 09/06/22 0938   PRN Meds:.acetaminophen, haloperidol lactate, ipratropium-albuterol, mouth rinse, sodium chloride   I have personally reviewed following labs and imaging studies  LABORATORY DATA: CBC: Recent Labs  Lab 09/06/22 0104 09/07/22 0143 09/08/22 0619  WBC 8.7 7.5 8.3  NEUTROABS 6.2  --  6.0  HGB 11.1* 11.3* 12.5  HCT 32.3* 33.4* 37.4  MCV 100.9* 101.5* 100.8*  PLT 193 173 210     Basic Metabolic Panel: Recent Labs  Lab 09/06/22 0104 09/07/22 0143 09/07/22 1652 09/08/22 0619 09/08/22 2009 09/09/22 0545  NA 133* 140  --  134*  --  137  K 3.3* 3.9  --  3.2*  --  3.6  CL 94* 101  --  94*  --  95*  CO2 27 31  --  34*  --  32  GLUCOSE 178* 95  --  102*  --  85  BUN 10 8  --  <5*  --  8  CREATININE 1.04* 0.61  --  0.59  --  0.64  CALCIUM 9.0 8.7*  --  8.6*  --  9.2  MG  --  1.4* 2.2 1.9 1.8 1.9  PHOS  --  4.2 3.8 3.6 3.5 4.1     GFR: Estimated Creatinine Clearance: 33.1 mL/min (by C-G formula based on SCr of 0.64 mg/dL).  Liver Function Tests: Recent Labs  Lab 09/06/22 0104 09/08/22 0619  AST 14* 19  ALT 14 15  ALKPHOS 34* 42  BILITOT 0.7 0.4  PROT 5.0* 5.6*  ALBUMIN 3.0* 2.9*    No results for input(s): "LIPASE", "AMYLASE" in the last 168 hours. Recent Labs  Lab 09/06/22 0822  AMMONIA <10     Coagulation Profile: Recent Labs  Lab 09/06/22 0822  INR 1.2     Cardiac Enzymes: No results for input(s): "CKTOTAL", "CKMB", "CKMBINDEX", "TROPONINI" in the last 168 hours.  BNP (last 3 results) No results for input(s): "PROBNP" in the last 8760  hours.  Lipid Profile: Recent Labs    09/08/22 0619  CHOL 109  HDL 44  LDLCALC 55  TRIG 49  CHOLHDL 2.5  LDLDIRECT 54     Thyroid Function Tests: No results for input(s): "TSH", "T4TOTAL", "FREET4", "T3FREE", "THYROIDAB" in the last 72 hours.  Anemia Panel: No results for input(s): "VITAMINB12", "FOLATE", "FERRITIN", "TIBC", "IRON", "RETICCTPCT" in the last 72 hours.  Urine analysis:    Component Value Date/Time   COLORURINE YELLOW 09/06/2022 0248   APPEARANCEUR HAZY (A) 09/06/2022 0248   LABSPEC 1.009 09/06/2022 0248   PHURINE 7.0 09/06/2022 0248   GLUCOSEU NEGATIVE 09/06/2022 0248   HGBUR NEGATIVE 09/06/2022 0248   BILIRUBINUR NEGATIVE 09/06/2022 0248   BILIRUBINUR neg 07/05/2013 1027   KETONESUR NEGATIVE 09/06/2022 0248   PROTEINUR 30 (A) 09/06/2022 0248   UROBILINOGEN 0.2 09/03/2015 1220   NITRITE NEGATIVE 09/06/2022 0248   LEUKOCYTESUR NEGATIVE 09/06/2022 0248    Sepsis Labs: Lactic Acid, Venous    Component Value Date/Time   LATICACIDVEN 1.6 09/06/2022 0314    MICROBIOLOGY: Recent Results (from the past 240 hour(s))  SARS Coronavirus 2 by RT PCR (hospital order, performed in [facility] hospital lab) *cepheid single result test* Anterior Nasal Swab     Status: None   Collection Time: 09/06/22  7:28 AM   Specimen: Anterior Nasal Swab  Result Value Ref Range Status   SARS Coronavirus 2 by RT PCR NEGATIVE NEGATIVE Final    Comment: (NOTE) SARS-CoV-2 target nucleic acids are NOT DETECTED.  The SARS-CoV-2 RNA is generally detectable in upper and lower respiratory specimens during the acute phase of infection. The lowest concentration of SARS-CoV-2 viral copies this assay can detect is 250 copies / mL. A negative result does not preclude SARS-CoV-2 infection and should not be used as the sole basis for treatment or other patient management decisions.  A negative result may occur with improper specimen collection / handling, submission of specimen  other than nasopharyngeal swab, presence of viral mutation(s) within the areas targeted by this assay, and inadequate number of viral copies (<250 copies / mL). A negative result must be combined with clinical observations, patient history, and epidemiological information.  Fact Sheet for Patients:   https://www.patel.info/  Fact Sheet for Healthcare Providers: https://hall.com/  This test is  not yet approved or  cleared by the [place] and has been authorized for detection and/or diagnosis of SARS-CoV-2 by FDA under an Emergency Use Authorization (EUA).  This EUA will remain in effect (meaning this test can be used) for the duration of the COVID-19 declaration under Section 564(b)(1) of the Act, 21 U.S.C. section 360bbb-3(b)(1), unless the authorization is terminated or revoked sooner.  Performed at [facility], [place] [address], [place], [place] [address]   MRSA Next Gen by PCR, Nasal     Status: Abnormal   Collection Time: 09/06/22  5:40 PM   Specimen: Nasal Mucosa; Nasal Swab  Result Value Ref Range Status   MRSA by PCR Next Gen DETECTED (A) NOT DETECTED Final    Comment: RESULT CALLED TO, READ BACK BY AND VERIFIED WITH: RN  Cellar [phone]'$  FH (NOTE) The GeneXpert MRSA Assay (FDA approved for NASAL specimens only), is one component of a comprehensive MRSA colonization surveillance program. It is not intended to diagnose MRSA infection nor to guide or monitor treatment for MRSA infections. Test performance is not FDA approved in patients less than 61 years old. Performed at [facility], [place] [address] [place], [place] [address]     RADIOLOGY STUDIES/RESULTS: VAS [place] LOWER EXTREMITY VENOUS (DVT)  Result Date: 09/08/2022  Lower Venous DVT Study Patient Name:  Carol [name]  Date of Exam:   09/08/2022 Medical Rec #: [id number]          Accession #:    [id number] Date of Birth: 02-Nov-1936          Patient Gender: F Patient Age:   7 years Exam Location:  [facility] Procedure:      VAS [place] LOWER EXTREMITY VENOUS (DVT) Referring Phys: Collier Bullock --------------------------------------------------------------------------------  Indications: "Unresponsive episode".  Limitations: Body habitus (BMI 16.92). Positioning in bed with knees bent secondary to back pain. Comparison Study: No prior study on file Performing Technologist: Sharion Dove RVS  Examination Guidelines: A complete evaluation includes B-mode imaging, spectral Doppler, color Doppler, and power Doppler as needed of all accessible portions of each vessel. Bilateral testing is considered an integral part of a complete examination. Limited examinations for reoccurring indications may be performed as noted. The reflux portion of the exam is performed with the patient in reverse Trendelenburg.  +---------+---------------+---------+-----------+----------+---------------+ RIGHT    CompressibilityPhasicitySpontaneityPropertiesThrombus Aging  +---------+---------------+---------+-----------+----------+---------------+ CFV      Full           Yes      Yes                                  +---------+---------------+---------+-----------+----------+---------------+ SFJ      Full                                                         +---------+---------------+---------+-----------+----------+---------------+ FV Prox  Full                                                         +---------+---------------+---------+-----------+----------+---------------+ FV Mid   Full                                                         +---------+---------------+---------+-----------+----------+---------------+  FV DistalFull           Yes      Yes                                  +---------+---------------+---------+-----------+----------+---------------+ PFV      Full                                                          +---------+---------------+---------+-----------+----------+---------------+ POP      Full           Yes      Yes                                  +---------+---------------+---------+-----------+----------+---------------+ PTV                                                   patent by color +---------+---------------+---------+-----------+----------+---------------+ PERO                                                  patent by color +---------+---------------+---------+-----------+----------+---------------+   +---------+---------------+---------+-----------+----------+--------------+ LEFT     CompressibilityPhasicitySpontaneityPropertiesThrombus Aging +---------+---------------+---------+-----------+----------+--------------+ CFV      Full           Yes      Yes                                 +---------+---------------+---------+-----------+----------+--------------+ SFJ      Full                                                        +---------+---------------+---------+-----------+----------+--------------+ FV Prox  Full                                                        +---------+---------------+---------+-----------+----------+--------------+ FV Mid   Full                                                        +---------+---------------+---------+-----------+----------+--------------+ FV DistalFull                                                        +---------+---------------+---------+-----------+----------+--------------+ PFV  Full                                                        +---------+---------------+---------+-----------+----------+--------------+ POP      Full           Yes      Yes                                 +---------+---------------+---------+-----------+----------+--------------+ PTV      Full                                                         +---------+---------------+---------+-----------+----------+--------------+ PERO     Full                                                        +---------+---------------+---------+-----------+----------+--------------+     Summary: BILATERAL: - No evidence of deep vein thrombosis seen in the lower extremities, bilaterally. -No evidence of popliteal cyst, bilaterally.   *See table(s) above for measurements and observations. Electronically signed by Monica Martinez MD on 09/08/2022 at 6:59:03 PM.    Final      LOS: 3 days   Oren Binet, MD  [facility]    To contact the attending provider between 7A-7P or the covering provider during after hours 7P-7A, please log into the web site [email or web address] and access using universal  password for that web site. If you do not have the password, please call the hospital operator.  09/09/2022, 11:03 AM

## 2022-09-09 NOTE — Progress Notes (Signed)
Received pt to 6n14 from 60M. Denies nausea/pain at this time.

## 2022-09-09 NOTE — Progress Notes (Signed)
Progress Note  Patient Name: Carol [name] Date of Encounter: 09/09/2022  Attending physician: Jonetta Osgood, MD Primary care provider: Shirline Frees, MD  Subjective: Carol [name] is a 86 y.o. female who was seen and examined at bedside  Patient and family having palliative care meeting. Patient is accompanied by her daughter Kennyth Lose, son Timmothy Sours, and 2 granddaughters. She denies chest pain or shortness of breath. Sitting upright and more conversing   Objective: Vital Signs in the last 24 hours: Temp:  [97.5 F (36.4 C)-98.2 F (36.8 C)] 97.5 F (36.4 C) (10/15 1527) Pulse Rate:  [69-94] 72 (10/15 1527) Resp:  [17-37] 17 (10/15 1527) BP: (102-151)/(59-76) 102/64 (10/15 1527) SpO2:  [94 %-100 %] 95 % (10/15 1527)  Intake/Output: No intake or output data in the 24 hours ending 09/09/22 1611   Net IO Since Admission: 740.54 mL [09/09/22 1611]  Weights:  Filed Weights   09/06/22 0058 09/08/22 0436  Weight: 37.3 kg 39.3 kg    Telemetry: Personally reviewed.  Sinus rhythm without significant ectopy  Physical examination: PHYSICAL EXAM:    09/09/2022    3:27 PM 09/09/2022    8:14 AM 09/09/2022    4:57 AM  Vitals with BMI  Systolic 591 638 466  Diastolic 64 59 70  Pulse 72 93 94    General: Appears older than stated age, cachectic, hemodynamically stable, no acute distress HEENT: Moist mucous membranes, no JVP, trachea midline, no carotid bruits Lungs: Clear to auscultation bilaterally.  No wheezes rales or rhonchi's. Heart: regular, positive S1-S2, no murmurs rubs or gallops appreciated Abdomen: Cachectic, soft, nontender, nondistended, positive bowel sounds in all 4 quadrants Extremities: No pitting edema, 2+ DP and PT pulses, warm to touch Neuro: Alert to person, place, time, and situation.  Nonfocal. Psych: Normal mood and affect normal behavior.  Cooperative  Lab Results: Hematology Recent Labs  Lab 09/06/22 0104 09/07/22 0143 09/08/22 0619   WBC 8.7 7.5 8.3  RBC 3.20* 3.29* 3.71*  HGB 11.1* 11.3* 12.5  HCT 32.3* 33.4* 37.4  MCV 100.9* 101.5* 100.8*  MCH 34.7* 34.3* 33.7  MCHC 34.4 33.8 33.4  RDW 12.5 12.9 12.9  PLT 193 173 210    Chemistry Recent Labs  Lab 09/06/22 0104 09/07/22 0143 09/08/22 0619 09/09/22 0545  NA 133* 140 134* 137  K 3.3* 3.9 3.2* 3.6  CL 94* 101 94* 95*  CO2 27 31 34* 32  GLUCOSE 178* 95 102* 85  BUN 10 8 <5* 8  CREATININE 1.04* 0.61 0.59 0.64  CALCIUM 9.0 8.7* 8.6* 9.2  PROT 5.0*  --  5.6*  --   ALBUMIN 3.0*  --  2.9*  --   AST 14*  --  19  --   ALT 14  --  15  --   ALKPHOS 34*  --  42  --   BILITOT 0.7  --  0.4  --   GFRNONAA 53* >60 >60 >60  ANIONGAP '12 8 6 10     '$ Cardiac Enzymes: Cardiac Panel (last 3 results) No results for input(s): "CKTOTAL", "CKMB", "TROPONINIHS", "RELINDX" in the last 72 hours.   BNP (last 3 results) Recent Labs    03/16/22 1810  BNP 147.9*    ProBNP (last 3 results) No results for input(s): "PROBNP" in the last 8760 hours.   DDimer  Recent Labs  Lab 09/07/22 1045  DDIMER 1.28*     Hemoglobin A1c: No results found for: "HGBA1C", "MPG"  TSH  Recent Labs  03/16/22 2155 05/13/22 1859  TSH 1.194 1.292    Lipid Panel     Component Value Date/Time   CHOL 109 09/08/2022 0619   TRIG 49 09/08/2022 0619   HDL 44 09/08/2022 0619   CHOLHDL 2.5 09/08/2022 0619   VLDL 10 09/08/2022 0619   LDLCALC 55 09/08/2022 0619   LDLDIRECT 54 09/08/2022 0619    Imaging: VAS [place] LOWER EXTREMITY VENOUS (DVT)  Result Date: 09/08/2022  Lower Venous DVT Study Patient Name:  Carol [name]  Date of Exam:   09/08/2022 Medical Rec #: [id number]          Accession #:    [id number] Date of Birth: [birth year]         Patient Gender: F Patient Age:   29 years Exam Location:  [facility] Procedure:      VAS [place] LOWER EXTREMITY VENOUS (DVT) Referring Phys: Collier Bullock --------------------------------------------------------------------------------   Indications: "Unresponsive episode".  Limitations: Body habitus (BMI 16.92). Positioning in bed with knees bent secondary to back pain. Comparison Study: No prior study on file Performing Technologist: Sharion Dove RVS  Examination Guidelines: A complete evaluation includes B-mode imaging, spectral Doppler, color Doppler, and power Doppler as needed of all accessible portions of each vessel. Bilateral testing is considered an integral part of a complete examination. Limited examinations for reoccurring indications may be performed as noted. The reflux portion of the exam is performed with the patient in reverse Trendelenburg.  +---------+---------------+---------+-----------+----------+---------------+ RIGHT    CompressibilityPhasicitySpontaneityPropertiesThrombus Aging  +---------+---------------+---------+-----------+----------+---------------+ CFV      Full           Yes      Yes                                  +---------+---------------+---------+-----------+----------+---------------+ SFJ      Full                                                         +---------+---------------+---------+-----------+----------+---------------+ FV Prox  Full                                                         +---------+---------------+---------+-----------+----------+---------------+ FV Mid   Full                                                         +---------+---------------+---------+-----------+----------+---------------+ FV DistalFull           Yes      Yes                                  +---------+---------------+---------+-----------+----------+---------------+ PFV      Full                                                         +---------+---------------+---------+-----------+----------+---------------+  POP      Full           Yes      Yes                                  +---------+---------------+---------+-----------+----------+---------------+  PTV                                                   patent by color +---------+---------------+---------+-----------+----------+---------------+ PERO                                                  patent by color +---------+---------------+---------+-----------+----------+---------------+   +---------+---------------+---------+-----------+----------+--------------+ LEFT     CompressibilityPhasicitySpontaneityPropertiesThrombus Aging +---------+---------------+---------+-----------+----------+--------------+ CFV      Full           Yes      Yes                                 +---------+---------------+---------+-----------+----------+--------------+ SFJ      Full                                                        +---------+---------------+---------+-----------+----------+--------------+ FV Prox  Full                                                        +---------+---------------+---------+-----------+----------+--------------+ FV Mid   Full                                                        +---------+---------------+---------+-----------+----------+--------------+ FV DistalFull                                                        +---------+---------------+---------+-----------+----------+--------------+ PFV      Full                                                        +---------+---------------+---------+-----------+----------+--------------+ POP      Full           Yes      Yes                                 +---------+---------------+---------+-----------+----------+--------------+ PTV  Full                                                        +---------+---------------+---------+-----------+----------+--------------+ PERO     Full                                                        +---------+---------------+---------+-----------+----------+--------------+     Summary: BILATERAL: - No evidence of  deep vein thrombosis seen in the lower extremities, bilaterally. -No evidence of popliteal cyst, bilaterally.   *See table(s) above for measurements and observations. Electronically signed by Monica Martinez MD on 09/08/2022 at 6:59:03 PM.    Final     CARDIAC DATABASE: EKG: 09/07/2022: Sinus rhythm, left bundle branch block, 76 bpm, without underlying injury pattern.   Echocardiogram: 09/06/2022: LVEF 20-25%, global hypokinesis, mild LVH, mild LAE, trivial MR, trivial pericardial effusion. ~Personally reviewed the echocardiogram which notes severely reduced LVEF with regional wall motion normalities.    Scheduled Meds:  aspirin  81 mg Oral Daily   busPIRone  5 mg Oral BID   Chlorhexidine Gluconate Cloth  6 each Topical Daily   dapagliflozin propanediol  10 mg Oral Daily   enoxaparin (LOVENOX) injection  30 mg Subcutaneous Q24H   feeding supplement  237 mL Oral BID BM   fluticasone furoate-vilanterol  1 puff Inhalation Daily   And   umeclidinium bromide  1 puff Inhalation Daily   furosemide  20 mg Intravenous Daily   ivabradine  5 mg Oral BID WC   LORazepam  0.5 mg Oral TID   metoprolol tartrate  25 mg Oral BID   mirtazapine  7.5 mg Oral QHS   Followed by   Derrill Memo ON 09/14/2022] mirtazapine  15 mg Oral QHS   multivitamin with minerals  1 tablet Oral Daily   mupirocin ointment  1 Application Nasal BID   nicotine  21 mg Transdermal Daily   mouth rinse  15 mL Mouth Rinse 4 times per day   pantoprazole  40 mg Oral Daily   raloxifene  60 mg Oral Daily   simvastatin  20 mg Oral QHS   spironolactone  12.5 mg Oral Daily   venlafaxine XR  150 mg Oral Q breakfast   Followed by   Derrill Memo ON 09/11/2022] venlafaxine XR  75 mg Oral Q breakfast   Followed by   Derrill Memo ON 09/15/2022] venlafaxine XR  37.5 mg Oral Q breakfast    Continuous Infusions:  sodium chloride 20 mL/hr at 09/06/22 0938    PRN Meds: acetaminophen, haloperidol lactate, ipratropium-albuterol, mouth rinse, sodium  chloride   IMPRESSION & RECOMMENDATIONS: Carol [name] is a 86 y.o. Caucasian female whose past medical history and cardiac risk factors include: Newly discovered cardiomyopathy, COPD, hypertension, hyperlipidemia, current smoker, advanced age  Impression: Newly discovered cardiomyopathy/HFrEF. Elevated troponins likely secondary to supply demand ischemia Aortic atherosclerosis, coronary artery calcification (CT scan chest 06/2022) Acute encephalopathy on presentation - resolved Acute kidney injury on presentation - resolved Hypertension. Hyperlipidemia. Current smoker. COPD/emphysema  Recommendations: Patient presented with acute encephalopathy was admitted to ICU for further evaluation and management.  During the work-up echocardiogram was performed which noted reduced LVEF  and therefore cardiology was consulted for further recommendations.  Patient is noted to have severely reduced LVEF consistent with new onset of cardiomyopathy.  Clinically not in overt heart failure and well compensated.  Overall chronicity of her underlying cardiomyopathy is unknown.  However, given her advanced age, dementia, frailty, and other comorbid conditions family were always inclined to medical management.  She had a family meeting with palliative care and echoed the same today.  She has been started on guideline directed medical therapy and has tolerated it well.  Hemodynamics have improved.  She is currently on Corlanor, Lasix, Farxiga, metoprolol, spironolactone, aspirin, statin therapy.  Reemphasized importance of complete smoking cessation.  Patient voices that she would like to continue medical therapy and avoid invasive procedures such as heart catheterization, stents, etc.  Patient is currently DNR.  From a cardiovascular standpoint she is optimized for now.  Recommend outpatient follow-up with cardiology for further medication titration as per patient and family goals of care.  We will hold  off on adding ACE inhibitor/ARB/ARNI given the soft blood pressures and the risk of falls given her frailty.  For now we will focus on volume management.  However as outpatient if possible would like to wean her off Corlanor and start either ARB/low-dose Arni.  We will follow peripherally.  Patient's questions and concerns were addressed to her satisfaction. She voices understanding of the instructions provided during this encounter.   This note was created using a voice recognition software as a result there may be grammatical errors inadvertently enclosed that do not reflect the nature of this encounter. Every attempt is made to correct such errors.  Total time spent: 35 minutes.  Mechele Claude [facility]  Pager: [phone] Office: [phone] 09/09/2022, 4:11 PM

## 2022-09-09 NOTE — Progress Notes (Signed)
Daily Progress Note   Patient Name: Carol [name]       Date: 09/09/2022 DOB: 08/31/1936  Age: 86 y.o. MRN#: [id number] Attending Physician: Jonetta Osgood, MD Primary Care Physician: Shirline Frees, MD Admit Date: 09/06/2022  Reason for Consultation/Follow-up: Establishing goals of care  Subjective: Medical records reviewed including progress notes, labs, imaging. Patient assessed at the bedside.  She denies any symptoms, appears comfortable and continues to improve.  She is able to stand and transfer to wheelchair.  Her daughter, granddaughters, son, and her son on the phone are present for scheduled family meeting.  Created space and opportunity for patient's thoughts and feelings on her current illness.  Her biggest concern is her anxiety and how "things got this far."  Provided education on generalized anxiety disorder.  I then reviewed interval history since her admission and also her chronic comorbidities and failure to thrive syndrome.  Emphasized the importance of anticipatory care planning and provided detailed review of her options including life-prolonging interventions, particularly rehabilitation and nutritional interventions, as well as comfort focused care.  Emphasized the importance of patient sharing her quality of life and treatment burden with her family, as she is the only one who is able to tell them when she has reached the point that she is ready to focus solely on her comfort.  Ultimately, patient is motivated to participate in rehab and is willing to go to SNF given that family is no longer able to provide 24/7 care at this time.  She is also interested in improving her nutrition and try her best to eat, avoiding artificial nutrition.  The risk and benefits of  artificial nutrition were reviewed and I recommended against a feeding tube should the patient continue to have poor nutrition despite best efforts.  Family is in agreement at this time that they would rather avoid this.  We also discussed other heroics and aggressive interventions in context from new CHF and they would prefer medical management.  A MOST form was introduced extensive conversation was had, covering concepts specific to code status, artifical feeding and hydration, continued IV antibiotics and rehospitalization.     Questions and concerns addressed. PMT will continue to support holistically.   Length of Stay: 3  Physical Exam Vitals and nursing note reviewed.  Constitutional:  General: She is not in acute distress.    Interventions: Nasal cannula in place.  Cardiovascular:     Rate and Rhythm: Normal rate.  Pulmonary:     Effort: Pulmonary effort is normal.  Skin:    General: Skin is warm and dry.  Neurological:     Mental Status: She is alert.  Psychiatric:        Mood and Affect: Mood normal.        Behavior: Behavior normal. Behavior is cooperative.            Vital Signs: BP (!) 126/59 (BP Location: Right Arm)   Pulse 93   Temp 98.2 F (36.8 C)   Resp 17   Ht 5' (1.524 m)   Wt 39.3 kg   SpO2 100%   BMI 16.92 kg/m  SpO2: SpO2: 100 % O2 Device: O2 Device: Nasal Cannula O2 Flow Rate: O2 Flow Rate (L/min): 4 L/min      Palliative Assessment/Data: 40 to 50%   Palliative Care Assessment & Plan   Patient Profile: 86 y.o. female  with past medical history of COPD, depression/anxiety, hyponatremia, HTN admitted on 09/06/2022 with unresponsiveness.    Patient admitted with shock from hypovolemia vs medication from benzodiazepines and acute on chronic hypoxemic respiratory failure. PMT has been consulted to assist with goals of care conversation.  Assessment: Goals of care conversation New diagnosis of HFrEF Acute toxic metabolic encephalopathy,  improved Syncopal episode AKI, resolved COPD Failure to thrive/debility and deconditioning  Recommendations/Plan: Continue DNR Continue current care, no heroics/aggressive interventions but rather focus on medical management, oral intake, participation with PT/OT/SLP Goal is for SNF with outpatient palliative care; MOST form was introduced today Psychosocial and emotional support provided PMT will continue to follow and support   Prognosis: Guarded to poor, failure to thrive with chronic debility and several chronic comorbidities  Discharge Planning: [place] for rehab with Palliative care service follow-up  Care plan was discussed with patient, patient's daughter, 2 sons, patient's 2 granddaughters, Dr. Terri Skains   Total time: I spent 75 minutes in the care of the patient today in the above activities and documenting the encounter.   Dorthy Cooler, PA-C Palliative Medicine Team Team phone # [phone]  Thank you for allowing the Palliative Medicine Team to assist in the care of this patient. Please utilize secure chat with additional questions, if there is no response within 30 minutes please call the above phone number.  Palliative Medicine Team providers are available by phone from 7am to 7pm daily and can be reached through the team cell phone.  Should this patient require assistance outside of these hours, please call the patient's attending physician.

## 2022-09-09 NOTE — Plan of Care (Signed)
  Problem: Education: Goal: Knowledge of General Education information will improve Description: Including pain rating scale, medication(s)/side effects and non-pharmacologic comfort measures Outcome: Progressing   Problem: Clinical Measurements: Goal: Will remain free from infection Outcome: Progressing   

## 2022-09-10 DIAGNOSIS — I5020 Unspecified systolic (congestive) heart failure: Secondary | ICD-10-CM | POA: Diagnosis not present

## 2022-09-10 DIAGNOSIS — I2510 Atherosclerotic heart disease of native coronary artery without angina pectoris: Secondary | ICD-10-CM | POA: Diagnosis not present

## 2022-09-10 DIAGNOSIS — I429 Cardiomyopathy, unspecified: Secondary | ICD-10-CM

## 2022-09-10 DIAGNOSIS — I700 Atherosclerosis of aorta: Secondary | ICD-10-CM

## 2022-09-10 DIAGNOSIS — I2584 Coronary atherosclerosis due to calcified coronary lesion: Secondary | ICD-10-CM

## 2022-09-10 DIAGNOSIS — E782 Mixed hyperlipidemia: Secondary | ICD-10-CM

## 2022-09-10 LAB — BASIC METABOLIC PANEL
Anion gap: 14 (ref 5–15)
BUN: 14 mg/dL (ref 8–23)
CO2: 29 mmol/L (ref 22–32)
Calcium: 9.2 mg/dL (ref 8.9–10.3)
Chloride: 93 mmol/L — ABNORMAL LOW (ref 98–111)
Creatinine, Ser: 0.6 mg/dL (ref 0.44–1.00)
GFR, Estimated: >60 mL/min (ref >60)
Glucose, Bld: 73 mg/dL (ref 70–99)
Potassium: 3.5 mmol/L (ref 3.5–5.1)
Sodium: 136 mmol/L (ref 135–145)

## 2022-09-10 LAB — BRAIN NATRIURETIC PEPTIDE: B Natriuretic Peptide: 546.1 pg/mL — ABNORMAL HIGH (ref 0.0–100.0)

## 2022-09-10 MED ORDER — METOPROLOL SUCCINATE ER 25 MG PO TB24
50.0000 mg | ORAL_TABLET | Freq: Every morning | ORAL | Status: DC
  Administered 2022-09-10 – 2022-09-14 (×4): 50 mg via ORAL
  Filled 2022-09-10 (×7): qty 2

## 2022-09-10 MED ORDER — LORAZEPAM 0.5 MG PO TABS
0.5000 mg | ORAL_TABLET | Freq: Two times a day (BID) | ORAL | Status: DC
  Administered 2022-09-10 – 2022-09-15 (×8): 0.5 mg via ORAL
  Filled 2022-09-10 (×10): qty 1

## 2022-09-10 MED ORDER — LOSARTAN POTASSIUM 25 MG PO TABS
25.0000 mg | ORAL_TABLET | Freq: Every evening | ORAL | Status: DC
  Administered 2022-09-10 – 2022-09-14 (×4): 25 mg via ORAL
  Filled 2022-09-10 (×5): qty 1

## 2022-09-10 MED ORDER — LORAZEPAM 1 MG PO TABS
1.0000 mg | ORAL_TABLET | Freq: Every day | ORAL | Status: DC
  Administered 2022-09-10 – 2022-09-14 (×6): 1 mg via ORAL
  Filled 2022-09-10 (×5): qty 1

## 2022-09-10 MED ORDER — MELATONIN 3 MG PO TABS
3.0000 mg | ORAL_TABLET | Freq: Every day | ORAL | Status: DC
  Administered 2022-09-10 – 2022-09-14 (×5): 3 mg via ORAL
  Filled 2022-09-10 (×5): qty 1

## 2022-09-10 NOTE — Progress Notes (Signed)
Progress Note  Patient Name: Carol A Linares Date of Encounter: 09/10/2022  Attending physician: Jonetta Osgood, MD Primary care provider: Shirline Frees, MD  Subjective: Carol [name] is a 86 y.o. female who was seen and examined at bedside  Having breakfast.  Grand daughter at bedside.  No chest pain or HF symptoms.    Objective: Vital Signs in the last 24 hours: Temp:  [97.5 F (36.4 C)-97.8 F (36.6 C)] 97.8 F (36.6 C) (10/16 0748) Pulse Rate:  [70-93] 70 (10/16 0748) Resp:  [17-18] 17 (10/16 0748) BP: (96-127)/(58-76) 127/65 (10/16 0748) SpO2:  [88 %-100 %] 100 % (10/16 0748) Weight:  [36.7 kg] 36.7 kg (10/16 0500)  Intake/Output:  Intake/Output Summary (Last 24 hours) at 09/10/2022 1028 Last data filed at 09/10/2022 0640 Gross per 24 hour  Intake 120 ml  Output --  Net 120 ml     Net IO Since Admission: 860.54 mL [09/10/22 1028]  Weights:  Filed Weights   09/06/22 0058 09/08/22 0436 09/10/22 0500  Weight: 37.3 kg 39.3 kg 36.7 kg    Telemetry: Personally reviewed.  Sinus rhythm without significant ectopy  Physical examination: PHYSICAL EXAM:    09/10/2022    7:48 AM 09/10/2022    6:43 AM 09/10/2022    5:00 AM  Vitals with BMI  Weight   80 lbs 15 oz  BMI   28.3  Systolic 151 761   Diastolic 65 76   Pulse 70 90     General: Appears older than stated age, cachectic, hemodynamically stable, no acute distress HEENT: Moist mucous membranes, no JVP, trachea midline, no carotid bruits, [place] oxygen.  Lungs: Clear to auscultation bilaterally.  No wheezes rales or rhonchi's. Heart: regular, positive S1-S2, no murmurs rubs or gallops appreciated Abdomen: Cachectic, soft, nontender, nondistended, positive bowel sounds in all 4 quadrants Extremities: No pitting edema, 2+ DP and PT pulses, warm to touch Neuro: Alert to person, place, time, and situation.  Nonfocal. Psych: Normal mood and affect normal behavior.  Cooperative  Lab  Results: Hematology Recent Labs  Lab 09/06/22 0104 09/07/22 0143 09/08/22 0619  WBC 8.7 7.5 8.3  RBC 3.20* 3.29* 3.71*  HGB 11.1* 11.3* 12.5  HCT 32.3* 33.4* 37.4  MCV 100.9* 101.5* 100.8*  MCH 34.7* 34.3* 33.7  MCHC 34.4 33.8 33.4  RDW 12.5 12.9 12.9  PLT 193 173 210    Chemistry Recent Labs  Lab 09/06/22 0104 09/07/22 0143 09/08/22 0619 09/09/22 0545 09/10/22 0222  NA 133*   < > 134* 137 136  K 3.3*   < > 3.2* 3.6 3.5  CL 94*   < > 94* 95* 93*  CO2 27   < > 34* 32 29  GLUCOSE 178*   < > 102* 85 73  BUN 10   < > <5* 8 14  CREATININE 1.04*   < > 0.59 0.64 0.60  CALCIUM 9.0   < > 8.6* 9.2 9.2  PROT 5.0*  --  5.6*  --   --   ALBUMIN 3.0*  --  2.9*  --   --   AST 14*  --  19  --   --   ALT 14  --  15  --   --   ALKPHOS 34*  --  42  --   --   BILITOT 0.7  --  0.4  --   --   GFRNONAA 53*   < > >60 >60 >60  ANIONGAP 12   < > 6  10 14   < > = values in this interval not displayed.     Cardiac Enzymes: Cardiac Panel (last 3 results) No results for input(s): "CKTOTAL", "CKMB", "TROPONINIHS", "RELINDX" in the last 72 hours.   BNP (last 3 results) Recent Labs    03/16/22 1810  BNP 147.9*    ProBNP (last 3 results) No results for input(s): "PROBNP" in the last 8760 hours.   DDimer  Recent Labs  Lab 09/07/22 1045  DDIMER 1.28*     Hemoglobin A1c: No results found for: "HGBA1C", "MPG"  TSH  Recent Labs    03/16/22 2155 05/13/22 1859  TSH 1.194 1.292    Lipid Panel     Component Value Date/Time   CHOL 109 09/08/2022 0619   TRIG 49 09/08/2022 0619   HDL 44 09/08/2022 0619   CHOLHDL 2.5 09/08/2022 0619   VLDL 10 09/08/2022 0619   LDLCALC 55 09/08/2022 0619   LDLDIRECT 54 09/08/2022 0619    Imaging: VAS [place] LOWER EXTREMITY VENOUS (DVT)  Result Date: 09/08/2022  Lower Venous DVT Study Patient Name:  Carol A Quarry  Date of Exam:   09/08/2022 Medical Rec #: [id number]          Accession #:    [id number] Date of Birth: 1936/10/24          Patient Gender: F Patient Age:   70 years Exam Location:  [facility] Procedure:      VAS [place] LOWER EXTREMITY VENOUS (DVT) Referring Phys: Collier Bullock --------------------------------------------------------------------------------  Indications: "Unresponsive episode".  Limitations: Body habitus (BMI 16.92). Positioning in bed with knees bent secondary to back pain. Comparison Study: No prior study on file Performing Technologist: Sharion Dove RVS  Examination Guidelines: A complete evaluation includes B-mode imaging, spectral Doppler, color Doppler, and power Doppler as needed of all accessible portions of each vessel. Bilateral testing is considered an integral part of a complete examination. Limited examinations for reoccurring indications may be performed as noted. The reflux portion of the exam is performed with the patient in reverse Trendelenburg.  +---------+---------------+---------+-----------+----------+---------------+ RIGHT    CompressibilityPhasicitySpontaneityPropertiesThrombus Aging  +---------+---------------+---------+-----------+----------+---------------+ CFV      Full           Yes      Yes                                  +---------+---------------+---------+-----------+----------+---------------+ SFJ      Full                                                         +---------+---------------+---------+-----------+----------+---------------+ FV Prox  Full                                                         +---------+---------------+---------+-----------+----------+---------------+ FV Mid   Full                                                         +---------+---------------+---------+-----------+----------+---------------+  FV DistalFull           Yes      Yes                                  +---------+---------------+---------+-----------+----------+---------------+ PFV      Full                                                          +---------+---------------+---------+-----------+----------+---------------+ POP      Full           Yes      Yes                                  +---------+---------------+---------+-----------+----------+---------------+ PTV                                                   patent by color +---------+---------------+---------+-----------+----------+---------------+ PERO                                                  patent by color +---------+---------------+---------+-----------+----------+---------------+   +---------+---------------+---------+-----------+----------+--------------+ LEFT     CompressibilityPhasicitySpontaneityPropertiesThrombus Aging +---------+---------------+---------+-----------+----------+--------------+ CFV      Full           Yes      Yes                                 +---------+---------------+---------+-----------+----------+--------------+ SFJ      Full                                                        +---------+---------------+---------+-----------+----------+--------------+ FV Prox  Full                                                        +---------+---------------+---------+-----------+----------+--------------+ FV Mid   Full                                                        +---------+---------------+---------+-----------+----------+--------------+ FV DistalFull                                                        +---------+---------------+---------+-----------+----------+--------------+ PFV  Full                                                        +---------+---------------+---------+-----------+----------+--------------+ POP      Full           Yes      Yes                                 +---------+---------------+---------+-----------+----------+--------------+ PTV      Full                                                         +---------+---------------+---------+-----------+----------+--------------+ PERO     Full                                                        +---------+---------------+---------+-----------+----------+--------------+     Summary: BILATERAL: - No evidence of deep vein thrombosis seen in the lower extremities, bilaterally. -No evidence of popliteal cyst, bilaterally.   *See table(s) above for measurements and observations. Electronically signed by Monica Martinez MD on 09/08/2022 at 6:59:03 PM.    Final     CARDIAC DATABASE: EKG: 09/07/2022: Sinus rhythm, left bundle branch block, 76 bpm, without underlying injury pattern.   Echocardiogram: 09/06/2022: LVEF 20-25%, global hypokinesis, mild LVH, mild LAE, trivial MR, trivial pericardial effusion. ~Personally reviewed the echocardiogram which notes severely reduced LVEF with regional wall motion normalities.    Scheduled Meds:  aspirin  81 mg Oral Daily   busPIRone  5 mg Oral BID   Chlorhexidine Gluconate Cloth  6 each Topical Daily   dapagliflozin propanediol  10 mg Oral Daily   enoxaparin (LOVENOX) injection  30 mg Subcutaneous Q24H   feeding supplement  237 mL Oral BID BM   fluticasone furoate-vilanterol  1 puff Inhalation Daily   And   umeclidinium bromide  1 puff Inhalation Daily   furosemide  20 mg Intravenous Daily   ivabradine  5 mg Oral BID WC   LORazepam  0.5 mg Oral TID   losartan  25 mg Oral QPM   metoprolol succinate  50 mg Oral q AM   mirtazapine  7.5 mg Oral QHS   Followed by   Derrill Memo ON 09/14/2022] mirtazapine  15 mg Oral QHS   multivitamin with minerals  1 tablet Oral Daily   mupirocin ointment  1 Application Nasal BID   nicotine  21 mg Transdermal Daily   mouth rinse  15 mL Mouth Rinse 4 times per day   pantoprazole  40 mg Oral Daily   raloxifene  60 mg Oral Daily   simvastatin  20 mg Oral QHS   spironolactone  12.5 mg Oral Daily   venlafaxine XR  150 mg Oral Q breakfast   Followed by   Derrill Memo ON  09/11/2022] venlafaxine XR  75 mg Oral Q breakfast   Followed by   Derrill Memo ON 09/15/2022] venlafaxine XR  37.5 mg  Oral Q breakfast    Continuous Infusions:  sodium chloride 20 mL/hr at 09/06/22 0938    PRN Meds: acetaminophen, haloperidol lactate, ipratropium-albuterol, mouth rinse, sodium chloride   IMPRESSION & RECOMMENDATIONS: Carol A Meigs is a 86 y.o. Caucasian female whose past medical history and cardiac risk factors include: Newly discovered cardiomyopathy, COPD, hypertension, hyperlipidemia, current smoker, advanced age  Impression: Newly discovered cardiomyopathy/HFrEF. Elevated troponins likely secondary to supply demand ischemia Aortic atherosclerosis, coronary artery calcification (CT scan chest 06/2022) Acute encephalopathy on presentation - resolved Acute kidney injury on presentation - resolved Hypertension. Hyperlipidemia. Current smoker. COPD/emphysema  Recommendations: Patient presented with acute encephalopathy was admitted to ICU for further evaluation and management.  During the work-up echocardiogram was performed which noted reduced LVEF and therefore cardiology was consulted for further recommendations.  Patient is noted to have severely reduced LVEF consistent with new onset of cardiomyopathy.  Clinically not in overt heart failure and well compensated.  Overall chronicity of her underlying cardiomyopathy is unknown.  However, given her advanced age, dementia, frailty, and other comorbid conditions family prefers medical management.  Family meet w/ palliative care and echoed the same.  She has been started on guideline directed medical therapy and has tolerated it well.  Hemodynamics have improved.  She is currently on Corlanor, Lasix, Farxiga, metoprolol, spironolactone, aspirin, statin therapy.  Change Lopressor to Toprol XL.   Start low dose Losartan at night w/ holding parameters.   Reemphasized importance of complete smoking  cessation.  Patient voices that she would like to continue medical therapy and avoid invasive procedures such as heart catheterization, stents, etc.  Patient is currently DNR.  From a cardiovascular standpoint she is optimized for now.  Recommend outpatient follow-up with cardiology for further medication titration as per patient and family goals of care.  We will follow peripherally and arrange outpatient follow up.   Patient's questions and concerns were addressed to her satisfaction. She voices understanding of the instructions provided during this encounter.   This note was created using a voice recognition software as a result there may be grammatical errors inadvertently enclosed that do not reflect the nature of this encounter. Every attempt is made to correct such errors.  Total time spent: 25 minutes.   Mechele Claude [facility]  Pager: [phone] Office: [phone] 09/10/2022, 10:28 AM

## 2022-09-10 NOTE — Social Work (Signed)
RE: Carol [name] Date of Birth: [birth year] Date: 09/10/2022  Please be advised that the above-named patient will require a short-term nursing home stay - anticipated 30 days or less for rehabilitation and strengthening.  The plan is for return home.

## 2022-09-10 NOTE — Progress Notes (Signed)
PROGRESS NOTE        PATIENT DETAILS Name: Carol [name] Age: 86 y.o. Sex: female Date of Birth: [birth year] Admit Date: 09/06/2022 Admitting Physician Collier Bullock, MD NIO:[id number], Gwyndolyn Saxon, MD  Brief Summary: Patient is a 86 y.o.  female with history of COPD-on home O2 prn, HTN-who presented with a episode of unresponsiveness-family apparently did CPR at home, when EMS arrived she had already achieved spontaneous circulation.  She was hypotensive when she arrived in the ED, she was started on Levophed and subsequently admitted to the ICU by PCCM-upon further evaluation-she was found to have new diagnosis of HFrEF.  Significant events: 10/12>> episode of unresponsiveness-syncope at home-hypotensive in the ED-admit to ICU. 10/14>> transfer to [facility]  Significant studies: 10/12>> CXR: No PNA 10/12>> CT head: No acute intracranial abnormalities. 10/12>> Echo: EF 20-25% 10/14>> bilateral lower extremity Doppler: Negative  Significant microbiology data: 10/12>> COVID PCR: Negative  Procedures: None  Consults: PCCM Cardiology Palliative  Subjective: Had some delirium last evening Awake/alert this morning.  Objective: Vitals: Blood pressure 127/65, pulse 70, temperature 97.8 F (36.6 C), temperature source Oral, resp. rate 17, height 5' (1.524 m), weight 36.7 kg, SpO2 100 %.   Exam: Gen Exam:Alert awake-not in any distress HEENT:atraumatic, normocephalic Chest: B/L clear to auscultation anteriorly CVS:S1S2 regular Abdomen:soft non tender, non distended Extremities:no edema Neurology: Non focal Skin: no rash   Pertinent Labs/Radiology:    Latest Ref Rng & Units 09/08/2022    6:19 AM 09/07/2022    1:43 AM 09/06/2022    1:04 AM  CBC  WBC 4.0 - 10.5 K/uL 8.3  7.5  8.7   Hemoglobin 12.0 - 15.0 g/dL 12.5  11.3  11.1   Hematocrit 36.0 - 46.0 % 37.4  33.4  32.3   Platelets 150 - 400 K/uL 210  173  193     Lab Results  Component Value  Date   NA 136 09/10/2022   K 3.5 09/10/2022   CL 93 (L) 09/10/2022   CO2 29 09/10/2022      Assessment/Plan: Syncopal episode Unclear exactly what happened at home-when she was unresponsive-suspect may have been a syncopal episode. Echo as above Continue telemetry monitoring  Acute toxic metabolic encephalopathy Multifactorial from Ativan use and from hypoperfusion in the setting of hypotension/cardiogenic shock Although overall improved-had delirium overnight-pleasantly confused this morning Maintain delirium precautions.  Multifactorial shock-hypovolemia/side effect of medications/cardiogenic Shock physiology resolved-BP stable.  New diagnosis of HFrEF Euvolemic Cardiology gradually titrating medications-losartan has been added.  Lasix/: Or/Farxiga/metoprolol/Aldactone already on board. Per cardiology note-family prefers gentle medical treatment and elects to defer invasive studies  Minimally elevated troponin Demand ischemia in the setting of hypotension No anginal symptoms Family prefers gentle medical treatment-no invasive studies  AKI Hemodynamically mediated Resolved  Hypokalemia Repleted.  HTN BP stable Continue Aldactone/Lasix/metoprolol/losartan  HLD Continue statin  COPD Chronic hypoxic respiratory failure on as needed home O2 Stable-not on exacerbation Continue bronchodilators  Dysphagia Some concern for silent aspiration Appreciate SLP eval-on dysphagia 2 diet  Weight loss/failure to thrive syndrome Recent TSH in June 2023 stable Suspect she may have frailty/COPD related cachexia/failure to thrive syndrome Now on Remeron-effects were being titrated down.  Delirium Maintain delirium precautions Appears to be on lorazepam chronically-we will increase p.m. dose to 1 mg.   Check twelve-lead EKG If QTc allows-can always add Seroquel.  Depression/anxiety Effexor being titrated  down-as switched to Remeron to see if that will help with  appetite/weight gain. Continue usual dosing of lorazepam-but will increase p.m. dose to 1 mg  Tobacco abuse Counseled  Debility/deconditioning Due to acute illness PT/OT eval Home health services recommended  Palliative care DNR in place Gentle medical treatment Appreciate palliative care input-disposition is SNF with palliative follow-up.  Nutrition Status: Nutrition Problem: Severe Malnutrition Etiology: chronic illness (dysphagia, COPD) Signs/Symptoms: severe muscle depletion, severe fat depletion, percent weight loss (21.8% weight loss in 6 months) Percent weight loss: 21.8 % Interventions: Ensure Enlive (each supplement provides 350kcal and 20 grams of protein), MVI, Magic cup, Education  Underweight: Estimated body mass index is 15.8 kg/m as calculated from the following:   Height as of this encounter: 5' (1.524 m).   Weight as of this encounter: 36.7 kg.   Code status:   Code Status: DNR   DVT Prophylaxis: enoxaparin (LOVENOX) injection 30 mg Start: 09/07/22 1215 SCDs Start: 09/06/22 0618   Family Communication: None at bedside-   Disposition Plan: Status is: Inpatient Remains inpatient appropriate because: New diagnosis of HFrEF-ongoing titration of medications-not yet stable for discharge.   Planned Discharge Destination: SNF-epic message sent to [facility] team.   Diet: Diet Order             DIET DYS 2 Room service appropriate? Yes; Fluid consistency: Thin  Diet effective now                     Antimicrobial agents: Anti-infectives (From admission, onward)    None        MEDICATIONS: Scheduled Meds:  aspirin  81 mg Oral Daily   busPIRone  5 mg Oral BID   Chlorhexidine Gluconate Cloth  6 each Topical Daily   dapagliflozin propanediol  10 mg Oral Daily   enoxaparin (LOVENOX) injection  30 mg Subcutaneous Q24H   feeding supplement  237 mL Oral BID BM   fluticasone furoate-vilanterol  1 puff Inhalation Daily   And   umeclidinium bromide   1 puff Inhalation Daily   furosemide  20 mg Intravenous Daily   ivabradine  5 mg Oral BID WC   LORazepam  0.5 mg Oral TID   losartan  25 mg Oral QPM   melatonin  3 mg Oral QHS   metoprolol succinate  50 mg Oral q AM   mirtazapine  7.5 mg Oral QHS   Followed by   Derrill Memo ON 09/14/2022] mirtazapine  15 mg Oral QHS   multivitamin with minerals  1 tablet Oral Daily   mupirocin ointment  1 Application Nasal BID   nicotine  21 mg Transdermal Daily   mouth rinse  15 mL Mouth Rinse 4 times per day   pantoprazole  40 mg Oral Daily   raloxifene  60 mg Oral Daily   simvastatin  20 mg Oral QHS   spironolactone  12.5 mg Oral Daily   [START ON 09/11/2022] venlafaxine XR  75 mg Oral Q breakfast   Followed by   Derrill Memo ON 09/15/2022] venlafaxine XR  37.5 mg Oral Q breakfast   Continuous Infusions:  sodium chloride 20 mL/hr at 09/06/22 0938   PRN Meds:.acetaminophen, haloperidol lactate, ipratropium-albuterol, mouth rinse, sodium chloride   I have personally reviewed following labs and imaging studies  LABORATORY DATA: CBC: Recent Labs  Lab 09/06/22 0104 09/07/22 0143 09/08/22 0619  WBC 8.7 7.5 8.3  NEUTROABS 6.2  --  6.0  HGB 11.1* 11.3* 12.5  HCT 32.3* 33.4* 37.4  MCV 100.9* 101.5* 100.8*  PLT 193 173 210     Basic Metabolic Panel: Recent Labs  Lab 09/06/22 0104 09/07/22 0143 09/07/22 1652 09/08/22 0619 09/08/22 2009 09/09/22 0545 09/10/22 0222  NA 133* 140  --  134*  --  137 136  K 3.3* 3.9  --  3.2*  --  3.6 3.5  CL 94* 101  --  94*  --  95* 93*  CO2 27 31  --  34*  --  32 29  GLUCOSE 178* 95  --  102*  --  85 73  BUN 10 8  --  <5*  --  8 14  CREATININE 1.04* 0.61  --  0.59  --  0.64 0.60  CALCIUM 9.0 8.7*  --  8.6*  --  9.2 9.2  MG  --  1.4* 2.2 1.9 1.8 1.9  --   PHOS  --  4.2 3.8 3.6 3.5 4.1  --      GFR: Estimated Creatinine Clearance: 30.9 mL/min (by C-G formula based on SCr of 0.6 mg/dL).  Liver Function Tests: Recent Labs  Lab 09/06/22 0104  09/08/22 0619  AST 14* 19  ALT 14 15  ALKPHOS 34* 42  BILITOT 0.7 0.4  PROT 5.0* 5.6*  ALBUMIN 3.0* 2.9*    No results for input(s): "LIPASE", "AMYLASE" in the last 168 hours. Recent Labs  Lab 09/06/22 0822  AMMONIA <10     Coagulation Profile: Recent Labs  Lab 09/06/22 0822  INR 1.2     Cardiac Enzymes: No results for input(s): "CKTOTAL", "CKMB", "CKMBINDEX", "TROPONINI" in the last 168 hours.  BNP (last 3 results) No results for input(s): "PROBNP" in the last 8760 hours.  Lipid Profile: Recent Labs    09/08/22 0619  CHOL 109  HDL 44  LDLCALC 55  TRIG 49  CHOLHDL 2.5  LDLDIRECT 54     Thyroid Function Tests: No results for input(s): "TSH", "T4TOTAL", "FREET4", "T3FREE", "THYROIDAB" in the last 72 hours.  Anemia Panel: No results for input(s): "VITAMINB12", "FOLATE", "FERRITIN", "TIBC", "IRON", "RETICCTPCT" in the last 72 hours.  Urine analysis:    Component Value Date/Time   COLORURINE YELLOW 09/06/2022 0248   APPEARANCEUR HAZY (A) 09/06/2022 0248   LABSPEC 1.009 09/06/2022 0248   PHURINE 7.0 09/06/2022 0248   GLUCOSEU NEGATIVE 09/06/2022 0248   HGBUR NEGATIVE 09/06/2022 0248   BILIRUBINUR NEGATIVE 09/06/2022 0248   BILIRUBINUR neg 07/05/2013 1027   KETONESUR NEGATIVE 09/06/2022 0248   PROTEINUR 30 (A) 09/06/2022 0248   UROBILINOGEN 0.2 09/03/2015 1220   NITRITE NEGATIVE 09/06/2022 0248   LEUKOCYTESUR NEGATIVE 09/06/2022 0248    Sepsis Labs: Lactic Acid, Venous    Component Value Date/Time   LATICACIDVEN 1.6 09/06/2022 0314    MICROBIOLOGY: Recent Results (from the past 240 hour(s))  SARS Coronavirus 2 by RT PCR (hospital order, performed in [facility] hospital lab) *cepheid single result test* Anterior Nasal Swab     Status: None   Collection Time: 09/06/22  7:28 AM   Specimen: Anterior Nasal Swab  Result Value Ref Range Status   SARS Coronavirus 2 by RT PCR NEGATIVE NEGATIVE Final    Comment: (NOTE) SARS-CoV-2 target nucleic  acids are NOT DETECTED.  The SARS-CoV-2 RNA is generally detectable in upper and lower respiratory specimens during the acute phase of infection. The lowest concentration of SARS-CoV-2 viral copies this assay can detect is 250 copies / mL. A negative result does not preclude SARS-CoV-2 infection and should not be used as  the sole basis for treatment or other patient management decisions.  A negative result may occur with improper specimen collection / handling, submission of specimen other than nasopharyngeal swab, presence of viral mutation(s) within the areas targeted by this assay, and inadequate number of viral copies (<250 copies / mL). A negative result must be combined with clinical observations, patient history, and epidemiological information.  Fact Sheet for Patients:   https://www.patel.info/  Fact Sheet for Healthcare Providers: https://hall.com/  This test is not yet approved or  cleared by the [place] FDA and has been authorized for detection and/or diagnosis of SARS-CoV-2 by FDA under an Emergency Use Authorization (EUA).  This EUA will remain in effect (meaning this test can be used) for the duration of the COVID-19 declaration under Section 564(b)(1) of the Act, 21 U.S.C. section 360bbb-3(b)(1), unless the authorization is terminated or revoked sooner.  Performed at [facility], [place] [address], [place], [place] [address]   MRSA Next Gen by PCR, Nasal     Status: Abnormal   Collection Time: 09/06/22  5:40 PM   Specimen: Nasal Mucosa; Nasal Swab  Result Value Ref Range Status   MRSA by PCR Next Gen DETECTED (A) NOT DETECTED Final    Comment: RESULT CALLED TO, READ BACK BY AND VERIFIED WITH: RN Seabrook Cellar [phone] '@[phone]'$  FH (NOTE) The GeneXpert MRSA Assay (FDA approved for NASAL specimens only), is one component of a comprehensive MRSA colonization surveillance program. It is not intended to diagnose MRSA  infection nor to guide or monitor treatment for MRSA infections. Test performance is not FDA approved in patients less than 66 years old. Performed at [facility], [place] [address], [place], [place] [address]     RADIOLOGY STUDIES/RESULTS: VAS [place] LOWER EXTREMITY VENOUS (DVT)  Result Date: 09/08/2022  Lower Venous DVT Study Patient Name:  Carol [name]  Date of Exam:   09/08/2022 Medical Rec #: [id number]          Accession #:    [id number] Date of Birth: 1936-06-17         Patient Gender: F Patient Age:   32 years Exam Location:  [facility] Procedure:      VAS [place] LOWER EXTREMITY VENOUS (DVT) Referring Phys: Collier Bullock --------------------------------------------------------------------------------  Indications: "Unresponsive episode".  Limitations: Body habitus (BMI 16.92). Positioning in bed with knees bent secondary to back pain. Comparison Study: No prior study on file Performing Technologist: Sharion Dove RVS  Examination Guidelines: A complete evaluation includes B-mode imaging, spectral Doppler, color Doppler, and power Doppler as needed of all accessible portions of each vessel. Bilateral testing is considered an integral part of a complete examination. Limited examinations for reoccurring indications may be performed as noted. The reflux portion of the exam is performed with the patient in reverse Trendelenburg.  +---------+---------------+---------+-----------+----------+---------------+ RIGHT    CompressibilityPhasicitySpontaneityPropertiesThrombus Aging  +---------+---------------+---------+-----------+----------+---------------+ CFV      Full           Yes      Yes                                  +---------+---------------+---------+-----------+----------+---------------+ SFJ      Full                                                         +---------+---------------+---------+-----------+----------+---------------+  FV Prox  Full                                                          +---------+---------------+---------+-----------+----------+---------------+ FV Mid   Full                                                         +---------+---------------+---------+-----------+----------+---------------+ FV DistalFull           Yes      Yes                                  +---------+---------------+---------+-----------+----------+---------------+ PFV      Full                                                         +---------+---------------+---------+-----------+----------+---------------+ POP      Full           Yes      Yes                                  +---------+---------------+---------+-----------+----------+---------------+ PTV                                                   patent by color +---------+---------------+---------+-----------+----------+---------------+ PERO                                                  patent by color +---------+---------------+---------+-----------+----------+---------------+   +---------+---------------+---------+-----------+----------+--------------+ LEFT     CompressibilityPhasicitySpontaneityPropertiesThrombus Aging +---------+---------------+---------+-----------+----------+--------------+ CFV      Full           Yes      Yes                                 +---------+---------------+---------+-----------+----------+--------------+ SFJ      Full                                                        +---------+---------------+---------+-----------+----------+--------------+ FV Prox  Full                                                        +---------+---------------+---------+-----------+----------+--------------+ FV  Mid   Full                                                        +---------+---------------+---------+-----------+----------+--------------+ FV DistalFull                                                         +---------+---------------+---------+-----------+----------+--------------+ PFV      Full                                                        +---------+---------------+---------+-----------+----------+--------------+ POP      Full           Yes      Yes                                 +---------+---------------+---------+-----------+----------+--------------+ PTV      Full                                                        +---------+---------------+---------+-----------+----------+--------------+ PERO     Full                                                        +---------+---------------+---------+-----------+----------+--------------+     Summary: BILATERAL: - No evidence of deep vein thrombosis seen in the lower extremities, bilaterally. -No evidence of popliteal cyst, bilaterally.   *See table(s) above for measurements and observations. Electronically signed by Monica Martinez MD on 09/08/2022 at 6:59:03 PM.    Final      LOS: 4 days   Oren Binet, MD  [facility]    To contact the attending provider between 7A-7P or the covering provider during after hours 7P-7A, please log into the web site [email or web address] and access using universal [place] password for that web site. If you do not have the password, please call the hospital operator.  09/10/2022, 12:09 PM

## 2022-09-10 NOTE — NC FL2 (Addendum)
[place] LEVEL OF CARE SCREENING TOOL     IDENTIFICATION  Patient Name: Carol [name] Birthdate: [birth year] Sex: female Admission Date (Current Location): 09/06/2022  [facility] and [place] Number:  Herbalist and Address:  [facility] [place], [place] [address], [place], [place] [address]      Provider Number: [id number]  Attending Physician Name and Address:  Jonetta Osgood, MD  Relative Name and Phone Number:  Garry Heater (Daughter)   [phone]    Current Level of Care: Hospital Recommended Level of Care: [place] Prior Approval Number:    Date Approved/Denied:   PASRR Number: [id number]  Discharge Plan: SNF    Current Diagnoses: Patient Active Problem List   Diagnosis Date Noted   Elevated troponin level not due to acute coronary syndrome 09/09/2022   Hypertensive heart disease with acute combined systolic and diastolic congestive heart failure ([place]) 09/09/2022   Protein-calorie malnutrition, severe 09/08/2022   Cardiomyopathy ([place])    HFrEF (heart failure with reduced ejection fraction) (HCC)    Mixed hyperlipidemia    Cigarette smoker    Atherosclerosis of aorta (HCC)    Coronary atherosclerosis due to calcified coronary lesion    AMS (altered mental status) 09/06/2022   Elevated lactic acid level    Elevated troponin    Hypotension    Metabolic encephalopathy    Goals of care, counseling/discussion    Chronic respiratory failure with hypoxia ([place]) 09/03/2022   Weight loss 09/03/2022   Protein-calorie malnutrition ([place]) 03/20/2022   Hyponatremia 03/17/2022   Abnormal CT of the chest 03/17/2022   COPD exacerbation ([place]) 03/16/2022   COPD (chronic obstructive pulmonary disease) ([place]): Probable 04/27/2019   Low vitamin B12 level 04/27/2019   Gastrointestinal hemorrhage    Hematochezia 04/24/2019   Tobacco abuse 04/24/2019   Anxiety 04/24/2019   Osteoporosis 04/24/2019   Benign  hypertension 04/24/2019   Cystitis 04/24/2019   Tinea cruris 04/24/2019   Colitis 01/04/2018   Rectal bleeding 01/04/2018   GERD (gastroesophageal reflux disease) 01/04/2018    Orientation RESPIRATION BLADDER Height & Weight     Self  O2 External catheter, Incontinent Weight: 80 lb 14.5 oz (36.7 kg) Height:  5' (152.4 cm)  BEHAVIORAL SYMPTOMS/MOOD NEUROLOGICAL BOWEL NUTRITION STATUS      Continent Diet (See DC Summary)  AMBULATORY STATUS COMMUNICATION OF NEEDS Skin   Limited Assist Verbally                         Personal Care Assistance Level of Assistance  Feeding, Bathing, Dressing Bathing Assistance: Limited assistance Feeding assistance: Independent Dressing Assistance: Limited assistance     Functional Limitations Info  Sight, Hearing, Speech Sight Info: Adequate Hearing Info: Adequate Speech Info: Adequate    SPECIAL CARE FACTORS FREQUENCY  PT (By licensed PT), OT (By licensed OT)     PT Frequency: 5x a week OT Frequency: 5x a week            Contractures Contractures Info: Not present    Additional Factors Info  Code Status, Allergies Code Status Info: DNR Allergies Info: Ciprofloxacin   Nitrofuran Derivatives   Paxil (Paroxetine)   Penicillins   Sulfa Antibiotics           Current Medications (09/10/2022):  This is the current hospital active medication list Current Facility-Administered Medications  Medication Dose Route Frequency Provider Last Rate Last Admin   0.9 %  sodium chloride infusion  250  mL Intravenous Continuous Candee Furbish, MD 20 mL/hr at 09/06/22 5009 Restarted at 09/06/22 0938   acetaminophen (TYLENOL) tablet 650 mg  650 mg Oral Q6H PRN Jonetta Osgood, MD   650 mg at 09/09/22 3818   aspirin chewable tablet 81 mg  81 mg Oral Daily Tolia, Sunit, DO   81 mg at 09/10/22 1041   busPIRone (BUSPAR) tablet 5 mg  5 mg Oral BID Leigh Aurora, DO   5 mg at 09/10/22 1042   Chlorhexidine Gluconate Cloth 2 % PADS 6 each  6 each  Topical Daily Candee Furbish, MD   6 each at 09/10/22 1048   dapagliflozin propanediol (FARXIGA) tablet 10 mg  10 mg Oral Daily Tolia, Sunit, DO   10 mg at 09/10/22 1041   enoxaparin (LOVENOX) injection 30 mg  30 mg Subcutaneous Q24H Leigh Aurora, DO   30 mg at 09/10/22 1401   feeding supplement (ENSURE ENLIVE / ENSURE PLUS) liquid 237 mL  237 mL Oral BID BM Candee Furbish, MD   237 mL at 09/10/22 1402   fluticasone furoate-vilanterol (BREO ELLIPTA) 100-25 MCG/ACT 1 puff  1 puff Inhalation Daily Leigh Aurora, DO   1 puff at 09/09/22 1012   And   umeclidinium bromide (INCRUSE ELLIPTA) 62.5 MCG/ACT 1 puff  1 puff Inhalation Daily Leigh Aurora, DO   1 puff at 09/09/22 1012   furosemide (LASIX) injection 20 mg  20 mg Intravenous Daily Tolia, Sunit, DO   20 mg at 09/10/22 1042   haloperidol lactate (HALDOL) injection 2 mg  2 mg Intravenous Q6H PRN Candee Furbish, MD   2 mg at 09/09/22 1750   ipratropium-albuterol (DUONEB) 0.5-2.5 (3) MG/3ML nebulizer solution 3 mL  3 mL Nebulization Q6H PRN Collier Bullock, MD       ivabradine (CORLANOR) tablet 5 mg  5 mg Oral BID WC Tolia, Sunit, DO   5 mg at 09/10/22 1048   LORazepam (ATIVAN) tablet 0.5 mg  0.5 mg Oral BID Jonetta Osgood, MD   0.5 mg at 09/10/22 1400   LORazepam (ATIVAN) tablet 1 mg  1 mg Oral Q2000 Ghimire, Henreitta Leber, MD       losartan (COZAAR) tablet 25 mg  25 mg Oral QPM Tolia, Sunit, DO       melatonin tablet 3 mg  3 mg Oral QHS Ghimire, Henreitta Leber, MD       metoprolol succinate (TOPROL-XL) 24 hr tablet 50 mg  50 mg Oral q AM Tolia, Sunit, DO   50 mg at 09/10/22 1048   mirtazapine (REMERON SOL-TAB) disintegrating tablet 7.5 mg  7.5 mg Oral QHS Reome, Earle J, RPH   7.5 mg at 09/09/22 2251   Followed by   Derrill Memo ON 09/14/2022] mirtazapine (REMERON) tablet 15 mg  15 mg Oral QHS Reome, Earle J, RPH       multivitamin with minerals tablet 1 tablet  1 tablet Oral Daily Candee Furbish, MD   1 tablet at 09/10/22 1041   mupirocin ointment  (BACTROBAN) 2 % 1 Application  1 Application Nasal BID Collier Bullock, MD   1 Application at 29/93/71 1042   nicotine (NICODERM CQ - dosed in mg/24 hours) patch 21 mg  21 mg Transdermal Daily Candee Furbish, MD   21 mg at 09/10/22 1041   Oral care mouth rinse  15 mL Mouth Rinse 4 times per day Jacky Kindle, MD   15 mL at 09/10/22 1550   Oral care mouth rinse  15 mL Mouth Rinse PRN Jacky Kindle, MD       pantoprazole (PROTONIX) EC tablet 40 mg  40 mg Oral Daily Reome, Earle J, RPH   40 mg at 09/10/22 1041   raloxifene (EVISTA) tablet 60 mg  60 mg Oral Daily Leigh Aurora, DO   60 mg at 09/10/22 1041   simvastatin (ZOCOR) tablet 20 mg  20 mg Oral QHS Leigh Aurora, DO   20 mg at 09/09/22 2252   sodium chloride (OCEAN) 0.65 % nasal spray 1 spray  1 spray Each Nare PRN Jonetta Osgood, MD       spironolactone (ALDACTONE) tablet 12.5 mg  12.5 mg Oral Daily Tolia, Sunit, DO   12.5 mg at 09/10/22 1041   [START ON 09/11/2022] venlafaxine XR (EFFEXOR-XR) 24 hr capsule 75 mg  75 mg Oral Q breakfast Reome, Earle J, RPH       Followed by   Derrill Memo ON 09/15/2022] venlafaxine XR (EFFEXOR-XR) 24 hr capsule 37.5 mg  37.5 mg Oral Q breakfast Reome, Earle J, RPH         Discharge Medications: Please see discharge summary for a list of discharge medications.  Relevant Imaging Results:  Relevant Lab Results:   Additional Information SSN: [id number]  Reece Agar, LCSWA

## 2022-09-10 NOTE — TOC Initial Note (Signed)
Transition of Care [facility]) - Initial/Assessment Note    Patient Details  Name: Carol [name] MRN: [id number] Date of Birth: Jun 17, 1936  Transition [facility]) CM/SW Contact:    Tresa Endo Phone Number: 09/10/2022, 3:22 PM  Clinical Narrative:                 Pt is only oriented to person, CSW attempted to contact her daughter to discuss DC plan. No answer, CSW left a VM for a call back.  Pt daughter Carol [name] contacted CSW back and is agreeable to SNF. Carol [name] stated that they have been in contacted with palliative and have been updated on the inpatient rehab at a SNF. PT has updated therapy consult to SNF after visit with the pt today. Carol [name] gave CSW permission to fax pt out. CSW will follow up with family once facilities offer.         Patient Goals and CMS Choice        Expected Discharge Plan and Services                                                Prior Living Arrangements/Services                       Activities of Daily Living      Permission Sought/Granted                  Emotional Assessment              Admission diagnosis:  Metabolic encephalopathy [V77.93] Hypokalemia [E87.6] Hyponatremia [E87.1] Elevated troponin [R79.89] Elevated lactic acid level [R79.89] Macrocytic anemia [D53.9] Hypotension, unspecified hypotension type [I95.9] AMS (altered mental status) [R41.82] Patient Active Problem List   Diagnosis Date Noted   Elevated troponin level not due to acute coronary syndrome 09/09/2022   Hypertensive heart disease with acute combined systolic and diastolic congestive heart failure ([place]) 09/09/2022   Protein-calorie malnutrition, severe 09/08/2022   Cardiomyopathy ([place])    HFrEF (heart failure with reduced ejection fraction) ([place])    Mixed hyperlipidemia    Cigarette smoker    Atherosclerosis of aorta (H[facility])    Coronary atherosclerosis due to calcified coronary lesion    AMS (altered mental  status) 09/06/2022   Elevated lactic acid level    Elevated troponin    Hypotension    Metabolic encephalopathy    Goals of care, counseling/discussion    Chronic respiratory failure with hypoxia ([place]) 09/03/2022   Weight loss 09/03/2022   Protein-calorie malnutrition ([place]) 03/20/2022   Hyponatremia 03/17/2022   Abnormal CT of the chest 03/17/2022   COPD exacerbation ([place]) 03/16/2022   COPD (chronic obstructive pulmonary disease) ([place]): Probable 04/27/2019   Low vitamin B12 level 04/27/2019   Gastrointestinal hemorrhage    Hematochezia 04/24/2019   Tobacco abuse 04/24/2019   Anxiety 04/24/2019   Osteoporosis 04/24/2019   Benign hypertension 04/24/2019   Cystitis 04/24/2019   Tinea cruris 04/24/2019   Colitis 01/04/2018   Rectal bleeding 01/04/2018   GERD (gastroesophageal reflux disease) 01/04/2018   PCP:  Shirline Frees, MD Pharmacy:   [id number], [address] [place] [address] Phone: [phone] Fax: [phone]  [facility]  [address] [place] [address] Phone: [phone] Fax: [phone]     Social Determinants of Health (SDOH) Interventions    Readmission Risk Interventions     No data to display

## 2022-09-10 NOTE — Progress Notes (Signed)
Physical Therapy Treatment Patient Details Name: [place] A Wilhite MRN: [id number] DOB: [birth year] Today's Date: 09/10/2022   History of Present Illness 86 year old female who presents after being found unresponsive at home overnight in morning of 11/12. Daughter performed CPR briefly. Unclear if she truly lost pulses. She did have spontaneous circulation when EMS arrived. Pt with pna in April of this year and since then has grown progressively weak, has struggled with protein calorie malnutrition, and has about a 20 pound unintentional weight loss. PMH of COPD, HTN, hyponatremia, and recent admission for pneumonia in April of this year, Anxiety, Arthritis, Colitis, Complication of anesthesia, GERD, Headache, Hypertension, and Urinary tract infection (early aug 2014).    PT Comments    Pt pleasant and cooperative; requires cues for problem solving and execution of mobility tasks. Pt requiring min guard-min assist for functional mobility. Pt able to participate in seated warm up exercises and then ambulating 70 ft with a rolling walker; HR 70's-100. Pt displays generalized weakness, impaired cognition, decreased standing balance, and decreased cardiopulmonary endurance. In light of this and lack of caregiver support, recommend ST SNF at d/c.    Recommendations for follow up therapy are one component of a multi-disciplinary discharge planning process, led by the attending physician.  Recommendations may be updated based on patient status, additional functional criteria and insurance authorization.  Follow Up Recommendations  Skilled nursing-short term rehab (<3 hours/day) Can patient physically be transported by private vehicle: Yes   Assistance Recommended at Discharge Frequent or constant Supervision/Assistance  Patient can return home with the following A little help with walking and/or transfers;A little help with bathing/dressing/bathroom;Assistance with cooking/housework;Assist for  transportation;Help with stairs or ramp for entrance   Equipment Recommendations  None recommended by PT    Recommendations for Other Services       Precautions / Restrictions Precautions Precautions: Fall Restrictions Weight Bearing Restrictions: No     Mobility  Bed Mobility Overal bed mobility: Needs Assistance Bed Mobility: Supine to Sit, Sit to Supine     Supine to sit: Supervision Sit to supine: Min assist   General bed mobility comments: Step by step cues for initiation/sequencing. MinA for LE assist back into bed    Transfers Overall transfer level: Needs assistance Equipment used: Rolling walker (2 wheels) Transfers: Sit to/from Stand Sit to Stand: Min guard                Ambulation/Gait Ambulation/Gait assistance: Herbalist (Feet): 70 Feet Assistive device: Rolling walker (2 wheels) Gait Pattern/deviations: Step-through pattern, Decreased stride length, Trunk flexed, Narrow base of support Gait velocity: decreased Gait velocity interpretation: <1.8 ft/sec, indicate of risk for recurrent falls   General Gait Details: MinA for balance throughout, cues for wider BOS   Stairs             Wheelchair Mobility    Modified Rankin (Stroke Patients Only)       Balance Overall balance assessment: Needs assistance Sitting-balance support: No upper extremity supported, Feet supported Sitting balance-Leahy Scale: Fair     Standing balance support: Bilateral upper extremity supported, During functional activity Standing balance-Leahy Scale: Poor                              Cognition Arousal/Alertness: Awake/alert Behavior During Therapy: WFL for tasks assessed/performed Overall Cognitive Status: Impaired/Different from baseline Area of Impairment: Problem solving, Memory  Memory: Decreased short-term memory       Problem Solving: Requires verbal cues, Requires tactile  cues General Comments: Pt pleasant and cooperative; hx sundowning during inpatient stay        Exercises General Exercises - Upper Extremity Shoulder Flexion: Both, 10 reps, Seated General Exercises - Lower Extremity Long Arc Quad: Both, 10 reps, Seated Hip Flexion/Marching: Both, 10 reps, Seated    General Comments        Pertinent Vitals/Pain Pain Assessment Pain Assessment: No/denies pain    Home Living                          Prior Function            PT Goals (current goals can now be found in the care plan section) Acute Rehab PT Goals Patient Stated Goal: to go home Potential to Achieve Goals: Good Progress towards PT goals: Progressing toward goals    Frequency    Min 2X/week      PT Plan Discharge plan needs to be updated;Frequency needs to be updated    Co-evaluation              AM-PAC PT "6 Clicks" Mobility   Outcome Measure  Help needed turning from your back to your side while in a flat bed without using bedrails?: A Little Help needed moving from lying on your back to sitting on the side of a flat bed without using bedrails?: A Little Help needed moving to and from a bed to a chair (including a wheelchair)?: A Little Help needed standing up from a chair using your arms (e.g., wheelchair or bedside chair)?: A Little Help needed to walk in hospital room?: A Little Help needed climbing 3-5 steps with a railing? : A Lot 6 Click Score: 17    End of Session Equipment Utilized During Treatment: Gait belt;Oxygen Activity Tolerance: Patient tolerated treatment well Patient left: in bed;with call bell/phone within reach;with bed alarm set;with nursing/sitter in room;with SCD's reapplied Nurse Communication: Mobility status PT Visit Diagnosis: Muscle weakness (generalized) (M62.81)     Time: 2952-8413 PT Time Calculation (min) (ACUTE ONLY): 17 min  Charges:  $Gait Training: 8-22 mins                     Wyona Almas, PT,  DPT Acute Rehabilitation Services Office [phone]    Deno Etienne 09/10/2022, 12:52 PM

## 2022-09-10 NOTE — Progress Notes (Signed)
Daily Progress Note   Patient Name: Carol [name]       Date: 09/10/2022 DOB: 07-13-1936  Age: 86 y.o. MRN#: [id number] Attending Physician: Jonetta Osgood, MD Primary Care Physician: Shirline Frees, MD Admit Date: 09/06/2022  Reason for Consultation/Follow-up: Establishing goals of care  Subjective: Medical records reviewed including progress notes, labs, imaging. Patient assessed at the bedside.  She is resting comfortably, denies pain or distress.  Her granddaughter Loma Sousa is at the bedside visiting, as well as Courtney's boyfriend.  Created space and opportunity for patient and family's thoughts and feelings after yesterday's goals of care conversation.  They remain motivated and hopeful for improvement after trial of SNF for rehab with outpatient palliative care follow-up.  I then called patient's daughter Kennyth Lose to provide support and inquire about readiness for MOST form completion.  I was unable to reach and left a voicemail with PMT contact information.  Questions and concerns addressed. PMT will continue to support holistically.   Length of Stay: 4  Physical Exam Vitals and nursing note reviewed.  Constitutional:      General: She is not in acute distress.    Interventions: Nasal cannula in place.  Cardiovascular:     Rate and Rhythm: Normal rate.  Pulmonary:     Effort: Pulmonary effort is normal.  Skin:    General: Skin is warm and dry.  Neurological:     Mental Status: She is alert.  Psychiatric:        Mood and Affect: Mood normal.        Behavior: Behavior normal. Behavior is cooperative.            Vital Signs: BP 127/65 (BP Location: Right Arm)   Pulse 70   Temp 97.8 F (36.6 C) (Oral)   Resp 17   Ht 5' (1.524 m)   Wt 36.7 kg   SpO2 100%    BMI 15.80 kg/m  SpO2: SpO2: 100 % O2 Device: O2 Device: Nasal Cannula O2 Flow Rate: O2 Flow Rate (L/min): 4 L/min      Palliative Assessment/Data: 40 to 50%   Palliative Care Assessment & Plan   Patient Profile: 86 y.o. female  with past medical history of COPD, depression/anxiety, hyponatremia, HTN admitted on 09/06/2022 with unresponsiveness.    Patient admitted with shock from hypovolemia vs  medication from benzodiazepines and acute on chronic hypoxemic respiratory failure. PMT has been consulted to assist with goals of care conversation.  Assessment: Goals of care conversation New diagnosis of HFrEF Acute toxic metabolic encephalopathy, improved Syncopal episode AKI, resolved COPD Failure to thrive/debility and deconditioning  Recommendations/Plan: Continue DNR Continue current care Goal remains SNF with outpatient palliative care Psychosocial and emotional support provided PMT will continue to follow and support   Prognosis: Guarded to poor, failure to thrive with chronic debility and several chronic comorbidities  Discharge Planning: [place] for rehab with Palliative care service follow-up  Care plan was discussed with patient, patient's granddaughter   Total time: I spent 35 minutes in the care of the patient today in the above activities and documenting the encounter.   Dorthy Cooler, PA-C Palliative Medicine Team Team phone # [phone]  Thank you for allowing the Palliative Medicine Team to assist in the care of this patient. Please utilize secure chat with additional questions, if there is no response within 30 minutes please call the above phone number.  Palliative Medicine Team providers are available by phone from 7am to 7pm daily and can be reached through the team cell phone.  Should this patient require assistance outside of these hours, please call the patient's attending physician.

## 2022-09-11 ENCOUNTER — Other Ambulatory Visit (HOSPITAL_COMMUNITY): Payer: Medicare Other

## 2022-09-11 ENCOUNTER — Inpatient Hospital Stay (HOSPITAL_COMMUNITY): Payer: Medicare Other

## 2022-09-11 DIAGNOSIS — I5020 Unspecified systolic (congestive) heart failure: Secondary | ICD-10-CM | POA: Diagnosis not present

## 2022-09-11 DIAGNOSIS — I2510 Atherosclerotic heart disease of native coronary artery without angina pectoris: Secondary | ICD-10-CM | POA: Diagnosis not present

## 2022-09-11 DIAGNOSIS — I700 Atherosclerosis of aorta: Secondary | ICD-10-CM | POA: Diagnosis not present

## 2022-09-11 DIAGNOSIS — I429 Cardiomyopathy, unspecified: Secondary | ICD-10-CM | POA: Diagnosis not present

## 2022-09-11 NOTE — Progress Notes (Addendum)
PROGRESS NOTE        PATIENT DETAILS Name: Carol [name] Age: 86 y.o. Sex: female Date of Birth: [birth year] Admit Date: 09/06/2022 Admitting Physician Collier Bullock, MD IRC:[id number], Gwyndolyn Saxon, MD  Brief Summary: Patient is a 86 y.o.  female with history of COPD-on home O2 prn, HTN-who presented with a episode of unresponsiveness-family apparently did CPR at home, when EMS arrived she had already achieved spontaneous circulation.  She was hypotensive when she arrived in the ED, she was started on Levophed and subsequently admitted to the ICU by PCCM-upon further evaluation-she was found to have new diagnosis of HFrEF.  Significant events: 10/12>> episode of unresponsiveness-syncope at home-hypotensive in the ED-admit to ICU. 10/14>> transfer to [facility]  Significant studies: 10/12>> CXR: No PNA 10/12>> CT head: No acute intracranial abnormalities. 10/12>> Echo: EF 20-25% 10/14>> bilateral lower extremity Doppler: Negative  Significant microbiology data: 10/12>> COVID PCR: Negative  Procedures: None  Consults: PCCM Cardiology Palliative  Subjective: No major issues overnight-some minimal delirium.  Objective: Vitals: Blood pressure (!) 119/55, pulse 76, temperature (!) 97.5 F (36.4 C), temperature source Oral, resp. rate 18, height 5' (1.524 m), weight 42.2 kg, SpO2 97 %.   Exam: Gen Exam:Alert awake-not in any distress HEENT:atraumatic, normocephalic Chest: B/L clear to auscultation anteriorly CVS:S1S2 regular Abdomen:soft non tender, non distended Extremities:no edema Neurology: Non focal Skin: no rash   Pertinent Labs/Radiology:    Latest Ref Rng & Units 09/08/2022    6:19 AM 09/07/2022    1:43 AM 09/06/2022    1:04 AM  CBC  WBC 4.0 - 10.5 K/uL 8.3  7.5  8.7   Hemoglobin 12.0 - 15.0 g/dL 12.5  11.3  11.1   Hematocrit 36.0 - 46.0 % 37.4  33.4  32.3   Platelets 150 - 400 K/uL 210  173  193     Lab Results  Component Value Date    NA 136 09/10/2022   K 3.5 09/10/2022   CL 93 (L) 09/10/2022   CO2 29 09/10/2022      Assessment/Plan: Syncopal episode Unclear exactly what happened at home-when she was unresponsive-suspect may have been a syncopal episode. Echo as above  Acute toxic metabolic encephalopathy Multifactorial from Ativan use and from hypoperfusion in the setting of hypotension/cardiogenic shock Although overall improved-had delirium overnight-pleasantly confused this morning Maintain delirium precautions.  Multifactorial shock-hypovolemia/side effect of medications/cardiogenic Shock physiology resolved-BP stable.  New diagnosis of HFrEF Euvolemic Cardiology gradually titrating medications-losartan has been added.  Lasix/: Or/Farxiga/metoprolol/Aldactone already on board. Per cardiology note-family prefers gentle medical treatment and elects to defer invasive studies  Minimally elevated troponin Demand ischemia in the setting of hypotension No anginal symptoms Family prefers gentle medical treatment-no invasive studies  AKI Hemodynamically mediated Resolved  Hypokalemia Repleted.  HTN BP stable Continue Aldactone/Lasix/metoprolol/losartan  HLD Continue statin  COPD Chronic hypoxic respiratory failure on as needed home O2 Stable-not on exacerbation Continue bronchodilators  Dysphagia Some concern for silent aspiration Appreciate SLP eval-on dysphagia 2 diet D/w SLP 10/17-Barium esophagogram was scheduled as outpatient, will attempt today  Addendum: Esophagogram not possible today,radiology will re- attempt tomorrow,keep npo post breakfast  Weight loss/failure to thrive syndrome Recent TSH in June 2023 stable Suspect she may have frailty/COPD related cachexia/failure to thrive syndrome Now on Remeron-effects were being titrated down.  Delirium Some mild delirium overnight per nursing staff-did well with adjustment of  lorazepam (on 3 times daily dosing-evening dose  increased to 1 mg) Have asked nursing staff to discontinue sitter this morning Now that QTc has normalized-could potentially use as needed Haldol if needed.  Depression/anxiety Effexor being titrated down-as switched to Remeron to see if that will help with appetite/weight gain. Continue usual dosing of lorazepam-but will increase p.m. dose to 1 mg  Tobacco abuse Counseled  Debility/deconditioning Due to acute illness PT/OT eval SNF planned on discharge.  Palliative care DNR in place Gentle medical treatment Appreciate palliative care input-disposition is SNF with palliative follow-up.  Nutrition Status: Nutrition Problem: Severe Malnutrition Etiology: chronic illness (dysphagia, COPD) Signs/Symptoms: severe muscle depletion, severe fat depletion, percent weight loss (21.8% weight loss in 6 months) Percent weight loss: 21.8 % Interventions: Ensure Enlive (each supplement provides 350kcal and 20 grams of protein), MVI, Magic cup, Education  Underweight: Estimated body mass index is 18.17 kg/m as calculated from the following:   Height as of this encounter: 5' (1.524 m).   Weight as of this encounter: 42.2 kg.   Code status:   Code Status: DNR   DVT Prophylaxis: enoxaparin (LOVENOX) injection 30 mg Start: 09/07/22 1215 SCDs Start: 09/06/22 0618   Family Communication: None at bedside   Disposition Plan: Status is: Inpatient Remains inpatient appropriate because: Discontinue safety sitter today-if no major issues-possible SNF on 10/18.  Planned Discharge Destination: SNF   Diet: Diet Order             DIET DYS 2 Room service appropriate? Yes; Fluid consistency: Thin  Diet effective now                     Antimicrobial agents: Anti-infectives (From admission, onward)    None        MEDICATIONS: Scheduled Meds:  aspirin  81 mg Oral Daily   busPIRone  5 mg Oral BID   Chlorhexidine Gluconate Cloth  6 each Topical Daily   dapagliflozin  propanediol  10 mg Oral Daily   enoxaparin (LOVENOX) injection  30 mg Subcutaneous Q24H   feeding supplement  237 mL Oral BID BM   fluticasone furoate-vilanterol  1 puff Inhalation Daily   And   umeclidinium bromide  1 puff Inhalation Daily   furosemide  20 mg Intravenous Daily   ivabradine  5 mg Oral BID WC   LORazepam  0.5 mg Oral BID   LORazepam  1 mg Oral Q2000   losartan  25 mg Oral QPM   melatonin  3 mg Oral QHS   metoprolol succinate  50 mg Oral q AM   mirtazapine  7.5 mg Oral QHS   Followed by   Derrill Memo ON 09/14/2022] mirtazapine  15 mg Oral QHS   multivitamin with minerals  1 tablet Oral Daily   mupirocin ointment  1 Application Nasal BID   nicotine  21 mg Transdermal Daily   mouth rinse  15 mL Mouth Rinse 4 times per day   pantoprazole  40 mg Oral Daily   raloxifene  60 mg Oral Daily   simvastatin  20 mg Oral QHS   spironolactone  12.5 mg Oral Daily   venlafaxine XR  75 mg Oral Q breakfast   Followed by   Derrill Memo ON 09/15/2022] venlafaxine XR  37.5 mg Oral Q breakfast   Continuous Infusions:  sodium chloride 20 mL/hr at 09/06/22 0938   PRN Meds:.acetaminophen, haloperidol lactate, ipratropium-albuterol, mouth rinse, sodium chloride   I have personally reviewed following labs and imaging studies  LABORATORY DATA: CBC: Recent Labs  Lab 09/06/22 0104 09/07/22 0143 09/08/22 0619  WBC 8.7 7.5 8.3  NEUTROABS 6.2  --  6.0  HGB 11.1* 11.3* 12.5  HCT 32.3* 33.4* 37.4  MCV 100.9* 101.5* 100.8*  PLT 193 173 210     Basic Metabolic Panel: Recent Labs  Lab 09/06/22 0104 09/07/22 0143 09/07/22 1652 09/08/22 0619 09/08/22 2009 09/09/22 0545 09/10/22 0222  NA 133* 140  --  134*  --  137 136  K 3.3* 3.9  --  3.2*  --  3.6 3.5  CL 94* 101  --  94*  --  95* 93*  CO2 27 31  --  34*  --  32 29  GLUCOSE 178* 95  --  102*  --  85 73  BUN 10 8  --  <5*  --  8 14  CREATININE 1.04* 0.61  --  0.59  --  0.64 0.60  CALCIUM 9.0 8.7*  --  8.6*  --  9.2 9.2  MG  --  1.4*  2.2 1.9 1.8 1.9  --   PHOS  --  4.2 3.8 3.6 3.5 4.1  --      GFR: Estimated Creatinine Clearance: 35.5 mL/min (by C-G formula based on SCr of 0.6 mg/dL).  Liver Function Tests: Recent Labs  Lab 09/06/22 0104 09/08/22 0619  AST 14* 19  ALT 14 15  ALKPHOS 34* 42  BILITOT 0.7 0.4  PROT 5.0* 5.6*  ALBUMIN 3.0* 2.9*    No results for input(s): "LIPASE", "AMYLASE" in the last 168 hours. Recent Labs  Lab 09/06/22 0822  AMMONIA <10     Coagulation Profile: Recent Labs  Lab 09/06/22 0822  INR 1.2     Cardiac Enzymes: No results for input(s): "CKTOTAL", "CKMB", "CKMBINDEX", "TROPONINI" in the last 168 hours.  BNP (last 3 results) No results for input(s): "PROBNP" in the last 8760 hours.  Lipid Profile: No results for input(s): "CHOL", "HDL", "LDLCALC", "TRIG", "CHOLHDL", "LDLDIRECT" in the last 72 hours.   Thyroid Function Tests: No results for input(s): "TSH", "T4TOTAL", "FREET4", "T3FREE", "THYROIDAB" in the last 72 hours.  Anemia Panel: No results for input(s): "VITAMINB12", "FOLATE", "FERRITIN", "TIBC", "IRON", "RETICCTPCT" in the last 72 hours.  Urine analysis:    Component Value Date/Time   COLORURINE YELLOW 09/06/2022 0248   APPEARANCEUR HAZY (A) 09/06/2022 0248   LABSPEC 1.009 09/06/2022 0248   PHURINE 7.0 09/06/2022 0248   GLUCOSEU NEGATIVE 09/06/2022 0248   HGBUR NEGATIVE 09/06/2022 0248   BILIRUBINUR NEGATIVE 09/06/2022 0248   BILIRUBINUR neg 07/05/2013 1027   KETONESUR NEGATIVE 09/06/2022 0248   PROTEINUR 30 (A) 09/06/2022 0248   UROBILINOGEN 0.2 09/03/2015 1220   NITRITE NEGATIVE 09/06/2022 0248   LEUKOCYTESUR NEGATIVE 09/06/2022 0248    Sepsis Labs: Lactic Acid, Venous    Component Value Date/Time   LATICACIDVEN 1.6 09/06/2022 0314    MICROBIOLOGY: Recent Results (from the past 240 hour(s))  SARS Coronavirus 2 by RT PCR (hospital order, performed in [facility] hospital lab) *cepheid single result test* Anterior Nasal Swab      Status: None   Collection Time: 09/06/22  7:28 AM   Specimen: Anterior Nasal Swab  Result Value Ref Range Status   SARS Coronavirus 2 by RT PCR NEGATIVE NEGATIVE Final    Comment: (NOTE) SARS-CoV-2 target nucleic acids are NOT DETECTED.  The SARS-CoV-2 RNA is generally detectable in upper and lower respiratory specimens during the acute phase of infection. The lowest concentration of SARS-CoV-2 viral  copies this assay can detect is 250 copies / mL. A negative result does not preclude SARS-CoV-2 infection and should not be used as the sole basis for treatment or other patient management decisions.  A negative result may occur with improper specimen collection / handling, submission of specimen other than nasopharyngeal swab, presence of viral mutation(s) within the areas targeted by this assay, and inadequate number of viral copies (<250 copies / mL). A negative result must be combined with clinical observations, patient history, and epidemiological information.  Fact Sheet for Patients:   https://www.patel.info/  Fact Sheet for Healthcare Providers: https://hall.com/  This test is not yet approved or  cleared by the [place] FDA and has been authorized for detection and/or diagnosis of SARS-CoV-2 by FDA under an Emergency Use Authorization (EUA).  This EUA will remain in effect (meaning this test can be used) for the duration of the COVID-19 declaration under Section 564(b)(1) of the Act, 21 U.S.C. section 360bbb-3(b)(1), unless the authorization is terminated or revoked sooner.  Performed at [facility], [place] [address], [place], [place] [address]   MRSA Next Gen by PCR, Nasal     Status: Abnormal   Collection Time: 09/06/22  5:40 PM   Specimen: Nasal Mucosa; Nasal Swab  Result Value Ref Range Status   MRSA by PCR Next Gen DETECTED (A) NOT DETECTED Final    Comment: RESULT CALLED TO, READ BACK BY AND VERIFIED WITH: RN Yaphank Cellar [phone] '@2016'$  FH (NOTE) The GeneXpert MRSA Assay (FDA approved for NASAL specimens only), is one component of a comprehensive MRSA colonization surveillance program. It is not intended to diagnose MRSA infection nor to guide or monitor treatment for MRSA infections. Test performance is not FDA approved in patients less than 23 years old. Performed at [facility], [place] [address] [place], [place] [address]     RADIOLOGY STUDIES/RESULTS: No results found.   LOS: 5 days   Oren Binet, MD  [facility]    To contact the attending provider between 7A-7P or the covering provider during after hours 7P-7A, please log into the web site [email or web address] and access using universal [place] password for that web site. If you do not have the password, please call the hospital operator.  09/11/2022, 10:10 AM

## 2022-09-11 NOTE — Care Management Important Message (Signed)
Important Message  Patient Details  Name: Carol [name] MRN: [id number] Date of Birth: Dec 30, 1935   Medicare Important Message Given:  Yes     Hannah Beat 09/11/2022, 12:13 PM

## 2022-09-11 NOTE — Progress Notes (Signed)
Mobility Specialist - Progress Note   09/11/22 1458  Mobility  Activity Ambulated with assistance in hallway  Level of Assistance Contact guard assist, steadying assist  Assistive Device Front wheel walker  Distance Ambulated (ft) 70 ft  Activity Response Tolerated well  $Mobility charge 1 Mobility   Pt received in bed agreeable to mobility. Left in bed w/ call bell in reach and all needs met.   Paulla Dolly Mobility Specialist

## 2022-09-11 NOTE — Progress Notes (Signed)
Speech Language Pathology Treatment: Dysphagia  Patient Details Name: Carol [name] MRN: [id number] DOB: 10-Aug-1936 Today's Date: 09/11/2022 Time: 9628-3662 SLP Time Calculation (min) (ACUTE ONLY): 19 min  Assessment / Plan / Recommendation Clinical Impression  Pt seen for ongoing dysphagia management. RN reports no difficulty with PO intake.  Pt took pills whole with water without difficulty.  Family reports good PO intake with meals. Pt is enjoying magic cup.  Directed son to [employer] website for possible home use orders.  Pt tolerated simulated ground consistency and thin liquid by straw with SLP with no clinical s/s of aspiration.  Pt has not yet had esophageal assessment, but was originally planned for OP Barium Swallow on 10/17.  Recommend further esophageal workup while pt is in house if possible.  Recommend continuing ground/chopped (D2) diet with thin liquid.      HPI HPI: 86 year old female who presents after being found unresponsive at home overnight in morning of 11/12. Daughter performed CPR briefly. Unclear if she truly lost pulses. She did have spontaneous circulation when EMS arrived. Pt with pna in April of this year and since then has grown progressively weak, has struggled with protein calorie malnutrition, and has about a 20 pound unintentional weight loss. Pt with with PMH of COPD, HTN, hyponatremia, and recent admission for pneumonia in April of this year, Anxiety, Arthritis, Colitis, Complication of anesthesia, GERD, Headache, Hypertension, and Urinary tract infection (early aug 2014).      SLP Plan   Esophageal assessment recommended      Recommendations for follow up therapy are one component of a multi-disciplinary discharge planning process, led by the attending physician.  Recommendations may be updated based on patient status, additional functional criteria and insurance authorization.    Recommendations  Diet recommendations: Dysphagia 2 (fine  chop);Thin liquid Medication Administration: Whole meds with puree (Please watch pt with medications) Supervision: Staff to assist with self feeding Compensations: Slow rate;Small sips/bites Postural Changes and/or Swallow Maneuvers: Seated upright 90 degrees;Upright 30-60 min after meal                Oral Care Recommendations: Oral care BID Follow Up Recommendations: No SLP follow up Assistance recommended at discharge: Frequent or constant Supervision/Assistance SLP Visit Diagnosis: Dysphagia, oral phase (R13.11);Dysphagia, unspecified (R13.10)           Celedonio Savage , MA, [place] Office: [phone]  09/11/2022, 11:13 AM

## 2022-09-11 NOTE — TOC Progression Note (Signed)
Transition of Care [facility]) - Progression Note    Patient Details  Name: Carol [name] MRN: [id number] Date of Birth: 1936/01/16  Transition of Care [facility]) CM/SW [place], [place] Phone Number: 09/11/2022, 2:19 PM  Clinical Narrative:    CSW spoke with pt daughter Kennyth Lose she and her siblings are reviewing facilities and will follow up with CSW with final decision.         Expected Discharge Plan and Services                                                 Social Determinants of Health (SDOH) Interventions    Readmission Risk Interventions     No data to display

## 2022-09-12 ENCOUNTER — Inpatient Hospital Stay (HOSPITAL_COMMUNITY): Payer: Medicare Other

## 2022-09-12 DIAGNOSIS — F32A Depression, unspecified: Secondary | ICD-10-CM

## 2022-09-12 DIAGNOSIS — J449 Chronic obstructive pulmonary disease, unspecified: Secondary | ICD-10-CM

## 2022-09-12 DIAGNOSIS — I5023 Acute on chronic systolic (congestive) heart failure: Secondary | ICD-10-CM | POA: Diagnosis present

## 2022-09-12 DIAGNOSIS — N179 Acute kidney failure, unspecified: Secondary | ICD-10-CM

## 2022-09-12 DIAGNOSIS — R4182 Altered mental status, unspecified: Secondary | ICD-10-CM | POA: Diagnosis not present

## 2022-09-12 MED ORDER — SPIRONOLACTONE 25 MG PO TABS: 12.5000 mg | ORAL_TABLET | Freq: Every day | ORAL | 0 refills | Status: DC

## 2022-09-12 MED ORDER — ADULT MULTIVITAMIN W/MINERALS CH: 1.0000 | ORAL_TABLET | Freq: Every day | ORAL | 0 refills | Status: AC

## 2022-09-12 MED ORDER — FUROSEMIDE 20 MG PO TABS: 20.0000 mg | ORAL_TABLET | Freq: Every day | ORAL | Status: DC | PRN

## 2022-09-12 MED ORDER — EMPAGLIFLOZIN 10 MG PO TABS: 10.0000 mg | ORAL_TABLET | Freq: Every day | ORAL | 0 refills | Status: DC

## 2022-09-12 MED ORDER — METOPROLOL SUCCINATE ER 50 MG PO TB24: 50.0000 mg | ORAL_TABLET | Freq: Every morning | ORAL | 0 refills | Status: DC

## 2022-09-12 MED ORDER — ASPIRIN 81 MG PO CHEW: 81.0000 mg | CHEWABLE_TABLET | Freq: Every day | ORAL | 0 refills | Status: AC

## 2022-09-12 MED ORDER — LORAZEPAM 1 MG PO TABS: 1.0000 mg | ORAL_TABLET | Freq: Every day | ORAL | 0 refills | Status: AC

## 2022-09-12 MED ORDER — VENLAFAXINE HCL ER 37.5 MG PO CP24: 37.5000 mg | ORAL_CAPSULE | Freq: Every day | ORAL | 0 refills | Status: DC

## 2022-09-12 MED ORDER — FUROSEMIDE 20 MG PO TABS: 20.0000 mg | ORAL_TABLET | Freq: Every day | ORAL | 0 refills | Status: DC | PRN

## 2022-09-12 MED ORDER — ENSURE ENLIVE PO LIQD: 237.0000 mL | Freq: Two times a day (BID) | ORAL | 0 refills | Status: AC

## 2022-09-12 MED ORDER — MIRTAZAPINE 15 MG PO TABS: 15.0000 mg | ORAL_TABLET | Freq: Every day | ORAL | 0 refills | Status: DC

## 2022-09-12 MED ORDER — VENLAFAXINE HCL ER 75 MG PO CP24: 75.0000 mg | ORAL_CAPSULE | Freq: Every day | ORAL | 0 refills | Status: DC

## 2022-09-12 MED ORDER — MIRTAZAPINE 15 MG PO TBDP: 7.5000 mg | ORAL_TABLET | Freq: Every day | ORAL | 0 refills | Status: DC

## 2022-09-12 MED ORDER — LOSARTAN POTASSIUM 25 MG PO TABS: 25.0000 mg | ORAL_TABLET | Freq: Every evening | ORAL | 0 refills | Status: DC

## 2022-09-12 MED ORDER — IVABRADINE HCL 5 MG PO TABS: 5.0000 mg | ORAL_TABLET | Freq: Two times a day (BID) | ORAL | 0 refills | Status: DC

## 2022-09-12 NOTE — Assessment & Plan Note (Addendum)
Weight loss and dysphagia.   Esophagogram with moderate dysmotility characterized by tertiary contractions with to and fro movement of barium. Will continue with dysphagia 3 diet and aspiration precautions.

## 2022-09-12 NOTE — Progress Notes (Addendum)
Speech Language Pathology Treatment: Dysphagia  Patient Details Name: Carol [name] MRN: [id number] DOB: Jun 21, 1936 Today's Date: 09/12/2022 Time: 7893-8101 SLP Time Calculation (min) (ACUTE ONLY): 11 min  Assessment / Plan / Recommendation Clinical Impression  Pt seen for dysphagia after barium esophagram this morning with following results: "moderate dysmotility characterized by tertiary contractions with to and fro movement of barium". Pt hungry and eager for regular texture/thin liquids which she consumed with adequate oral mastication/manipulation and no s/sx aspiration. Discussed textures with pt and granddaughter who reports her dentures are donned but suboptimal fit. She states they have been cutting her meats at home and is agreeable for a Dys 3 texture (chopped meats) presently. Continue thin liquids and esophageal precautions which were reviewed with pt and her granddaughter. Per chart plans are for transfer to SNF today. ST will sign off.    HPI HPI: 86 year old female who presents after being found unresponsive at home overnight in morning of 11/12. Daughter performed CPR briefly. Unclear if she truly lost pulses. She did have spontaneous circulation when EMS arrived. Pt with pna in April of this year and since then has grown progressively weak, has struggled with protein calorie malnutrition, and has about a 20 pound unintentional weight loss. Pt with with PMH of COPD, HTN, hyponatremia, and recent admission for pneumonia in April of this year, Anxiety, Arthritis, Colitis, Complication of anesthesia, GERD, Headache, Hypertension, and Urinary tract infection (early aug 2014).      SLP Plan  All goals met      Recommendations for follow up therapy are one component of a multi-disciplinary discharge planning process, led by the attending physician.  Recommendations may be updated based on patient status, additional functional criteria and insurance authorization.     Recommendations  Diet recommendations: Dysphagia 3 (mechanical soft);Thin liquid Liquids provided via: Straw;Cup Medication Administration: Whole meds with puree Supervision: Patient able to self feed Compensations: Slow rate;Small sips/bites Postural Changes and/or Swallow Maneuvers: Seated upright 90 degrees;Upright 30-60 min after meal                Oral Care Recommendations: Oral care BID Follow Up Recommendations: No SLP follow up Assistance recommended at discharge: PRN SLP Visit Diagnosis: Dysphagia, unspecified (R13.10) Plan: All goals met           Houston Siren  09/12/2022, 12:42 PM

## 2022-09-12 NOTE — Assessment & Plan Note (Addendum)
Echocardiogram with reduced LV systolic function EF 20 to 25% with global hypokinesis, RV systolic function preserved, trivial pericardial effusion.   Patient with signs of cardiogenic shock on admission, and requires vasopressors.  Plan to continue medical therapy with empagliflozin, metoprolol, losartan and metoprolol.  Continue with ivabradine and spironolactone.  Add furosemide as needed for signs of volume overload.

## 2022-09-12 NOTE — Progress Notes (Signed)
Mobility Specialist - Progress Note   09/12/22 1606  Mobility  Activity Ambulated with assistance to bathroom  Level of Assistance Standby assist, set-up cues, supervision of patient - no hands on  Assistive Device Front wheel walker  Distance Ambulated (ft) 10 ft  Activity Response Tolerated well  $Mobility charge 1 Mobility    Pt received in bed requesting to use BR. Left in bed w/ call bell and all needs met.   Paulla Dolly Mobility Specialist

## 2022-09-12 NOTE — Progress Notes (Signed)
OT Cancellation Note  Patient Details Name: [place] A Pallone MRN: [id number] DOB: 04/11/1936    Cancelled Treatment:    Reason Eval/Treat Not Completed: Patient at procedure or test/ unavailable. Pt off unit. OT will f/u as schedule allows.    Laverle Hobby, OTR/L, [place] [facility] Office: [place] 09/12/2022, 10:29 AM

## 2022-09-12 NOTE — TOC Transition Note (Incomplete)
Transition of Care [facility]) - CM/SW Discharge Note   Patient Details  Name: Carol [name] MRN: [id number] Date of Birth: 02-09-1936  [facility]) CM/SW Contact:  Tresa Endo Phone Number: 09/12/2022, 1:35 PM   Clinical Narrative:    Patient will DC to: [facility] Anticipated DC date: 09/12/2022 Family notified: Pt Daughter  Transport by:   Per MD patient ready for DC to [facility]. RN to call report prior to discharge [phone]). RN, patient, patient's family, and facility notified of DC. Discharge Summary and FL2 sent to facility. DC packet on chart. Ambulance transport requested for patient.   CSW will sign off for now as social work intervention is no longer needed. Please consult [place] again if new needs arise.           Patient Goals and CMS Choice        Discharge Placement                       Discharge Plan and Services                                     Social Determinants of Health (SDOH) Interventions     Readmission Risk Interventions     No data to display

## 2022-09-12 NOTE — Discharge Summary (Addendum)
Physician Discharge Summary   Patient: Carol [name] MRN: [id number] DOB: [birth year]  Admit date:     09/06/2022  Discharge date: 09/15/22  Discharge Physician: Jimmy Picket Linken Mcglothen   PCP: Shirline Frees, MD   Recommendations at discharge:    Mirtazapine, lorazepam, and venlafaxine doses have been decreased. Discontinued buspirone. Patient has been placed on guideline directed medical therapy for heart failure. Continue loop diuretic as needed for signs of volume overload.  Patient with poor prognosis, very deconditioned, per her family no invasive procedures Follow up as outpatient.   Follow up renal function as outpatient.   I spoke with patient's daughter at the bedside, we talked in detail about patient's condition, plan of care and prognosis and all questions were addressed.  Hospitalization was prolonged due to waiting for [facility].    Discharge Diagnoses: Principal Problem:   AMS (altered mental status) Active Problems:   Acute on chronic systolic CHF (congestive heart failure) (HCC)   AKI (acute kidney injury) ([place])   Essential hypertension   Coronary atherosclerosis due to calcified coronary lesion   Mixed hyperlipidemia   COPD (chronic obstructive pulmonary disease) ([place]): Probable   Protein-calorie malnutrition, severe   Depression  Resolved Problems:   * No resolved hospital problems. [facility] Course: Mrs. Diamond was admitted to the hospital with the working diagnosis of acute toxic encephalopathy and new diagnosis of heart failure.   86 yo female with the past medical history of COPD, hypertension, and chronic hyponatremia, who presented with altered mental status. Patient with decreased po intake for several months and unintentional 20 lbs weight loss. Noted to have acute change in her mentation for 24 hrs, with confusion and weakness, on 10/12 she was found not responsive and her daughter did CPR and called EMS. When EMS arrived patient had pulse and  was transported to the ED. Her blood pressure was 95/51, HR 85, RR 21 and 02 saturation 99% on supplemental 02, patient was not responsive, grimacing to pain stimuli, lungs with no wheezing or rales, heart with S1 and S2 present and rhythmic, abdomen soft and non tender and no lower extremity edema.   Na 133, K 3,3 Cl 94 bicarbonate  27 glucose 178, bun 10 cr 1,0 AST 14 ALT 14  High sensitive troponin 20 and 29  Lactic acid 2,2  Wbc 8,7 hgb 11,1 plt 193  Urine SG 1,009, 30 protein with negative leukocytes.  Toxicology positive for benzodiazepines.   Chest radiograph with hyperinflation with no infiltrates.   EKG 76 bpm, normal axis, left bundle branch block, sinus rhythm with poor R R wave progression, no significant ST segment or T wave changes.   Patient was placed on norepinephrine and admitted to the intensive care unit.  Echocardiogram with LV EF reduced 20 to 25%.   Patient with poor prognosis, per her family conservative care.  Patient developed delirium and acute metabolic encephalopathy.  Pending transfer to SNF.         Assessment and Plan: * AMS (altered mental status) Syncope  Acute metabolic encephalopathy with delirium. Multifactorial, has improved with supportive medical therapy.  Today patient is awake and alert with no agitation or confusion Hear family is at the bedside.  No sitter for the last 24 hrs and no restrains.  Plan to transfer to SNF today.   Acute on chronic systolic CHF (congestive heart failure) (HCC) Echocardiogram with reduced LV systolic function EF 20 to 25% with global hypokinesis, RV systolic function preserved, trivial pericardial effusion.  Patient with signs of cardiogenic shock on admission, and requires vasopressors.  Plan to continue medical therapy with empagliflozin, metoprolol, losartan and metoprolol.  Continue with ivabradine and spironolactone.  Add furosemide as needed for signs of volume overload.   AKI (acute kidney  injury) ([place]) Hyponatremia and hypokalemia  Renal function has been stable with serum cr at 0,60, K is 3,5 and serum bicarbonate at 29. Continue diuresis with empagliflozin and spironolactone Add furosemide as needed.  Follow up renal function and electrolytes as outpatient.   Essential hypertension Continue blood pressure control with metoprolol, losartan and diuresis with spironolactone.   Coronary atherosclerosis due to calcified coronary lesion No acute coronary syndrome No invasive testing per family report, patient with advance age and very debilitated.  Continue with aspirin and statin   Mixed hyperlipidemia Continue statin therapy.   COPD (chronic obstructive pulmonary disease) ([place]): Probable Tobacco abuse  No clinical signs of COPD exacerbation, continue with oxymetry monitoring.   Protein-calorie malnutrition, severe Weight loss and dysphagia.   Esophagogram with moderate dysmotility characterized by tertiary contractions with to and fro movement of barium. Will continue with dysphagia 3 diet and aspiration precautions.   Depression Continue with buspirone and mirtazapine.  Continue with venlafaxine.          Consultants: cardiology Procedures performed: none   Disposition: Skilled nursing facility Diet recommendation:  Cardiac diet DISCHARGE MEDICATION: Allergies as of 09/15/2022       Reactions   Ciprofloxacin Nausea Only, Other (See Comments)   Nausea and legs weak   Nitrofuran Derivatives Nausea Only, Other (See Comments)   nitrofuratonin bad nausea, legs weak   Paxil [paroxetine] Other (See Comments)   Sick on stomach   Penicillins Other (See Comments)   Sick on stomach   Sulfa Antibiotics Other (See Comments)   jittery        Medication List     STOP taking these medications    ALIGN PO   amLODipine 5 MG tablet Commonly known as: NORVASC   busPIRone 5 MG tablet Commonly known as: BUSPAR   nicotine 21 mg/24hr patch Commonly  known as: NICODERM CQ - dosed in mg/24 hours   ondansetron 4 MG tablet Commonly known as: ZOFRAN   umeclidinium bromide 62.5 MCG/INH Aepb Commonly known as: INCRUSE ELLIPTA   Venlafaxine HCl 225 MG Tb24 Replaced by: venlafaxine XR 37.5 MG 24 hr capsule       TAKE these medications    acetaminophen 650 MG CR tablet Commonly known as: TYLENOL Take 1,300 mg by mouth daily as needed for pain.   aspirin 81 MG chewable tablet Chew 1 tablet (81 mg total) by mouth daily.   empagliflozin 10 MG Tabs tablet Commonly known as: Jardiance Take 1 tablet (10 mg total) by mouth daily.   feeding supplement Liqd Take 237 mLs by mouth 2 (two) times daily between meals.   furosemide 20 MG tablet Commonly known as: LASIX Take 1 tablet (20 mg total) by mouth daily as needed for edema or fluid (weihgt gain 2 to 3 lbs in 24 hrs or 5 lbs in 7 days.).   ivabradine 5 MG Tabs tablet Commonly known as: CORLANOR Take 1 tablet (5 mg total) by mouth 2 (two) times daily with a meal.   LORazepam 1 MG tablet Commonly known as: ATIVAN Take 1 tablet (1 mg total) by mouth daily at 8 pm. What changed: when to take this   losartan 25 MG tablet Commonly known as: COZAAR Take 1 tablet (25  mg total) by mouth every evening.   metoprolol succinate 50 MG 24 hr tablet Commonly known as: TOPROL-XL Take 1 tablet (50 mg total) by mouth in the morning.   mirtazapine 15 MG tablet Commonly known as: REMERON Take 1 tablet (15 mg total) by mouth at bedtime.   multivitamin with minerals Tabs tablet Take 1 tablet by mouth daily.   pantoprazole 20 MG tablet Commonly known as: PROTONIX Take 1 tablet (20 mg total) by mouth daily. What changed: how much to take   polyethylene glycol 17 g packet Commonly known as: MIRALAX / GLYCOLAX Take 17 g by mouth daily.   raloxifene 60 MG tablet Commonly known as: EVISTA Take 60 mg by mouth daily.   simvastatin 20 MG tablet Commonly known as: ZOCOR Take 20 mg by mouth  at bedtime.   spironolactone 25 MG tablet Commonly known as: ALDACTONE Take 0.5 tablets (12.5 mg total) by mouth daily.   Trelegy Ellipta 100-62.5-25 MCG/ACT Aepb Generic drug: Fluticasone-Umeclidin-Vilant Inhale 28 each into the lungs daily.   venlafaxine XR 37.5 MG 24 hr capsule Commonly known as: EFFEXOR-XR Take 1 capsule (37.5 mg total) by mouth daily with breakfast. Replaces: Venlafaxine HCl 225 MG Tb24        Follow-up Information     Tolia, Sunit, DO. Call in 1 day(s).   Specialties: Cardiology, Vascular Surgery Why: To arrange follow up Contact information: [address] [place] [id number] [phone]                Discharge Exam: Danley Danker Weights   09/10/22 0500 09/11/22 0500 09/12/22 0500  Weight: 36.7 kg 42.2 kg 43.8 kg   BP 138/61 (BP Location: Right Arm)   Pulse 82   Temp 97.8 F (36.6 C) (Oral)   Resp 16   Ht 5' (1.524 m)   Wt 43.8 kg   SpO2 96%   BMI 18.86 kg/m   Patient is awake and alert ENT with mild pallor Cardiovascular with S1 and S2 present and rhythmic with no gallops or rubs, no murmurs Respiratory with no rales or wheezing Abdomen with no distention  No lower extremity edema   Condition at discharge: stable  The results of significant diagnostics from this hospitalization (including imaging, microbiology, ancillary and laboratory) are listed below for reference.   Imaging Studies: DG ESOPHAGUS W SINGLE CM (SOL OR THIN BA)  Result Date: 09/12/2022 CLINICAL DATA:  86 year old presents with recent weight loss, failure to thrive. Esophagram requested for esophageal assessment. EXAM: ESOPHAGUS/BARIUM SWALLOW/TABLET STUDY TECHNIQUE: Single contrast examination was performed using thin liquid barium. This exam was performed by Brynda Greathouse PA-C, and was supervised and interpreted by Nelson Chimes, MD. FLUOROSCOPY: Radiation Exposure Index (as provided by the fluoroscopic device): 6.5 mGy COMPARISON:  CT Chest WO  Contrast 07/18/22 FINDINGS: Swallowing: Appears normal. No vestibular penetration or aspiration seen. Pharynx: Unremarkable. Esophagus: Normal appearance. No filing defects or areas of narrowing. Esophageal motility: Moderate dysmotility evidenced by tertiary contractions and retrograde flow of barium bolus. Hiatal Hernia: None. Gastroesophageal reflux: None visualized. Ingested 60m barium tablet: Not given as patient does not swallow pills whole at baseline. Other: Limited evaluation due to patient's debility and clinical status. Appears to occasionally hold or pocket liquid. IMPRESSION: Moderate dysmotility characterized by tertiary contractions with to and fro movement of barium. Electronically Signed   By: MNelson ChimesM.D.   On: 09/12/2022 11:48   VAS [place]OWER EXTREMITY VENOUS (DVT)  Result Date: 09/08/2022  Lower  Venous DVT Study Patient Name:  Carol A Kincer  Date of Exam:   09/08/2022 Medical Rec #: [id number]          Accession #:    [id number] Date of Birth: 1936-03-14         Patient Gender: F Patient Age:   86 years Exam Location:  [facility] Procedure:      VAS [place] LOWER EXTREMITY VENOUS (DVT) Referring Phys: Collier Bullock --------------------------------------------------------------------------------  Indications: "Unresponsive episode".  Limitations: Body habitus (BMI 16.92). Positioning in bed with knees bent secondary to back pain. Comparison Study: No prior study on file Performing Technologist: Sharion Dove RVS  Examination Guidelines: A complete evaluation includes B-mode imaging, spectral Doppler, color Doppler, and power Doppler as needed of all accessible portions of each vessel. Bilateral testing is considered an integral part of a complete examination. Limited examinations for reoccurring indications may be performed as noted. The reflux portion of the exam is performed with the patient in reverse Trendelenburg.   +---------+---------------+---------+-----------+----------+---------------+ RIGHT    CompressibilityPhasicitySpontaneityPropertiesThrombus Aging  +---------+---------------+---------+-----------+----------+---------------+ CFV      Full           Yes      Yes                                  +---------+---------------+---------+-----------+----------+---------------+ SFJ      Full                                                         +---------+---------------+---------+-----------+----------+---------------+ FV Prox  Full                                                         +---------+---------------+---------+-----------+----------+---------------+ FV Mid   Full                                                         +---------+---------------+---------+-----------+----------+---------------+ FV DistalFull           Yes      Yes                                  +---------+---------------+---------+-----------+----------+---------------+ PFV      Full                                                         +---------+---------------+---------+-----------+----------+---------------+ POP      Full           Yes      Yes                                  +---------+---------------+---------+-----------+----------+---------------+  PTV                                                   patent by color +---------+---------------+---------+-----------+----------+---------------+ PERO                                                  patent by color +---------+---------------+---------+-----------+----------+---------------+   +---------+---------------+---------+-----------+----------+--------------+ LEFT     CompressibilityPhasicitySpontaneityPropertiesThrombus Aging +---------+---------------+---------+-----------+----------+--------------+ CFV      Full           Yes      Yes                                  +---------+---------------+---------+-----------+----------+--------------+ SFJ      Full                                                        +---------+---------------+---------+-----------+----------+--------------+ FV Prox  Full                                                        +---------+---------------+---------+-----------+----------+--------------+ FV Mid   Full                                                        +---------+---------------+---------+-----------+----------+--------------+ FV DistalFull                                                        +---------+---------------+---------+-----------+----------+--------------+ PFV      Full                                                        +---------+---------------+---------+-----------+----------+--------------+ POP      Full           Yes      Yes                                 +---------+---------------+---------+-----------+----------+--------------+ PTV      Full                                                        +---------+---------------+---------+-----------+----------+--------------+  PERO     Full                                                        +---------+---------------+---------+-----------+----------+--------------+     Summary: BILATERAL: - No evidence of deep vein thrombosis seen in the lower extremities, bilaterally. -No evidence of popliteal cyst, bilaterally.   *See table(s) above for measurements and observations. Electronically signed by Monica Martinez MD on 09/08/2022 at 6:59:03 PM.    Final    ECHOCARDIOGRAM COMPLETE  Result Date: 09/06/2022    ECHOCARDIOGRAM REPORT   Patient Name:   Carol A Lookingbill Date of Exam: 09/06/2022 Medical Rec #:  [id number]         Height:       60.0 in Accession #:    [id number]        Weight:       82.2 lb Date of Birth:  03-Feb-1936        BSA:          1.279 m Patient Age:    32 years          BP:            102/49 mmHg Patient Gender: F                 HR:           113 bpm. Exam Location:  Inpatient Procedure: 2D Echo, Cardiac Doppler and Color Doppler Indications:    Shock ([place]) [[id number]]  History:        Patient has no prior history of Echocardiogram examinations.                 COPD; Risk Factors:Hypertension and Current Smoker.  Sonographer:    Ronny Flurry Referring Phys: [id number] [place]  1. Left ventricular ejection fraction, by estimation, is 20 to 25%. The left ventricle has severely decreased function. The left ventricle demonstrates global hypokinesis. There is mild left ventricular hypertrophy. Left ventricular diastolic parameters  are indeterminate.  2. Right ventricular systolic function is normal. The right ventricular size is normal.  3. Left atrial size was mildly dilated.  4. There is no evidence of cardiac tamponade.  5. The mitral valve is normal in structure. Trivial mitral valve regurgitation. No evidence of mitral stenosis.  6. The aortic valve is normal in structure. Aortic valve regurgitation is not visualized. No aortic stenosis is present. FINDINGS  Left Ventricle: Left ventricular ejection fraction, by estimation, is 20 to 25%. The left ventricle has severely decreased function. The left ventricle demonstrates global hypokinesis. The left ventricular internal cavity size was normal in size. There is mild left ventricular hypertrophy. Left ventricular diastolic parameters are indeterminate. Right Ventricle: The right ventricular size is normal. No increase in right ventricular wall thickness. Right ventricular systolic function is normal. Left Atrium: Left atrial size was mildly dilated. Right Atrium: Right atrial size was normal in size. Pericardium: Trivial pericardial effusion is present. There is no evidence of cardiac tamponade. Mitral Valve: The mitral valve is normal in structure. Trivial mitral valve regurgitation. No evidence of mitral valve stenosis. Tricuspid  Valve: The tricuspid valve is normal in structure. Tricuspid valve regurgitation is mild . No evidence of tricuspid stenosis. Aortic Valve: The aortic valve is normal in structure. Aortic valve regurgitation is  not visualized. No aortic stenosis is present. Aortic valve mean gradient measures 4.5 mmHg. Aortic valve peak gradient measures 8.3 mmHg. Aortic valve area, by VTI measures 2.02 cm. Pulmonic Valve: The pulmonic valve was normal in structure. Pulmonic valve regurgitation is not visualized. No evidence of pulmonic stenosis. Aorta: The aortic root is normal in size and structure. Venous: The inferior vena cava was not well visualized. IAS/Shunts: No atrial level shunt detected by color flow Doppler.  LEFT VENTRICLE PLAX 2D LVIDd:         4.70 cm LV PW:         0.90 cm LV IVS:        1.10 cm LVOT diam:     1.90 cm LV SV:         42 LV SV Index:   33 LVOT Area:     2.84 cm  RIGHT VENTRICLE             IVC RV S prime:     17.70 cm/s  IVC diam: 1.60 cm TAPSE (M-mode): 1.8 cm LEFT ATRIUM             Index        RIGHT ATRIUM          Index LA diam:        3.40 cm 2.66 cm/m   RA Area:     8.19 cm LA Vol (A2C):   44.7 ml 34.94 ml/m  RA Volume:   16.30 ml 12.74 ml/m LA Vol (A4C):   23.7 ml 18.52 ml/m LA Biplane Vol: 35.0 ml 27.36 ml/m  AORTIC VALVE AV Area (Vmax):    2.33 cm AV Area (Vmean):   2.26 cm AV Area (VTI):     2.02 cm AV Vmax:           144.00 cm/s AV Vmean:          101.200 cm/s AV VTI:            0.208 m AV Peak Grad:      8.3 mmHg AV Mean Grad:      4.5 mmHg LVOT Vmax:         118.50 cm/s LVOT Vmean:        80.500 cm/s LVOT VTI:          0.148 m LVOT/AV VTI ratio: 0.71  AORTA Ao Root diam: 3.50 cm MV E velocity: 108.00 cm/s  TRICUSPID VALVE                             TR Peak grad:   20.1 mmHg                             TR Vmax:        224.00 cm/s                              SHUNTS                             Systemic VTI:  0.15 m                             Systemic Diam: 1.90 cm Skeet Latch MD Electronically signed by Skeet Latch MD Signature Date/Time:  09/06/2022/4:25:33 PM    Final    CT Head Wo Contrast  Result Date: 09/06/2022 CLINICAL DATA:  86 year old female with altered mental status change. Status post CPR. EXAM: CT HEAD WITHOUT CONTRAST TECHNIQUE: Contiguous axial images were obtained from the base of the skull through the vertex without intravenous contrast. RADIATION DOSE REDUCTION: This exam was performed according to the departmental dose-optimization program which includes automated exposure control, adjustment of the mA and/or kV according to patient size and/or use of iterative reconstruction technique. COMPARISON:  None Available. FINDINGS: Brain: There is confluent and slightly asymmetric bilateral cerebral white matter hypodensity, greater in the right hemisphere (series 3, images 22, 27). But No midline shift, mass effect, or evidence of intracranial mass lesion. No acute intracranial hemorrhage identified. Ventricle size and configuration within normal limits for age. Basilar cisterns are normal. No superimposed cortically based acute infarct identified. And the deep gray nuclei, brainstem and cerebellum appear negative for age. Vascular: Mild Calcified atherosclerosis at the skull base. No suspicious intracranial vascular hyperdensity. Skull: No acute osseous abnormality identified. Sinuses/Orbits: Visualized paranasal sinuses and mastoids are clear. Other: No acute orbit or scalp soft tissue finding. IMPRESSION: No prior study for comparison. Confluent cerebral white matter hypodensity, but without intracranial mass effect or obvious cerebral edema, is nonspecific but most commonly due to chronic small vessel disease. If the patient's mental status does not improve, a repeat noncontrast head CT in 24-48 hours, or alternatively a noncontrast Brain MRI would be valuable. Electronically Signed   By: Genevie Ann M.D.   On: 09/06/2022 07:19   DG Chest Port 1  View  Result Date: 09/06/2022 CLINICAL DATA:  Unresponsive EXAM: PORTABLE CHEST 1 VIEW COMPARISON:  05/13/2022 FINDINGS: Mild cardiac enlargement. No vascular congestion, edema, or consolidation. Emphysematous changes in the lungs. Scarring in the lung apices. No pleural effusions. No pneumothorax. Mediastinal contours appear intact. Calcification of the aorta. Old fracture deformity of the right shoulder. IMPRESSION: Emphysematous changes and scattered fibrosis in the lungs. No focal consolidation. Electronically Signed   By: Lucienne Capers M.D.   On: 09/06/2022 01:29    Microbiology: Results for orders placed or performed during the hospital encounter of 09/06/22  SARS Coronavirus 2 by RT PCR (hospital order, performed in [facility] hospital lab) *cepheid single result test* Anterior Nasal Swab     Status: None   Collection Time: 09/06/22  7:28 AM   Specimen: Anterior Nasal Swab  Result Value Ref Range Status   SARS Coronavirus 2 by RT PCR NEGATIVE NEGATIVE Final    Comment: (NOTE) SARS-CoV-2 target nucleic acids are NOT DETECTED.  The SARS-CoV-2 RNA is generally detectable in upper and lower respiratory specimens during the acute phase of infection. The lowest concentration of SARS-CoV-2 viral copies this assay can detect is 250 copies / mL. A negative result does not preclude SARS-CoV-2 infection and should not be used as the sole basis for treatment or other patient management decisions.  A negative result may occur with improper specimen collection / handling, submission of specimen other than nasopharyngeal swab, presence of viral mutation(s) within the areas targeted by this assay, and inadequate number of viral copies (<250 copies / mL). A negative result must be combined with clinical observations, patient history, and epidemiological information.  Fact Sheet for Patients:   https://www.patel.info/  Fact Sheet for Healthcare  Providers: https://hall.com/  This test is not yet approved or  cleared by the [place] FDA and has been authorized for detection and/or diagnosis of SARS-CoV-2  by FDA under an Emergency Use Authorization (EUA).  This EUA will remain in effect (meaning this test can be used) for the duration of the COVID-19 declaration under Section 564(b)(1) of the Act, 21 U.S.C. section 360bbb-3(b)(1), unless the authorization is terminated or revoked sooner.  Performed at [facility], [place] [address], [place], [place] [address]   MRSA Next Gen by PCR, Nasal     Status: Abnormal   Collection Time: 09/06/22  5:40 PM   Specimen: Nasal Mucosa; Nasal Swab  Result Value Ref Range Status   MRSA by PCR Next Gen DETECTED (A) NOT DETECTED Final    Comment: RESULT CALLED TO, READ BACK BY AND VERIFIED WITH: RN [place] [phone] '@2016'$  FH (NOTE) The GeneXpert MRSA Assay (FDA approved for NASAL specimens only), is one component of a comprehensive MRSA colonization surveillance program. It is not intended to diagnose MRSA infection nor to guide or monitor treatment for MRSA infections. Test performance is not FDA approved in patients less than 41 years old. Performed at [facility], Normal [address], [place], [place] [address]     Labs: CBC: Recent Labs  Lab 09/06/22 0104 09/07/22 0143 09/08/22 0619  WBC 8.7 7.5 8.3  NEUTROABS 6.2  --  6.0  HGB 11.1* 11.3* 12.5  HCT 32.3* 33.4* 37.4  MCV 100.9* 101.5* 100.8*  PLT 193 173 062   Basic Metabolic Panel: Recent Labs  Lab 09/06/22 0104 09/07/22 0143 09/07/22 1652 09/08/22 0619 09/08/22 2009 09/09/22 0545 09/10/22 0222  NA 133* 140  --  134*  --  137 136  K 3.3* 3.9  --  3.2*  --  3.6 3.5  CL 94* 101  --  94*  --  95* 93*  CO2 27 31  --  34*  --  32 29  GLUCOSE 178* 95  --  102*  --  85 73  BUN 10 8  --  <5*  --  8 14  CREATININE 1.04* 0.61  --  0.59  --  0.64 0.60  CALCIUM 9.0 8.7*  --  8.6*  --   9.2 9.2  MG  --  1.4* 2.2 1.9 1.8 1.9  --   PHOS  --  4.2 3.8 3.6 3.5 4.1  --    Liver Function Tests: Recent Labs  Lab 09/06/22 0104 09/08/22 0619  AST 14* 19  ALT 14 15  ALKPHOS 34* 42  BILITOT 0.7 0.4  PROT 5.0* 5.6*  ALBUMIN 3.0* 2.9*   CBG: Recent Labs  Lab 09/06/22 1943 09/06/22 2329 09/07/22 0333  GLUCAP 72 82 80    Discharge time spent: greater than 30 minutes.  Signed: Tawni Millers, MD [facility] 09/12/2022

## 2022-09-12 NOTE — Hospital Course (Signed)
Carol [name] was admitted to the hospital with the working diagnosis of acute toxic encephalopathy and new diagnosis of heart failure.   86 yo female with the past medical history of COPD, hypertension, and chronic hyponatremia, who presented with altered mental status. Patient with decreased po intake for several months and unintentional 20 lbs weight loss. Noted to have acute change in her mentation for 24 hrs, with confusion and weakness, on 10/12 she was found not responsive and her daughter did CPR and called EMS. When EMS arrived patient had pulse and was transported to the ED. Her blood pressure was 95/51, HR 85, RR 21 and 02 saturation 99% on supplemental 02, patient was not responsive, grimacing to pain stimuli, lungs with no wheezing or rales, heart with S1 and S2 present and rhythmic, abdomen soft and non tender and no lower extremity edema.   Na 133, K 3,3 Cl 94 bicarbonate  27 glucose 178, bun 10 cr 1,0 AST 14 ALT 14  High sensitive troponin 20 and 29  Lactic acid 2,2  Wbc 8,7 hgb 11,1 plt 193  Urine SG 1,009, 30 protein with negative leukocytes.  Toxicology positive for benzodiazepines.   Chest radiograph with hyperinflation with no infiltrates.   EKG 76 bpm, normal axis, left bundle branch block, sinus rhythm with poor R R wave progression, no significant ST segment or T wave changes.   Patient was placed on norepinephrine and admitted to the intensive care unit.  Echocardiogram with LV EF reduced 20 to 25%.   Patient with poor prognosis, per her family conservative care.  Patient developed delirium and acute metabolic encephalopathy.  Pending transfer to SNF.

## 2022-09-12 NOTE — Progress Notes (Signed)
Mobility Specialist - Progress Note   09/12/22 1400  Mobility  Activity Ambulated with assistance in hallway  Level of Assistance Standby assist, set-up cues, supervision of patient - no hands on  Assistive Device Front wheel walker  Distance Ambulated (ft) 150 ft  Activity Response Tolerated well  $Mobility charge 1 Mobility    Pt received in bed agreeable to mobility. Left in bed w/ call bell in reach and all needs met.   Paulla Dolly Mobility Specialist

## 2022-09-12 NOTE — Assessment & Plan Note (Signed)
Tobacco abuse  No clinical signs of COPD exacerbation, continue with oxymetry monitoring.

## 2022-09-12 NOTE — TOC Progression Note (Signed)
Transition of Care [facility]) - Progression Note    Patient Details  Name: Carol [name] MRN: [id number] Date of Birth: [birth year]  Transition of Care [facility]) CM/SW [place], [place] Phone Number: 09/12/2022, 2:02 PM  Clinical Narrative:    CSW has not received pt PASRR, she will need to be seen in person for PASRR to be completed.         Expected Discharge Plan and Services           Expected Discharge Date: 09/12/22                                     Social Determinants of Health (SDOH) Interventions    Readmission Risk Interventions     No data to display

## 2022-09-12 NOTE — Assessment & Plan Note (Signed)
Continue statin therapy.

## 2022-09-12 NOTE — Assessment & Plan Note (Signed)
No acute coronary syndrome No invasive testing per family report, patient with advance age and very debilitated.  Continue with aspirin and statin

## 2022-09-12 NOTE — Assessment & Plan Note (Signed)
Continue with buspirone and mirtazapine.  Continue with venlafaxine.

## 2022-09-12 NOTE — Progress Notes (Addendum)
Occupational Therapy Treatment Patient Details Name: Carol [name] MRN: [id number] DOB: 29-Aug-1936 Today's Date: 09/12/2022   History of present illness 86 year old female who presents after being found unresponsive at home overnight in morning of 11/12. Daughter performed CPR briefly. Unclear if she truly lost pulses. She did have spontaneous circulation when EMS arrived. Pt with pna in April of this year and since then has grown progressively weak, has struggled with protein calorie malnutrition, and has about a 20 pound unintentional weight loss. PMH of COPD, HTN, hyponatremia, and recent admission for pneumonia in April of this year, Anxiety, Arthritis, Colitis, Complication of anesthesia, GERD, Headache, Hypertension, and Urinary tract infection (early aug 2014).   OT comments  Pt is progressing nicely toward her OT goals with improvements in dynamic balance, processing speed, and overall independence with ADLs. She required min A with LB ADLs and (S) for UB ADLs. Pt limited by fatigue and requiring rest breaks. Pt ready for SNF d/c. Continue OT POC.    Recommendations for follow up therapy are one component of a multi-disciplinary discharge planning process, led by the attending physician.  Recommendations may be updated based on patient status, additional functional criteria and insurance authorization.    Follow Up Recommendations  Skilled nursing-short term rehab (<3 hours/day)    Assistance Recommended at Discharge Frequent or constant Supervision/Assistance  Patient can return home with the following  Assist for transportation;Help with stairs or ramp for entrance;Direct supervision/assist for medications management;A little help with walking and/or transfers;A little help with bathing/dressing/bathroom;Assistance with cooking/housework;Direct supervision/assist for financial management   Equipment Recommendations  None recommended by OT    Recommendations for Other Services       Precautions / Restrictions Precautions Precautions: Fall Precaution Comments: Watch O2 and HR Restrictions Weight Bearing Restrictions: No       Mobility Bed Mobility Overal bed mobility: Needs Assistance Bed Mobility: Supine to Sit, Sit to Supine     Supine to sit: Supervision Sit to supine: Min assist   General bed mobility comments: Min cueing for initiation/sequencing    Transfers Overall transfer level: Needs assistance Equipment used: Rolling walker (2 wheels) Transfers: Sit to/from Stand Sit to Stand: Min guard           General transfer comment: Cueing for hand placement/RW management     Balance Overall balance assessment: Needs assistance Sitting-balance support: No upper extremity supported, Feet supported Sitting balance-Leahy Scale: Good     Standing balance support: Bilateral upper extremity supported, During functional activity Standing balance-Leahy Scale: Fair Standing balance comment: Able to sustain balance without UE support briefly                           ADL either performed or assessed with clinical judgement   ADL Overall ADL's : Needs assistance/impaired Eating/Feeding: Set up;Bed level   Grooming: Wash/dry hands;Wash/dry face;Set up;Sitting   Upper Body Bathing: Supervision/ safety;Sitting   Lower Body Bathing: Min guard;Sit to/from stand   Upper Body Dressing : Minimal assistance;Sitting   Lower Body Dressing: Minimal assistance;Sit to/from stand   Toilet Transfer: Min guard;Ambulation;Rolling walker (2 wheels)   Toileting- Clothing Manipulation and Hygiene: Minimal assistance;Sit to/from stand       Functional mobility during ADLs: Min guard;Rolling walker (2 wheels) General ADL Comments: Min A with LB ADLs, min guard with ADL transfers    Extremity/Trunk Assessment Upper Extremity Assessment Upper Extremity Assessment: Generalized weakness   Lower Extremity Assessment Lower  Extremity Assessment:  Generalized weakness         Cognition Arousal/Alertness: Awake/alert Behavior During Therapy: WFL for tasks assessed/performed Overall Cognitive Status: Impaired/Different from baseline Area of Impairment: Problem solving, Memory                 Orientation Level: Place, Person, Situation, Time Current Attention Level: Selective Memory: Decreased short-term memory Following Commands: Follows multi-step commands with increased time Safety/Judgement: Decreased awareness of deficits, Decreased awareness of safety Awareness: Emergent Problem Solving: Requires verbal cues, Requires tactile cues General Comments: Pt pleasant and cooperative; hx sundowning during inpatient stay              General Comments Granddaughter in room    Pertinent Vitals/ Pain       Pain Assessment Pain Assessment: No/denies pain         Frequency  Min 2X/week        Progress Toward Goals  OT Goals(current goals can now be found in the care plan section)  Progress towards OT goals: Progressing toward goals  Acute Rehab OT Goals Patient Stated Goal: Get warm OT Goal Formulation: With patient Time For Goal Achievement: 09/21/22 Potential to Achieve Goals: [place] Discharge plan remains appropriate       AM-PAC OT "6 Clicks" Daily Activity     Outcome Measure   Help from another person eating meals?: None Help from another person taking care of personal grooming?: A Little Help from another person toileting, which includes using toliet, bedpan, or urinal?: A Little Help from another person bathing (including washing, rinsing, drying)?: A Little Help from another person to put on and taking off regular upper body clothing?: A Little Help from another person to put on and taking off regular lower body clothing?: A Little 6 Click Score: 19    End of Session Equipment Utilized During Treatment: Gait belt;Rolling walker (2 wheels);Oxygen  OT Visit Diagnosis: Unsteadiness on  feet (R26.81);Muscle weakness (generalized) (M62.81);Other symptoms and signs involving cognitive function   Activity Tolerance Patient tolerated treatment well   Patient Left in bed;with family/visitor present;with call bell/phone within reach   Nurse Communication Mobility status        Time: 0712-1975 OT Time Calculation (min): 22 min  Charges: OT General Charges $OT Visit: 1 Visit OT Treatments $Self Care/Home Management : 8-22 mins  Laverle Hobby, OTR/L, [facility] Acute Rehab Office: [place] 09/12/2022, 3:04 PM

## 2022-09-12 NOTE — Assessment & Plan Note (Addendum)
Syncope  Acute metabolic encephalopathy with delirium. Multifactorial, has improved with supportive medical therapy.  Today patient is awake and alert with no agitation or confusion Hear family is at the bedside.  No sitter for the last 24 hrs and no restrains.  Plan to transfer to SNF today.

## 2022-09-12 NOTE — Assessment & Plan Note (Signed)
Hyponatremia and hypokalemia  Renal function has been stable with serum cr at 0,60, K is 3,5 and serum bicarbonate at 29. Continue diuresis with empagliflozin and spironolactone Add furosemide as needed.  Follow up renal function and electrolytes as outpatient.

## 2022-09-12 NOTE — Assessment & Plan Note (Signed)
Continue blood pressure control with metoprolol, losartan and diuresis with spironolactone.

## 2022-09-13 DIAGNOSIS — R4182 Altered mental status, unspecified: Secondary | ICD-10-CM | POA: Diagnosis not present

## 2022-09-13 MED ORDER — VENLAFAXINE HCL ER 37.5 MG PO CP24
37.5000 mg | ORAL_CAPSULE | Freq: Every day | ORAL | Status: AC
  Administered 2022-09-14: 37.5 mg via ORAL
  Filled 2022-09-13: qty 1

## 2022-09-13 MED ORDER — SENNA 8.6 MG PO TABS
1.0000 | ORAL_TABLET | Freq: Once | ORAL | Status: AC
  Administered 2022-09-13: 8.6 mg via ORAL
  Filled 2022-09-13: qty 1

## 2022-09-13 NOTE — Progress Notes (Signed)
Mobility Specialist - Progress Note   09/13/22 1124  Mobility  Activity Ambulated with assistance in hallway  Level of Assistance Standby assist, set-up cues, supervision of patient - no hands on  Assistive Device Front wheel walker  Distance Ambulated (ft) 150 ft  Activity Response Tolerated well  $Mobility charge 1 Mobility    Pt received in bed agreeable to mobility. Left in bed w/ call bell and all needs met.   Paulla Dolly Mobility Specialist

## 2022-09-13 NOTE — TOC Progression Note (Signed)
Transition of Care [facility]) - Progression Note    Patient Details  Name: Carol [name] MRN: [id number] Date of Birth: [birth year]  Transition of Care [facility]) CM/SW Contact  Reece Agar, [place] Phone Number: 09/13/2022, 3:22 PM  Clinical Narrative:    Pt has not bee seen by PASRR representative yet, CSW will continue to follow pt for DP.        Expected Discharge Plan and Services           Expected Discharge Date: 09/12/22                                     Social Determinants of Health (SDOH) Interventions    Readmission Risk Interventions     No data to display

## 2022-09-13 NOTE — Plan of Care (Signed)
  Problem: Health Behavior/Discharge Planning: Goal: Ability to manage health-related needs will improve Outcome: Progressing   

## 2022-09-13 NOTE — Progress Notes (Signed)
Heart Failure Navigator Progress Note  Assessed for Heart & Vascular TOC clinic readiness.  Patient does not meet criteria due to conservative measures, discharged plan to SNF.   Navigator available for reassessment of patient.   Earnestine Leys, BSN, Clinical cytogeneticist Only

## 2022-09-13 NOTE — Progress Notes (Signed)
Patient is medically stable for discharge, pending PASRR. No chest pain or dyspnea, no edema   BP (!) 117/53 (BP Location: Right Arm)   Pulse 68   Temp 97.6 F (36.4 C) (Oral)   Resp 17   Ht 5' (1.524 m)   Wt 40.2 kg   SpO2 92%   BMI 17.31 kg/m   Neurology awake and alert ENT with mild pallor Cardiovascular with S1 and S2 present and rhythmic Respiratory with no wheezing or rales Abdomen with no distention  No lower extremity edema   Acute metabolic encephalopathy, heart failure and renal failure  Patient is medically stable for discharge I spoke with patient's son at the bedside, we talked in detail about patient's condition, plan of care and prognosis and all questions were addressed.

## 2022-09-14 DIAGNOSIS — R4182 Altered mental status, unspecified: Secondary | ICD-10-CM | POA: Diagnosis not present

## 2022-09-14 DIAGNOSIS — I5023 Acute on chronic systolic (congestive) heart failure: Secondary | ICD-10-CM | POA: Diagnosis not present

## 2022-09-14 DIAGNOSIS — N179 Acute kidney failure, unspecified: Secondary | ICD-10-CM | POA: Diagnosis not present

## 2022-09-14 NOTE — TOC Progression Note (Signed)
Transition of Care [facility]) - Progression Note    Patient Details  Name: Carol [name] MRN: [id number] Date of Birth: [birth year]  Transition of Care [facility]) CM/SW Contact  Reece Agar, [place] Phone Number: 09/14/2022, 3:59 PM  Clinical Narrative:    CSW spoke with Star at [facility], she is not able to accept pt today but she will be able to accept pt tomorrow.         Expected Discharge Plan and Services           Expected Discharge Date: 09/14/22                                     Social Determinants of Health (SDOH) Interventions    Readmission Risk Interventions     No data to display

## 2022-09-14 NOTE — Progress Notes (Signed)
Physical Therapy Treatment Patient Details Name: Carol [name] MRN: [id number] DOB: 02/24/1936 Today's Date: 09/14/2022   History of Present Illness 86 year old female who presents after being found unresponsive at home overnight in morning of 11/12. Daughter performed CPR briefly. Unclear if she truly lost pulses. She did have spontaneous circulation when EMS arrived. Pt with pna in April of this year and since then has grown progressively weak, has struggled with protein calorie malnutrition, and has about a 20 pound unintentional weight loss. PMH of COPD, HTN, hyponatremia, and recent admission for pneumonia in April of this year, Anxiety, Arthritis, Colitis, Complication of anesthesia, GERD, Headache, Hypertension, and Urinary tract infection (early aug 2014).    PT Comments    Pt was seen for therapy today and note her tolerance for gait is good, encouraged PT to walk her farther.  Pt is maintaining breath control and walking on 2L with comfort, as well as safely with min guard assist.  Pt is expecting to go to rehab at SNF with family support, but pt may be at an ALF level at DC.  Even so she is willing to work and should make good progress with therapy.  Continue to monitor for cognitive issues in rehab at hospital, recommend pt to get SNF care with assist for mobility needed with all tasks.       Recommendations for follow up therapy are one component of a multi-disciplinary discharge planning process, led by the attending physician.  Recommendations may be updated based on patient status, additional functional criteria and insurance authorization.  Follow Up Recommendations  Skilled nursing-short term rehab (<3 hours/day) Can patient physically be transported by private vehicle: Yes   Assistance Recommended at Discharge Frequent or constant Supervision/Assistance  Patient can return home with the following A little help with walking and/or transfers;A little help with  bathing/dressing/bathroom;Assistance with cooking/housework;Assist for transportation;Help with stairs or ramp for entrance   Equipment Recommendations  None recommended by PT    Recommendations for Other Services       Precautions / Restrictions Precautions Precautions: Fall Precaution Comments: Watch O2 and HR Restrictions Weight Bearing Restrictions: No     Mobility  Bed Mobility Overal bed mobility: Needs Assistance Bed Mobility: Supine to Sit     Supine to sit: Min guard Sit to supine: Min guard        Transfers Overall transfer level: Needs assistance Equipment used: Rolling walker (2 wheels) Transfers: Sit to/from Stand Sit to Stand: Min guard                Ambulation/Gait Ambulation/Gait assistance: Counsellor (Feet): 150 Feet Assistive device: Rolling walker (2 wheels) Gait Pattern/deviations: Step-through pattern, Decreased stride length, Wide base of support Gait velocity: decreased Gait velocity interpretation: <1.31 ft/sec, indicative of household ambulator Pre-gait activities: standing balance ck General Gait Details: tends to laterally become unstable but is noted to easily be assisted to regain balance   Stairs             Wheelchair Mobility    Modified Rankin (Stroke Patients Only)       Balance Overall balance assessment: Needs assistance Sitting-balance support: Feet supported Sitting balance-Leahy Scale: Good     Standing balance support: Bilateral upper extremity supported, During functional activity Standing balance-Leahy Scale: Fair Standing balance comment: less than fair dynamically  Cognition Arousal/Alertness: Awake/alert Behavior During Therapy: WFL for tasks assessed/performed Overall Cognitive Status: Impaired/Different from baseline Area of Impairment: Awareness, Safety/judgement, Problem solving                 Orientation Level:  Situation Current Attention Level: Selective Memory: Decreased short-term memory Following Commands: Follows one step commands inconsistently, Follows one step commands with increased time Safety/Judgement: Decreased awareness of deficits, Decreased awareness of safety Awareness: Intellectual Problem Solving: Requires verbal cues, Requires tactile cues          Exercises      General Comments General comments (skin integrity, edema, etc.): pt is up to walk with O2 tank, slow progression with minor LOB and easily corrected.  Pt is expecting to go to SNF, but family is also very involved      Pertinent Vitals/Pain Pain Assessment Pain Assessment: Faces Faces Pain Scale: No hurt    Home Living                          Prior Function            PT Goals (current goals can now be found in the care plan section) Acute Rehab PT Goals Patient Stated Goal: to go home Progress towards PT goals: Progressing toward goals    Frequency    Min 2X/week      PT Plan Current plan remains appropriate    Co-evaluation              AM-PAC PT "6 Clicks" Mobility   Outcome Measure  Help needed turning from your back to your side while in a flat bed without using bedrails?: A Little Help needed moving from lying on your back to sitting on the side of a flat bed without using bedrails?: A Little Help needed moving to and from a bed to a chair (including a wheelchair)?: A Little Help needed standing up from a chair using your arms (e.g., wheelchair or bedside chair)?: A Little Help needed to walk in hospital room?: A Little Help needed climbing 3-5 steps with a railing? : A Lot 6 Click Score: 17    End of Session Equipment Utilized During Treatment: Gait belt;Oxygen Activity Tolerance: Patient tolerated treatment well Patient left: with call bell/phone within reach;in chair;with chair alarm set Nurse Communication: Mobility status PT Visit Diagnosis: Muscle  weakness (generalized) (M62.81);Difficulty in walking, not elsewhere classified (R26.2)     Time: 9924-2683 PT Time Calculation (min) (ACUTE ONLY): 26 min  Charges:  $Gait Training: 8-22 mins $Therapeutic Activity: 8-22 mins   Ramond Dial 09/14/2022, 4:29 PM  Mee Hives, PT PhD Acute Rehab Dept. Number: [place] [facility] [place]

## 2022-09-14 NOTE — Plan of Care (Signed)
  Problem: Pain Managment: Goal: General experience of comfort will improve Outcome: Progressing   Problem: Safety: Goal: Ability to remain free from injury will improve Outcome: Progressing   Problem: Skin Integrity: Goal: Risk for impaired skin integrity will decrease Outcome: Progressing   

## 2022-09-14 NOTE — Progress Notes (Signed)
Patient is feeling well, no dyspnea or chest pain   BP (!) 112/50 (BP Location: Right Arm)   Pulse 98   Temp 97.7 F (36.5 C) (Oral)   Resp 17   Ht 5' (1.524 m)   Wt 37.8 kg   SpO2 92%   BMI 16.28 kg/m   Patient awake and alert ENT with mild pallor Cardiovascular with S1 and S2 present and rhythmic Respiratory with no rales Abdomen with no distention No lower extremity edema   1 Heart failure  Patient medically stable for discharge.

## 2022-09-15 DIAGNOSIS — I429 Cardiomyopathy, unspecified: Secondary | ICD-10-CM | POA: Diagnosis not present

## 2022-09-15 DIAGNOSIS — R627 Adult failure to thrive: Secondary | ICD-10-CM | POA: Diagnosis not present

## 2022-09-15 DIAGNOSIS — R1310 Dysphagia, unspecified: Secondary | ICD-10-CM | POA: Diagnosis not present

## 2022-09-15 DIAGNOSIS — R2689 Other abnormalities of gait and mobility: Secondary | ICD-10-CM | POA: Diagnosis not present

## 2022-09-15 DIAGNOSIS — Z09 Encounter for follow-up examination after completed treatment for conditions other than malignant neoplasm: Secondary | ICD-10-CM | POA: Diagnosis not present

## 2022-09-15 DIAGNOSIS — Z7984 Long term (current) use of oral hypoglycemic drugs: Secondary | ICD-10-CM | POA: Diagnosis not present

## 2022-09-15 DIAGNOSIS — Z7982 Long term (current) use of aspirin: Secondary | ICD-10-CM | POA: Diagnosis not present

## 2022-09-15 DIAGNOSIS — R5381 Other malaise: Secondary | ICD-10-CM | POA: Diagnosis not present

## 2022-09-15 DIAGNOSIS — Z87448 Personal history of other diseases of urinary system: Secondary | ICD-10-CM | POA: Diagnosis not present

## 2022-09-15 DIAGNOSIS — E43 Unspecified severe protein-calorie malnutrition: Secondary | ICD-10-CM | POA: Diagnosis not present

## 2022-09-15 DIAGNOSIS — M6259 Muscle wasting and atrophy, not elsewhere classified, multiple sites: Secondary | ICD-10-CM | POA: Diagnosis not present

## 2022-09-15 DIAGNOSIS — I5041 Acute combined systolic (congestive) and diastolic (congestive) heart failure: Secondary | ICD-10-CM | POA: Diagnosis not present

## 2022-09-15 DIAGNOSIS — Z741 Need for assistance with personal care: Secondary | ICD-10-CM | POA: Diagnosis not present

## 2022-09-15 DIAGNOSIS — Z8669 Personal history of other diseases of the nervous system and sense organs: Secondary | ICD-10-CM | POA: Diagnosis not present

## 2022-09-15 DIAGNOSIS — Z8661 Personal history of infections of the central nervous system: Secondary | ICD-10-CM | POA: Diagnosis not present

## 2022-09-15 DIAGNOSIS — I2510 Atherosclerotic heart disease of native coronary artery without angina pectoris: Secondary | ICD-10-CM | POA: Diagnosis not present

## 2022-09-15 DIAGNOSIS — J449 Chronic obstructive pulmonary disease, unspecified: Secondary | ICD-10-CM | POA: Diagnosis not present

## 2022-09-15 DIAGNOSIS — R41841 Cognitive communication deficit: Secondary | ICD-10-CM | POA: Diagnosis not present

## 2022-09-15 DIAGNOSIS — I5022 Chronic systolic (congestive) heart failure: Secondary | ICD-10-CM | POA: Diagnosis not present

## 2022-09-15 DIAGNOSIS — Z681 Body mass index (BMI) 19 or less, adult: Secondary | ICD-10-CM | POA: Diagnosis not present

## 2022-09-15 DIAGNOSIS — R531 Weakness: Secondary | ICD-10-CM | POA: Diagnosis not present

## 2022-09-15 DIAGNOSIS — E785 Hyperlipidemia, unspecified: Secondary | ICD-10-CM | POA: Diagnosis not present

## 2022-09-15 DIAGNOSIS — R4182 Altered mental status, unspecified: Secondary | ICD-10-CM | POA: Diagnosis not present

## 2022-09-15 DIAGNOSIS — F39 Unspecified mood [affective] disorder: Secondary | ICD-10-CM | POA: Diagnosis not present

## 2022-09-15 DIAGNOSIS — G9341 Metabolic encephalopathy: Secondary | ICD-10-CM | POA: Diagnosis not present

## 2022-09-15 DIAGNOSIS — F17200 Nicotine dependence, unspecified, uncomplicated: Secondary | ICD-10-CM | POA: Diagnosis not present

## 2022-09-15 DIAGNOSIS — M6281 Muscle weakness (generalized): Secondary | ICD-10-CM | POA: Diagnosis not present

## 2022-09-15 DIAGNOSIS — Z79899 Other long term (current) drug therapy: Secondary | ICD-10-CM | POA: Diagnosis not present

## 2022-09-15 MED ORDER — POLYETHYLENE GLYCOL 3350 17 G PO PACK: 17.0000 g | PACK | Freq: Every day | ORAL | 0 refills | Status: AC

## 2022-09-15 MED ORDER — POLYETHYLENE GLYCOL 3350 17 G PO PACK: 17.0000 g | PACK | Freq: Every day | ORAL | Status: DC

## 2022-09-15 NOTE — Progress Notes (Signed)
Patient is feeling well, she is out of the bed into the chair, her daughter is at the bedside. No chest pain or dyspnea, no edema.   BP (!) 99/57 (BP Location: Left Arm)   Pulse 67   Temp 98.5 F (36.9 C) (Oral)   Resp 18   Ht 5' (1.524 m)   Wt 39.7 kg   SpO2 98%   BMI 17.09 kg/m   Patient is medically stable to be discharge, I have contacted social services and Mrs. Cotterill will be discharged to SNF today.

## 2022-09-15 NOTE — Progress Notes (Signed)
Patient discharged to [facility], discharge papers was given to her daughter who will transport her to the SNF, report wa also called in to [place] at [facility].

## 2022-09-15 NOTE — TOC Transition Note (Signed)
Transition of Care [facility]) - CM/SW Discharge Note   Patient Details  Name: Carol [name] MRN: [id number] Date of Birth: [birth year]  Transition of Care [facility]) CM/SW Contact:  Emeterio Reeve, LCSW Phone Number: 09/15/2022, 10:49 AM   Clinical Narrative:     Per MD patient ready for DC to [facility]. RN, patient, patient's family, and facility notified of DC. Discharge Summary and FL2 sent to facility. DC packet on chart. Pt will be transported by her daughter Kennyth Lose by private car.   RN to call report to [phone].  CSW will sign off for now as social work intervention is no longer needed. Please consult [place] again if new needs arise.   Final next level of care: Skilled Nursing Facility Barriers to Discharge: Barriers Resolved   Patient Goals and CMS Choice        Discharge Placement              Patient chooses bed at: [facility] Patient to be transferred to facility by: Daughter Jackie's car Name of family member notified: Kennyth Lose, daughter Patient and family notified of of transfer: 09/15/22  Discharge Plan and Services                                     Social Determinants of Health (SDOH) Interventions     Readmission Risk Interventions     No data to display

## 2022-09-18 DIAGNOSIS — Z681 Body mass index (BMI) 19 or less, adult: Secondary | ICD-10-CM | POA: Diagnosis not present

## 2022-09-18 DIAGNOSIS — E43 Unspecified severe protein-calorie malnutrition: Secondary | ICD-10-CM | POA: Diagnosis not present

## 2022-09-18 DIAGNOSIS — I5022 Chronic systolic (congestive) heart failure: Secondary | ICD-10-CM | POA: Diagnosis not present

## 2022-09-18 DIAGNOSIS — R1310 Dysphagia, unspecified: Secondary | ICD-10-CM | POA: Diagnosis not present

## 2022-09-18 DIAGNOSIS — F39 Unspecified mood [affective] disorder: Secondary | ICD-10-CM | POA: Diagnosis not present

## 2022-09-18 DIAGNOSIS — F17200 Nicotine dependence, unspecified, uncomplicated: Secondary | ICD-10-CM | POA: Diagnosis not present

## 2022-09-18 DIAGNOSIS — J449 Chronic obstructive pulmonary disease, unspecified: Secondary | ICD-10-CM | POA: Diagnosis not present

## 2022-09-18 DIAGNOSIS — E785 Hyperlipidemia, unspecified: Secondary | ICD-10-CM | POA: Diagnosis not present

## 2022-09-18 DIAGNOSIS — Z87448 Personal history of other diseases of urinary system: Secondary | ICD-10-CM | POA: Diagnosis not present

## 2022-09-18 DIAGNOSIS — I2510 Atherosclerotic heart disease of native coronary artery without angina pectoris: Secondary | ICD-10-CM | POA: Diagnosis not present

## 2022-09-18 DIAGNOSIS — Z79899 Other long term (current) drug therapy: Secondary | ICD-10-CM | POA: Diagnosis not present

## 2022-09-18 DIAGNOSIS — Z09 Encounter for follow-up examination after completed treatment for conditions other than malignant neoplasm: Secondary | ICD-10-CM | POA: Diagnosis not present

## 2022-09-18 DIAGNOSIS — Z8661 Personal history of infections of the central nervous system: Secondary | ICD-10-CM | POA: Diagnosis not present

## 2022-09-19 DIAGNOSIS — J449 Chronic obstructive pulmonary disease, unspecified: Secondary | ICD-10-CM | POA: Diagnosis not present

## 2022-09-19 DIAGNOSIS — R531 Weakness: Secondary | ICD-10-CM | POA: Diagnosis not present

## 2022-09-19 DIAGNOSIS — M6281 Muscle weakness (generalized): Secondary | ICD-10-CM | POA: Diagnosis not present

## 2022-09-19 DIAGNOSIS — R5381 Other malaise: Secondary | ICD-10-CM | POA: Diagnosis not present

## 2022-09-19 DIAGNOSIS — I429 Cardiomyopathy, unspecified: Secondary | ICD-10-CM | POA: Diagnosis not present

## 2022-09-19 DIAGNOSIS — E43 Unspecified severe protein-calorie malnutrition: Secondary | ICD-10-CM | POA: Diagnosis not present

## 2022-09-19 DIAGNOSIS — R627 Adult failure to thrive: Secondary | ICD-10-CM | POA: Diagnosis not present

## 2022-09-19 DIAGNOSIS — I5041 Acute combined systolic (congestive) and diastolic (congestive) heart failure: Secondary | ICD-10-CM | POA: Diagnosis not present

## 2022-09-20 ENCOUNTER — Other Ambulatory Visit: Payer: Self-pay | Admitting: *Deleted

## 2022-09-20 DIAGNOSIS — E43 Unspecified severe protein-calorie malnutrition: Secondary | ICD-10-CM | POA: Diagnosis not present

## 2022-09-20 DIAGNOSIS — J449 Chronic obstructive pulmonary disease, unspecified: Secondary | ICD-10-CM | POA: Diagnosis not present

## 2022-09-20 DIAGNOSIS — R5381 Other malaise: Secondary | ICD-10-CM | POA: Diagnosis not present

## 2022-09-20 DIAGNOSIS — I429 Cardiomyopathy, unspecified: Secondary | ICD-10-CM | POA: Diagnosis not present

## 2022-09-20 DIAGNOSIS — R531 Weakness: Secondary | ICD-10-CM | POA: Diagnosis not present

## 2022-09-20 DIAGNOSIS — M6281 Muscle weakness (generalized): Secondary | ICD-10-CM | POA: Diagnosis not present

## 2022-09-20 DIAGNOSIS — I5041 Acute combined systolic (congestive) and diastolic (congestive) heart failure: Secondary | ICD-10-CM | POA: Diagnosis not present

## 2022-09-20 DIAGNOSIS — R627 Adult failure to thrive: Secondary | ICD-10-CM | POA: Diagnosis not present

## 2022-09-20 NOTE — Patient Outreach (Signed)
Carol [name] resides in [facility]. Recently admitted on 09/15/22.  Facility site visit to [facility]. Met with [facility] social workers and Marketing executive. Carol [name] is going home on tomorrow, Friday 09/21/22.  Will notify [facility] care management upon SNF discharge.   PCP office [facility] has [facility] care management services.    Marthenia Rolling, MSN, RN,BSN [place] Acute Care Coordinator [phone] (Direct dial)

## 2022-09-21 DIAGNOSIS — J449 Chronic obstructive pulmonary disease, unspecified: Secondary | ICD-10-CM | POA: Diagnosis not present

## 2022-09-21 DIAGNOSIS — Z7982 Long term (current) use of aspirin: Secondary | ICD-10-CM | POA: Diagnosis not present

## 2022-09-21 DIAGNOSIS — F17200 Nicotine dependence, unspecified, uncomplicated: Secondary | ICD-10-CM | POA: Diagnosis not present

## 2022-09-21 DIAGNOSIS — Z09 Encounter for follow-up examination after completed treatment for conditions other than malignant neoplasm: Secondary | ICD-10-CM | POA: Diagnosis not present

## 2022-09-21 DIAGNOSIS — Z8669 Personal history of other diseases of the nervous system and sense organs: Secondary | ICD-10-CM | POA: Diagnosis not present

## 2022-09-21 DIAGNOSIS — I5022 Chronic systolic (congestive) heart failure: Secondary | ICD-10-CM | POA: Diagnosis not present

## 2022-09-21 DIAGNOSIS — I2510 Atherosclerotic heart disease of native coronary artery without angina pectoris: Secondary | ICD-10-CM | POA: Diagnosis not present

## 2022-09-21 DIAGNOSIS — Z7984 Long term (current) use of oral hypoglycemic drugs: Secondary | ICD-10-CM | POA: Diagnosis not present

## 2022-09-24 ENCOUNTER — Other Ambulatory Visit: Payer: Self-pay

## 2022-09-24 MED ORDER — IVABRADINE HCL 5 MG PO TABS: 5.0000 mg | ORAL_TABLET | Freq: Two times a day (BID) | ORAL | 3 refills | Status: DC

## 2022-09-26 ENCOUNTER — Encounter: Payer: Self-pay | Admitting: Internal Medicine

## 2022-09-26 ENCOUNTER — Telehealth: Payer: Self-pay | Admitting: Pulmonary Disease

## 2022-09-26 ENCOUNTER — Ambulatory Visit: Payer: Medicare Other | Admitting: Internal Medicine

## 2022-09-26 VITALS — BP 143/66 | HR 55 | Ht 60.0 in | Wt 86.4 lb

## 2022-09-26 DIAGNOSIS — I10 Essential (primary) hypertension: Secondary | ICD-10-CM

## 2022-09-26 DIAGNOSIS — Z681 Body mass index (BMI) 19 or less, adult: Secondary | ICD-10-CM | POA: Diagnosis not present

## 2022-09-26 DIAGNOSIS — E871 Hypo-osmolality and hyponatremia: Secondary | ICD-10-CM | POA: Diagnosis not present

## 2022-09-26 DIAGNOSIS — J449 Chronic obstructive pulmonary disease, unspecified: Secondary | ICD-10-CM | POA: Diagnosis not present

## 2022-09-26 DIAGNOSIS — Z9981 Dependence on supplemental oxygen: Secondary | ICD-10-CM | POA: Diagnosis not present

## 2022-09-26 DIAGNOSIS — I5020 Unspecified systolic (congestive) heart failure: Secondary | ICD-10-CM

## 2022-09-26 DIAGNOSIS — Z79810 Long term (current) use of selective estrogen receptor modulators (SERMs): Secondary | ICD-10-CM | POA: Diagnosis not present

## 2022-09-26 DIAGNOSIS — E785 Hyperlipidemia, unspecified: Secondary | ICD-10-CM

## 2022-09-26 DIAGNOSIS — J441 Chronic obstructive pulmonary disease with (acute) exacerbation: Secondary | ICD-10-CM

## 2022-09-26 DIAGNOSIS — Z7951 Long term (current) use of inhaled steroids: Secondary | ICD-10-CM | POA: Diagnosis not present

## 2022-09-26 DIAGNOSIS — F419 Anxiety disorder, unspecified: Secondary | ICD-10-CM | POA: Diagnosis not present

## 2022-09-26 DIAGNOSIS — Z8701 Personal history of pneumonia (recurrent): Secondary | ICD-10-CM | POA: Diagnosis not present

## 2022-09-26 DIAGNOSIS — E876 Hypokalemia: Secondary | ICD-10-CM | POA: Diagnosis not present

## 2022-09-26 DIAGNOSIS — I110 Hypertensive heart disease with heart failure: Secondary | ICD-10-CM | POA: Diagnosis not present

## 2022-09-26 DIAGNOSIS — Z7982 Long term (current) use of aspirin: Secondary | ICD-10-CM | POA: Diagnosis not present

## 2022-09-26 DIAGNOSIS — K219 Gastro-esophageal reflux disease without esophagitis: Secondary | ICD-10-CM | POA: Diagnosis not present

## 2022-09-26 DIAGNOSIS — I501 Left ventricular failure: Secondary | ICD-10-CM | POA: Diagnosis not present

## 2022-09-26 DIAGNOSIS — M1990 Unspecified osteoarthritis, unspecified site: Secondary | ICD-10-CM | POA: Diagnosis not present

## 2022-09-26 DIAGNOSIS — E46 Unspecified protein-calorie malnutrition: Secondary | ICD-10-CM | POA: Diagnosis not present

## 2022-09-26 DIAGNOSIS — Z9181 History of falling: Secondary | ICD-10-CM | POA: Diagnosis not present

## 2022-09-26 DIAGNOSIS — R627 Adult failure to thrive: Secondary | ICD-10-CM | POA: Diagnosis not present

## 2022-09-26 DIAGNOSIS — K529 Noninfective gastroenteritis and colitis, unspecified: Secondary | ICD-10-CM | POA: Diagnosis not present

## 2022-09-26 DIAGNOSIS — I959 Hypotension, unspecified: Secondary | ICD-10-CM | POA: Diagnosis not present

## 2022-09-26 DIAGNOSIS — F17210 Nicotine dependence, cigarettes, uncomplicated: Secondary | ICD-10-CM | POA: Diagnosis not present

## 2022-09-26 DIAGNOSIS — I429 Cardiomyopathy, unspecified: Secondary | ICD-10-CM | POA: Diagnosis not present

## 2022-09-26 NOTE — Progress Notes (Signed)
Primary Physician/Referring:  Shirline Frees, MD  Patient ID: Carol [name], female    DOB: 07-Aug-1936, 86 y.o.   MRN: [id number]  Chief Complaint  Patient presents with   Hypotension   Hospitalization Follow-up   Coronary Artery Disease   HPI:    Carol [name]  is a 86 y.o. female with past medical history significant for hypertension, hyperlipidemia, and newly diagnosed heart failure with reduced ejection fraction who is here for a hospital follow-up.  Patient is establishing care with cardiology today after she was just discharged from the hospital about 2 weeks ago.  She initially was admitted due to an accidental benzodiazepine overdose, however, with more testing she was found to have newly reduced ejection fraction.  Patient opted against invasive procedures such as cardiac catheterization and stent placement her daughter is here at the visit today, they would like to try medical therapy first.  They are agreeable to repeating echocardiogram in 3 months and then reassessing options at that time.  Patient has been doing pretty well since she was discharged from the hospital.  She is taking all of her medications without side effects.  She denies chest pain, shortness of breath, palpitations, diaphoresis, syncope, orthopnea, edema, PND, claudication.  Past Medical History:  Diagnosis Date   Anxiety    Arthritis    arthritis -hips   Colitis    Complication of anesthesia    B/P dropped post colonoscopy x1   GERD (gastroesophageal reflux disease)    Headache(784.0)    migraines rare occ.   Hypertension    Urinary tract infection early aug 2014   Past Surgical History:  Procedure Laterality Date   CATARACT EXTRACTION, BILATERAL Bilateral    COLONOSCOPY WITH PROPOFOL N/A 08/03/2013   Procedure: COLONOSCOPY WITH PROPOFOL;  Surgeon: Winfield Cunas., MD;  Location: [facility];  Service: Endoscopy;  Laterality: N/A;  ultra thin colon scope   COLONOSCOPY WITH PROPOFOL N/A  02/04/2018   Procedure: COLONOSCOPY WITH PROPOFOL;  Surgeon: Laurence Spates, MD;  Location: [facility];  Service: Endoscopy;  Laterality: N/A;   LASIK     No family history on file.  Social History   Tobacco Use   Smoking status: Every Day    Packs/day: 0.25    Years: 40.00    Total pack years: 10.00    Types: Cigarettes   Smokeless tobacco: Never   Tobacco comments:    Pt states she smoke 3 ciggs at most daily. 09/03/22 LW  Substance Use Topics   Alcohol use: No   Marital Status: Widowed  ROS  Review of Systems  Neurological:  Positive for weakness.   Objective  Blood pressure (!) 143/66, pulse (!) 55, height 5' (1.524 m), weight 86 lb 6.4 oz (39.2 kg), SpO2 92 %. Body mass index is 16.87 kg/m.     09/26/2022    1:49 PM 09/15/2022    8:10 AM 09/15/2022    5:08 AM  Vitals with BMI  Height '5\' 0"'$     Weight 86 lbs 6 oz    BMI 18.84    Systolic 166 99 063  Diastolic 66 57 61  Pulse 55 67 82    General: Appears older than stated age, cachectic, hemodynamically stable, no acute distress HEENT: Moist mucous membranes, no JVP, trachea midline, no carotid bruits, [place] oxygen.  Lungs: Clear to auscultation bilaterally.  No wheezes rales or rhonchi's. Heart: regular, positive S1-S2, no murmurs rubs or gallops appreciated Abdomen: Cachectic, soft, nontender, nondistended, positive  bowel sounds in all 4 quadrants Extremities: No pitting edema, 2+ DP and PT pulses, warm to touch Neuro: Alert to person, place, time, and situation.  Nonfocal. Psych: Normal mood and affect normal behavior.  Cooperative  Medications and allergies   Allergies  Allergen Reactions   Ciprofloxacin Nausea Only and Other (See Comments)    Nausea and legs weak   Nitrofuran Derivatives Nausea Only and Other (See Comments)    nitrofuratonin bad nausea, legs weak   Paxil [Paroxetine] Other (See Comments)    Sick on stomach   Penicillins Other (See Comments)    Sick on stomach   Sulfa Antibiotics Other  (See Comments)    jittery     Medication list after today's encounter   Current Outpatient Medications:    acetaminophen (TYLENOL) 650 MG CR tablet, Take 1,300 mg by mouth daily as needed for pain. , Disp: , Rfl:    aspirin 81 MG chewable tablet, Chew 1 tablet (81 mg total) by mouth daily., Disp: 30 tablet, Rfl: 0   empagliflozin (JARDIANCE) 10 MG TABS tablet, Take 1 tablet (10 mg total) by mouth daily., Disp: 30 tablet, Rfl: 0   feeding supplement (ENSURE ENLIVE / ENSURE PLUS) LIQD, Take 237 mLs by mouth 2 (two) times daily between meals., Disp: 14220 mL, Rfl: 0   Fluticasone-Umeclidin-Vilant (TRELEGY ELLIPTA) 100-62.5-25 MCG/ACT AEPB, Inhale 28 each into the lungs daily., Disp: 1 each, Rfl: 0   furosemide (LASIX) 20 MG tablet, Take 1 tablet (20 mg total) by mouth daily as needed for edema or fluid (weihgt gain 2 to 3 lbs in 24 hrs or 5 lbs in 7 days.)., Disp: 30 tablet, Rfl: 0   ivabradine (CORLANOR) 5 MG TABS tablet, Take 1 tablet (5 mg total) by mouth 2 (two) times daily with a meal., Disp: 180 tablet, Rfl: 3   LORazepam (ATIVAN) 1 MG tablet, Take 1 tablet (1 mg total) by mouth daily at 8 pm., Disp: 10 tablet, Rfl: 0   losartan (COZAAR) 25 MG tablet, Take 1 tablet (25 mg total) by mouth every evening., Disp: 30 tablet, Rfl: 0   metoprolol succinate (TOPROL-XL) 50 MG 24 hr tablet, Take 1 tablet (50 mg total) by mouth in the morning., Disp: 30 tablet, Rfl: 0   mirtazapine (REMERON) 15 MG tablet, Take 1 tablet (15 mg total) by mouth at bedtime., Disp: 30 tablet, Rfl: 0   Multiple Vitamin (MULTIVITAMIN WITH MINERALS) TABS tablet, Take 1 tablet by mouth daily., Disp: 30 tablet, Rfl: 0   pantoprazole (PROTONIX) 20 MG tablet, Take 1 tablet (20 mg total) by mouth daily. (Patient taking differently: Take 40 mg by mouth daily.), Disp: 30 tablet, Rfl: 0   polyethylene glycol (MIRALAX / GLYCOLAX) 17 g packet, Take 17 g by mouth daily., Disp: 14 each, Rfl: 0   raloxifene (EVISTA) 60 MG tablet, Take 60 mg  by mouth daily., Disp: , Rfl:    simvastatin (ZOCOR) 20 MG tablet, Take 20 mg by mouth at bedtime. , Disp: , Rfl:    spironolactone (ALDACTONE) 25 MG tablet, Take 0.5 tablets (12.5 mg total) by mouth daily., Disp: 15 tablet, Rfl: 0   venlafaxine XR (EFFEXOR-XR) 37.5 MG 24 hr capsule, Take 1 capsule (37.5 mg total) by mouth daily with breakfast., Disp: 30 capsule, Rfl: 0  Laboratory examination:   Lab Results  Component Value Date   NA 136 09/10/2022   K 3.5 09/10/2022   CO2 29 09/10/2022   GLUCOSE 73 09/10/2022   BUN 14 09/10/2022  CREATININE 0.60 09/10/2022   CALCIUM 9.2 09/10/2022   GFRNONAA >60 09/10/2022       Latest Ref Rng & Units 09/10/2022    2:22 AM 09/09/2022    5:45 AM 09/08/2022    6:19 AM  CMP  Glucose 70 - 99 mg/dL 73  85  102   BUN 8 - 23 mg/dL 14  8  <5   Creatinine 0.44 - 1.00 mg/dL 0.60  0.64  0.59   Sodium 135 - 145 mmol/L 136  137  134   Potassium 3.5 - 5.1 mmol/L 3.5  3.6  3.2   Chloride 98 - 111 mmol/L 93  95  94   CO2 22 - 32 mmol/L 29  32  34   Calcium 8.9 - 10.3 mg/dL 9.2  9.2  8.6   Total Protein 6.5 - 8.1 g/dL   5.6   Total Bilirubin 0.3 - 1.2 mg/dL   0.4   Alkaline Phos 38 - 126 U/L   42   AST 15 - 41 U/L   19   ALT 0 - 44 U/L   15       Latest Ref Rng & Units 09/08/2022    6:19 AM 09/07/2022    1:43 AM 09/06/2022    1:04 AM  CBC  WBC 4.0 - 10.5 K/uL 8.3  7.5  8.7   Hemoglobin 12.0 - 15.0 g/dL 12.5  11.3  11.1   Hematocrit 36.0 - 46.0 % 37.4  33.4  32.3   Platelets 150 - 400 K/uL 210  173  193     Lipid Panel Recent Labs    09/08/22 0619  CHOL 109  TRIG 49  LDLCALC 55  VLDL 10  HDL 44  CHOLHDL 2.5  LDLDIRECT 54    HEMOGLOBIN A1C No results found for: "HGBA1C", "MPG" TSH Recent Labs    03/16/22 2155 05/13/22 1859  TSH 1.194 1.292    External labs:     Radiology:    Cardiac Studies:     Echocardiogram: 09/06/2022: LVEF 20-25%, global hypokinesis, mild LVH, mild LAE, trivial MR, trivial pericardial  effusion.    EKG:   09/26/2022: Sinus Rhythm with PACs, LBBB and LAFB  09/07/2022: Sinus rhythm, left bundle branch block, 76 bpm, without underlying injury pattern.     Assessment     ICD-10-CM   1. Essential hypertension  I10 EKG 12-Lead    PCV ECHOCARDIOGRAM COMPLETE    2. HFrEF (heart failure with reduced ejection fraction) (HCC)  I50.20 EKG 12-Lead    PCV ECHOCARDIOGRAM COMPLETE    3. Hyperlipidemia LDL goal <70  E78.5        Orders Placed This Encounter  Procedures   EKG 12-Lead   PCV ECHOCARDIOGRAM COMPLETE    Standing Status:   Future    Standing Expiration Date:   09/27/2023    No orders of the defined types were placed in this encounter.   There are no discontinued medications.   Recommendations:   Carol [name] is a 86 y.o.  F with HFrEF, HTN, and HLD who is here for hospital follow-up   Essential hypertension Continue current cardiac medications. Encourage low-sodium diet, less than 2000 mg daily.   HFrEF (heart failure with reduced ejection fraction) (HCC) Continue current cardiac medications. Repeat echo in about 3 months, prior to follow-up with me During hospitalization, patient voices that she would like to continue medical therapy and avoid invasive procedures such as heart catheterization, stents, etc.  Patient was  DNR during her inpatient stay.   Hyperlipidemia LDL goal <70 Continue statin     Floydene Flock, DO, [facility]  09/26/2022, 2:47 PM Office: [phone] Pager: [phone]

## 2022-09-26 NOTE — Telephone Encounter (Signed)
pharmacy: [facility] on [address]

## 2022-10-01 ENCOUNTER — Telehealth: Payer: Self-pay | Admitting: Pulmonary Disease

## 2022-10-01 DIAGNOSIS — J9611 Chronic respiratory failure with hypoxia: Secondary | ICD-10-CM

## 2022-10-01 DIAGNOSIS — J449 Chronic obstructive pulmonary disease, unspecified: Secondary | ICD-10-CM

## 2022-10-01 MED ORDER — TRELEGY ELLIPTA 100-62.5-25 MCG/ACT IN AEPB: 28.0000 | INHALATION_SPRAY | Freq: Every day | RESPIRATORY_TRACT | 3 refills | Status: DC

## 2022-10-01 NOTE — Telephone Encounter (Signed)
Pt's daughter called stating that pt is needing an order for Trelegy inhaler to be sent to pharmacy. Confirmed preferred pharmacy and have sent Rx to pharmacy for pt. Pt also is needing to have an order sent to [employer] to have O2 equipment picked up as pt has switched DMEs from [employer] to [place]. Order has been placed to [employer] for them to come and pick up their equipment. Nothing further needed.

## 2022-10-05 DIAGNOSIS — I501 Left ventricular failure: Secondary | ICD-10-CM | POA: Diagnosis not present

## 2022-10-05 DIAGNOSIS — I110 Hypertensive heart disease with heart failure: Secondary | ICD-10-CM | POA: Diagnosis not present

## 2022-10-05 DIAGNOSIS — F419 Anxiety disorder, unspecified: Secondary | ICD-10-CM | POA: Diagnosis not present

## 2022-10-05 DIAGNOSIS — K219 Gastro-esophageal reflux disease without esophagitis: Secondary | ICD-10-CM | POA: Diagnosis not present

## 2022-10-05 DIAGNOSIS — F331 Major depressive disorder, recurrent, moderate: Secondary | ICD-10-CM | POA: Diagnosis not present

## 2022-10-05 DIAGNOSIS — F411 Generalized anxiety disorder: Secondary | ICD-10-CM | POA: Diagnosis not present

## 2022-10-05 DIAGNOSIS — M1990 Unspecified osteoarthritis, unspecified site: Secondary | ICD-10-CM | POA: Diagnosis not present

## 2022-10-05 DIAGNOSIS — J449 Chronic obstructive pulmonary disease, unspecified: Secondary | ICD-10-CM | POA: Diagnosis not present

## 2022-10-09 DIAGNOSIS — F419 Anxiety disorder, unspecified: Secondary | ICD-10-CM | POA: Diagnosis not present

## 2022-10-09 DIAGNOSIS — I255 Ischemic cardiomyopathy: Secondary | ICD-10-CM | POA: Diagnosis not present

## 2022-10-09 DIAGNOSIS — H9193 Unspecified hearing loss, bilateral: Secondary | ICD-10-CM | POA: Diagnosis not present

## 2022-10-09 DIAGNOSIS — I10 Essential (primary) hypertension: Secondary | ICD-10-CM | POA: Diagnosis not present

## 2022-10-09 DIAGNOSIS — I509 Heart failure, unspecified: Secondary | ICD-10-CM | POA: Diagnosis not present

## 2022-10-09 DIAGNOSIS — K219 Gastro-esophageal reflux disease without esophagitis: Secondary | ICD-10-CM | POA: Diagnosis not present

## 2022-10-09 DIAGNOSIS — J449 Chronic obstructive pulmonary disease, unspecified: Secondary | ICD-10-CM | POA: Diagnosis not present

## 2022-10-09 DIAGNOSIS — R627 Adult failure to thrive: Secondary | ICD-10-CM | POA: Diagnosis not present

## 2022-10-09 DIAGNOSIS — R001 Bradycardia, unspecified: Secondary | ICD-10-CM | POA: Diagnosis not present

## 2022-10-09 DIAGNOSIS — E7800 Pure hypercholesterolemia, unspecified: Secondary | ICD-10-CM | POA: Diagnosis not present

## 2022-10-09 DIAGNOSIS — F324 Major depressive disorder, single episode, in partial remission: Secondary | ICD-10-CM | POA: Diagnosis not present

## 2022-10-19 MED ORDER — LOSARTAN POTASSIUM 25 MG PO TABS: 25.0000 mg | ORAL_TABLET | Freq: Every evening | ORAL | 5 refills | Status: DC

## 2022-10-19 MED ORDER — EMPAGLIFLOZIN 10 MG PO TABS: 10.0000 mg | ORAL_TABLET | Freq: Every day | ORAL | 3 refills | Status: DC

## 2022-10-19 MED ORDER — METOPROLOL SUCCINATE ER 50 MG PO TB24: 50.0000 mg | ORAL_TABLET | Freq: Every morning | ORAL | 5 refills | Status: DC

## 2022-10-25 ENCOUNTER — Other Ambulatory Visit: Payer: Self-pay

## 2022-10-25 MED ORDER — METOPROLOL SUCCINATE ER 50 MG PO TB24: 50.0000 mg | ORAL_TABLET | Freq: Every morning | ORAL | 3 refills | Status: DC

## 2022-10-25 MED ORDER — SPIRONOLACTONE 25 MG PO TABS: 12.5000 mg | ORAL_TABLET | Freq: Every day | ORAL | 3 refills | Status: DC

## 2022-10-25 MED ORDER — IVABRADINE HCL 5 MG PO TABS: 5.0000 mg | ORAL_TABLET | Freq: Two times a day (BID) | ORAL | 3 refills | Status: DC

## 2022-10-25 MED ORDER — LOSARTAN POTASSIUM 25 MG PO TABS: 25.0000 mg | ORAL_TABLET | Freq: Every evening | ORAL | 3 refills | Status: DC

## 2022-10-25 MED ORDER — EMPAGLIFLOZIN 10 MG PO TABS: 10.0000 mg | ORAL_TABLET | Freq: Every day | ORAL | 3 refills | Status: DC

## 2022-11-01 ENCOUNTER — Other Ambulatory Visit: Payer: Self-pay | Admitting: *Deleted

## 2022-11-01 MED ORDER — TRELEGY ELLIPTA 100-62.5-25 MCG/ACT IN AEPB: 28.0000 | INHALATION_SPRAY | Freq: Every day | RESPIRATORY_TRACT | 3 refills | Status: DC

## 2022-11-02 ENCOUNTER — Encounter: Payer: Self-pay | Admitting: Nurse Practitioner

## 2022-11-02 ENCOUNTER — Ambulatory Visit (INDEPENDENT_AMBULATORY_CARE_PROVIDER_SITE_OTHER): Payer: Medicare Other | Admitting: Nurse Practitioner

## 2022-11-02 VITALS — BP 114/64 | HR 56 | Ht 61.0 in | Wt 83.8 lb

## 2022-11-02 DIAGNOSIS — I5020 Unspecified systolic (congestive) heart failure: Secondary | ICD-10-CM

## 2022-11-02 DIAGNOSIS — J9611 Chronic respiratory failure with hypoxia: Secondary | ICD-10-CM

## 2022-11-02 DIAGNOSIS — F411 Generalized anxiety disorder: Secondary | ICD-10-CM | POA: Diagnosis not present

## 2022-11-02 DIAGNOSIS — F331 Major depressive disorder, recurrent, moderate: Secondary | ICD-10-CM | POA: Diagnosis not present

## 2022-11-02 DIAGNOSIS — J449 Chronic obstructive pulmonary disease, unspecified: Secondary | ICD-10-CM | POA: Diagnosis not present

## 2022-11-02 DIAGNOSIS — E43 Unspecified severe protein-calorie malnutrition: Secondary | ICD-10-CM

## 2022-11-02 NOTE — Assessment & Plan Note (Signed)
Maintained on triple therapy regimen. Recovering from recent acute bronchitis type illness. Clinically improving. Target mucociliary clearance therapies with neb treatments and mucinex. I do think some of her congestion is related to sinus drainage as well. Recommended she start intranasal steroid, saline rinses and chlortab for drainage. Activity encouraged as tolerated.   Patient Instructions  Continue Trelegy 1 puff daily. Brush tongue and rinse mouth afterwards Continue Albuterol inhaler 2 puffs or 3 mL neb every 6 hours as needed for shortness of breath or wheezing. Notify if symptoms persist despite rescue inhaler/neb use.  Continue protonix 1 tab daily Continue claritin 1 tab daily Continue supplemental oxygen 3 lpm with activity for goal >88-90% and at night  Start fluticasone - 2 sprays each nostril daily for nasal congestion Can do saline nasal rinses 1-2 times a day for nasal congestion Chlorpheniramine 4 mg tablet over the counter At bedtime as needed for sinus drainage  Ensure drinks - 2-3 a day  Congrats on quitting smoking!!!   Follow up in 3 months with Dr. Elsworth Soho. If symptoms do not improve or worsen, please contact office for sooner follow up or seek emergency care.

## 2022-11-02 NOTE — Assessment & Plan Note (Signed)
She is working on weight gain. Eating well per her and her granddaughter's report. She understands dietary restrictions from a cardiac standpoint. She was using magic cups for supplementation but these are very expensive. She is going to start Ensure supplements 2-3 times a day instead.

## 2022-11-02 NOTE — Patient Instructions (Addendum)
Continue Trelegy 1 puff daily. Brush tongue and rinse mouth afterwards Continue Albuterol inhaler 2 puffs or 3 mL neb every 6 hours as needed for shortness of breath or wheezing. Notify if symptoms persist despite rescue inhaler/neb use.  Continue protonix 1 tab daily Continue claritin 1 tab daily Continue supplemental oxygen 3 lpm with activity for goal >88-90% and at night  Start fluticasone - 2 sprays each nostril daily for nasal congestion Can do saline nasal rinses 1-2 times a day for nasal congestion Chlorpheniramine 4 mg tablet over the counter At bedtime as needed for sinus drainage  Ensure drinks - 2-3 a day  Congrats on quitting smoking!!!   Follow up in 3 months with Dr. Elsworth Soho. If symptoms do not improve or worsen, please contact office for sooner follow up or seek emergency care.

## 2022-11-02 NOTE — Assessment & Plan Note (Signed)
Stable without increased O2 requirement. She uses oxygen with activity and at night. Instructed to monitor for goal >88-90%

## 2022-11-02 NOTE — Assessment & Plan Note (Addendum)
Significantly reduced EF from 08/2022. AMS during her hospitalization felt to be related to cardiogenic shock. She has recovered well. Euvolemic on exam. Follow up with cardiology as scheduled.

## 2022-11-02 NOTE — Progress Notes (Signed)
$'@Patient's$  ID: [place] A Cu[place], female    DOB: Oct 12, 1936, 86 y.o.   MRN: [id number]  Chief Complaint  Patient presents with   Follow-up    Was recently in the hospital for bronchitis, pt states breathing has improved and she is feeling better     Referring provider: Shirline Frees, MD  HPI: 86 year old female, former smoker followed for COPD and chronic respiratory failure. She is a patient of Dr. Bari Mantis and last seen in office 09/03/2022. Past medical history significant for HTN, cardiomyopathy, HFrEF, GERD, anxiety, HLD, severe protein-calorie malnutrition, depression.  TEST/EVENTS:   09/03/2022: OV with Dr. Elsworth Soho.  Admitted 02/2022 for COPD exacerbation and pneumonia.  Subpleural nodular densities were noted during this time.  She was treated with antibiotics and steroids.  She was discharged home on oxygen.  She has been compliant with her oxygen 24/7.  Prior to this she was only using it as needed.  She has lost significant amount of weight from 109 pounds 2 years ago to her current weight of 82 pounds.  Being treated for depression and severe anxiety with BuSpar, Effexor and lorazepam.  She had a lot of swallowing problems.  She has been trying nutritional supplements but weight has not increased.  Ordered esophagram for dysphagia.  No evidence of malignancy on recent CT scan given infiltrates have resolved.  COPD is treated with Advair and Incruse -switch to Trelegy.  She was also provided with nebulizer and albuterol nebs to use for rescue.  Smoking cessation emphasized.  Required 3 L of supplemental oxygen with walking oximetry.  Evaluate for POC.  11/02/2022: Today-follow-up Patient presents today for follow-up with her granddaughter, who helps take care of her manage her medicines.  After her last visit, she was found unresponsive at home by her daughter who administered CPR briefly and called EMS. Unclear if she truly lost pulses. When EMS arrived, patient had a pulse and was  transported to the ED, where she remained unresponsive. She was admitted from 09/06/2022 to 09/12/2022 for altered mental status. Urinalysis was positive for benzodiazepines, which she was on scheduled ativan. Etiology was unclear as initial workup was unremarkable for sepsis/infectious process, significant electrolyte imbalances, or profound hypoxia. She underwent echocardiogram which showed significant reduction in EF; 20-25%. Shock possibly related to medication and hypoperfusion in the setting of hypotension/cardiogenic shock. She was started on losartan, lasix, farxiga, metoprolol, and aldactone. She had gradual improvement and was discharged home on 10/18.  Since then, she has slowly improved. She's been trying to put weight back up, which she has gained about 6 lb back. Doesn't seem to be fluid as she doesn't have any leg swelling and no trouble with her breathing when compared to baseline. She has been eating magic cups at home but these were expensive so she hasn't gotten more since she ran out. Did recently have a bout of bronchitis. Was treated by her PCP with azithromycin. Feels improved today. Breathing is at her baseline. Cough has resolved. She's still having some chest congestion as well as postnasal drainage. Feels like a lot of her congestion comes from her sinuses. She does take claritin, which helps. Not doing any nasal sprays aside from saline. She denies fevers, chills, night sweats, decreased appetite, hemoptysis, orthopnea, palpitations, PND. She is eating well. She monitors her oxygen levels at home. Wears her oxygen with most activity and at night. Hasn't noticed any levels <88-90%. Using trelegy daily. She did quit smoking after her hospital stay.  Allergies  Allergen Reactions   Ciprofloxacin Nausea Only and Other (See Comments)    Nausea and legs weak   Nitrofuran Derivatives Nausea Only and Other (See Comments)    nitrofuratonin bad nausea, legs weak   Paxil [Paroxetine] Other  (See Comments)    Sick on stomach   Penicillins Other (See Comments)    Sick on stomach   Sulfa Antibiotics Other (See Comments)    jittery     There is no immunization history on file for this patient.  Past Medical History:  Diagnosis Date   Anxiety    Arthritis    arthritis -hips   Colitis    Complication of anesthesia    B/P dropped post colonoscopy x1   GERD (gastroesophageal reflux disease)    Headache(784.0)    migraines rare occ.   Hypertension    Urinary tract infection early aug 2014    Tobacco History: Social History   Tobacco Use  Smoking Status Former   Packs/day: 0.25   Years: 40.00   Total pack years: 10.00   Types: Cigarettes   Quit date: 09/05/2022   Years since quitting: 0.1  Smokeless Tobacco Never   Counseling given: Not Answered   Outpatient Medications Prior to Visit  Medication Sig Dispense Refill   acetaminophen (TYLENOL) 650 MG CR tablet Take 1,300 mg by mouth daily as needed for pain.      empagliflozin (JARDIANCE) 10 MG TABS tablet Take 1 tablet (10 mg total) by mouth daily. 90 tablet 3   Fluticasone-Umeclidin-Vilant (TRELEGY ELLIPTA) 100-62.5-25 MCG/ACT AEPB Inhale 28 each into the lungs daily. 180 each 3   furosemide (LASIX) 20 MG tablet Take 1 tablet (20 mg total) by mouth daily as needed for edema or fluid (weihgt gain 2 to 3 lbs in 24 hrs or 5 lbs in 7 days.). 30 tablet 0   ivabradine (CORLANOR) 5 MG TABS tablet Take 1 tablet (5 mg total) by mouth 2 (two) times daily with a meal. 180 tablet 3   LORazepam (ATIVAN) 1 MG tablet Take 1 tablet (1 mg total) by mouth daily at 8 pm. 10 tablet 0   losartan (COZAAR) 25 MG tablet Take 1 tablet (25 mg total) by mouth every evening. 90 tablet 3   metoprolol succinate (TOPROL-XL) 50 MG 24 hr tablet Take 1 tablet (50 mg total) by mouth in the morning. 90 tablet 3   pantoprazole (PROTONIX) 20 MG tablet Take 1 tablet (20 mg total) by mouth daily. (Patient taking differently: Take 40 mg by mouth  daily.) 30 tablet 0   polyethylene glycol (MIRALAX / GLYCOLAX) 17 g packet Take 17 g by mouth daily. 14 each 0   raloxifene (EVISTA) 60 MG tablet Take 60 mg by mouth daily.     simvastatin (ZOCOR) 20 MG tablet Take 20 mg by mouth at bedtime.      spironolactone (ALDACTONE) 25 MG tablet Take 0.5 tablets (12.5 mg total) by mouth daily. 45 tablet 3   mirtazapine (REMERON) 15 MG tablet Take 1 tablet (15 mg total) by mouth at bedtime. 30 tablet 0   venlafaxine XR (EFFEXOR-XR) 37.5 MG 24 hr capsule Take 1 capsule (37.5 mg total) by mouth daily with breakfast. 30 capsule 0   No facility-administered medications prior to visit.     Review of Systems:   Constitutional: No weight loss or gain, night sweats, fevers, chills +fatigue, lassitude (baseline; improved) HEENT: No headaches, difficulty swallowing, tooth/dental problems, or sore throat. No sneezing, itching, ear ache +nasal  congestion, post nasal drip CV:  No chest pain, orthopnea, PND, swelling in lower extremities, anasarca, dizziness, palpitations, syncope Resp: +shortness of breath with exertion (baseline); chest congestion (improved); minimal dry cough. No excess mucus or change in color of mucus. No hemoptysis. No wheezing.  No chest wall deformity GI:  No heartburn, indigestion, abdominal pain, nausea, vomiting, diarrhea, change in bowel habits, loss of appetite  MSK:  No joint pain or swelling.   Neuro: No dizziness or lightheadedness.  Psych: No depression or anxiety. Mood stable.     Physical Exam:  BP 114/64 (BP Location: Right Arm, Patient Position: Sitting, Cuff Size: Normal)   Pulse (!) 56   Ht '5\' 1"'$  (1.549 m)   Wt 83 lb 12.8 oz (38 kg)   SpO2 93%   BMI 15.83 kg/m   GEN: Pleasant, interactive, well-kempt; frail, elderly; in no acute distress. HEENT:  Normocephalic and atraumatic. PERRLA. Sclera white. Nasal turbinates erythematous, moist and patent bilaterally. Clear rhinorrhea present. Oropharynx pink and moist,  without exudate or edema. No lesions, ulcerations NECK:  Supple w/ fair ROM. No JVD present. Normal carotid impulses w/o bruits. Thyroid symmetrical with no goiter or nodules palpated. No lymphadenopathy.   CV: RRR, no m/r/g, no peripheral edema. Pulses intact, +2 bilaterally. No cyanosis, pallor or clubbing. PULMONARY:  Unlabored, regular breathing. Diminished bilaterally A&P w/o wheezes/rales/rhonchi. No accessory muscle use.  GI: BS present and normoactive. Soft, non-tender to palpation. No organomegaly or masses detected. MSK: No erythema, warmth or tenderness. Cap refil <2 sec all extrem. No deformities or joint swelling noted. Muscle wasting Neuro: A/Ox3. No focal deficits noted.   Skin: Warm, no lesions or rashe Psych: Normal affect and behavior. Judgement and thought content appropriate.     Lab Results:  CBC    Component Value Date/Time   WBC 8.3 09/08/2022 0619   RBC 3.71 (L) 09/08/2022 0619   HGB 12.5 09/08/2022 0619   HCT 37.4 09/08/2022 0619   HCT 42.2 04/24/2019 2052   PLT 210 09/08/2022 0619   MCV 100.8 (H) 09/08/2022 0619   MCH 33.7 09/08/2022 0619   MCHC 33.4 09/08/2022 0619   RDW 12.9 09/08/2022 0619   LYMPHSABS 1.4 09/08/2022 0619   MONOABS 0.7 09/08/2022 0619   EOSABS 0.1 09/08/2022 0619   BASOSABS 0.0 09/08/2022 0619    BMET    Component Value Date/Time   NA 136 09/10/2022 0222   K 3.5 09/10/2022 0222   CL 93 (L) 09/10/2022 0222   CO2 29 09/10/2022 0222   GLUCOSE 73 09/10/2022 0222   BUN 14 09/10/2022 0222   CREATININE 0.60 09/10/2022 0222   CALCIUM 9.2 09/10/2022 0222   GFRNONAA >60 09/10/2022 0222   GFRAA >60 04/27/2019 0501    BNP    Component Value Date/Time   BNP 546.1 (H) 09/10/2022 0222     Imaging:  No results found.        No data to display          No results found for: "NITRICOXIDE"      Assessment & Plan:   COPD (chronic obstructive pulmonary disease) ([place]): Probable Maintained on triple therapy regimen.  Recovering from recent acute bronchitis type illness. Clinically improving. Target mucociliary clearance therapies with neb treatments and mucinex. I do think some of her congestion is related to sinus drainage as well. Recommended she start intranasal steroid, saline rinses and chlortab for drainage. Activity encouraged as tolerated.   Patient Instructions  Continue Trelegy 1 puff daily. Brush tongue  and rinse mouth afterwards Continue Albuterol inhaler 2 puffs or 3 mL neb every 6 hours as needed for shortness of breath or wheezing. Notify if symptoms persist despite rescue inhaler/neb use.  Continue protonix 1 tab daily Continue claritin 1 tab daily Continue supplemental oxygen 3 lpm with activity for goal >88-90% and at night  Start fluticasone - 2 sprays each nostril daily for nasal congestion Can do saline nasal rinses 1-2 times a day for nasal congestion Chlorpheniramine 4 mg tablet over the counter At bedtime as needed for sinus drainage  Ensure drinks - 2-3 a day  Congrats on quitting smoking!!!   Follow up in 3 months with Dr. Elsworth Soho. If symptoms do not improve or worsen, please contact office for sooner follow up or seek emergency care.    Chronic respiratory failure with hypoxia (HCC) Stable without increased O2 requirement. She uses oxygen with activity and at night. Instructed to monitor for goal >88-90%  HFrEF (heart failure with reduced ejection fraction) ([place]) Significantly reduced EF from 08/2022. AMS during her hospitalization felt to be related to cardiogenic shock. She has recovered well. Euvolemic on exam. Follow up with cardiology as scheduled.   Protein-calorie malnutrition, severe She is working on weight gain. Eating well per her and her granddaughter's report. She understands dietary restrictions from a cardiac standpoint. She was using magic cups for supplementation but these are very expensive. She is going to start Ensure supplements 2-3 times a day instead.     I spent 35 minutes of dedicated to the care of this patient on the date of this encounter to include pre-visit review of records, face-to-face time with the patient discussing conditions above, post visit ordering of testing, clinical documentation with the electronic health record, making appropriate referrals as documented, and communicating necessary findings to members of the patients care team.  Clayton Bibles, NP 11/02/2022  Pt aware and understands NP's role.

## 2022-11-15 ENCOUNTER — Other Ambulatory Visit: Payer: Self-pay | Admitting: *Deleted

## 2022-11-15 MED ORDER — TRELEGY ELLIPTA 100-62.5-25 MCG/ACT IN AEPB: 1.0000 | INHALATION_SPRAY | Freq: Every day | RESPIRATORY_TRACT | 3 refills | Status: DC

## 2022-11-20 DIAGNOSIS — H353132 Nonexudative age-related macular degeneration, bilateral, intermediate dry stage: Secondary | ICD-10-CM | POA: Diagnosis not present

## 2022-11-30 DIAGNOSIS — F331 Major depressive disorder, recurrent, moderate: Secondary | ICD-10-CM | POA: Diagnosis not present

## 2022-11-30 DIAGNOSIS — F411 Generalized anxiety disorder: Secondary | ICD-10-CM | POA: Diagnosis not present

## 2022-12-04 ENCOUNTER — Other Ambulatory Visit: Payer: Self-pay | Admitting: Family Medicine

## 2022-12-04 DIAGNOSIS — Z1231 Encounter for screening mammogram for malignant neoplasm of breast: Secondary | ICD-10-CM

## 2022-12-25 DIAGNOSIS — F331 Major depressive disorder, recurrent, moderate: Secondary | ICD-10-CM | POA: Diagnosis not present

## 2022-12-25 DIAGNOSIS — F411 Generalized anxiety disorder: Secondary | ICD-10-CM | POA: Diagnosis not present

## 2022-12-27 ENCOUNTER — Ambulatory Visit: Payer: Medicare Other

## 2022-12-27 DIAGNOSIS — I10 Essential (primary) hypertension: Secondary | ICD-10-CM | POA: Diagnosis not present

## 2022-12-27 DIAGNOSIS — I5020 Unspecified systolic (congestive) heart failure: Secondary | ICD-10-CM

## 2022-12-31 NOTE — Progress Notes (Signed)
improved

## 2022-12-31 NOTE — Progress Notes (Signed)
Spoke with patient's daughter, gave her the results.

## 2023-01-01 ENCOUNTER — Ambulatory Visit: Payer: Medicare Other | Admitting: Internal Medicine

## 2023-01-02 ENCOUNTER — Encounter: Payer: Self-pay | Admitting: Internal Medicine

## 2023-01-02 ENCOUNTER — Ambulatory Visit: Payer: Medicare Other | Admitting: Internal Medicine

## 2023-01-02 VITALS — BP 133/58 | HR 69 | Resp 16 | Ht 61.0 in | Wt 91.0 lb

## 2023-01-02 DIAGNOSIS — I5020 Unspecified systolic (congestive) heart failure: Secondary | ICD-10-CM | POA: Diagnosis not present

## 2023-01-02 DIAGNOSIS — E785 Hyperlipidemia, unspecified: Secondary | ICD-10-CM | POA: Diagnosis not present

## 2023-01-02 DIAGNOSIS — I10 Essential (primary) hypertension: Secondary | ICD-10-CM

## 2023-01-02 NOTE — Progress Notes (Signed)
Primary Physician/Referring:  Shirline Frees, MD  Patient ID: Carol [name], female    DOB: 09-08-1936, 87 y.o.   MRN: [id number]  Chief Complaint  Patient presents with   Hypertension   Follow-up   HPI:    Carol [name]  is a 87 y.o. female with past medical history significant for hypertension, hyperlipidemia, and newly diagnosed heart failure with reduced ejection fraction who is here for a  follow-up.  Patient opted against invasive procedures such as cardiac catheterization and stent placement her daughter is here at the visit today, they would like to try medical therapy first. Patient has been doing very well since our last visit. Her appetite is back and she had gained some weight. She is taking all of her medications without side effects.  She denies chest pain, shortness of breath, palpitations, diaphoresis, syncope, orthopnea, edema, PND, claudication.  Past Medical History:  Diagnosis Date   Anxiety    Arthritis    arthritis -hips   Colitis    Complication of anesthesia    B/P dropped post colonoscopy x1   GERD (gastroesophageal reflux disease)    Headache(784.0)    migraines rare occ.   Hypertension    Urinary tract infection early aug 2014   Past Surgical History:  Procedure Laterality Date   CATARACT EXTRACTION, BILATERAL Bilateral    COLONOSCOPY WITH PROPOFOL N/A 08/03/2013   Procedure: COLONOSCOPY WITH PROPOFOL;  Surgeon: Winfield Cunas., MD;  Location: [facility];  Service: Endoscopy;  Laterality: N/A;  ultra thin colon scope   COLONOSCOPY WITH PROPOFOL N/A 02/04/2018   Procedure: COLONOSCOPY WITH PROPOFOL;  Surgeon: Laurence Spates, MD;  Location: [facility];  Service: Endoscopy;  Laterality: N/A;   LASIK     History reviewed. No pertinent family history.  Social History   Tobacco Use   Smoking status: Former    Packs/day: 0.25    Years: 40.00    Total pack years: 10.00    Types: Cigarettes    Quit date: 09/05/2022    Years since  quitting: 0.3   Smokeless tobacco: Never  Substance Use Topics   Alcohol use: No   Marital Status: Widowed  ROS  Review of Systems  Neurological:  Negative for weakness.   Objective  Blood pressure (!) 133/58, pulse 69, resp. rate 16, height 5' 1"$  (1.549 m), weight 91 lb (41.3 kg), SpO2 95 %. Body mass index is 17.19 kg/m.     01/02/2023    1:02 PM 11/02/2022    1:25 PM 09/26/2022    1:49 PM  Vitals with BMI  Height 5' 1"$  5' 1"$  5' 0"$   Weight 91 lbs 83 lbs 13 oz 86 lbs 6 oz  BMI 17.2 0000000 [id number]  Systolic Q000111Q [id number]  Diastolic 58 64 66  Pulse 69 56 55    General: Appears older than stated age, cachectic, hemodynamically stable, no acute distress HEENT: Moist mucous membranes, no JVP, trachea midline, no carotid bruits, [place] oxygen.  Lungs: Clear to auscultation bilaterally.  No wheezes rales or rhonchi's. Heart: regular, positive S1-S2, no murmurs rubs or gallops appreciated Abdomen: Cachectic, soft, nontender, nondistended, positive bowel sounds in all 4 quadrants Extremities: No pitting edema, 2+ DP and PT pulses, warm to touch Neuro: Alert to person, place, time, and situation.  Nonfocal. Psych: Normal mood and affect normal behavior.  Cooperative  Medications and allergies   Allergies  Allergen Reactions   Ciprofloxacin Nausea Only and Other (See Comments)    Nausea  and legs weak   Nitrofuran Derivatives Nausea Only and Other (See Comments)    nitrofuratonin bad nausea, legs weak   Paxil [Paroxetine] Other (See Comments)    Sick on stomach   Penicillins Other (See Comments)    Sick on stomach   Sulfa Antibiotics Other (See Comments)    jittery     Medication list after today's encounter   Current Outpatient Medications:    acetaminophen (TYLENOL 8 HOUR ARTHRITIS PAIN) 650 MG CR tablet, Take by mouth every 8 (eight) hours as needed., Disp: , Rfl:    acetaminophen (TYLENOL) 650 MG CR tablet, Take 1,300 mg by mouth daily as needed for pain. , Disp: , Rfl:     empagliflozin (JARDIANCE) 10 MG TABS tablet, Take 1 tablet (10 mg total) by mouth daily., Disp: 90 tablet, Rfl: 3   Fluticasone-Umeclidin-Vilant (TRELEGY ELLIPTA) 100-62.5-25 MCG/ACT AEPB, Inhale 1 puff into the lungs daily., Disp: 180 each, Rfl: 3   Hyoscyamine Sulfate SL 0.125 MG SUBL, , Disp: , Rfl:    ivabradine (CORLANOR) 5 MG TABS tablet, Take 1 tablet (5 mg total) by mouth 2 (two) times daily with a meal., Disp: 180 tablet, Rfl: 3   loratadine (CLARITIN) 10 MG tablet, Take 10 mg by mouth daily., Disp: , Rfl:    LORazepam (ATIVAN) 1 MG tablet, Take 1 tablet (1 mg total) by mouth daily at 8 pm., Disp: 10 tablet, Rfl: 0   losartan (COZAAR) 25 MG tablet, Take 1 tablet (25 mg total) by mouth every evening., Disp: 90 tablet, Rfl: 3   metoprolol succinate (TOPROL-XL) 50 MG 24 hr tablet, Take 1 tablet (50 mg total) by mouth in the morning., Disp: 90 tablet, Rfl: 3   mirtazapine (REMERON) 30 MG tablet, Take 30 mg by mouth at bedtime., Disp: , Rfl:    Multiple Vitamins-Minerals (MULTI VITAMIN/MINERALS) TABS, as directed Orally, Disp: , Rfl:    pantoprazole (PROTONIX) 20 MG tablet, Take 1 tablet (20 mg total) by mouth daily. (Patient taking differently: Take 40 mg by mouth daily.), Disp: 30 tablet, Rfl: 0   pantoprazole (PROTONIX) 40 MG tablet, Take 40 mg by mouth daily., Disp: , Rfl:    polyethylene glycol (MIRALAX / GLYCOLAX) 17 g packet, Take 17 g by mouth daily., Disp: 14 each, Rfl: 0   raloxifene (EVISTA) 60 MG tablet, Take 60 mg by mouth daily., Disp: , Rfl:    simvastatin (ZOCOR) 20 MG tablet, Take 20 mg by mouth at bedtime. , Disp: , Rfl:    sodium chloride (OCEAN) 0.65 % nasal spray, Place 2 sprays into the nose as needed., Disp: , Rfl:    spironolactone (ALDACTONE) 25 MG tablet, Take 0.5 tablets (12.5 mg total) by mouth daily., Disp: 45 tablet, Rfl: 3   venlafaxine XR (EFFEXOR-XR) 37.5 MG 24 hr capsule, Take 1 capsule (37.5 mg total) by mouth daily with breakfast. (Patient taking differently:  Take 75 mg by mouth daily with breakfast.), Disp: 30 capsule, Rfl: 0   levocetirizine (XYZAL) 5 MG tablet, Take 5 mg by mouth every evening. (Patient not taking: Reported on 01/02/2023), Disp: , Rfl:   Laboratory examination:   Lab Results  Component Value Date   NA 136 09/10/2022   K 3.5 09/10/2022   CO2 29 09/10/2022   GLUCOSE 73 09/10/2022   BUN 14 09/10/2022   CREATININE 0.60 09/10/2022   CALCIUM 9.2 09/10/2022   GFRNONAA >60 09/10/2022       Latest Ref Rng & Units 09/10/2022    2:22  AM 09/09/2022    5:45 AM 09/08/2022    6:19 AM  CMP  Glucose 70 - 99 mg/dL 73  85  102   BUN 8 - 23 mg/dL 14  8  <5   Creatinine 0.44 - 1.00 mg/dL 0.60  0.64  0.59   Sodium 135 - 145 mmol/L 136  137  134   Potassium 3.5 - 5.1 mmol/L 3.5  3.6  3.2   Chloride 98 - 111 mmol/L 93  95  94   CO2 22 - 32 mmol/L 29  32  34   Calcium 8.9 - 10.3 mg/dL 9.2  9.2  8.6   Total Protein 6.5 - 8.1 g/dL   5.6   Total Bilirubin 0.3 - 1.2 mg/dL   0.4   Alkaline Phos 38 - 126 U/L   42   AST 15 - 41 U/L   19   ALT 0 - 44 U/L   15       Latest Ref Rng & Units 09/08/2022    6:19 AM 09/07/2022    1:43 AM 09/06/2022    1:04 AM  CBC  WBC 4.0 - 10.5 K/uL 8.3  7.5  8.7   Hemoglobin 12.0 - 15.0 g/dL 12.5  11.3  11.1   Hematocrit 36.0 - 46.0 % 37.4  33.4  32.3   Platelets 150 - 400 K/uL 210  173  193     Lipid Panel Recent Labs    09/08/22 0619  CHOL 109  TRIG 49  LDLCALC 55  VLDL 10  HDL 44  CHOLHDL 2.5  LDLDIRECT 54    HEMOGLOBIN A1C No results found for: "HGBA1C", "MPG" TSH Recent Labs    03/16/22 2155 05/13/22 1859  TSH 1.194 1.292    External labs:     Radiology:    Cardiac Studies:     Echocardiogram: 09/06/2022: LVEF 20-25%, global hypokinesis, mild LVH, mild LAE, trivial MR, trivial pericardial effusion.  Echocardiogram 12/27/2022: Mildly depressed LV systolic function with visual EF 40-45%. Left ventricle cavity is normal in size. Normal left ventricular  wall thickness. Presence of a septal bulge. Doppler evidence of grade I (impaired) diastolic dysfunction, elevated LAP. Left ventricle regional wall motion findings: Basal anteroseptal, Basal inferoseptal, Mid anteroseptal, Mid inferoseptal and Apical septal hypokinesis. Mild tricuspid regurgitation. No evidence of pulmonary hypertension. Compared to 09/06/2022 LVEF improved from 20-25% to 40-45%, mild LAE and trivial pericardial effusion not appreciated on current study.    EKG:   09/26/2022: Sinus Rhythm with PACs, LBBB and LAFB  09/07/2022: Sinus rhythm, left bundle branch block, 76 bpm, without underlying injury pattern.     Assessment     ICD-10-CM   1. Essential hypertension  I10     2. HFrEF (heart failure with reduced ejection fraction) (HCC)  I50.20     3. Hyperlipidemia LDL goal <70  E78.5        No orders of the defined types were placed in this encounter.   No orders of the defined types were placed in this encounter.   Medications Discontinued During This Encounter  Medication Reason   mirtazapine (REMERON) 15 MG tablet Patient Preference   INCRUSE ELLIPTA 62.5 MCG/ACT AEPB Patient Preference   furosemide (LASIX) 20 MG tablet Patient Preference     Recommendations:   Carol A Brevik is a 87 y.o.  F with HFrEF, HTN, and HLD who is here for hospital follow-up   Essential hypertension Continue current cardiac medications. Encourage low-sodium diet, less than 2000  mg daily.   HFrEF (heart failure with reduced ejection fraction) (HCC) Continue current cardiac medications. EF has improved to about 45% now and patient is feeling much better. Still voices that she would like to continue medical therapy and avoid invasive procedures such as heart catheterization, stents, etc.  Patient was DNR during her inpatient stay.   Hyperlipidemia LDL goal <70 Continue statin     Floydene Flock, DO, [facility]  01/06/2023, 2:04 PM Office: [phone] Pager:  [phone]

## 2023-01-24 ENCOUNTER — Ambulatory Visit
Admission: RE | Admit: 2023-01-24 | Discharge: 2023-01-24 | Disposition: A | Discharge status: Home / Self Care | Payer: Medicare Other | Source: Ambulatory Visit | Attending: Family Medicine | Admitting: Family Medicine

## 2023-01-24 DIAGNOSIS — Z1231 Encounter for screening mammogram for malignant neoplasm of breast: Secondary | ICD-10-CM | POA: Diagnosis not present

## 2023-01-25 DIAGNOSIS — F331 Major depressive disorder, recurrent, moderate: Secondary | ICD-10-CM | POA: Diagnosis not present

## 2023-01-25 DIAGNOSIS — F411 Generalized anxiety disorder: Secondary | ICD-10-CM | POA: Diagnosis not present

## 2023-02-05 ENCOUNTER — Ambulatory Visit (HOSPITAL_BASED_OUTPATIENT_CLINIC_OR_DEPARTMENT_OTHER): Payer: Medicare Other | Admitting: Pulmonary Disease

## 2023-02-06 ENCOUNTER — Ambulatory Visit (INDEPENDENT_AMBULATORY_CARE_PROVIDER_SITE_OTHER): Payer: Medicare Other | Admitting: Pulmonary Disease

## 2023-02-06 ENCOUNTER — Encounter (HOSPITAL_BASED_OUTPATIENT_CLINIC_OR_DEPARTMENT_OTHER): Payer: Self-pay | Admitting: Pulmonary Disease

## 2023-02-06 VITALS — BP 144/68 | HR 61 | Temp 97.7°F | Ht 61.0 in | Wt 92.0 lb

## 2023-02-06 DIAGNOSIS — J9611 Chronic respiratory failure with hypoxia: Secondary | ICD-10-CM | POA: Diagnosis not present

## 2023-02-06 DIAGNOSIS — J432 Centrilobular emphysema: Secondary | ICD-10-CM

## 2023-02-06 NOTE — Assessment & Plan Note (Signed)
I have asked her to continue using oxygen during exertion and during sleep. She can stay off oxygen while at rest

## 2023-02-06 NOTE — Progress Notes (Signed)
   Subjective:    Patient ID: Carol [name], female    DOB: 1936/08/22, 87 y.o.   MRN: [id number]  HPI  87 yo smoker for FU of chronic hypoxic respiratory failure and emphysema  -on O2 since hosp discharge 02/2022 for COPD exacerbation and pneumonia   PMH - severe anxiety/depression, GERD, hyperlipidemia, hypertension    Chief Complaint  Patient presents with   Follow-up    Breathing is overall doing well. She has runny nose and also nasal congestion.     Initial OV 08/2022 >> switched from Advair and Incruse to Trelegy  admitted from 09/06/2022 to 09/12/2022 for altered mental status. she was found unresponsive at home by her daughter who administered CPR briefly and called EMS  -felt to be due to polypharmacy, she was on high-dose lorazepam 1 mg 4 times daily and since then has been cut down to once daily for anxiety echocardiogram which showed significant reduction in EF; 20-25%.  She was started on goal-directed therapy and on follow-up in February, echo showed EF improved to 40 to 45%. She uses oxygen at home but does not take it outside with her. Oxygen saturation 92% on room air on arrival.  She has gained 12 pounds from a low of 80 pounds She arrives accompanied by her granddaughter today She is compliant with Trelegy    Significant tests/ events reviewed   Ambulatory saturation 08/2022 started at 92% on room air, dropped to 78% on ambulation and required 3 L to maintain 93%   06/2022 CT chest without contrast >> extensive emphysema, prior nodular infiltrates and small right pleural effusion resolved , mild pericardial effusion slight increased   02/2022 CT chest significant for sup pleural opacities  08/2022 esophagram >> Moderate dysmotilit   Review of Systems neg for any significant sore throat, dysphagia, itching, sneezing, nasal congestion or excess/ purulent secretions, fever, chills, sweats, unintended wt loss, pleuritic or exertional cp, hempoptysis, orthopnea  pnd or change in chronic leg swelling. Also denies presyncope, palpitations, heartburn, abdominal pain, nausea, vomiting, diarrhea or change in bowel or urinary habits, dysuria,hematuria, rash, arthralgias, visual complaints, headache, numbness weakness or ataxia.     Objective:   Physical Exam  Gen. Pleasant,thin frail in no distress ENT - no thrush, no pallor/icterus,no post nasal drip Neck: No JVD, no thyromegaly, no carotid bruits Lungs: no use of accessory muscles, no dullness to percussion, clear without rales or rhonchi  Cardiovascular: Rhythm regular, heart sounds  normal, no murmurs or gallops, no peripheral edema Musculoskeletal: No deformities, no cyanosis or clubbing        Assessment & Plan:

## 2023-02-06 NOTE — Assessment & Plan Note (Signed)
Continue Trelegy. Use albuterol for rescue. We discussed action plan for COPD and signs and symptoms for COPD exacerbation

## 2023-02-06 NOTE — Patient Instructions (Signed)
X Amb sat  OK to use mucinex for sinus congestion  Continue on trelegy daily

## 2023-02-11 DIAGNOSIS — K219 Gastro-esophageal reflux disease without esophagitis: Secondary | ICD-10-CM | POA: Diagnosis not present

## 2023-02-11 DIAGNOSIS — R142 Eructation: Secondary | ICD-10-CM | POA: Diagnosis not present

## 2023-02-11 DIAGNOSIS — R1013 Epigastric pain: Secondary | ICD-10-CM | POA: Diagnosis not present

## 2023-02-22 DIAGNOSIS — F411 Generalized anxiety disorder: Secondary | ICD-10-CM | POA: Diagnosis not present

## 2023-02-22 DIAGNOSIS — F331 Major depressive disorder, recurrent, moderate: Secondary | ICD-10-CM | POA: Diagnosis not present

## 2023-02-28 DIAGNOSIS — I10 Essential (primary) hypertension: Secondary | ICD-10-CM | POA: Diagnosis not present

## 2023-02-28 DIAGNOSIS — K219 Gastro-esophageal reflux disease without esophagitis: Secondary | ICD-10-CM | POA: Diagnosis not present

## 2023-02-28 DIAGNOSIS — H9193 Unspecified hearing loss, bilateral: Secondary | ICD-10-CM | POA: Diagnosis not present

## 2023-02-28 DIAGNOSIS — F419 Anxiety disorder, unspecified: Secondary | ICD-10-CM | POA: Diagnosis not present

## 2023-02-28 DIAGNOSIS — I255 Ischemic cardiomyopathy: Secondary | ICD-10-CM | POA: Diagnosis not present

## 2023-02-28 DIAGNOSIS — F324 Major depressive disorder, single episode, in partial remission: Secondary | ICD-10-CM | POA: Diagnosis not present

## 2023-02-28 DIAGNOSIS — E7800 Pure hypercholesterolemia, unspecified: Secondary | ICD-10-CM | POA: Diagnosis not present

## 2023-02-28 DIAGNOSIS — J301 Allergic rhinitis due to pollen: Secondary | ICD-10-CM | POA: Diagnosis not present

## 2023-02-28 DIAGNOSIS — J449 Chronic obstructive pulmonary disease, unspecified: Secondary | ICD-10-CM | POA: Diagnosis not present

## 2023-03-01 DIAGNOSIS — H538 Other visual disturbances: Secondary | ICD-10-CM | POA: Diagnosis not present

## 2023-03-26 ENCOUNTER — Other Ambulatory Visit: Payer: Self-pay | Admitting: Gastroenterology

## 2023-04-15 DIAGNOSIS — H5315 Visual distortions of shape and size: Secondary | ICD-10-CM | POA: Diagnosis not present

## 2023-04-15 DIAGNOSIS — H538 Other visual disturbances: Secondary | ICD-10-CM | POA: Diagnosis not present

## 2023-04-15 DIAGNOSIS — H353134 Nonexudative age-related macular degeneration, bilateral, advanced atrophic with subfoveal involvement: Secondary | ICD-10-CM | POA: Diagnosis not present

## 2023-04-18 NOTE — Progress Notes (Signed)
[facility] - Clinic Note  04/25/2023   CHIEF COMPLAINT Patient presents for Retina Evaluation  HISTORY OF PRESENT ILLNESS: Carol [name] is a 87 y.o. female who presents to the clinic today for:  HPI     Retina Evaluation   In both eyes.  This started 1 week ago.  Duration of 1 week.  Associated Symptoms Floaters.  I, the attending physician,  performed the HPI with the patient and updated documentation appropriately.        Comments   Retina eval per Dr Karleen Hampshire ARMD OU pt is reporting no vision changes noticed she has noticed some floaters but denies flashes of light       Last edited by Rennis Chris, MD on 04/25/2023 10:38 AM.    Pt is here on the referral of Dr. 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 Hazle Quant   Referring physician: Aura Camps, MD [address] [place],  [place] [address]  HISTORICAL INFORMATION:  Selected notes from the MEDICAL RECORD NUMBER Referred by Dr. 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 (Ophthalmic Drugs)   No current facility-administered medications for this visit. (Ophthalmic Drugs)   Current Outpatient Medications (Other)  Medication Sig   acetaminophen (TYLENOL 8 HOUR ARTHRITIS PAIN) 650 MG CR tablet Take by mouth every 8 (eight) hours as  needed.   acetaminophen (TYLENOL) 650 MG CR tablet Take 1,300 mg by mouth daily as needed for pain.    empagliflozin (JARDIANCE) 10 MG TABS tablet Take 1 tablet (10 mg total) by mouth daily.   Fluticasone-Umeclidin-Vilant (TRELEGY ELLIPTA) 100-62.5-25 MCG/ACT AEPB Inhale 1 puff into the lungs daily.   Hyoscyamine Sulfate SL 0.125 MG SUBL    ivabradine (CORLANOR) 5 MG TABS tablet Take 1 tablet (5 mg total) by mouth 2 (two) times daily with a meal.   loratadine (CLARITIN) 10 MG tablet Take 10 mg by mouth daily.   LORazepam (ATIVAN) 1 MG tablet Take 1 tablet (1 mg total) by mouth daily at 8 pm.   losartan (COZAAR) 25 MG tablet Take 1 tablet (25 mg total) by mouth every evening.   metoprolol succinate (TOPROL-XL) 50 MG 24 hr tablet Take 1 tablet (50 mg total) by mouth in the morning.   mirtazapine (REMERON) 30 MG tablet Take 30 mg by mouth at bedtime.   Multiple Vitamins-Minerals (MULTI VITAMIN/MINERALS) TABS as directed Orally   pantoprazole (PROTONIX) 20 MG tablet Take 1 tablet (20 mg total) by mouth daily. (Patient taking differently: Take 40 mg by mouth daily.)   pantoprazole (PROTONIX) 40 MG tablet Take 40 mg by mouth daily.   polyethylene glycol (MIRALAX / GLYCOLAX) 17 g packet Take 17 g by mouth daily.   raloxifene (EVISTA) 60 MG tablet  Take 60 mg by mouth daily.   simvastatin (ZOCOR) 20 MG tablet Take 20 mg by mouth at bedtime.    sodium chloride (OCEAN) 0.65 % nasal spray Place 2 sprays into the nose as needed.   spironolactone (ALDACTONE) 25 MG tablet Take 0.5 tablets (12.5 mg total) by mouth daily.   Triamcinolone Acetonide (NASACORT AQ NA) Place 1 spray into the nose daily.   venlafaxine XR (EFFEXOR-XR) 150 MG 24 hr capsule Take 150 mg by mouth daily with breakfast.   No current facility-administered medications for this visit. (Other)   REVIEW OF SYSTEMS: ROS   Positive for: Gastrointestinal, Cardiovascular, Eyes, Allergic/Imm Last edited by Etheleen Mayhew, COT on  04/25/2023  9:40 AM.     ALLERGIES Allergies  Allergen Reactions   Ciprofloxacin Nausea Only and Other (See Comments)    Nausea and legs weak   Nitrofuran Derivatives Nausea Only and Other (See Comments)    nitrofuratonin bad nausea, legs weak   Paxil [Paroxetine] Other (See Comments)    Sick on stomach   Penicillins Other (See Comments)    Sick on stomach   Sulfa Antibiotics Other (See Comments)    jittery   PAST MEDICAL HISTORY Past Medical History:  Diagnosis Date   Anxiety    Arthritis    arthritis -hips   Colitis    Complication of anesthesia    B/P dropped post colonoscopy x1   GERD (gastroesophageal reflux disease)    Headache(784.0)    migraines rare occ.   Hypertension    Urinary tract infection early aug 2014   Past Surgical History:  Procedure Laterality Date   CATARACT EXTRACTION, BILATERAL Bilateral    COLONOSCOPY WITH PROPOFOL N/A 08/03/2013   Procedure: COLONOSCOPY WITH PROPOFOL;  Surgeon: Vertell Novak., MD;  Location: [facility];  Service: Endoscopy;  Laterality: N/A;  ultra thin colon scope   COLONOSCOPY WITH PROPOFOL N/A 02/04/2018   Procedure: COLONOSCOPY WITH PROPOFOL;  Surgeon: Carman Ching, MD;  Location: [facility];  Service: Endoscopy;  Laterality: N/A;   LASIK     FAMILY HISTORY History reviewed. No pertinent family history. SOCIAL HISTORY Social History   Tobacco Use   Smoking status: Former    Packs/day: 0.25    Years: 40.00    Additional pack years: 0.00    Total pack years: 10.00    Types: Cigarettes    Quit date: 09/05/2022    Years since quitting: 0.6   Smokeless tobacco: Never  Vaping Use   Vaping Use: Never used  Substance Use Topics   Alcohol use: No   Drug use: No       OPHTHALMIC EXAM:  Base Eye Exam     Visual Acuity (Snellen - Linear)       Right Left   Dist cc 20/30 20/70 -2   Dist ph cc 20/20 -2 20/50         Tonometry (Tonopen, 9:51 AM)       Right Left   Pressure 10 11         Pupils        Pupils Dark Light Shape React APD   Right PERRL 3 2 Round Sluggish None   Left PERRL 4 4 Round Minimal Trace         Visual Fields       Left Right    Full Full         Extraocular Movement       Right Left  Full, Ortho Full, Ortho         Neuro/Psych     Oriented x3: Yes   Mood/Affect: Normal         Dilation     Both eyes: 2.5% Phenylephrine @ 9:51 AM           Slit Lamp and Fundus Exam     Slit Lamp Exam       Right Left   Lids/Lashes Dermatochalasis - upper lid Dermatochalasis - upper lid   Conjunctiva/Sclera White and quiet White and quiet   Cornea mild arcus, tear film debris, trace PEE mild arcus, tear film debris, trace PEE   Anterior Chamber deep and clear deep and clear   Iris Round and dilated Round and dilated   Lens Toric PC IOL in good position with marks at 0300 and 0900 Toric PC IOL in good position with marks at 0300 and 0900   Anterior Vitreous Vitreous syneresis Vitreous syneresis, Posterior vitreous detachment         Fundus Exam       Right Left   Disc mild Pallor, Sharp rim, PPA 1-2+pallor, Sharp rim, PPA   C/D Ratio 0.3 0.4   Macula Flat, Blunted foveal reflex, RPE mottling, clumping and atrophy, no heme, CNV or edema Flat, Blunted foveal reflex, RPE mottling, clumping and atrophy, no heme, CNV or edema   Vessels attenuated, Tortuous attenuated, Tortuous   Periphery Attached, No heme Attached, No heme           Refraction     Wearing Rx       Sphere Cylinder Axis Add   Right -1.25 +0.75 174 +2.50   Left -0.75 +1.50 168          Manifest Refraction       Sphere Cylinder Axis Dist VA   Right       Left -1.00 +1.25 175 20/50-2           IMAGING AND PROCEDURES  Imaging and Procedures for 04/25/2023  OCT, Retina - OU - Both Eyes       Right Eye Quality was good. Central Foveal Thickness: 238. Progression has no prior data. Findings include no IRF, no SRF, abnormal foveal contour, myopic  contour, retinal drusen , outer retinal atrophy (Severely myopic contour with patchy ellipsoid signal / ORA).   Left Eye Quality was good. Central Foveal Thickness: 248. Progression has no prior data. Findings include normal foveal contour, no IRF, no SRF, retinal drusen (Severely myopic contour with patchy ellipsoid signal / ORA).   Notes *Images captured and stored on drive  Diagnosis / Impression:  Severe myopic contour OU patchy ellipsoid signal / ORA, no fluid OU Non ex ARMD OU -- mild drusen  Clinical management:  See below  Abbreviations: NFP - Normal foveal profile. CME - cystoid macular edema. PED - pigment epithelial detachment. IRF - intraretinal fluid. SRF - subretinal fluid. EZ - ellipsoid zone. ERM - epiretinal membrane. ORA - outer retinal atrophy. ORT - outer retinal tubulation. SRHM - subretinal hyper-reflective material. IRHM - intraretinal hyper-reflective material           ASSESSMENT/PLAN:   ICD-10-CM   1. Visual disturbances  H53.9     2. Uncomplicated degenerative myopia of both eyes  H44.23 OCT, Retina - OU - Both Eyes    3. Intermediate stage nonexudative age-related macular degeneration of both eyes  H35.3132 OCT, Retina - OU - Both Eyes    4. Essential hypertension  I10  5. Hypertensive retinopathy of both eyes  H35.033 OCT, Retina - OU - Both Eyes    6. Pseudophakia, both eyes  Z96.1     7. Dry eyes  H04.123      1. Visual disturbances  - pt reports several month history of episodes with her vision being flipped upside down upon waking  - initially occurred intermittently after naps on the couch but has since progressed to occur after waking in the bedroom  - episodes correct themselves after several minutes and with opening/closing and rubbing eyes.  - unclear etiology  - dilated exam with no significant pathologies other than dry cornea OU (OS > OD) and mild myopic  and nonexudative degeneration  - discussed findings, prognosis - no  retinal or ophthalmic interventions indicated or recommended at this time besides artifical tears / lubricant drops for corneal dryness - monitor  2. Myopic degeneration OU  - severely myopic contour w/ ellipsoid thinning/loss  - no myopic CNV or fluid  - monitor  - f/u 9-12 months, DFE, OCT  3. Age related macular degeneration, non-exudative, OU  - The incidence, anatomy, and pathology of dry AMD, risk of progression, and the AREDS and AREDS 2 studies including smoking risks discussed with patient.  - Recommend amsler grid monitoring  - f/u 9-12 mos -- DFE/OCT  4,5. Hypertensive retinopathy OU - discussed importance of tight BP control - monitor   6. Pseudophakia OU  - s/p CE/toric IOL OU  (Dr. Hazle Quant)  - IOLs in good position, doing well  - monitor  7. Dry eyes OU - recommend artificial tears and lubricating ointment as needed   Ophthalmic Meds Ordered this visit:  No orders of the defined types were placed in this encounter.    Return for f/u 9-12 months, myopic degeneration OU, DFE, OCT.  There are no Patient Instructions on file for this visit.  Explained the diagnoses, plan, and follow up with the patient and they expressed understanding.  Patient expressed understanding of the importance of proper follow up care.   This document serves as a record of services personally performed by Karie Chimera, MD, PhD. It was created on their behalf by Glee Arvin. Manson Passey, OA an ophthalmic technician. The creation of this record is the provider's dictation and/or activities during the visit.    Electronically signed by: Glee Arvin. Manson Passey, [place] 05.23.2024 10:53 AM   Karie Chimera, M.D., Ph.D. Diseases & Surgery of the Retina and Vitreous [facility] 04/25/2023  I have reviewed the above documentation for accuracy and completeness, and I agree with the above. Karie Chimera, M.D., Ph.D. 04/25/23 10:53 AM   Abbreviations: M myopia (nearsighted); A  astigmatism; H hyperopia (farsighted); P presbyopia; Mrx spectacle prescription;  CTL contact lenses; OD right eye; OS left eye; OU both eyes  XT exotropia; ET esotropia; PEK punctate epithelial keratitis; PEE punctate epithelial erosions; DES dry eye syndrome; MGD meibomian gland dysfunction; ATs artificial tears; PFAT's preservative free artificial tears; NSC nuclear sclerotic cataract; PSC posterior subcapsular cataract; ERM epi-retinal membrane; PVD posterior vitreous detachment; RD retinal detachment; DM diabetes mellitus; DR diabetic retinopathy; NPDR non-proliferative diabetic retinopathy; PDR proliferative diabetic retinopathy; CSME clinically significant macular edema; DME diabetic macular edema; dbh dot blot hemorrhages; CWS cotton wool spot; POAG primary open angle glaucoma; C/D cup-to-disc ratio; HVF humphrey visual field; GVF goldmann visual field; OCT optical coherence tomography; IOP intraocular pressure; BRVO Branch retinal vein occlusion; CRVO central retinal vein occlusion; CRAO central retinal  artery occlusion; BRAO branch retinal artery occlusion; RT retinal tear; SB scleral buckle; PPV pars plana vitrectomy; VH Vitreous hemorrhage; PRP panretinal laser photocoagulation; IVK intravitreal kenalog; VMT vitreomacular traction; MH Macular hole;  NVD neovascularization of the disc; NVE neovascularization elsewhere; AREDS age related eye disease study; ARMD age related macular degeneration; POAG primary open angle glaucoma; EBMD epithelial/anterior basement membrane dystrophy; ACIOL anterior chamber intraocular lens; IOL intraocular lens; PCIOL posterior chamber intraocular lens; Phaco/IOL phacoemulsification with intraocular lens placement; PRK photorefractive keratectomy; LASIK laser assisted in situ keratomileusis; HTN hypertension; DM diabetes mellitus; COPD chronic obstructive pulmonary disease

## 2023-04-19 DIAGNOSIS — F331 Major depressive disorder, recurrent, moderate: Secondary | ICD-10-CM | POA: Diagnosis not present

## 2023-04-19 DIAGNOSIS — F411 Generalized anxiety disorder: Secondary | ICD-10-CM | POA: Diagnosis not present

## 2023-04-25 ENCOUNTER — Encounter (INDEPENDENT_AMBULATORY_CARE_PROVIDER_SITE_OTHER): Payer: Self-pay | Admitting: Ophthalmology

## 2023-04-25 ENCOUNTER — Ambulatory Visit (INDEPENDENT_AMBULATORY_CARE_PROVIDER_SITE_OTHER): Payer: Medicare Other | Admitting: Ophthalmology

## 2023-04-25 DIAGNOSIS — H35033 Hypertensive retinopathy, bilateral: Secondary | ICD-10-CM | POA: Diagnosis not present

## 2023-04-25 DIAGNOSIS — H3581 Retinal edema: Secondary | ICD-10-CM

## 2023-04-25 DIAGNOSIS — H539 Unspecified visual disturbance: Secondary | ICD-10-CM | POA: Diagnosis not present

## 2023-04-25 DIAGNOSIS — H353132 Nonexudative age-related macular degeneration, bilateral, intermediate dry stage: Secondary | ICD-10-CM

## 2023-04-25 DIAGNOSIS — H04123 Dry eye syndrome of bilateral lacrimal glands: Secondary | ICD-10-CM | POA: Diagnosis not present

## 2023-04-25 DIAGNOSIS — I10 Essential (primary) hypertension: Secondary | ICD-10-CM

## 2023-04-25 DIAGNOSIS — H4423 Degenerative myopia, bilateral: Secondary | ICD-10-CM | POA: Diagnosis not present

## 2023-04-25 DIAGNOSIS — Z961 Presence of intraocular lens: Secondary | ICD-10-CM | POA: Diagnosis not present

## 2023-05-14 NOTE — Progress Notes (Signed)
  Granddaughter of pt called and LVMM on 05/13/23 and wanted me to review preprocedure instructions with her.  Called granddaughter on 05/14/23 at 1630pm and not available per pt.  Granddaughter will be available on 05/15/2023 between 1100 and 400pm.  Will call granddaughter on 05/15/23 and review instrucitons at that time.

## 2023-05-15 NOTE — Progress Notes (Signed)
Called granddaughter Toni Amend who handles meds and spoke with her in regards to procedure on 06/20/23.  REviewed with granddaughter what time pt is to arrive, npo after midnite.  Meds am of procedure with sip of water- inhaler as usual , metoprpolol, claritin, effexor, nasal spray, evista and corlanor.  .  Number given for endoscopy dept to call for  any questions.  Granddaughter voiced understanding.

## 2023-05-21 ENCOUNTER — Encounter (HOSPITAL_COMMUNITY)
Admission: RE | Disposition: A | Discharge status: Home / Self Care | Payer: Self-pay | Source: Ambulatory Visit | Attending: Gastroenterology

## 2023-05-21 ENCOUNTER — Ambulatory Visit (HOSPITAL_COMMUNITY): Payer: Medicare Other | Admitting: Certified Registered"

## 2023-05-21 ENCOUNTER — Ambulatory Visit (HOSPITAL_BASED_OUTPATIENT_CLINIC_OR_DEPARTMENT_OTHER): Payer: Medicare Other | Admitting: Certified Registered"

## 2023-05-21 ENCOUNTER — Other Ambulatory Visit: Payer: Self-pay

## 2023-05-21 ENCOUNTER — Ambulatory Visit (HOSPITAL_COMMUNITY)
Admission: RE | Admit: 2023-05-21 | Discharge: 2023-05-21 | Disposition: A | Discharge status: Home / Self Care | Payer: Medicare Other | Source: Ambulatory Visit | Attending: Gastroenterology | Admitting: Gastroenterology

## 2023-05-21 ENCOUNTER — Encounter (HOSPITAL_COMMUNITY): Payer: Self-pay | Admitting: Gastroenterology

## 2023-05-21 DIAGNOSIS — R1013 Epigastric pain: Secondary | ICD-10-CM | POA: Insufficient documentation

## 2023-05-21 DIAGNOSIS — Z87891 Personal history of nicotine dependence: Secondary | ICD-10-CM | POA: Diagnosis not present

## 2023-05-21 DIAGNOSIS — I509 Heart failure, unspecified: Secondary | ICD-10-CM | POA: Diagnosis not present

## 2023-05-21 DIAGNOSIS — K2970 Gastritis, unspecified, without bleeding: Secondary | ICD-10-CM | POA: Diagnosis not present

## 2023-05-21 DIAGNOSIS — Z79899 Other long term (current) drug therapy: Secondary | ICD-10-CM | POA: Diagnosis not present

## 2023-05-21 DIAGNOSIS — K219 Gastro-esophageal reflux disease without esophagitis: Secondary | ICD-10-CM | POA: Insufficient documentation

## 2023-05-21 DIAGNOSIS — K2900 Acute gastritis without bleeding: Secondary | ICD-10-CM | POA: Diagnosis not present

## 2023-05-21 DIAGNOSIS — I110 Hypertensive heart disease with heart failure: Secondary | ICD-10-CM | POA: Diagnosis not present

## 2023-05-21 DIAGNOSIS — K319 Disease of stomach and duodenum, unspecified: Secondary | ICD-10-CM | POA: Diagnosis not present

## 2023-05-21 HISTORY — PX: BIOPSY: SHX5522

## 2023-05-21 HISTORY — PX: ESOPHAGOGASTRODUODENOSCOPY (EGD) WITH PROPOFOL: SHX5813

## 2023-05-21 SURGERY — ESOPHAGOGASTRODUODENOSCOPY (EGD) WITH PROPOFOL
Anesthesia: Monitor Anesthesia Care

## 2023-05-21 MED ORDER — LACTATED RINGERS IV SOLN: INTRAVENOUS | Status: DC | PRN

## 2023-05-21 MED ORDER — PROPOFOL 10 MG/ML IV BOLUS
INTRAVENOUS | Status: DC | PRN
  Administered 2023-05-21 (×4): 10 mg via INTRAVENOUS

## 2023-05-21 MED ORDER — LIDOCAINE 2% (20 MG/ML) 5 ML SYRINGE
INTRAMUSCULAR | Status: DC | PRN
  Administered 2023-05-21: 40 mg via INTRAVENOUS

## 2023-05-21 MED ORDER — PROPOFOL 500 MG/50ML IV EMUL
INTRAVENOUS | Status: DC | PRN
  Administered 2023-05-21: 125 ug/kg/min via INTRAVENOUS

## 2023-05-21 MED ORDER — LACTATED RINGERS IV SOLN: INTRAVENOUS | Status: DC

## 2023-05-21 MED ORDER — PROPOFOL 500 MG/50ML IV EMUL
INTRAVENOUS | Status: AC
  Filled 2023-05-21: qty 100

## 2023-05-21 MED ORDER — SODIUM CHLORIDE 0.9 % IV SOLN: INTRAVENOUS | Status: DC

## 2023-05-21 SURGICAL SUPPLY — 15 items

## 2023-05-21 NOTE — Discharge Instructions (Signed)
YOU HAD AN ENDOSCOPIC PROCEDURE TODAY: Refer to the procedure report and other information in the discharge instructions given to you for any specific questions about what was found during the examination. If this information does not answer your questions, please call [facility] office at [phone] to clarify.  ? ?YOU SHOULD EXPECT: Some feelings of bloating in the abdomen. Passage of more gas than usual. Walking can help get rid of the air that was put into your GI tract during the procedure and reduce the bloating.  ? ?DIET: Your first meal following the procedure should be a light meal and then it is ok to progress to your normal diet. A half-sandwich or bowl of soup is an example of a good first meal. Heavy or fried foods are harder to digest and may make you feel nauseous or bloated. Drink plenty of fluids but you should avoid alcoholic beverages for 24 hours.  ? ?ACTIVITY: Your care partner should take you home directly after the procedure. You should plan to take it easy, moving slowly for the rest of the day. You can resume normal activity the day after the procedure however YOU SHOULD NOT DRIVE, use power tools, machinery or perform tasks that involve climbing or major physical exertion for 24 hours (because of the sedation medicines used during the test).  ? ?SYMPTOMS TO REPORT IMMEDIATELY: ?A gastroenterologist can be reached at any hour. Please call [phone]  for any of the following symptoms:  ? ?Following upper endoscopy (EGD, EUS, ERCP, esophageal dilation) ?Vomiting of blood or coffee ground material  ?New, significant abdominal pain  ?New, significant chest pain or pain under the shoulder blades  ?Painful or persistently difficult swallowing  ?New shortness of breath  ?Black, tarry-looking or red, bloody stools ? ?FOLLOW UP:  ?If any biopsies were taken you will be contacted by phone or by letter within the next 1-3 weeks. Call [phone]  if you have not heard about the biopsies in 3 weeks.   ?Please also call with any specific questions about appointments or follow up tests. YOU HAD AN ENDOSCOPIC PROCEDURE TODAY: Refer to the procedure report and other information in the discharge instructions given to you for any specific questions about what was found during the examination. If this information does not answer your questions, please call [facility] office at [phone] to clarify.   ?

## 2023-05-21 NOTE — Transfer of Care (Signed)
Immediate Anesthesia Transfer of Care Note  Patient: Carol [name]  Procedure(s) Performed: ESOPHAGOGASTRODUODENOSCOPY (EGD) WITH PROPOFOL BIOPSY  Patient Location: PACU and Endoscopy Unit  Anesthesia Type:MAC  Level of Consciousness: awake, alert , and patient cooperative  Airway & Oxygen Therapy: Patient Spontanous Breathing and Patient connected to face mask oxygen  Post-op Assessment: Report given to RN and Post -op Vital signs reviewed and stable  Post vital signs: Reviewed and stable  Last Vitals:  Vitals Value Taken Time  BP 127/87 05/21/23 0752    05/21/23 0752  Pulse 55 05/21/23 0752  16 16 05/21/23 0752  SpO2 100% 05/21/23 0752    Last Pain:  Vitals:   05/21/23 0752  TempSrc: Temporal  PainSc: 0-No pain         Complications: No notable events documented.

## 2023-05-21 NOTE — Anesthesia Preprocedure Evaluation (Signed)
Anesthesia Evaluation  Patient identified by MRN, date of birth, ID band Patient awake    Reviewed: Allergy & Precautions, H&P , NPO status , Patient's Chart, lab work & pertinent test results  Airway Mallampati: II  TM Distance: >3 FB Neck ROM: Full    Dental no notable dental hx.    Pulmonary COPD, former smoker   Pulmonary exam normal breath sounds clear to auscultation       Cardiovascular hypertension, +CHF  Normal cardiovascular exam Rhythm:Regular Rate:Normal  1. Left ventricular ejection fraction, by estimation, is 20 to 25%. The  left ventricle has severely decreased function. The left ventricle  demonstrates global hypokinesis. There is mild left ventricular  hypertrophy. Left ventricular diastolic parameters   are indeterminate.   2. Right ventricular systolic function is normal. The right ventricular  size is normal.   3. Left atrial size was mildly dilated.   4. There is no evidence of cardiac tamponade.   5. The mitral valve is normal in structure. Trivial mitral valve  regurgitation. No evidence of mitral stenosis.   6. The aortic valve is normal in structure. Aortic valve regurgitation is  not visualized. No aortic stenosis is present.     Neuro/Psych negative neurological ROS  negative psych ROS   GI/Hepatic Neg liver ROS,GERD  ,,  Endo/Other  negative endocrine ROS    Renal/GU negative Renal ROS  negative genitourinary   Musculoskeletal negative musculoskeletal ROS (+)    Abdominal   Peds negative pediatric ROS (+)  Hematology negative hematology ROS (+)   Anesthesia Other Findings   Reproductive/Obstetrics negative OB ROS                             Anesthesia Physical Anesthesia Plan  ASA: 4  Anesthesia Plan: MAC   Post-op Pain Management: Minimal or no pain anticipated   Induction: Intravenous  PONV Risk Score and Plan: 2 and Propofol infusion and  Treatment may vary due to age or medical condition  Airway Management Planned: Simple Face Mask  Additional Equipment:   Intra-op Plan:   Post-operative Plan:   Informed Consent: I have reviewed the patients History and Physical, chart, labs and discussed the procedure including the risks, benefits and alternatives for the proposed anesthesia with the patient or authorized representative who has indicated his/her understanding and acceptance.     Dental advisory given  Plan Discussed with: CRNA and Surgeon  Anesthesia Plan Comments:        Anesthesia Quick Evaluation

## 2023-05-21 NOTE — Interval H&P Note (Signed)
History and Physical Interval Note:  05/21/2023 9:17 AM  Carol [name]  has presented today for surgery, with the diagnosis of Epigastric pain/Belching/GERD.  The various methods of treatment have been discussed with the patient and family. After consideration of risks, benefits and other options for treatment, the patient has consented to  Procedure(s): ESOPHAGOGASTRODUODENOSCOPY (EGD) WITH PROPOFOL (N/A) as a surgical intervention.  The patient's history has been reviewed, patient examined, no change in status, stable for surgery.  I have reviewed the patient's chart and labs.  Questions were answered to the patient's satisfaction.     Shirley Friar

## 2023-05-21 NOTE — H&P (Signed)
Date of Initial H&P: 05/15/23  History reviewed, patient examined, no change in status, stable for surgery.

## 2023-05-21 NOTE — Anesthesia Postprocedure Evaluation (Signed)
Anesthesia Post Note  Patient: Carol [name]  Procedure(s) Performed: ESOPHAGOGASTRODUODENOSCOPY (EGD) WITH PROPOFOL BIOPSY     Patient location during evaluation: PACU Anesthesia Type: MAC Level of consciousness: awake and alert Pain management: pain level controlled Vital Signs Assessment: post-procedure vital signs reviewed and stable Respiratory status: spontaneous breathing, nonlabored ventilation, respiratory function stable and patient connected to nasal cannula oxygen Cardiovascular status: stable and blood pressure returned to baseline Postop Assessment: no apparent nausea or vomiting Anesthetic complications: no  No notable events documented.  Last Vitals:  Vitals:   05/21/23 0950 05/21/23 0954  BP: (!) 151/62   Pulse: (!) 52 (!) 54  Resp: (!) 21 19  Temp:    SpO2: 92% 96%    Last Pain:  Vitals:   05/21/23 0954  TempSrc:   PainSc: 0-No pain                 Rosalie Gelpi S

## 2023-05-21 NOTE — Op Note (Signed)
[facility] Patient Name: Carol [name] Procedure Date: 05/21/2023 MRN: [id number] Attending MD: Shirley Friar , MD, [id number] Date of Birth: November 03, 1936 CSN: [id number] Age: 87 Admit Type: Outpatient Procedure:                Upper GI endoscopy Indications:              Epigastric abdominal pain Providers:                Shirley Friar, MD, Adin Hector, RN, Marja Kays, Technician, Janae Sauce. Steele Berg, RN Referring MD:             Johny Blamer Medicines:                Propofol per Anesthesia, Monitored Anesthesia Care Complications:            No immediate complications. Estimated Blood Loss:     Estimated blood loss was minimal. Procedure:                Pre-Anesthesia Assessment:                           - Prior to the procedure, a History and Physical                            was performed, and patient medications and                            allergies were reviewed. The patient's tolerance of                            previous anesthesia was also reviewed. The risks                            and benefits of the procedure and the sedation                            options and risks were discussed with the patient.                            All questions were answered, and informed consent                            was obtained. Prior Anticoagulants: The patient has                            taken no anticoagulant or antiplatelet agents. ASA                            Grade Assessment: IV - A patient with severe                            systemic disease that is a constant threat to life.  After reviewing the risks and benefits, the patient                            was deemed in satisfactory condition to undergo the                            procedure.                           After obtaining informed consent, the endoscope was                            passed under direct vision.  Throughout the                            procedure, the patient's blood pressure, pulse, and                            oxygen saturations were monitored continuously. The                            [id number]-[id number] ([id number] Olympus endoscope was introduced                            through the mouth, and advanced to the second part                            of duodenum. The upper GI endoscopy was                            accomplished without difficulty. The patient                            tolerated the procedure well. Scope In: Scope Out: Findings:      The examined esophagus was normal.      The Z-line was regular and was found 38 cm from the incisors.      Segmental moderate inflammation characterized by congestion (edema),       erosions and erythema was found in the gastric antrum. Biopsies were       taken with a cold forceps for histology. Estimated blood loss was       minimal.      The cardia and gastric fundus were normal on retroflexion.      The examined duodenum was normal. Impression:               - Normal esophagus.                           - Z-line regular, 38 cm from the incisors.                           - Acute gastritis. Biopsied.                           - Normal examined duodenum. Moderate Sedation:      N/A -  MAC procedure Recommendation:           - Patient has a contact number available for                            emergencies. The signs and symptoms of potential                            delayed complications were discussed with the                            patient. Return to normal activities tomorrow.                            Written discharge instructions were provided to the                            patient.                           - Follow an antireflux regimen.                           - Await pathology results. Procedure Code(s):        --- Professional ---                           [phone], Esophagogastroduodenoscopy, flexible,                             transoral; with biopsy, single or multiple Diagnosis Code(s):        --- Professional ---                           R10.13, Epigastric pain                           K29.00, Acute gastritis without bleeding CPT copyright 2022 [employer]. All rights reserved. The codes documented in this report are preliminary and upon coder review may  be revised to meet current compliance requirements. Shirley Friar, MD 05/21/2023 9:34:33 AM This report has been signed electronically. Number of Addenda: 0

## 2023-05-22 LAB — SURGICAL PATHOLOGY

## 2023-05-24 ENCOUNTER — Encounter (HOSPITAL_COMMUNITY): Payer: Self-pay | Admitting: Gastroenterology

## 2023-06-10 ENCOUNTER — Ambulatory Visit (INDEPENDENT_AMBULATORY_CARE_PROVIDER_SITE_OTHER): Payer: Medicare Other | Admitting: Nurse Practitioner

## 2023-06-10 ENCOUNTER — Encounter: Payer: Self-pay | Admitting: Nurse Practitioner

## 2023-06-10 VITALS — BP 116/70 | HR 60 | Temp 98.3°F | Ht 60.0 in | Wt 101.0 lb

## 2023-06-10 DIAGNOSIS — J432 Centrilobular emphysema: Secondary | ICD-10-CM | POA: Diagnosis not present

## 2023-06-10 DIAGNOSIS — J9611 Chronic respiratory failure with hypoxia: Secondary | ICD-10-CM | POA: Diagnosis not present

## 2023-06-10 DIAGNOSIS — J300 Vasomotor rhinitis: Secondary | ICD-10-CM

## 2023-06-10 MED ORDER — IPRATROPIUM BROMIDE 0.03 % NA SOLN: 2.0000 | Freq: Three times a day (TID) | NASAL | 5 refills | Status: AC

## 2023-06-10 NOTE — Progress Notes (Signed)
@Patient  ID: [place] A Banko, female    DOB: 03-13-1936, 87 y.o.   MRN: [id number]  Chief Complaint  Patient presents with   Follow-up    States no issues at this time    Referring provider: Noberto Retort, MD  HPI: 87 year old female, former smoker followed for COPD and chronic respiratory failure. She is a patient of Dr. Reginia Naas and last seen in office 02/06/2023. Past medical history significant for HTN, cardiomyopathy, HFrEF, GERD, anxiety, HLD, severe protein-calorie malnutrition, depression.  TEST/EVENTS:  07/18/2022 CT chest wo con: small pericardial effusion, slightly increased. Extensive CAD/atherosclerosis. No LAD. Small right pleural effusion previously seen has resolved. Extensive emphysematous changes. Some biapical scarring. Possible subpleural pulmonary nodules resolved.  12/27/2022 echo: EF 40-45%. GIDD. Mild TR. No evidence of PH.   09/03/2022: OV with Dr. Vassie Loll.  Admitted 02/2022 for COPD exacerbation and pneumonia.  Subpleural nodular densities were noted during this time.  She was treated with antibiotics and steroids.  She was discharged home on oxygen.  She has been compliant with her oxygen 24/7.  Prior to this she was only using it as needed.  She has lost significant amount of weight from 109 pounds 2 years ago to her current weight of 82 pounds.  Being treated for depression and severe anxiety with BuSpar, Effexor and lorazepam.  She had a lot of swallowing problems.  She has been trying nutritional supplements but weight has not increased.  Ordered esophagram for dysphagia.  No evidence of malignancy on recent CT scan given infiltrates have resolved.  COPD is treated with Advair and Incruse -switch to Trelegy.  She was also provided with nebulizer and albuterol nebs to use for rescue.  Smoking cessation emphasized.  Required 3 L of supplemental oxygen with walking oximetry.  Evaluate for POC.  11/02/2022: Ov with Lashya Passe NP for follow-up with her granddaughter, who helps  take care of her manage her medicines.  After her last visit, she was found unresponsive at home by her daughter who administered CPR briefly and called EMS. Unclear if she truly lost pulses. When EMS arrived, patient had a pulse and was transported to the ED, where she remained unresponsive. She was admitted from 09/06/2022 to 09/12/2022 for altered mental status. Urinalysis was positive for benzodiazepines, which she was on scheduled ativan. Etiology was unclear as initial workup was unremarkable for sepsis/infectious process, significant electrolyte imbalances, or profound hypoxia. She underwent echocardiogram which showed significant reduction in EF; 20-25%. Shock possibly related to medication and hypoperfusion in the setting of hypotension/cardiogenic shock. She was started on losartan, lasix, farxiga, metoprolol, and aldactone. She had gradual improvement and was discharged home on 10/18.  Since then, she has slowly improved. She's been trying to put weight back up, which she has gained about 6 lb back. Doesn't seem to be fluid as she doesn't have any leg swelling and no trouble with her breathing when compared to baseline. She has been eating magic cups at home but these were expensive so she hasn't gotten more since she ran out. Did recently have a bout of bronchitis. Was treated by her PCP with azithromycin. Feels improved today. Breathing is at her baseline. Cough has resolved. She's still having some chest congestion as well as postnasal drainage. Feels like a lot of her congestion comes from her sinuses. She does take claritin, which helps. Not doing any nasal sprays aside from saline. She denies fevers, chills, night sweats, decreased appetite, hemoptysis, orthopnea, palpitations, PND. She is eating  well. She monitors her oxygen levels at home. Wears her oxygen with most activity and at night. Hasn't noticed any levels <88-90%. Using trelegy daily. She did quit smoking after her hospital stay.    02/05/2022: OV with Dr. Vassie Loll. F/u with cardiology in February with improvement in EF to 40-45%. Uses her oxygen at home but doesn't take it out of the house with her. She's gained 12 pounds from a low of 80 lb.   06/10/2023: Today - follow up Patient presents today for follow up with her granddaughter. She has been doing well since she was here last. She has not had any exacerbations or hospitalizations. She's put 10 more pounds on. She does not have much trouble with her breathing. Feels like she can get up and do whatever she needs. She denies any cough, chest congestion, wheezing, leg swelling, orthopnea. She does have some trouble with nasal congestion and her nose tends to drip, especially when eating. Uses her Trelegy, nasacort, singulair, claritin and occasional chlortab. Hasn't needed her rescue.  She has not been using her oxygen. Oxygen levels have been good at home.   Allergies  Allergen Reactions   Ciprofloxacin Nausea Only and Other (See Comments)    Nausea and legs weak   Nitrofuran Derivatives Nausea Only and Other (See Comments)    nitrofuratonin bad nausea, legs weak   Paxil [Paroxetine] Other (See Comments)    Sick on stomach   Penicillins Other (See Comments)    Sick on stomach   Sulfa Antibiotics Other (See Comments)    jittery     There is no immunization history on file for this patient.  Past Medical History:  Diagnosis Date   Anxiety    Arthritis    arthritis -hips   Colitis    Complication of anesthesia    B/P dropped post colonoscopy x1   GERD (gastroesophageal reflux disease)    Headache(784.0)    migraines rare occ.   Hypertension    Urinary tract infection early aug 2014    Tobacco History: Social History   Tobacco Use  Smoking Status Former   Current packs/day: 0.00   Average packs/day: 0.3 packs/day for 40.0 years (10.0 ttl pk-yrs)   Types: Cigarettes   Start date: 09/05/1982   Quit date: 09/05/2022   Years since quitting: 0.7   Smokeless Tobacco Never   Counseling given: Not Answered   Outpatient Medications Prior to Visit  Medication Sig Dispense Refill   acetaminophen (TYLENOL 8 HOUR ARTHRITIS PAIN) 650 MG CR tablet Take 650-1,300 mg by mouth every 8 (eight) hours as needed for pain.     acetaminophen (TYLENOL) 650 MG CR tablet Take 1,300 mg by mouth daily as needed for pain.      aspirin 81 MG chewable tablet Chew 81 mg by mouth daily with lunch.     chlorpheniramine (CHLOR-TRIMETON) 4 MG tablet Take 4 mg by mouth daily as needed for allergies.     empagliflozin (JARDIANCE) 10 MG TABS tablet Take 1 tablet (10 mg total) by mouth daily. 90 tablet 3   famotidine (PEPCID) 10 MG tablet Take 20 mg by mouth daily as needed for heartburn or indigestion.     Fluticasone-Umeclidin-Vilant (TRELEGY ELLIPTA) 100-62.5-25 MCG/ACT AEPB Inhale 1 puff into the lungs daily. 180 each 3   ivabradine (CORLANOR) 5 MG TABS tablet Take 1 tablet (5 mg total) by mouth 2 (two) times daily with a meal. 180 tablet 3   loratadine (CLARITIN) 10 MG tablet Take 10 mg  by mouth daily.     LORazepam (ATIVAN) 1 MG tablet Take 1 tablet (1 mg total) by mouth daily at 8 pm. 10 tablet 0   losartan (COZAAR) 25 MG tablet Take 1 tablet (25 mg total) by mouth every evening. 90 tablet 3   metoprolol succinate (TOPROL-XL) 50 MG 24 hr tablet Take 1 tablet (50 mg total) by mouth in the morning. 90 tablet 3   mirtazapine (REMERON) 30 MG tablet Take 30 mg by mouth at bedtime.     montelukast (SINGULAIR) 10 MG tablet Take 10 mg by mouth daily.     Multiple Vitamins-Minerals (MULTI VITAMIN/MINERALS) TABS Take 1 tablet by mouth daily.     pantoprazole (PROTONIX) 40 MG tablet Take 40 mg by mouth 2 (two) times daily.     Polyethyl Glycol-Propyl Glycol (SYSTANE OP) Place 1 drop into both eyes 3 (three) times daily.     polyethylene glycol (MIRALAX / GLYCOLAX) 17 g packet Take 17 g by mouth daily. (Patient taking differently: Take 17 g by mouth daily as needed for  moderate constipation.) 14 each 0   raloxifene (EVISTA) 60 MG tablet Take 60 mg by mouth daily.     simvastatin (ZOCOR) 20 MG tablet Take 20 mg by mouth at bedtime.      spironolactone (ALDACTONE) 25 MG tablet Take 0.5 tablets (12.5 mg total) by mouth daily. 45 tablet 3   triamcinolone (NASACORT ALLERGY 24HR) 55 MCG/ACT AERO nasal inhaler Place 2 sprays into the nose daily.     venlafaxine XR (EFFEXOR-XR) 150 MG 24 hr capsule Take 150 mg by mouth daily with breakfast.     No facility-administered medications prior to visit.     Review of Systems:   Constitutional: No weight loss or gain, night sweats, fevers, chills, lassitude +fatigue (baseline) HEENT: No headaches, difficulty swallowing, tooth/dental problems, or sore throat. No sneezing, itching, ear ache +nasal congestion, post nasal drip CV:  No chest pain, orthopnea, PND, swelling in lower extremities, anasarca, dizziness, palpitations, syncope Resp: +shortness of breath with exertion (baseline). No cough. No excess mucus or change in color of mucus. No hemoptysis. No wheezing.  No chest wall deformity GI:  No heartburn, indigestion, abdominal pain, nausea, vomiting, diarrhea, change in bowel habits, loss of appetite  MSK:  No joint pain or swelling.   Neuro: No dizziness or lightheadedness.  Psych: No depression or anxiety. Mood stable.     Physical Exam:  BP 116/70 (BP Location: Left Arm, Patient Position: Sitting, Cuff Size: Normal)   Pulse 60   Temp 98.3 F (36.8 C) (Oral)   Ht 5' (1.524 m)   Wt 101 lb (45.8 kg)   SpO2 97% Comment: head probe-RA  BMI 19.73 kg/m   GEN: Pleasant, interactive, well-kempt; frail, elderly; in no acute distress. HEENT:  Normocephalic and atraumatic. PERRLA. Sclera white. Nasal turbinates pink, moist and patent bilaterally. No rhinorrhea present. Oropharynx pink and moist, without exudate or edema. No lesions, ulcerations NECK:  Supple w/ fair ROM. No JVD present. Normal carotid impulses w/o  bruits. Thyroid symmetrical with no goiter or nodules palpated. No lymphadenopathy.   CV: RRR, no m/r/g, no peripheral edema. Pulses intact, +2 bilaterally. No cyanosis, pallor or clubbing. PULMONARY:  Unlabored, regular breathing. Diminished bilaterally A&P w/o wheezes/rales/rhonchi. No accessory muscle use.  GI: BS present and normoactive. Soft, non-tender to palpation. No organomegaly or masses detected. MSK: No erythema, warmth or tenderness. Cap refil <2 sec all extrem. No deformities or joint swelling noted. Muscle wasting Neuro:  A/Ox3. No focal deficits noted.   Skin: Warm, no lesions or rashe Psych: Normal affect and behavior. Judgement and thought content appropriate.     Lab Results:  CBC    Component Value Date/Time   WBC 8.3 09/08/2022 0619   RBC 3.71 (L) 09/08/2022 0619   HGB 12.5 09/08/2022 0619   HCT 37.4 09/08/2022 0619   HCT 42.2 04/24/2019 2052   PLT 210 09/08/2022 0619   MCV 100.8 (H) 09/08/2022 0619   MCH 33.7 09/08/2022 0619   MCHC 33.4 09/08/2022 0619   RDW 12.9 09/08/2022 0619   LYMPHSABS 1.4 09/08/2022 0619   MONOABS 0.7 09/08/2022 0619   EOSABS 0.1 09/08/2022 0619   BASOSABS 0.0 09/08/2022 0619    BMET    Component Value Date/Time   NA 136 09/10/2022 0222   K 3.5 09/10/2022 0222   CL 93 (L) 09/10/2022 0222   CO2 29 09/10/2022 0222   GLUCOSE 73 09/10/2022 0222   BUN 14 09/10/2022 0222   CREATININE 0.60 09/10/2022 0222   CALCIUM 9.2 09/10/2022 0222   GFRNONAA >60 09/10/2022 0222   GFRAA >60 04/27/2019 0501    BNP    Component Value Date/Time   BNP 546.1 (H) 09/10/2022 0222     Imaging:  No results found.  Administration History     None           No data to display          No results found for: "NITRICOXIDE"      Assessment & Plan:   Centrilobular emphysema (HCC) Compensated on current regimen. No recent exacerbations or hospitalizations. Encouraged to remain active. Action plan in place.  Patient Instructions   Continue Trelegy 1 puff daily. Brush tongue and rinse mouth afterwards Continue Albuterol inhaler 2 puffs or 3 mL neb every 6 hours as needed for shortness of breath or wheezing. Notify if symptoms persist despite rescue inhaler/neb use.  Continue protonix 1 tab daily Continue claritin 1 tab daily Continue singulair At bedtime Continue Nasacort 2 sprays each nostril daily  Continue chlorpheniramine 4 mg tablet over the counter At bedtime as needed for sinus drainage  Overnight oxygen study to see if you need to restart your oxygen at night. Do not wear if the night of your study  Ipratropium (Atrovent) nasal spray 2 sprays each nostril Three times a day for nasal congestion/drainage. Use before meals    Follow up in 4 months with Dr. Vassie Loll. If symptoms worsen, please contact office for sooner follow up or seek emergency care   Chronic respiratory failure with hypoxia (HCC) Not using supplemental O2. No desaturations on room air during walk test. Will obtain ONO on room air to evaluate for nocturnal hypoxemia. Goal >88-90%  Vasomotor rhinitis Add on ipratropium nasal spray. Continue nasacort, singulair, and claritin.     I spent 35 minutes of dedicated to the care of this patient on the date of this encounter to include pre-visit review of records, face-to-face time with the patient discussing conditions above, post visit ordering of testing, clinical documentation with the electronic health record, making appropriate referrals as documented, and communicating necessary findings to members of the patients care team.  Noemi Chapel, NP 06/10/2023  Pt aware and understands NP's role.

## 2023-06-10 NOTE — Assessment & Plan Note (Signed)
Not using supplemental O2. No desaturations on room air during walk test. Will obtain ONO on room air to evaluate for nocturnal hypoxemia. Goal >88-90%

## 2023-06-10 NOTE — Patient Instructions (Addendum)
Continue Trelegy 1 puff daily. Brush tongue and rinse mouth afterwards Continue Albuterol inhaler 2 puffs or 3 mL neb every 6 hours as needed for shortness of breath or wheezing. Notify if symptoms persist despite rescue inhaler/neb use.  Continue protonix 1 tab daily Continue claritin 1 tab daily Continue singulair At bedtime Continue Nasacort 2 sprays each nostril daily  Continue chlorpheniramine 4 mg tablet over the counter At bedtime as needed for sinus drainage  Overnight oxygen study to see if you need to restart your oxygen at night. Do not wear if the night of your study  Ipratropium (Atrovent) nasal spray 2 sprays each nostril Three times a day for nasal congestion/drainage. Use before meals    Follow up in 4 months with Dr. Vassie Loll. If symptoms worsen, please contact office for sooner follow up or seek emergency care

## 2023-06-10 NOTE — Assessment & Plan Note (Signed)
Add on ipratropium nasal spray. Continue nasacort, singulair, and claritin.

## 2023-06-10 NOTE — Assessment & Plan Note (Signed)
Compensated on current regimen. No recent exacerbations or hospitalizations. Encouraged to remain active. Action plan in place.  Patient Instructions  Continue Trelegy 1 puff daily. Brush tongue and rinse mouth afterwards Continue Albuterol inhaler 2 puffs or 3 mL neb every 6 hours as needed for shortness of breath or wheezing. Notify if symptoms persist despite rescue inhaler/neb use.  Continue protonix 1 tab daily Continue claritin 1 tab daily Continue singulair At bedtime Continue Nasacort 2 sprays each nostril daily  Continue chlorpheniramine 4 mg tablet over the counter At bedtime as needed for sinus drainage  Overnight oxygen study to see if you need to restart your oxygen at night. Do not wear if the night of your study  Ipratropium (Atrovent) nasal spray 2 sprays each nostril Three times a day for nasal congestion/drainage. Use before meals    Follow up in 4 months with Carol [name]. If symptoms worsen, please contact office for sooner follow up or seek emergency care

## 2023-07-03 ENCOUNTER — Ambulatory Visit: Payer: Medicare Other | Admitting: Cardiology

## 2023-07-03 ENCOUNTER — Encounter: Payer: Self-pay | Admitting: Cardiology

## 2023-07-03 VITALS — BP 135/52 | HR 59 | Resp 16 | Ht 60.0 in | Wt 101.0 lb

## 2023-07-03 DIAGNOSIS — I10 Essential (primary) hypertension: Secondary | ICD-10-CM

## 2023-07-03 DIAGNOSIS — I5020 Unspecified systolic (congestive) heart failure: Secondary | ICD-10-CM

## 2023-07-03 MED ORDER — LOSARTAN POTASSIUM 25 MG PO TABS: 25.0000 mg | ORAL_TABLET | Freq: Every evening | ORAL | 3 refills | Status: DC

## 2023-07-03 MED ORDER — ASPIRIN 81 MG PO CHEW: 81.0000 mg | CHEWABLE_TABLET | Freq: Every day | ORAL | 3 refills | Status: AC

## 2023-07-03 MED ORDER — IVABRADINE HCL 5 MG PO TABS: 5.0000 mg | ORAL_TABLET | Freq: Two times a day (BID) | ORAL | 3 refills | Status: DC

## 2023-07-03 MED ORDER — EMPAGLIFLOZIN 10 MG PO TABS: 10.0000 mg | ORAL_TABLET | Freq: Every day | ORAL | 3 refills | Status: DC

## 2023-07-03 MED ORDER — METOPROLOL SUCCINATE ER 50 MG PO TB24: 50.0000 mg | ORAL_TABLET | Freq: Every morning | ORAL | 3 refills | Status: DC

## 2023-07-03 MED ORDER — SIMVASTATIN 20 MG PO TABS: 20.0000 mg | ORAL_TABLET | Freq: Every day | ORAL | 3 refills | Status: DC

## 2023-07-03 MED ORDER — SPIRONOLACTONE 25 MG PO TABS: 12.5000 mg | ORAL_TABLET | Freq: Every day | ORAL | 3 refills | Status: DC

## 2023-07-03 NOTE — Progress Notes (Signed)
Follow up visit  Subjective:   Carol [name], female    DOB: [birth year], 87 y.o.   MRN: [id number]    HPI  Chief Complaint  Patient presents with   Hypertension   Follow-up    6 month    87 y.o. Caucasian female, with hypertension, hyperlipidemia, extensive coronary atherosclerosis, HFrEF, former smoker with COPD, extensive emphysematous changes, severe protein calorie malnutrition, depression.    Patient was previously noted to have EF of 25% that had subsequently improved to 45%.  Patient opted conservative medical therapy, and opted against any invasive workup.  Patient is here today with her granddaughter.  She is doing well, without any complaint of severe chest pain or shortness of breath.  She is compliant with her medical therapy.  She gets her medications through [facility] and cost is not an issue and that she has to get them from local pharmacy for short-term.      Current Outpatient Medications:    acetaminophen (TYLENOL 8 HOUR ARTHRITIS PAIN) 650 MG CR tablet, Take 650-1,300 mg by mouth every 8 (eight) hours as needed for pain., Disp: , Rfl:    acetaminophen (TYLENOL) 650 MG CR tablet, Take 1,300 mg by mouth daily as needed for pain. , Disp: , Rfl:    aspirin 81 MG chewable tablet, Chew 81 mg by mouth daily with lunch., Disp: , Rfl:    chlorpheniramine (CHLOR-TRIMETON) 4 MG tablet, Take 4 mg by mouth daily as needed for allergies., Disp: , Rfl:    empagliflozin (JARDIANCE) 10 MG TABS tablet, Take 1 tablet (10 mg total) by mouth daily., Disp: 90 tablet, Rfl: 3   famotidine (PEPCID) 10 MG tablet, Take 20 mg by mouth daily as needed for heartburn or indigestion., Disp: , Rfl:    Fluticasone-Umeclidin-Vilant (TRELEGY ELLIPTA) 100-62.5-25 MCG/ACT AEPB, Inhale 1 puff into the lungs daily., Disp: 180 each, Rfl: 3   ipratropium (ATROVENT) 0.03 % nasal spray, Place 2 sprays into both nostrils 3 (three) times daily., Disp: 30 mL, Rfl: 5   ivabradine (CORLANOR) 5 MG TABS  tablet, Take 1 tablet (5 mg total) by mouth 2 (two) times daily with a meal., Disp: 180 tablet, Rfl: 3   loratadine (CLARITIN) 10 MG tablet, Take 10 mg by mouth daily., Disp: , Rfl:    LORazepam (ATIVAN) 1 MG tablet, Take 1 tablet (1 mg total) by mouth daily at 8 pm., Disp: 10 tablet, Rfl: 0   losartan (COZAAR) 25 MG tablet, Take 1 tablet (25 mg total) by mouth every evening., Disp: 90 tablet, Rfl: 3   metoprolol succinate (TOPROL-XL) 50 MG 24 hr tablet, Take 1 tablet (50 mg total) by mouth in the morning., Disp: 90 tablet, Rfl: 3   mirtazapine (REMERON) 30 MG tablet, Take 30 mg by mouth at bedtime., Disp: , Rfl:    montelukast (SINGULAIR) 10 MG tablet, Take 10 mg by mouth daily., Disp: , Rfl:    Multiple Vitamins-Minerals (MULTI VITAMIN/MINERALS) TABS, Take 1 tablet by mouth daily., Disp: , Rfl:    pantoprazole (PROTONIX) 40 MG tablet, Take 40 mg by mouth 2 (two) times daily., Disp: , Rfl:    Polyethyl Glycol-Propyl Glycol (SYSTANE OP), Place 1 drop into both eyes 3 (three) times daily., Disp: , Rfl:    polyethylene glycol (MIRALAX / GLYCOLAX) 17 g packet, Take 17 g by mouth daily. (Patient taking differently: Take 17 g by mouth daily as needed for moderate constipation.), Disp: 14 each, Rfl: 0   raloxifene (EVISTA) 60 MG tablet,  Take 60 mg by mouth daily., Disp: , Rfl:    simvastatin (ZOCOR) 20 MG tablet, Take 20 mg by mouth at bedtime. , Disp: , Rfl:    spironolactone (ALDACTONE) 25 MG tablet, Take 0.5 tablets (12.5 mg total) by mouth daily., Disp: 45 tablet, Rfl: 3   triamcinolone (NASACORT ALLERGY 24HR) 55 MCG/ACT AERO nasal inhaler, Place 2 sprays into the nose daily., Disp: , Rfl:    venlafaxine XR (EFFEXOR-XR) 150 MG 24 hr capsule, Take 150 mg by mouth daily with breakfast., Disp: , Rfl:    Cardiovascular & other pertient studies:  Reviewed external labs and tests, independently interpreted  EKG 07/03/2023: Sinus Brhythm 54 bpm First degree A-V block  LBBB Possible old anteroseptal  infarct   Echocardiogram 12/27/2022: Mildly depressed LV systolic function with visual EF 40-45%. Left ventricle cavity is normal in size. Normal left ventricular wall thickness. Presence of a septal bulge. Doppler evidence of grade I (impaired) diastolic dysfunction, elevated LAP. Left ventricle regional wall motion findings: Basal anteroseptal, Basal inferoseptal, Mid anteroseptal, Mid inferoseptal and Apical septal hypokinesis. Mild tricuspid regurgitation. No evidence of pulmonary hypertension. Compared to 09/06/2022 LVEF improved from 20-25% to 40-45%, mild LAE and trivial pericardial effusion not appreciated on current study.  CTA chest 03/16/2022: 1. No pulmonary embolus. 2. Interval development of scattered subpleural nodular-like hypodense airspace opacities. Finding could represent infection/inflammation versus true nodules. Non-contrast chest CT at 3-6 months is recommended. If the nodules are stable at time of repeat CT, then future CT at 18-24 months (from today's scan) is considered optional for low-risk patients, but is recommended for high-risk patients. This recommendation follows the consensus statement: Guidelines for Management of Incidental Pulmonary Nodules Detected on CT Images: From the Fleischner Society 2017; Radiology 2017; 284:228-243. 3. Trace right pleural effusion. 4. Upper limits of normal mediastinal lymph nodes likely reactive in etiology. Recommend attention on follow-up. 5. Aortic Atherosclerosis (ICD10-I70.0) and Emphysema (ICD10-J43.9).      Recent labs: 09/08/2022: Glucose 73, BUN/Cr 14/0.6. EGFR >60. Na/K 136/3.5. Rest of the CMP normal H/H 12/37. MCV 100. Platelets 210 HbA1C NA Chol 109, TG 49, HDL 44, LDL 54 TSH 1.2 normal    Review of Systems  Cardiovascular:  Negative for chest pain, dyspnea on exertion, leg swelling, palpitations and syncope.        Vitals:   07/03/23 1028  BP: (!) 135/52  Pulse: (!) 59  Resp: 16  SpO2:  94%    Body mass index is 19.73 kg/m. Filed Weights   07/03/23 1028  Weight: 101 lb (45.8 kg)     Objective:   Physical Exam Vitals and nursing note reviewed.  Constitutional:      General: She is not in acute distress. Neck:     Vascular: No JVD.  Cardiovascular:     Rate and Rhythm: Normal rate and regular rhythm.     Heart sounds: Normal heart sounds. No murmur heard. Pulmonary:     Effort: Pulmonary effort is normal.     Breath sounds: Normal breath sounds. No wheezing or rales.  Musculoskeletal:     Right lower leg: No edema.     Left lower leg: No edema.  Skin:    Comments: Bluish discoloration of all toes, no pain no open wounds or ulcers.             Visit diagnoses:   ICD-10-CM   1. Essential hypertension  I10 EKG 12-Lead    2. HFrEF (heart failure with reduced ejection fraction) (HCC)  I50.20        Orders Placed This Encounter  Procedures   EKG 12-Lead    Meds ordered this encounter  Medications   spironolactone (ALDACTONE) 25 MG tablet    Sig: Take 0.5 tablets (12.5 mg total) by mouth daily.    Dispense:  45 tablet    Refill:  3   simvastatin (ZOCOR) 20 MG tablet    Sig: Take 1 tablet (20 mg total) by mouth at bedtime.    Dispense:  90 tablet    Refill:  3   losartan (COZAAR) 25 MG tablet    Sig: Take 1 tablet (25 mg total) by mouth every evening.    Dispense:  90 tablet    Refill:  3   metoprolol succinate (TOPROL-XL) 50 MG 24 hr tablet    Sig: Take 1 tablet (50 mg total) by mouth in the morning.    Dispense:  90 tablet    Refill:  3   ivabradine (CORLANOR) 5 MG TABS tablet    Sig: Take 1 tablet (5 mg total) by mouth 2 (two) times daily with a meal.    Dispense:  180 tablet    Refill:  3    CaseId:[id number];Status:Approved;Review Type:Prior Auth;Coverage Start Date:08/25/2022;Coverage End Date:11/25/2098   aspirin 81 MG chewable tablet    Sig: Chew 1 tablet (81 mg total) by mouth daily with lunch.    Dispense:  90 tablet     Refill:  3   empagliflozin (JARDIANCE) 10 MG TABS tablet    Sig: Take 1 tablet (10 mg total) by mouth daily.    Dispense:  90 tablet    Refill:  3     Assessment & Recommendations:   87 y.o. Caucasian female, with hypertension, hyperlipidemia, extensive coronary atherosclerosis, HFrEF, former smoker with COPD, extensive emphysematous changes, severe protein calorie malnutrition, depression.    HFrEF: Could be multifactorial.  EF improved to 45%.  Clinically euvolemic.  Given overall stability, continue same medications without any change.  Refilled losartan, spironolactone, Jardiance, metoprolol, ivabradine.  CAD: Extensive atherosclerosis noted on CT scan.  No anginal symptoms. Continue aspirin and simvastatin, with which lipids are very well-controlled.  Bluish discoloration of toes: Intact pedal pulses, suspect small vessel disease given history of smoking.  No ulcers, no pain.  Continue current medical therapy.  F/u in 1 year     Elder Negus, MD Pager: [phone] Office: [phone]

## 2023-07-04 NOTE — Telephone Encounter (Signed)
I sent urgent message to [facility] asking them to check on this order that was placed on 7/15 & confirmed on 7/18

## 2023-07-05 NOTE — Telephone Encounter (Signed)
I received a message from Cyndi Lennert with [facility] "Health care specialist will reach out today"

## 2023-07-09 DIAGNOSIS — F411 Generalized anxiety disorder: Secondary | ICD-10-CM | POA: Diagnosis not present

## 2023-07-09 DIAGNOSIS — F331 Major depressive disorder, recurrent, moderate: Secondary | ICD-10-CM | POA: Diagnosis not present

## 2023-07-25 ENCOUNTER — Telehealth: Payer: Self-pay | Admitting: Nurse Practitioner

## 2023-07-25 NOTE — Telephone Encounter (Signed)
I tried to contact the patient by phone. I get a recording saying our number is blocked and to hang up. I have sent the patient a MyChart Message with the results.

## 2023-07-31 DIAGNOSIS — R42 Dizziness and giddiness: Secondary | ICD-10-CM | POA: Diagnosis not present

## 2023-07-31 DIAGNOSIS — H903 Sensorineural hearing loss, bilateral: Secondary | ICD-10-CM | POA: Diagnosis not present

## 2023-08-07 DIAGNOSIS — L570 Actinic keratosis: Secondary | ICD-10-CM | POA: Diagnosis not present

## 2023-08-07 DIAGNOSIS — K219 Gastro-esophageal reflux disease without esophagitis: Secondary | ICD-10-CM | POA: Diagnosis not present

## 2023-08-07 DIAGNOSIS — J449 Chronic obstructive pulmonary disease, unspecified: Secondary | ICD-10-CM | POA: Diagnosis not present

## 2023-08-07 DIAGNOSIS — F324 Major depressive disorder, single episode, in partial remission: Secondary | ICD-10-CM | POA: Diagnosis not present

## 2023-08-07 DIAGNOSIS — Z0000 Encounter for general adult medical examination without abnormal findings: Secondary | ICD-10-CM | POA: Diagnosis not present

## 2023-08-07 DIAGNOSIS — I10 Essential (primary) hypertension: Secondary | ICD-10-CM | POA: Diagnosis not present

## 2023-08-07 DIAGNOSIS — Z23 Encounter for immunization: Secondary | ICD-10-CM | POA: Diagnosis not present

## 2023-08-07 DIAGNOSIS — I255 Ischemic cardiomyopathy: Secondary | ICD-10-CM | POA: Diagnosis not present

## 2023-08-07 DIAGNOSIS — H9193 Unspecified hearing loss, bilateral: Secondary | ICD-10-CM | POA: Diagnosis not present

## 2023-08-07 DIAGNOSIS — E7800 Pure hypercholesterolemia, unspecified: Secondary | ICD-10-CM | POA: Diagnosis not present

## 2023-08-07 DIAGNOSIS — J301 Allergic rhinitis due to pollen: Secondary | ICD-10-CM | POA: Diagnosis not present

## 2023-08-07 DIAGNOSIS — F419 Anxiety disorder, unspecified: Secondary | ICD-10-CM | POA: Diagnosis not present

## 2023-08-16 ENCOUNTER — Other Ambulatory Visit: Payer: Self-pay | Admitting: Family Medicine

## 2023-08-16 DIAGNOSIS — M810 Age-related osteoporosis without current pathological fracture: Secondary | ICD-10-CM

## 2023-08-23 ENCOUNTER — Encounter: Payer: Self-pay | Admitting: Family Medicine

## 2023-09-30 DIAGNOSIS — F331 Major depressive disorder, recurrent, moderate: Secondary | ICD-10-CM | POA: Diagnosis not present

## 2023-09-30 DIAGNOSIS — F411 Generalized anxiety disorder: Secondary | ICD-10-CM | POA: Diagnosis not present

## 2023-10-05 ENCOUNTER — Ambulatory Visit
Admission: EM | Admit: 2023-10-05 | Discharge: 2023-10-05 | Disposition: A | Discharge status: Home / Self Care | Payer: Medicare Other | Attending: Internal Medicine | Admitting: Internal Medicine

## 2023-10-05 DIAGNOSIS — J441 Chronic obstructive pulmonary disease with (acute) exacerbation: Secondary | ICD-10-CM | POA: Diagnosis not present

## 2023-10-05 DIAGNOSIS — J0100 Acute maxillary sinusitis, unspecified: Secondary | ICD-10-CM | POA: Diagnosis not present

## 2023-10-05 MED ORDER — BENZONATATE 100 MG PO CAPS
100.0000 mg | ORAL_CAPSULE | Freq: Three times a day (TID) | ORAL | 0 refills | Status: DC
Start: 2023-10-05 — End: 2024-03-09

## 2023-10-05 MED ORDER — PREDNISONE 20 MG PO TABS: 20.0000 mg | ORAL_TABLET | Freq: Every day | ORAL | 0 refills | Status: AC

## 2023-10-05 MED ORDER — CEFDINIR 300 MG PO CAPS
300.0000 mg | ORAL_CAPSULE | Freq: Two times a day (BID) | ORAL | 0 refills | Status: AC
Start: 2023-10-05 — End: 2023-10-15

## 2023-10-05 NOTE — Discharge Instructions (Addendum)
Start cefdinir twice daily for 10 days.  Tessalon as needed for cough.  Start prednisone daily for 5 days.  Continue your inhalers as needed.  Please monitor your oxygen saturation at home and wear your oxygen until your symptoms improve.  Please follow-up with your PCP in 2 days for recheck.  Please go to the ER for you develop any worsening symptoms.  I hope you feel better soon!

## 2023-10-05 NOTE — ED Provider Notes (Signed)
[facility]    CSN: [id number] Arrival date & time: 10/05/23  1019      History   Chief Complaint Chief Complaint  Patient presents with   Sinus Problem    HPI Carol [name] is a 87 y.o. female  presents for evaluation of URI symptoms for 1-2 weeks. Patient reports associated symptoms of sinus pressure/pain with purulent nasal discharge, postnasal drip, fever starting last night of 100 degrees, cough with purulent sputum. Denies N/V/D, sore throat, ear pain, body aches.  Does have a history of COPD wears oxygen at home only as needed.  States without oxygen her O2 typically runs in the 80s.  She denies any shortness of breath at this time.  Last hospitalization for her COPD was in October 2023, greater than 1 year ago.  Reports no sick contacts.  Pt has taken Tylenol OTC for symptoms. Pt has no other concerns at this time.    Sinus Problem    Past Medical History:  Diagnosis Date   Anxiety    Arthritis    arthritis -hips   Colitis    Complication of anesthesia    B/P dropped post colonoscopy x1   GERD (gastroesophageal reflux disease)    Headache(784.0)    migraines rare occ.   Hypertension    Urinary tract infection early aug 2014    Patient Active Problem List   Diagnosis Date Noted   Dizziness 07/31/2023   Epigastric abdominal pain 05/21/2023   Hyperlipidemia LDL goal <70 09/26/2022   Acute on chronic systolic CHF (congestive heart failure) (HCC) 09/12/2022   AKI (acute kidney injury) (HCC) 09/12/2022   Depression 09/12/2022   Elevated troponin level not due to acute coronary syndrome 09/09/2022   Hypertensive heart disease with acute combined systolic and diastolic congestive heart failure (HCC) 09/09/2022   Protein-calorie malnutrition, severe 09/08/2022   Cardiomyopathy (HCC)    HFrEF (heart failure with reduced ejection fraction) (HCC)    Mixed hyperlipidemia    Cigarette smoker    Atherosclerosis of aorta (HCC)    Coronary  atherosclerosis due to calcified coronary lesion    AMS (altered mental status) 09/06/2022   Elevated lactic acid level    Elevated troponin    Hypotension    Metabolic encephalopathy    Goals of care, counseling/discussion    Chronic respiratory failure with hypoxia (HCC) 09/03/2022   Weight loss 09/03/2022   Protein-calorie malnutrition (HCC) 03/20/2022   Hyponatremia 03/17/2022   Abnormal CT of the chest 03/17/2022   COPD exacerbation (HCC) 03/16/2022   Vasomotor rhinitis 01/23/2021   Sensorineural hearing loss, bilateral 01/23/2021   Centrilobular emphysema (HCC) 04/27/2019   Low vitamin B12 level 04/27/2019   Gastrointestinal hemorrhage    Hematochezia 04/24/2019   Tobacco abuse 04/24/2019   Anxiety 04/24/2019   Osteoporosis 04/24/2019   Essential hypertension 04/24/2019   Cystitis 04/24/2019   Tinea cruris 04/24/2019   Eustachian tube dysfunction, bilateral 04/09/2018   Colitis 01/04/2018   Rectal bleeding 01/04/2018   GERD (gastroesophageal reflux disease) 01/04/2018    Past Surgical History:  Procedure Laterality Date   BIOPSY  05/21/2023   Procedure: BIOPSY;  Surgeon: Charlott Rakes, MD;  Location: [facility];  Service: Gastroenterology;;   CATARACT EXTRACTION, BILATERAL Bilateral    COLONOSCOPY WITH PROPOFOL N/A 08/03/2013   Procedure: COLONOSCOPY WITH PROPOFOL;  Surgeon: Vertell Novak., MD;  Location: [facility];  Service: Endoscopy;  Laterality: N/A;  ultra thin colon scope   COLONOSCOPY WITH PROPOFOL N/A 02/04/2018  Procedure: COLONOSCOPY WITH PROPOFOL;  Surgeon: Carman Ching, MD;  Location: [facility];  Service: Endoscopy;  Laterality: N/A;   ESOPHAGOGASTRODUODENOSCOPY (EGD) WITH PROPOFOL N/A 05/21/2023   Procedure: ESOPHAGOGASTRODUODENOSCOPY (EGD) WITH PROPOFOL;  Surgeon: Charlott Rakes, MD;  Location: [facility];  Service: Gastroenterology;  Laterality: N/A;   LASIK      OB History   No obstetric history on file.      Home  Medications    Prior to Admission medications   Medication Sig Start Date End Date Taking? Authorizing Provider  acetaminophen (TYLENOL 8 HOUR ARTHRITIS PAIN) 650 MG CR tablet Take 650-1,300 mg by mouth every 8 (eight) hours as needed for pain. 02/01/09  Yes [provider]  acetaminophen (TYLENOL) 650 MG CR tablet Take 1,300 mg by mouth daily as needed for pain.     [provider]  aspirin 81 MG chewable tablet Chew 1 tablet (81 mg total) by mouth daily with lunch. 07/03/23   Patwardhan, Anabel Bene, MD  benzonatate (TESSALON) 100 MG capsule Take 1 capsule (100 mg total) by mouth every 8 (eight) hours. 10/05/23  Yes Radford Pax, NP  cefdinir (OMNICEF) 300 MG capsule Take 1 capsule (300 mg total) by mouth 2 (two) times daily for 10 days. 10/05/23 10/15/23 Yes Radford Pax, NP  chlorpheniramine (CHLOR-TRIMETON) 4 MG tablet Take 4 mg by mouth daily as needed for allergies.    [provider]  empagliflozin (JARDIANCE) 10 MG TABS tablet Take 1 tablet (10 mg total) by mouth daily. 07/03/23   Patwardhan, Anabel Bene, MD  famotidine (PEPCID) 10 MG tablet Take 20 mg by mouth daily as needed for heartburn or indigestion.    [provider]  Fluticasone-Umeclidin-Vilant (TRELEGY ELLIPTA) 100-62.5-25 MCG/ACT AEPB Inhale 1 puff into the lungs daily. 11/15/22   Oretha Milch, MD  ipratropium (ATROVENT) 0.03 % nasal spray Place 2 sprays into both nostrils 3 (three) times daily. 06/10/23   Cobb, Ruby Cola, NP  ivabradine (CORLANOR) 5 MG TABS tablet Take 1 tablet (5 mg total) by mouth 2 (two) times daily with a meal. 07/03/23   Patwardhan, Manish J, MD  loratadine (CLARITIN) 10 MG tablet Take 10 mg by mouth daily.    [provider]  LORazepam (ATIVAN) 1 MG tablet Take 1 tablet (1 mg total) by mouth daily at 8 pm. 09/12/22   Arrien, York Ram, MD  losartan (COZAAR) 25 MG tablet Take 1 tablet (25 mg total) by mouth every evening. 07/03/23   Patwardhan, Anabel Bene, MD   metoprolol succinate (TOPROL-XL) 50 MG 24 hr tablet Take 1 tablet (50 mg total) by mouth in the morning. 07/03/23   Patwardhan, Anabel Bene, MD  mirtazapine (REMERON) 30 MG tablet Take 30 mg by mouth at bedtime.    [provider]  montelukast (SINGULAIR) 10 MG tablet Take 10 mg by mouth daily.    [provider]  Multiple Vitamins-Minerals (MULTI VITAMIN/MINERALS) TABS Take 1 tablet by mouth daily.    [provider]  pantoprazole (PROTONIX) 40 MG tablet Take 40 mg by mouth 2 (two) times daily. 05/23/22   [provider]  Polyethyl Glycol-Propyl Glycol (SYSTANE OP) Place 1 drop into both eyes 3 (three) times daily.    [provider]  polyethylene glycol (MIRALAX / GLYCOLAX) 17 g packet Take 17 g by mouth daily. Patient taking differently: Take 17 g by mouth daily as needed for moderate constipation. 09/15/22   Arrien, York Ram, MD  predniSONE (DELTASONE) 20 MG tablet  Take 1 tablet (20 mg total) by mouth daily with breakfast for 5 days. 10/05/23 10/10/23 Yes Radford Pax, NP  raloxifene (EVISTA) 60 MG tablet Take 60 mg by mouth daily. 12/30/17   [provider]  simvastatin (ZOCOR) 20 MG tablet Take 1 tablet (20 mg total) by mouth at bedtime. 07/03/23   Patwardhan, Anabel Bene, MD  spironolactone (ALDACTONE) 25 MG tablet Take 0.5 tablets (12.5 mg total) by mouth daily. 07/03/23   Patwardhan, Anabel Bene, MD  triamcinolone (NASACORT ALLERGY 24HR) 55 MCG/ACT AERO nasal inhaler Place 2 sprays into the nose daily.    [provider]  triamcinolone ointment (KENALOG) 0.5 % Apply 1 Application topically 2 (two) times daily. 05/22/19   [provider]  venlafaxine XR (EFFEXOR-XR) 150 MG 24 hr capsule Take 150 mg by mouth daily with breakfast. 01/30/23   [provider]    Family History History reviewed. No pertinent family history.  Social History Social History   Tobacco Use   Smoking status: Former    Current packs/day: 0.00     Average packs/day: 0.3 packs/day for 40.0 years (10.0 ttl pk-yrs)    Types: Cigarettes    Start date: 09/05/1982    Quit date: 09/05/2022    Years since quitting: 1.0   Smokeless tobacco: Never  Vaping Use   Vaping status: Never Used  Substance Use Topics   Alcohol use: No   Drug use: No     Allergies   Ciprofloxacin, Nitrofuran derivatives, Paxil [paroxetine], Penicillins, and Sulfa antibiotics   Review of Systems Review of Systems  Constitutional:  Positive for fever.  HENT:  Positive for congestion, postnasal drip, sinus pressure and sinus pain.   Respiratory:  Positive for cough.      Physical Exam Triage Vital Signs ED Triage Vitals  Encounter Vitals Group     BP 10/05/23 1039 122/69     Systolic BP Percentile --      Diastolic BP Percentile --      Pulse Rate 10/05/23 1039 80     Resp 10/05/23 1039 (!) 44     Temp 10/05/23 1039 98.3 F (36.8 C)     Temp Source 10/05/23 1039 Oral     SpO2 10/05/23 1039 (!) 81 %     Weight 10/05/23 1037 100 lb 15.5 oz (45.8 kg)     Height 10/05/23 1037 5' (1.524 m)     Head Circumference --      Peak Flow --      Pain Score 10/05/23 1034 7     Pain Loc --      Pain Education --      Exclude from Growth Chart --    No data found.  Updated Vital Signs BP 122/69 (BP Location: Left Arm)   Pulse 80   Temp 98.3 F (36.8 C) (Oral)   Resp (!) 40   Ht 5' (1.524 m)   Wt 100 lb 15.5 oz (45.8 kg)   SpO2 90%   BMI 19.72 kg/m   Visual Acuity Right Eye Distance:   Left Eye Distance:   Bilateral Distance:    Right Eye Near:   Left Eye Near:    Bilateral Near:     Physical Exam Vitals and nursing note reviewed.  Constitutional:      General: She is not in acute distress.    Appearance: Normal appearance. She is well-developed. She is not ill-appearing, toxic-appearing or diaphoretic.  HENT:     Head: Normocephalic and  atraumatic.     Right Ear: Tympanic membrane and ear canal normal.     Left Ear: Tympanic  membrane and ear canal normal.     Nose: Rhinorrhea present. No congestion.     Mouth/Throat:     Mouth: Mucous membranes are moist.     Pharynx: Oropharynx is clear. Uvula midline. No oropharyngeal exudate or posterior oropharyngeal erythema.     Tonsils: No tonsillar exudate or tonsillar abscesses.  Eyes:     Conjunctiva/sclera: Conjunctivae normal.     Pupils: Pupils are equal, round, and reactive to light.  Cardiovascular:     Rate and Rhythm: Normal rate and regular rhythm.     Heart sounds: Normal heart sounds.  Pulmonary:     Effort: Pulmonary effort is normal. No respiratory distress.     Breath sounds: Normal breath sounds. No stridor. No wheezing, rhonchi or rales.  Chest:     Chest wall: No tenderness.  Musculoskeletal:     Cervical back: Normal range of motion and neck supple.  Lymphadenopathy:     Cervical: No cervical adenopathy.  Skin:    General: Skin is warm and dry.  Neurological:     General: No focal deficit present.     Mental Status: She is alert and oriented to person, place, and time.  Psychiatric:        Mood and Affect: Mood normal.        Behavior: Behavior normal.      UC Treatments / Results  Labs (all labs ordered are listed, but only abnormal results are displayed) Labs Reviewed - No data to display  EKG   Radiology No results found.  Procedures Procedures (including critical care time)  Medications Ordered in UC Medications - No data to display  Initial Impression / Assessment and Plan / UC Course  I have reviewed the triage vital signs and the nursing notes.  Pertinent labs & imaging results that were available during my care of the patient were reviewed by me and considered in my medical decision making (see chart for details).     Reviewed exam and symptoms with patient and family.  Patient O2 on intake was 80 to 81% on room air.  She was placed on 2 L nasal cannula and O2 did come up to 90%.  Discussed with patient sinus  infection plus COPD exacerbation.  Given allergies will start cefdinir twice daily for 10 days.  Will do low-dose prednisone for 5 days given her history of CHF.  Last EF was 45%.  Tessalon as needed for cough.  Advise she wear her oxygen at home consistently until her symptoms improve.  She is to follow-up with her PCP in 2 days for recheck.  Strict ER precautions reviewed and patient verbalized understanding. Final Clinical Impressions(s) / UC Diagnoses   Final diagnoses:  Acute maxillary sinusitis, recurrence not specified  COPD exacerbation [facility])     Discharge Instructions      Start cefdinir twice daily for 10 days.  Tessalon as needed for cough.  Start prednisone daily for 5 days.  Continue your inhalers as needed.  Please monitor your oxygen saturation at home and wear your oxygen until your symptoms improve.  Please follow-up with your PCP in 2 days for recheck.  Please go to the ER for you develop any worsening symptoms.  I hope you feel better soon!    ED Prescriptions     Medication Sig Dispense Auth. Provider   cefdinir (OMNICEF) 300 MG  capsule Take 1 capsule (300 mg total) by mouth 2 (two) times daily for 10 days. 20 capsule Radford Pax, NP   benzonatate (TESSALON) 100 MG capsule Take 1 capsule (100 mg total) by mouth every 8 (eight) hours. 21 capsule Radford Pax, NP   predniSONE (DELTASONE) 20 MG tablet Take 1 tablet (20 mg total) by mouth daily with breakfast for 5 days. 5 tablet Radford Pax, NP      PDMP not reviewed this encounter.   Radford Pax, NP 10/05/23 1134

## 2023-10-05 NOTE — ED Triage Notes (Signed)
"  I am having sinus problems, pain, pressure, started at the first of the week this past week, Fever & chills starting yesterday, last known Fever up to 101 today". "My ears have even been hurting".

## 2023-10-11 DIAGNOSIS — J019 Acute sinusitis, unspecified: Secondary | ICD-10-CM | POA: Diagnosis not present

## 2023-12-02 DIAGNOSIS — K219 Gastro-esophageal reflux disease without esophagitis: Secondary | ICD-10-CM | POA: Diagnosis not present

## 2023-12-03 ENCOUNTER — Telehealth: Payer: Self-pay | Admitting: Neurology

## 2023-12-03 NOTE — Telephone Encounter (Signed)
 Pt r/s appt. Wait listed

## 2023-12-05 ENCOUNTER — Ambulatory Visit: Payer: Medicare Other | Admitting: Neurology

## 2023-12-18 ENCOUNTER — Other Ambulatory Visit (HOSPITAL_BASED_OUTPATIENT_CLINIC_OR_DEPARTMENT_OTHER): Payer: Self-pay

## 2023-12-18 MED ORDER — TRELEGY ELLIPTA 100-62.5-25 MCG/ACT IN AEPB: 1.0000 | INHALATION_SPRAY | Freq: Every day | RESPIRATORY_TRACT | 3 refills | Status: AC

## 2023-12-23 ENCOUNTER — Other Ambulatory Visit: Payer: Self-pay | Admitting: Family Medicine

## 2023-12-23 DIAGNOSIS — Z1231 Encounter for screening mammogram for malignant neoplasm of breast: Secondary | ICD-10-CM

## 2023-12-31 ENCOUNTER — Encounter (HOSPITAL_BASED_OUTPATIENT_CLINIC_OR_DEPARTMENT_OTHER): Payer: Self-pay | Admitting: Pulmonary Disease

## 2023-12-31 ENCOUNTER — Ambulatory Visit (HOSPITAL_BASED_OUTPATIENT_CLINIC_OR_DEPARTMENT_OTHER): Payer: Medicare Other | Admitting: Pulmonary Disease

## 2023-12-31 DIAGNOSIS — J9611 Chronic respiratory failure with hypoxia: Secondary | ICD-10-CM

## 2023-12-31 DIAGNOSIS — J449 Chronic obstructive pulmonary disease, unspecified: Secondary | ICD-10-CM | POA: Diagnosis not present

## 2023-12-31 NOTE — Progress Notes (Signed)
 Subjective:    Patient ID: Carol  [name], female    DOB: 08-08-1936, 88 y.o.   MRN: [id number]  HPI  88 yo smoker for FU of chronic hypoxic respiratory failure and emphysema  -on O2 since hosp discharge 02/2022 for COPD exacerbation and pneumonia    PMH - severe anxiety/depression, GERD, hyperlipidemia, hypertension  HFrEF  admitted from 08/2022 for altered mental status. she was found unresponsive at home by her daughter who administered CPR briefly and called EMS  -felt to be due to polypharmacy, she was on high-dose lorazepam  1 mg 4 times daily Echo>> EF; 20-25%.  She was started on goal-directed therapy and on follow-up in February, echo showed EF improved to 40 to 45%.    Discussed the use of AI scribe software for clinical note transcription with the patient, who gave verbal consent to proceed.  Discussed the use of AI scribe software for clinical note transcription with the patient, who gave verbal consent to proceed.  History of Present Illness   The patient, with a history of COPD, has been on nocturnal oxygen  therapy since a hospitalization in October 2023. The patient reports no significant changes in her breathing and does not feel the need for oxygen  during the day. She has been using a portable oxygen  device at night instead of the larger device. The patient also mentions having a sinus infection but it did not affect her breathing. The patient is also on Trelegy for COPD and reports that it is working well for her.       Significant tests/ events reviewed   07/10/2023 ONO on room air: 10 min 23 sec </88%. SpO2 low 76%, average 86%  Ambulatory saturation 08/2022 started at 92% on room air, dropped to 78% on ambulation and required 3 L to maintain 93%   06/2022 CT chest without contrast >> extensive emphysema, prior nodular infiltrates and small right pleural effusion resolved , mild pericardial effusion slight increased   02/2022 CT chest significant for sup pleural  opacities   08/2022 esophagram >> Moderate dysmotilit    Review of Systems neg for any significant sore throat, dysphagia, itching, sneezing, nasal congestion or excess/ purulent secretions, fever, chills, sweats, unintended wt loss, pleuritic or exertional cp, hempoptysis, orthopnea pnd or change in chronic leg swelling. Also denies presyncope, palpitations, heartburn, abdominal pain, nausea, vomiting, diarrhea or change in bowel or urinary habits, dysuria,hematuria, rash, arthralgias, visual complaints, headache, numbness weakness or ataxia.     Objective:   Physical Exam  Gen. Pleasant, well-nourished, in no distress, elderly, petite ENT - no thrush, no pallor/icterus,no post nasal drip Neck: No JVD, no thyromegaly, no carotid bruits Lungs: no use of accessory muscles, no dullness to percussion, clear without rales or rhonchi  Cardiovascular: Rhythm regular, heart sounds  normal, no murmurs or gallops, no peripheral edema Musculoskeletal: No deformities, no cyanosis or clubbing         Assessment & Plan:     Assessment and Plan    Chronic Obstructive Pulmonary Disease (COPD) COPD well-managed with nocturnal oxygen  since hospitalization in October 2023. Reports effective use of Trelegy inhaler every morning and portable oxygen  concentrator at night. Considering returning the larger oxygen  unit due to space constraints. Mild nocturnal desaturation observed but not significantly harmful. Emphasized the importance of having oxygen  available during illness or exacerbations. - Continue Trelegy inhaler every morning - Follow up every six months - Ensure refills for Trelegy inhaler   Chronic resp failure with hypoxia, only 10 min  desta on ONO -- Use portable oxygen  concentrator at night - Return the larger oxygen  unit to [facility] Maintenance Good overall health with supportive family. Understands the importance of regular follow-ups to monitor COPD and adjust  treatment as necessary. - Schedule follow-up appointments every six months, alternating between the main office and the closer location for convenience.

## 2023-12-31 NOTE — Patient Instructions (Addendum)
Refills on Trelegy  OK to dc home concentrator Hold on to POC

## 2024-01-01 ENCOUNTER — Telehealth: Payer: Self-pay | Admitting: Pulmonary Disease

## 2024-01-01 NOTE — Telephone Encounter (Signed)
 Message from [employer] regarding order to D/C concentrator:   Carol [name], [place], [address]; [place], [place]; [place], [place]; [place], [place], [place]; 1 other I spoke to this pt this morning, if we pick up her home concentrator we have to pick up all equipment, insurance will not pay for just portability.

## 2024-01-02 ENCOUNTER — Telehealth: Payer: Self-pay | Admitting: Pulmonary Disease

## 2024-01-02 DIAGNOSIS — J9611 Chronic respiratory failure with hypoxia: Secondary | ICD-10-CM

## 2024-01-02 NOTE — Telephone Encounter (Signed)
 Spoke with patient. She states that her insurance told her it was possible to have a POC and not home oxygen . I spoke to [employer] who states that is not true that she has to have home oxygen  as well. Patient has asked that we send the order to D/C all oxygen  and she will purchase a POC out of pocket. I asked if she wanted to wait until she bought one to D/C the oxygen  and she states that she wants an order sent now to have it picked up that she isnt using it anymore anyways. Ok to send?

## 2024-01-02 NOTE — Telephone Encounter (Signed)
 Patient would like portable oxygen  concentrator. Patient uses [employer]. Patient phone number is [phone].

## 2024-01-02 NOTE — Telephone Encounter (Signed)
 [address] [place], [place]; [place], [place]; [place], [place]; [place], [place], [place]; 1 other she technically declined because she wants to keep the portable, she was supposed to call the office.       Previous Messages    ----- Message ----- From: Carolee Aloysius SAILOR Sent: 01/01/2024  12:07 PM EST To: Norene Montel Eleanor LITTIE Myra; Veva Reedy; * Subject: RE: D/C home concentrator                      Message sent to Dr. Jude, Is patient okay with this or did she decline?  Order closed, nothing further needed

## 2024-01-03 DIAGNOSIS — F331 Major depressive disorder, recurrent, moderate: Secondary | ICD-10-CM | POA: Diagnosis not present

## 2024-01-03 DIAGNOSIS — F411 Generalized anxiety disorder: Secondary | ICD-10-CM | POA: Diagnosis not present

## 2024-01-07 NOTE — Telephone Encounter (Signed)
Order placed to D/C oxygen waiting on cosign.

## 2024-01-22 DIAGNOSIS — H353132 Nonexudative age-related macular degeneration, bilateral, intermediate dry stage: Secondary | ICD-10-CM | POA: Diagnosis not present

## 2024-02-04 DIAGNOSIS — M19041 Primary osteoarthritis, right hand: Secondary | ICD-10-CM | POA: Diagnosis not present

## 2024-02-04 DIAGNOSIS — J449 Chronic obstructive pulmonary disease, unspecified: Secondary | ICD-10-CM | POA: Diagnosis not present

## 2024-02-04 DIAGNOSIS — I10 Essential (primary) hypertension: Secondary | ICD-10-CM | POA: Diagnosis not present

## 2024-02-04 DIAGNOSIS — M810 Age-related osteoporosis without current pathological fracture: Secondary | ICD-10-CM | POA: Diagnosis not present

## 2024-02-04 DIAGNOSIS — J301 Allergic rhinitis due to pollen: Secondary | ICD-10-CM | POA: Diagnosis not present

## 2024-02-04 DIAGNOSIS — F419 Anxiety disorder, unspecified: Secondary | ICD-10-CM | POA: Diagnosis not present

## 2024-02-04 DIAGNOSIS — F324 Major depressive disorder, single episode, in partial remission: Secondary | ICD-10-CM | POA: Diagnosis not present

## 2024-02-04 DIAGNOSIS — I5022 Chronic systolic (congestive) heart failure: Secondary | ICD-10-CM | POA: Diagnosis not present

## 2024-02-04 DIAGNOSIS — K219 Gastro-esophageal reflux disease without esophagitis: Secondary | ICD-10-CM | POA: Diagnosis not present

## 2024-02-04 DIAGNOSIS — E7800 Pure hypercholesterolemia, unspecified: Secondary | ICD-10-CM | POA: Diagnosis not present

## 2024-02-10 NOTE — Telephone Encounter (Signed)
 [address] [place], [place]; [place], [place]; [place], [place]; [place], [place], [place]; 1 other she technically declined because she wants to keep the portable, she was supposed to call the office.

## 2024-03-09 ENCOUNTER — Telehealth: Payer: Self-pay | Admitting: Neurology

## 2024-03-09 ENCOUNTER — Encounter: Payer: Self-pay | Admitting: Neurology

## 2024-03-09 ENCOUNTER — Ambulatory Visit (INDEPENDENT_AMBULATORY_CARE_PROVIDER_SITE_OTHER): Payer: Medicare Other | Admitting: Neurology

## 2024-03-09 VITALS — BP 128/74 | HR 72 | Ht 60.0 in | Wt 108.0 lb

## 2024-03-09 DIAGNOSIS — I679 Cerebrovascular disease, unspecified: Secondary | ICD-10-CM | POA: Diagnosis not present

## 2024-03-09 DIAGNOSIS — H539 Unspecified visual disturbance: Secondary | ICD-10-CM | POA: Diagnosis not present

## 2024-03-09 NOTE — Telephone Encounter (Signed)
 no auth required sent to GI [phone]

## 2024-03-09 NOTE — Progress Notes (Addendum)
 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       ASSESSMENT AND PLAN  Carol [name] is a 88 y.o. female   Intermittent episode of visual distortion Cerebrovascular disease  Her complains of " visions upside down" is quite unusual, not sure about the etiology, she certainly has multiple vascular risk factors, we will proceed with  MRI of the brain without contrast   Return To Clinic With NP In 6 Months  DIAGNOSTIC DATA (LABS, IMAGING, TESTING) - I reviewed patient records, labs, notes, testing and imaging myself where available. Pending, reviewed optometrist Donata Fryer Dunn's evaluation on January 22, 2024, complete posterior vitreous detachment was noted bilaterally, healthy macular with no edema or degenerative pigmentation, healthy peripheral retinal structure and vasculature. Optic nerve heads flat with distinct margin and color,  MEDICAL HISTORY:  Carol [name], is a 88 year old female, accompanied by her granddaughter seen in request by her primary care physician Dr. Raquel Cables, Sammie Crigler for evaluation of intermittent visual change, initial evaluation was March 09, 2024  History is obtained from the patient and review of electronic medical records. I personally reviewed pertinent available imaging films in PACS.   PMHx of  DM CHF HTN Depression,  HLD Quit smoke in 2023, 1ppd, COPD  Since 2024 she experienced intermittent episode of visual change, described after a nap, or overnight sleep, woke up she often see imaging upside down, such as TV was upside down on the floor, symptoms last couple minutes, no confusion, vision would go away if she move her neck  She denies memory loss, longtime smoker, develop COPD congestive heart failure, lives alone since 01/03/2008 quit driving since 4782,  CT head from  October 2023 showed confluent periventricular small vessel disease  Laboratory evaluation September 2024 from [place]: Normal TSH 1.80, LDL was 75, normal CMP creatinine of 0.83, CBC hemoglobin of 13.6,  PHYSICAL EXAM:   Vitals:   03/09/24 1435  BP: 128/74  Pulse: 72  Weight: 108 lb (49 kg)  Height: 5' (1.524 m)   Not recorded     Body mass index is 21.09 kg/m.  PHYSICAL EXAMNIATION:  Gen: NAD, conversant, well nourised, well groomed                     Cardiovascular: Regular rate rhythm, no peripheral edema, warm, nontender. Eyes: Conjunctivae clear without exudates or hemorrhage Neck: Supple, no carotid bruits. Pulmonary: Clear to auscultation bilaterally   NEUROLOGICAL EXAM:  MENTAL STATUS: Speech/cognition: Awake, alert, oriented to history taking and casual conversation CRANIAL NERVES: CN II: Visual fields are full to confrontation. Pupils are round equal and briskly reactive to light. CN III, IV, VI: extraocular movement are normal. No ptosis. CN V: Facial sensation is intact to light touch CN VII: Face is symmetric with normal eye closure  CN VIII: Hearing is normal to causal conversation. CN IX, X: Phonation is normal. CN XI: Head turning and shoulder shrug are intact  MOTOR: There is no pronator drift of out-stretched arms. Muscle bulk and tone are normal. Muscle strength is normal.  REFLEXES: Reflexes are 2+ and symmetric at the biceps, triceps, knees, and ankles. Plantar responses are flexor.  SENSORY: Intact to light touch, pinprick and vibratory sensation are intact in fingers and toes.  COORDINATION: There is no trunk or limb dysmetria noted.  GAIT/STANCE: Posture  is normal. Gait is steady with normal steps, base, arm swing, and turning. Heel and toe walking are normal. Tandem gait is normal.  Romberg is absent.  REVIEW OF SYSTEMS:  Full 14 system review of systems performed and notable only for as above All other review of systems were  negative.   ALLERGIES: Allergies  Allergen Reactions   Ciprofloxacin  Nausea Only and Other (See Comments)    Nausea and legs weak   Nitrofuran Derivatives Nausea Only and Other (See Comments)    nitrofuratonin bad nausea, legs weak   Paxil [Paroxetine] Other (See Comments)    Sick on stomach   Penicillins Other (See Comments)    Sick on stomach   Sulfa Antibiotics Other (See Comments)    jittery    HOME MEDICATIONS: Current Outpatient Medications  Medication Sig Dispense Refill   acetaminophen  (TYLENOL  8 HOUR ARTHRITIS PAIN) 650 MG CR tablet Take 650-1,300 mg by mouth every 8 (eight) hours as needed for pain.     acetaminophen  (TYLENOL ) 650 MG CR tablet Take 1,300 mg by mouth daily as needed for pain.      aspirin  81 MG chewable tablet Chew 1 tablet (81 mg total) by mouth daily with lunch. 90 tablet 3   chlorpheniramine (CHLOR-TRIMETON) 4 MG tablet Take 4 mg by mouth daily as needed for allergies.     empagliflozin  (JARDIANCE ) 10 MG TABS tablet Take 1 tablet (10 mg total) by mouth daily. 90 tablet 3   famotidine  (PEPCID ) 10 MG tablet Take 20 mg by mouth daily as needed for heartburn or indigestion.     Fluticasone -Umeclidin-Vilant (TRELEGY ELLIPTA ) 100-62.5-25 MCG/ACT AEPB Inhale 1 puff into the lungs daily. 180 each 3   ipratropium (ATROVENT ) 0.03 % nasal spray Place 2 sprays into both nostrils 3 (three) times daily. 30 mL 5   ivabradine  (CORLANOR ) 5 MG TABS tablet Take 1 tablet (5 mg total) by mouth 2 (two) times daily with a meal. 180 tablet 3   loratadine (CLARITIN) 10 MG tablet Take 10 mg by mouth daily.     LORazepam  (ATIVAN ) 1 MG tablet Take 1 tablet (1 mg total) by mouth daily at 8 pm. 10 tablet 0   losartan  (COZAAR ) 25 MG tablet Take 1 tablet (25 mg total) by mouth every evening. 90 tablet 3   metoprolol  succinate (TOPROL -XL) 50 MG 24 hr tablet Take 1 tablet (50 mg total) by mouth in the morning. 90 tablet 3   mirtazapine  (REMERON ) 30 MG tablet Take 30 mg by mouth at  bedtime.     Multiple Vitamins-Minerals (MULTI VITAMIN/MINERALS) TABS Take 1 tablet by mouth daily.     pantoprazole  (PROTONIX ) 40 MG tablet Take 40 mg by mouth 2 (two) times daily.     Polyethyl Glycol-Propyl Glycol (SYSTANE OP) Place 1 drop into both eyes 3 (three) times daily.     polyethylene glycol (MIRALAX  / GLYCOLAX ) 17 g packet Take 17 g by mouth daily. (Patient taking differently: Take 17 g by mouth daily as needed for moderate constipation.) 14 each 0   raloxifene  (EVISTA ) 60 MG tablet Take 60 mg by mouth daily.     simvastatin  (ZOCOR ) 20 MG tablet Take 1 tablet (20 mg total) by mouth at bedtime. 90 tablet 3   spironolactone  (ALDACTONE ) 25 MG tablet Take 0.5 tablets (12.5 mg total) by mouth daily. 45 tablet 3   triamcinolone (NASACORT ALLERGY 24HR) 55 MCG/ACT AERO nasal inhaler Place 2 sprays into the nose daily.     triamcinolone ointment (KENALOG) 0.5 %  Apply 1 Application topically 2 (two) times daily.     venlafaxine  XR (EFFEXOR -XR) 150 MG 24 hr capsule Take 150 mg by mouth daily with breakfast.     No current facility-administered medications for this visit.    PAST MEDICAL HISTORY: Past Medical History:  Diagnosis Date   Anxiety    Arthritis    arthritis -hips   Colitis    Complication of anesthesia    B/P dropped post colonoscopy x1   GERD (gastroesophageal reflux disease)    Headache(784.0)    migraines rare occ.   Hypertension    Urinary tract infection early aug 2014    PAST SURGICAL HISTORY: Past Surgical History:  Procedure Laterality Date   BIOPSY  05/21/2023   Procedure: BIOPSY;  Surgeon: Baldo Bonds, MD;  Location: [facility];  Service: Gastroenterology;;   CATARACT EXTRACTION, BILATERAL Bilateral    COLONOSCOPY WITH PROPOFOL  N/A 08/03/2013   Procedure: COLONOSCOPY WITH PROPOFOL ;  Surgeon: Venson Ginger., MD;  Location: [facility];  Service: Endoscopy;  Laterality: N/A;  ultra thin colon scope   COLONOSCOPY WITH PROPOFOL  N/A 02/04/2018    Procedure: COLONOSCOPY WITH PROPOFOL ;  Surgeon: Jolinda Necessary, MD;  Location: [facility];  Service: Endoscopy;  Laterality: N/A;   ESOPHAGOGASTRODUODENOSCOPY (EGD) WITH PROPOFOL  N/A 05/21/2023   Procedure: ESOPHAGOGASTRODUODENOSCOPY (EGD) WITH PROPOFOL ;  Surgeon: Baldo Bonds, MD;  Location: [facility];  Service: Gastroenterology;  Laterality: N/A;   LASIK      FAMILY HISTORY: History reviewed. No pertinent family history.  SOCIAL HISTORY: Social History   Socioeconomic History   Marital status: Widowed    Spouse name: Not on file   Number of children: Not on file   Years of education: Not on file   Highest education level: Not on file  Occupational History   Not on file  Tobacco Use   Smoking status: Former    Current packs/day: 0.00    Average packs/day: 0.3 packs/day for 40.0 years (10.0 ttl pk-yrs)    Types: Cigarettes    Start date: 09/05/1982    Quit date: 09/05/2022    Years since quitting: 1.5   Smokeless tobacco: Never  Vaping Use   Vaping status: Never Used  Substance and Sexual Activity   Alcohol use: No   Drug use: No   Sexual activity: Not Currently  Other Topics Concern   Not on file  Social History Narrative   Not on file   Social Drivers of Health   Financial Resource Strain: Not on file  Food Insecurity: Not on file  Transportation Needs: Not on file  Physical Activity: Not on file  Stress: Not on file  Social Connections: Not on file  Intimate Partner Violence: Not on file      Phebe Brasil, M.D. Ph.D.  [facility] [address] [place], [place] [address] Ph: [phone] Fax: [phone]  CC:  Roselind Congo, MD [phone] [address] [place],  [place] [address]  Roselind Congo, MD

## 2024-03-11 ENCOUNTER — Ambulatory Visit
Admission: RE | Admit: 2024-03-11 | Discharge: 2024-03-11 | Disposition: A | Discharge status: Home / Self Care | Payer: Medicare Other | Source: Ambulatory Visit | Attending: Family Medicine | Admitting: Family Medicine

## 2024-03-11 DIAGNOSIS — M810 Age-related osteoporosis without current pathological fracture: Secondary | ICD-10-CM

## 2024-03-11 DIAGNOSIS — Z1231 Encounter for screening mammogram for malignant neoplasm of breast: Secondary | ICD-10-CM | POA: Diagnosis not present

## 2024-03-12 ENCOUNTER — Other Ambulatory Visit: Payer: Self-pay | Admitting: Cardiology

## 2024-03-17 ENCOUNTER — Telehealth: Payer: Self-pay | Admitting: Nurse Practitioner

## 2024-03-17 NOTE — Telephone Encounter (Signed)
 LVMTCB to reschedule office visit with Canary Ceo NP.

## 2024-03-25 DIAGNOSIS — F411 Generalized anxiety disorder: Secondary | ICD-10-CM | POA: Diagnosis not present

## 2024-03-25 DIAGNOSIS — F331 Major depressive disorder, recurrent, moderate: Secondary | ICD-10-CM | POA: Diagnosis not present

## 2024-04-17 ENCOUNTER — Other Ambulatory Visit: Payer: Self-pay

## 2024-04-17 MED ORDER — EMPAGLIFLOZIN 10 MG PO TABS: 10.0000 mg | ORAL_TABLET | Freq: Every day | ORAL | 1 refills | Status: DC

## 2024-04-23 NOTE — Progress Notes (Shared)
 [facility] - Clinic Note  04/24/2024   CHIEF COMPLAINT Patient presents for No chief complaint on file.  HISTORY OF PRESENT ILLNESS: Carol  A [name] is a 88 y.o. female who presents to the clinic today for:   Pt is here on the referral of Dr. 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 Danley Dusky   Referring physician: Roselind Congo, MD [phone] [address] [place],  [place] [address]  HISTORICAL INFORMATION:  Selected notes from the MEDICAL RECORD NUMBER Referred by Dr. 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   No current facility-administered medications for this visit. (Ophthalmic Drugs)   Current Outpatient Medications (Other)  Medication Sig   acetaminophen  (TYLENOL  8 HOUR ARTHRITIS PAIN) 650 MG CR tablet Take 650-1,300 mg by mouth every 8 (eight) hours as needed for pain.   acetaminophen  (TYLENOL ) 650 MG CR tablet Take 1,300 mg by mouth daily as needed for pain.    aspirin  81 MG chewable tablet Chew 1 tablet (81 mg total) by mouth daily with lunch.   chlorpheniramine (CHLOR-TRIMETON) 4 MG tablet Take 4 mg by mouth daily as needed for allergies.   empagliflozin  (JARDIANCE ) 10 MG  TABS tablet Take 1 tablet (10 mg total) by mouth daily.   famotidine  (PEPCID ) 10 MG tablet Take 20 mg by mouth daily as needed for heartburn or indigestion.   Fluticasone -Umeclidin-Vilant (TRELEGY ELLIPTA ) 100-62.5-25 MCG/ACT AEPB Inhale 1 puff into the lungs daily.   ipratropium (ATROVENT ) 0.03 % nasal spray Place 2 sprays into both nostrils 3 (three) times daily.   ivabradine  (CORLANOR ) 5 MG TABS tablet TAKE 1 TABLET TWICE A DAY WITH MEALS   loratadine (CLARITIN) 10 MG tablet Take 10 mg by mouth daily.   LORazepam  (ATIVAN ) 1 MG tablet Take 1 tablet (1 mg total) by mouth daily at 8 pm.   losartan  (COZAAR ) 25 MG tablet Take 1 tablet (25 mg total) by mouth every evening.   metoprolol  succinate (TOPROL -XL) 50 MG 24 hr tablet Take 1 tablet (50 mg total) by mouth in the morning.   mirtazapine  (REMERON ) 30 MG tablet Take 30 mg by mouth at bedtime.   Multiple Vitamins-Minerals (MULTI VITAMIN/MINERALS) TABS Take 1 tablet by mouth daily.   pantoprazole  (PROTONIX ) 40 MG tablet Take 40 mg by mouth 2 (two) times daily.   polyethylene glycol (MIRALAX  / GLYCOLAX ) 17 g packet Take 17 g by mouth daily. (Patient taking differently: Take 17 g by mouth daily as needed for moderate constipation.)   raloxifene  (EVISTA ) 60 MG tablet Take 60 mg by mouth daily.   simvastatin  (ZOCOR ) 20 MG tablet Take 1 tablet (20 mg total)  by mouth at bedtime.   spironolactone  (ALDACTONE ) 25 MG tablet Take 0.5 tablets (12.5 mg total) by mouth daily.   triamcinolone (NASACORT ALLERGY 24HR) 55 MCG/ACT AERO nasal inhaler Place 2 sprays into the nose daily.   triamcinolone ointment (KENALOG) 0.5 % Apply 1 Application topically 2 (two) times daily.   venlafaxine  XR (EFFEXOR -XR) 150 MG 24 hr capsule Take 150 mg by mouth daily with breakfast.   No current facility-administered medications for this visit. (Other)   REVIEW OF SYSTEMS:   ALLERGIES Allergies  Allergen Reactions   Ciprofloxacin  Nausea Only and Other (See Comments)     Nausea and legs weak   Nitrofuran Derivatives Nausea Only and Other (See Comments)    nitrofuratonin bad nausea, legs weak   Paxil [Paroxetine] Other (See Comments)    Sick on stomach   Penicillins Other (See Comments)    Sick on stomach   Sulfa Antibiotics Other (See Comments)    jittery   PAST MEDICAL HISTORY Past Medical History:  Diagnosis Date   Anxiety    Arthritis    arthritis -hips   Colitis    Complication of anesthesia    B/P dropped post colonoscopy x1   GERD (gastroesophageal reflux disease)    Headache(784.0)    migraines rare occ.   Hypertension    Urinary tract infection early aug 2014   Past Surgical History:  Procedure Laterality Date   BIOPSY  05/21/2023   Procedure: BIOPSY;  Surgeon: Baldo Bonds, MD;  Location: [facility];  Service: Gastroenterology;;   CATARACT EXTRACTION, BILATERAL Bilateral    COLONOSCOPY WITH PROPOFOL  N/A 08/03/2013   Procedure: COLONOSCOPY WITH PROPOFOL ;  Surgeon: Venson Ginger., MD;  Location: [facility];  Service: Endoscopy;  Laterality: N/A;  ultra thin colon scope   COLONOSCOPY WITH PROPOFOL  N/A 02/04/2018   Procedure: COLONOSCOPY WITH PROPOFOL ;  Surgeon: Jolinda Necessary, MD;  Location: [facility];  Service: Endoscopy;  Laterality: N/A;   ESOPHAGOGASTRODUODENOSCOPY (EGD) WITH PROPOFOL  N/A 05/21/2023   Procedure: ESOPHAGOGASTRODUODENOSCOPY (EGD) WITH PROPOFOL ;  Surgeon: Baldo Bonds, MD;  Location: [facility];  Service: Gastroenterology;  Laterality: N/A;   LASIK     FAMILY HISTORY No family history on file. SOCIAL HISTORY Social History   Tobacco Use   Smoking status: Former    Current packs/day: 0.00    Average packs/day: 0.3 packs/day for 40.0 years (10.0 ttl pk-yrs)    Types: Cigarettes    Start date: 09/05/1982    Quit date: 09/05/2022    Years since quitting: 1.6   Smokeless tobacco: Never  Vaping Use   Vaping status: Never Used  Substance Use Topics   Alcohol use: No   Drug use: No        OPHTHALMIC EXAM:  Not recorded    IMAGING AND PROCEDURES  Imaging and Procedures for 04/24/2024         ASSESSMENT/PLAN: No diagnosis found.  1. Visual disturbances  - pt reports several month history of episodes with her vision being flipped upside down upon waking  - initially occurred intermittently after naps on the couch but has since progressed to occur after waking in the bedroom  - episodes correct themselves after several minutes and with opening/closing and rubbing eyes.  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 Myopic degeneration OU  - severely myopic contour w/ ellipsoid thinning/loss  - no myopic CNV or fluid  - monitor  - f/u 9-12 months, DFE, OCT  3. Age related macular degeneration, non-exudative, OU  - The incidence, anatomy, and pathology of dry AMD, risk of progression, and the AREDS and AREDS 2 studies including smoking risks discussed with patient.  - Recommend amsler grid monitoring  - f/u 9-12 mos -- DFE/OCT  4,5. Hypertensive retinopathy OU - discussed importance of tight BP control - monitor   6. Pseudophakia OU  - s/p CE/toric IOL OU  (Dr. Danley Dusky)  - IOLs in good position, doing well  - monitor  7. Dry eyes OU - recommend artificial tears and lubricating ointment as needed   Ophthalmic Meds Ordered this visit:  No orders of the defined types were placed in this encounter.    No follow-ups on file.  There are no Patient Instructions on file for this visit.  This document serves as a record of services personally performed by Jeanice Millard, MD, PhD. It was created on their behalf by Eller Gut COT, an ophthalmic technician. The  creation of this record is the provider's dictation and/or activities during the visit.    Electronically signed by: Eller Gut COT 05.29.2025 9:36 AM    Abbreviations: M myopia (nearsighted); A astigmatism; H hyperopia (farsighted); P presbyopia; Mrx spectacle prescription;  CTL contact lenses; OD right eye; OS left eye; OU both eyes  XT exotropia; ET esotropia; PEK punctate epithelial keratitis; PEE punctate epithelial erosions; DES dry eye syndrome; MGD meibomian gland dysfunction; ATs artificial tears; PFAT's preservative free artificial tears; NSC nuclear sclerotic cataract; PSC posterior subcapsular cataract; ERM epi-retinal membrane; PVD posterior vitreous detachment; RD retinal detachment; DM diabetes mellitus; DR diabetic retinopathy; NPDR non-proliferative diabetic retinopathy; PDR proliferative diabetic retinopathy; CSME clinically significant macular edema; DME diabetic macular edema; dbh dot blot hemorrhages; CWS cotton wool spot; POAG primary open angle glaucoma; C/D cup-to-disc ratio; HVF humphrey visual field; GVF goldmann visual field; OCT optical coherence tomography; IOP intraocular pressure; BRVO Branch retinal vein occlusion; CRVO central retinal vein occlusion; CRAO central retinal artery occlusion; BRAO branch retinal artery occlusion; RT retinal tear; SB scleral buckle; PPV pars plana vitrectomy; VH Vitreous hemorrhage; PRP panretinal laser photocoagulation; IVK intravitreal kenalog; VMT vitreomacular traction; MH Macular hole;  NVD neovascularization of the disc; NVE neovascularization elsewhere; AREDS age related eye disease study; ARMD age related macular degeneration; POAG primary open angle glaucoma; EBMD epithelial/anterior basement membrane dystrophy; ACIOL anterior chamber intraocular lens; IOL intraocular lens; PCIOL posterior chamber intraocular lens; Phaco/IOL phacoemulsification with intraocular lens placement; PRK photorefractive keratectomy; LASIK laser assisted  in situ keratomileusis; HTN hypertension; DM diabetes mellitus; COPD chronic obstructive pulmonary disease

## 2024-04-24 ENCOUNTER — Encounter (INDEPENDENT_AMBULATORY_CARE_PROVIDER_SITE_OTHER): Payer: Medicare Other | Admitting: Ophthalmology

## 2024-04-24 DIAGNOSIS — Z961 Presence of intraocular lens: Secondary | ICD-10-CM

## 2024-04-24 DIAGNOSIS — H35033 Hypertensive retinopathy, bilateral: Secondary | ICD-10-CM

## 2024-04-24 DIAGNOSIS — H04123 Dry eye syndrome of bilateral lacrimal glands: Secondary | ICD-10-CM

## 2024-04-24 DIAGNOSIS — H4423 Degenerative myopia, bilateral: Secondary | ICD-10-CM

## 2024-04-24 DIAGNOSIS — H539 Unspecified visual disturbance: Secondary | ICD-10-CM

## 2024-04-24 DIAGNOSIS — H353132 Nonexudative age-related macular degeneration, bilateral, intermediate dry stage: Secondary | ICD-10-CM

## 2024-04-24 DIAGNOSIS — I10 Essential (primary) hypertension: Secondary | ICD-10-CM

## 2024-05-01 ENCOUNTER — Encounter (INDEPENDENT_AMBULATORY_CARE_PROVIDER_SITE_OTHER): Admitting: Ophthalmology

## 2024-05-21 NOTE — Progress Notes (Shared)
 [facility] - Clinic Note  05/22/2024   CHIEF COMPLAINT Patient presents for No chief complaint on file.  HISTORY OF PRESENT ILLNESS: Carol  A [name] is a 88 y.o. female who presents to the clinic today for:   Pt is here on the referral of Dr. 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 Camillo   Referring physician: Arloa Elsie SAUNDERS, MD [phone] [address] [place],  [place] [address]  HISTORICAL INFORMATION:  Selected notes from the MEDICAL RECORD NUMBER Referred by Dr. 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   No current facility-administered medications for this visit. (Ophthalmic Drugs)   Current Outpatient Medications (Other)  Medication Sig   acetaminophen  (TYLENOL  8 HOUR ARTHRITIS PAIN) 650 MG CR tablet Take 650-1,300 mg by mouth every 8 (eight) hours as needed for pain.   acetaminophen  (TYLENOL ) 650 MG CR tablet Take 1,300 mg by mouth daily as needed for pain.    aspirin  81 MG chewable tablet Chew 1 tablet (81 mg total) by mouth daily with lunch.   chlorpheniramine (CHLOR-TRIMETON) 4 MG tablet Take 4 mg by mouth daily as needed for allergies.   empagliflozin  (JARDIANCE ) 10 MG  TABS tablet Take 1 tablet (10 mg total) by mouth daily.   famotidine  (PEPCID ) 10 MG tablet Take 20 mg by mouth daily as needed for heartburn or indigestion.   Fluticasone -Umeclidin-Vilant (TRELEGY ELLIPTA ) 100-62.5-25 MCG/ACT AEPB Inhale 1 puff into the lungs daily.   ipratropium (ATROVENT ) 0.03 % nasal spray Place 2 sprays into both nostrils 3 (three) times daily.   ivabradine  (CORLANOR ) 5 MG TABS tablet TAKE 1 TABLET TWICE A DAY WITH MEALS   loratadine (CLARITIN) 10 MG tablet Take 10 mg by mouth daily.   LORazepam  (ATIVAN ) 1 MG tablet Take 1 tablet (1 mg total) by mouth daily at 8 pm.   losartan  (COZAAR ) 25 MG tablet Take 1 tablet (25 mg total) by mouth every evening.   metoprolol  succinate (TOPROL -XL) 50 MG 24 hr tablet Take 1 tablet (50 mg total) by mouth in the morning.   mirtazapine  (REMERON ) 30 MG tablet Take 30 mg by mouth at bedtime.   Multiple Vitamins-Minerals (MULTI VITAMIN/MINERALS) TABS Take 1 tablet by mouth daily.   pantoprazole  (PROTONIX ) 40 MG tablet Take 40 mg by mouth 2 (two) times daily.   polyethylene glycol (MIRALAX  / GLYCOLAX ) 17 g packet Take 17 g by mouth daily. (Patient taking differently: Take 17 g by mouth daily as needed for moderate constipation.)   raloxifene  (EVISTA ) 60 MG tablet Take 60 mg by mouth daily.   simvastatin  (ZOCOR ) 20 MG tablet Take 1 tablet (20 mg total)  by mouth at bedtime.   spironolactone  (ALDACTONE ) 25 MG tablet Take 0.5 tablets (12.5 mg total) by mouth daily.   triamcinolone (NASACORT ALLERGY 24HR) 55 MCG/ACT AERO nasal inhaler Place 2 sprays into the nose daily.   triamcinolone ointment (KENALOG) 0.5 % Apply 1 Application topically 2 (two) times daily.   venlafaxine  XR (EFFEXOR -XR) 150 MG 24 hr capsule Take 150 mg by mouth daily with breakfast.   No current facility-administered medications for this visit. (Other)   REVIEW OF SYSTEMS:   ALLERGIES Allergies  Allergen Reactions   Ciprofloxacin  Nausea Only and Other (See Comments)     Nausea and legs weak   Nitrofuran Derivatives Nausea Only and Other (See Comments)    nitrofuratonin bad nausea, legs weak   Paxil [Paroxetine] Other (See Comments)    Sick on stomach   Penicillins Other (See Comments)    Sick on stomach   Sulfa Antibiotics Other (See Comments)    jittery   PAST MEDICAL HISTORY Past Medical History:  Diagnosis Date   Anxiety    Arthritis    arthritis -hips   Colitis    Complication of anesthesia    B/P dropped post colonoscopy x1   GERD (gastroesophageal reflux disease)    Headache(784.0)    migraines rare occ.   Hypertension    Urinary tract infection early aug 2014   Past Surgical History:  Procedure Laterality Date   BIOPSY  05/21/2023   Procedure: BIOPSY;  Surgeon: Dianna Specking, MD;  Location: [facility];  Service: Gastroenterology;;   CATARACT EXTRACTION, BILATERAL Bilateral    COLONOSCOPY WITH PROPOFOL  N/A 08/03/2013   Procedure: COLONOSCOPY WITH PROPOFOL ;  Surgeon: Lynwood LITTIE Celestia Mickey., MD;  Location: [facility];  Service: Endoscopy;  Laterality: N/A;  ultra thin colon scope   COLONOSCOPY WITH PROPOFOL  N/A 02/04/2018   Procedure: COLONOSCOPY WITH PROPOFOL ;  Surgeon: Celestia Lynwood, MD;  Location: [facility];  Service: Endoscopy;  Laterality: N/A;   ESOPHAGOGASTRODUODENOSCOPY (EGD) WITH PROPOFOL  N/A 05/21/2023   Procedure: ESOPHAGOGASTRODUODENOSCOPY (EGD) WITH PROPOFOL ;  Surgeon: Dianna Specking, MD;  Location: [facility];  Service: Gastroenterology;  Laterality: N/A;   LASIK     FAMILY HISTORY No family history on file. SOCIAL HISTORY Social History   Tobacco Use   Smoking status: Former    Current packs/day: 0.00    Average packs/day: 0.3 packs/day for 40.0 years (10.0 ttl pk-yrs)    Types: Cigarettes    Start date: 09/05/1982    Quit date: 09/05/2022    Years since quitting: 1.7   Smokeless tobacco: Never  Vaping Use   Vaping status: Never Used  Substance Use Topics   Alcohol use: No   Drug use: No        OPHTHALMIC EXAM:  Not recorded    IMAGING AND PROCEDURES  Imaging and Procedures for 05/22/2024         ASSESSMENT/PLAN: No diagnosis found.  1. Visual disturbances  - pt reports several month history of episodes with her vision being flipped upside down upon waking  - initially occurred intermittently after naps on the couch but has since progressed to occur after waking in the bedroom  - episodes correct themselves after several minutes and with opening/closing and rubbing eyes.  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 Myopic degeneration OU  - severely myopic contour w/ ellipsoid thinning/loss  - no myopic CNV or fluid  - monitor  - f/u 9-12 months, DFE, OCT  3. Age related macular degeneration, non-exudative, OU  - The incidence, anatomy, and pathology of dry AMD, risk of progression, and the AREDS and AREDS 2 studies including smoking risks discussed with patient.  - Recommend amsler grid monitoring  - f/u 9-12 mos -- DFE/OCT  4,5. Hypertensive retinopathy OU - discussed importance of tight BP control - monitor   6. Pseudophakia OU  - s/p CE/toric IOL OU  (Dr. Camillo)  - IOLs in good position, doing well  - monitor  7. Dry eyes OU - recommend artificial tears and lubricating ointment as needed   Ophthalmic Meds Ordered this visit:  No orders of the defined types were placed in this encounter.    No follow-ups on file.  There are no Patient Instructions on file for this visit.  This document serves as a record of services personally performed by Redell JUDITHANN Hans, MD, PhD. It was created on their behalf by Delon Newness COT, an ophthalmic technician. The  creation of this record is the provider's dictation and/or activities during the visit.    Electronically signed by: Delon Newness COT 06.26.25  9:52 AM    Abbreviations: M myopia (nearsighted); A astigmatism; H hyperopia (farsighted); P presbyopia; Mrx spectacle prescription;  CTL contact lenses; OD right eye; OS left eye; OU both eyes  XT exotropia; ET esotropia; PEK punctate epithelial keratitis; PEE punctate epithelial erosions; DES dry eye syndrome; MGD meibomian gland dysfunction; ATs artificial tears; PFAT's preservative free artificial tears; NSC nuclear sclerotic cataract; PSC posterior subcapsular cataract; ERM epi-retinal membrane; PVD posterior vitreous detachment; RD retinal detachment; DM diabetes mellitus; DR diabetic retinopathy; NPDR non-proliferative diabetic retinopathy; PDR proliferative diabetic retinopathy; CSME clinically significant macular edema; DME diabetic macular edema; dbh dot blot hemorrhages; CWS cotton wool spot; POAG primary open angle glaucoma; C/D cup-to-disc ratio; HVF humphrey visual field; GVF goldmann visual field; OCT optical coherence tomography; IOP intraocular pressure; BRVO Branch retinal vein occlusion; CRVO central retinal vein occlusion; CRAO central retinal artery occlusion; BRAO branch retinal artery occlusion; RT retinal tear; SB scleral buckle; PPV pars plana vitrectomy; VH Vitreous hemorrhage; PRP panretinal laser photocoagulation; IVK intravitreal kenalog; VMT vitreomacular traction; MH Macular hole;  NVD neovascularization of the disc; NVE neovascularization elsewhere; AREDS age related eye disease study; ARMD age related macular degeneration; POAG primary open angle glaucoma; EBMD epithelial/anterior basement membrane dystrophy; ACIOL anterior chamber intraocular lens; IOL intraocular lens; PCIOL posterior chamber intraocular lens; Phaco/IOL phacoemulsification with intraocular lens placement; PRK photorefractive keratectomy; LASIK laser assisted  in situ keratomileusis; HTN hypertension; DM diabetes mellitus; COPD chronic obstructive pulmonary disease

## 2024-05-22 ENCOUNTER — Encounter (INDEPENDENT_AMBULATORY_CARE_PROVIDER_SITE_OTHER): Admitting: Ophthalmology

## 2024-05-25 NOTE — Progress Notes (Signed)
 [facility] - Clinic Note  05/26/2024   CHIEF COMPLAINT Patient presents for Retina Follow Up  HISTORY OF PRESENT ILLNESS: Carol [name] is a 88 y.o. female who presents to the clinic today for:  HPI     Retina Follow Up   In both eyes.  This started 12 months ago.  Duration of 12 months.  Since onset it is stable.  I, the attending physician,  performed the HPI with the patient and updated documentation appropriately.        Comments   12 month retina follow up myopic deg OU pt is reporting vision little blurred at times she has some flashes at times denies any floaters       Last edited by Valdemar Rogue, MD on 05/27/2024  3:11 PM.    Pt states she is still seeing images upside down when she wakes up from a nap, she states it usually happens during the day, so she tries not to nap, she saw a neuroplogist in April and is supposed to be having an MRI, but it is not scheduled yet    Referring physician: Abigail Maude POUR [address] [place],  [place] [address]  HISTORICAL INFORMATION:  Selected notes from the MEDICAL RECORD NUMBER Referred by Dr. 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   No current facility-administered medications for this visit. (Ophthalmic Drugs)   Current Outpatient Medications (Other)  Medication Sig   acetaminophen  (TYLENOL  8 HOUR ARTHRITIS PAIN) 650 MG CR tablet Take 650-1,300 mg by mouth every 8 (eight) hours as needed for pain.   acetaminophen  (TYLENOL ) 650 MG CR tablet Take 1,300 mg by mouth daily as needed for pain.    aspirin  81 MG chewable tablet Chew 1 tablet (81 mg total) by mouth daily with lunch.   chlorpheniramine (CHLOR-TRIMETON) 4 MG tablet Take 4 mg by mouth daily as needed for allergies.    empagliflozin  (JARDIANCE ) 10 MG TABS tablet Take 1 tablet (10 mg total) by mouth daily.   famotidine  (PEPCID ) 10 MG tablet Take 20 mg by mouth daily as needed for heartburn or indigestion.   Fluticasone -Umeclidin-Vilant (TRELEGY ELLIPTA ) 100-62.5-25 MCG/ACT AEPB Inhale 1 puff into the lungs daily.   ipratropium (ATROVENT ) 0.03 % nasal spray Place 2 sprays into both nostrils 3 (three) times daily.   ivabradine  (CORLANOR ) 5 MG TABS tablet TAKE 1 TABLET TWICE A DAY WITH MEALS   loratadine (CLARITIN) 10 MG tablet Take 10 mg by mouth daily.   LORazepam  (ATIVAN ) 1 MG tablet Take 1 tablet (1 mg total) by mouth daily at 8 pm.   losartan  (COZAAR ) 25 MG tablet Take 1 tablet (25 mg total) by mouth every evening.   metoprolol  succinate (TOPROL -XL) 50 MG 24 hr tablet Take 1 tablet (50 mg total) by mouth in the morning.   mirtazapine  (REMERON ) 30 MG tablet Take 30 mg by mouth at bedtime.   Multiple Vitamins-Minerals (MULTI VITAMIN/MINERALS) TABS Take 1 tablet by mouth daily.   pantoprazole  (PROTONIX ) 40 MG tablet Take 40 mg by mouth 2 (two) times daily.   polyethylene glycol (MIRALAX  / GLYCOLAX ) 17 g packet Take 17 g by mouth daily. (Patient taking differently: Take 17 g by mouth daily as needed for moderate constipation.)   raloxifene  (EVISTA ) 60  MG tablet Take 60 mg by mouth daily.   simvastatin  (ZOCOR ) 20 MG tablet Take 1 tablet (20 mg total) by mouth at bedtime.   spironolactone  (ALDACTONE ) 25 MG tablet Take 0.5 tablets (12.5 mg total) by mouth daily.   triamcinolone (NASACORT ALLERGY 24HR) 55 MCG/ACT AERO nasal inhaler Place 2 sprays into the nose daily.   triamcinolone ointment (KENALOG) 0.5 % Apply 1 Application topically 2 (two) times daily.   venlafaxine  XR (EFFEXOR -XR) 150 MG 24 hr capsule Take 150 mg by mouth daily with breakfast.   No current facility-administered medications for this visit. (Other)   REVIEW OF SYSTEMS: ROS   Positive for: Gastrointestinal, Cardiovascular, Eyes,  Allergic/Imm Last edited by Resa Delon ORN, COT on 05/26/2024  1:29 PM.      ALLERGIES Allergies  Allergen Reactions   Ciprofloxacin  Nausea Only and Other (See Comments)    Nausea and legs weak   Nitrofuran Derivatives Nausea Only and Other (See Comments)    nitrofuratonin bad nausea, legs weak   Paxil [Paroxetine] Other (See Comments)    Sick on stomach   Penicillins Other (See Comments)    Sick on stomach   Sulfa Antibiotics Other (See Comments)    jittery   PAST MEDICAL HISTORY Past Medical History:  Diagnosis Date   Anxiety    Arthritis    arthritis -hips   Colitis    Complication of anesthesia    B/P dropped post colonoscopy x1   GERD (gastroesophageal reflux disease)    Headache(784.0)    migraines rare occ.   Hypertension    Urinary tract infection early aug 2014   Past Surgical History:  Procedure Laterality Date   BIOPSY  05/21/2023   Procedure: BIOPSY;  Surgeon: Dianna Specking, MD;  Location: [facility];  Service: Gastroenterology;;   CATARACT EXTRACTION, BILATERAL Bilateral    COLONOSCOPY WITH PROPOFOL  N/A 08/03/2013   Procedure: COLONOSCOPY WITH PROPOFOL ;  Surgeon: Lynwood LITTIE Celestia Mickey., MD;  Location: [facility];  Service: Endoscopy;  Laterality: N/A;  ultra thin colon scope   COLONOSCOPY WITH PROPOFOL  N/A 02/04/2018   Procedure: COLONOSCOPY WITH PROPOFOL ;  Surgeon: Celestia Lynwood, MD;  Location: [facility];  Service: Endoscopy;  Laterality: N/A;   ESOPHAGOGASTRODUODENOSCOPY (EGD) WITH PROPOFOL  N/A 05/21/2023   Procedure: ESOPHAGOGASTRODUODENOSCOPY (EGD) WITH PROPOFOL ;  Surgeon: Dianna Specking, MD;  Location: [facility];  Service: Gastroenterology;  Laterality: N/A;   LASIK     FAMILY HISTORY History reviewed. No pertinent family history. SOCIAL HISTORY Social History   Tobacco Use   Smoking status: Former    Current packs/day: 0.00    Average packs/day: 0.3 packs/day for 40.0 years (10.0 ttl pk-yrs)    Types: Cigarettes    Start  date: 09/05/1982    Quit date: 09/05/2022    Years since quitting: 1.7   Smokeless tobacco: Never  Vaping Use   Vaping status: Never Used  Substance Use Topics   Alcohol use: No   Drug use: No       OPHTHALMIC EXAM:  Base Eye Exam     Visual Acuity (Snellen - Linear)       Right Left   Dist cc 20/25 20/30 -2   Dist ph cc NI NI         Tonometry (Tonopen, 1:35 PM)       Right Left   Pressure 9 9         Pupils       Pupils Dark Light Shape React APD   Right PERRL  3 2 Round Brisk None   Left PERRL 4 4 Round Minimal Trace         Visual Fields       Left Right    Full Full         Neuro/Psych     Oriented x3: Yes   Mood/Affect: Normal         Dilation     Both eyes: 2.5% Phenylephrine  @ 1:36 PM           Slit Lamp and Fundus Exam     Slit Lamp Exam       Right Left   Lids/Lashes Dermatochalasis - upper lid Dermatochalasis - upper lid   Conjunctiva/Sclera White and quiet White and quiet   Cornea mild arcus, tear film debris, trace PEE, well healed cataract wound mild arcus, tear film debris, 1+PEE   Anterior Chamber deep and clear deep and clear   Iris Round and dilated Round and dilated   Lens Toric PC IOL in good position with marks at 0300 and 0900 Toric PC IOL in good position with marks at 0300 and 0900   Anterior Vitreous Vitreous syneresis, Posterior vitreous detachment Vitreous syneresis, Posterior vitreous detachment         Fundus Exam       Right Left   Disc mild Pallor, Sharp rim, PPA 1-2+pallor, Sharp rim, PPA   C/D Ratio 0.3 0.4   Macula Flat, Blunted foveal reflex, RPE mottling, clumping and atrophy, no heme, CNV or edema Flat, Blunted foveal reflex, RPE mottling, clumping and atrophy, no heme, CNV or edema   Vessels attenuated, Tortuous attenuated, Tortuous   Periphery Attached, No heme Attached, No heme           Refraction     Wearing Rx       Sphere Cylinder Axis Add   Right -1.25 +0.75 174 +2.50    Left -0.75 +1.50 168            IMAGING AND PROCEDURES  Imaging and Procedures for 05/26/2024  OCT, Retina - OU - Both Eyes       Right Eye Quality was good. Central Foveal Thickness: 263. Progression has been stable. Findings include no IRF, no SRF, abnormal foveal contour, myopic contour, retinal drusen , outer retinal atrophy (Severely myopic contour with patchy ellipsoid signal / ORA).   Left Eye Quality was good. Central Foveal Thickness: 287. Progression has been stable. Findings include normal foveal contour, no IRF, no SRF, retinal drusen (Severely myopic contour with patchy ellipsoid signal / ORA).   Notes *Images captured and stored on drive  Diagnosis / Impression:  Severe myopic contour OU patchy ellipsoid signal / ORA, no fluid OU Non ex ARMD OU -- mild drusen  Clinical management:  See below  Abbreviations: NFP - Normal foveal profile. CME - cystoid macular edema. PED - pigment epithelial detachment. IRF - intraretinal fluid. SRF - subretinal fluid. EZ - ellipsoid zone. ERM - epiretinal membrane. ORA - outer retinal atrophy. ORT - outer retinal tubulation. SRHM - subretinal hyper-reflective material. IRHM - intraretinal hyper-reflective material           ASSESSMENT/PLAN:   ICD-10-CM   1. Visual disturbances  H53.9     2. Uncomplicated degenerative myopia of both eyes  H44.23     3. Intermediate stage nonexudative age-related macular degeneration of both eyes  H35.3132 OCT, Retina - OU - Both Eyes    4. Essential hypertension  I10  5. Hypertensive retinopathy of both eyes  H35.033     6. Pseudophakia, both eyes  Z96.1     7. Dry eyes  H04.123      1. Visual disturbances  - pt reports several month history of episodes with her vision being flipped upside down upon waking  - initially occurred intermittently after naps on the couch but has since progressed to occur after waking in the bedroom  - episodes correct themselves after several minutes  and with opening/closing and rubbing eyes.  - unclear etiology  - dilated exam with no significant pathologies other than dry cornea OU (OS > OD) and mild myopic degeneration and nonexudative age-related macular degeneration  - discussed findings, prognosis - no retinal or ophthalmic interventions indicated or recommended at this time besides artifical tears / lubricant drops for corneal dryness - monitor  2. Myopic degeneration OU  - severely myopic contour w/ ellipsoid thinning/loss  - no myopic CNV or fluid  - monitor  - f/u 1 yr DFE, OCT  3. Age related macular degeneration, non-exudative, OU  - The incidence, anatomy, and pathology of dry AMD, risk of progression, and the AREDS and AREDS 2 studies including smoking risks discussed with patient.  - Recommend amsler grid monitoring  - f/u 1 yr -- DFE/OCT  4,5. Hypertensive retinopathy OU - discussed importance of tight BP control - monitor  6. Pseudophakia OU  - s/p CE/toric IOL OU  (Dr. Camillo)  - IOLs in good position, doing well  - monitor  7. Dry eyes OU - recommend artificial tears and lubricating ointment as needed   Ophthalmic Meds Ordered this visit:  No orders of the defined types were placed in this encounter.    Return in about 1 year (around 05/26/2025) for f/u myopic degeneration OU, DFE, OCT.  There are no Patient Instructions on file for this visit.  Explained the diagnoses, plan, and follow up with the patient and they expressed understanding.  Patient expressed understanding of the importance of proper follow up care.   This document serves as a record of services personally performed by Redell JUDITHANN Hans, MD, PhD. It was created on their behalf by Auston Muzzy, COMT. The creation of this record is the provider's dictation and/or activities during the visit.  Electronically signed by: Auston Muzzy, COMT 05/27/24 3:12 PM  This document serves as a record of services personally performed by Redell JUDITHANN Hans,  MD, PhD. It was created on their behalf by Alan PARAS. Delores, OA an ophthalmic technician. The creation of this record is the provider's dictation and/or activities during the visit.    Electronically signed by: Alan PARAS. Delores, OA 05/27/24 3:12 PM  Redell JUDITHANN Hans, M.D., Ph.D. Diseases & Surgery of the Retina and Vitreous [facility]  I have reviewed the above documentation for accuracy and completeness, and I agree with the above. Redell JUDITHANN Hans, M.D., Ph.D. 05/27/24 3:13 PM   Abbreviations: M myopia (nearsighted); A astigmatism; H hyperopia (farsighted); P presbyopia; Mrx spectacle prescription;  CTL contact lenses; OD right eye; OS left eye; OU both eyes  XT exotropia; ET esotropia; PEK punctate epithelial keratitis; PEE punctate epithelial erosions; DES dry eye syndrome; MGD meibomian gland dysfunction; ATs artificial tears; PFAT's preservative free artificial tears; NSC nuclear sclerotic cataract; PSC posterior subcapsular cataract; ERM epi-retinal membrane; PVD posterior vitreous detachment; RD retinal detachment; DM diabetes mellitus; DR diabetic retinopathy; NPDR non-proliferative diabetic retinopathy; PDR proliferative diabetic retinopathy; CSME clinically significant macular edema;  DME diabetic macular edema; dbh dot blot hemorrhages; CWS cotton wool spot; POAG primary open angle glaucoma; C/D cup-to-disc ratio; HVF humphrey visual field; GVF goldmann visual field; OCT optical coherence tomography; IOP intraocular pressure; BRVO Branch retinal vein occlusion; CRVO central retinal vein occlusion; CRAO central retinal artery occlusion; BRAO branch retinal artery occlusion; RT retinal tear; SB scleral buckle; PPV pars plana vitrectomy; VH Vitreous hemorrhage; PRP panretinal laser photocoagulation; IVK intravitreal kenalog; VMT vitreomacular traction; MH Macular hole;  NVD neovascularization of the disc; NVE neovascularization elsewhere; AREDS age related eye disease study;  ARMD age related macular degeneration; POAG primary open angle glaucoma; EBMD epithelial/anterior basement membrane dystrophy; ACIOL anterior chamber intraocular lens; IOL intraocular lens; PCIOL posterior chamber intraocular lens; Phaco/IOL phacoemulsification with intraocular lens placement; PRK photorefractive keratectomy; LASIK laser assisted in situ keratomileusis; HTN hypertension; DM diabetes mellitus; COPD chronic obstructive pulmonary disease

## 2024-05-26 ENCOUNTER — Encounter (INDEPENDENT_AMBULATORY_CARE_PROVIDER_SITE_OTHER): Payer: Self-pay | Admitting: Ophthalmology

## 2024-05-26 ENCOUNTER — Ambulatory Visit (INDEPENDENT_AMBULATORY_CARE_PROVIDER_SITE_OTHER): Admitting: Ophthalmology

## 2024-05-26 DIAGNOSIS — H4423 Degenerative myopia, bilateral: Secondary | ICD-10-CM

## 2024-05-26 DIAGNOSIS — H539 Unspecified visual disturbance: Secondary | ICD-10-CM | POA: Diagnosis not present

## 2024-05-26 DIAGNOSIS — H353132 Nonexudative age-related macular degeneration, bilateral, intermediate dry stage: Secondary | ICD-10-CM | POA: Diagnosis not present

## 2024-05-26 DIAGNOSIS — I10 Essential (primary) hypertension: Secondary | ICD-10-CM | POA: Diagnosis not present

## 2024-05-26 DIAGNOSIS — Z961 Presence of intraocular lens: Secondary | ICD-10-CM

## 2024-05-26 DIAGNOSIS — H35033 Hypertensive retinopathy, bilateral: Secondary | ICD-10-CM

## 2024-05-26 DIAGNOSIS — H04123 Dry eye syndrome of bilateral lacrimal glands: Secondary | ICD-10-CM | POA: Diagnosis not present

## 2024-05-27 ENCOUNTER — Encounter (INDEPENDENT_AMBULATORY_CARE_PROVIDER_SITE_OTHER): Payer: Self-pay | Admitting: Ophthalmology

## 2024-06-29 DIAGNOSIS — F411 Generalized anxiety disorder: Secondary | ICD-10-CM | POA: Diagnosis not present

## 2024-06-29 DIAGNOSIS — F331 Major depressive disorder, recurrent, moderate: Secondary | ICD-10-CM | POA: Diagnosis not present

## 2024-07-02 ENCOUNTER — Ambulatory Visit: Payer: Self-pay | Admitting: Cardiology

## 2024-07-02 ENCOUNTER — Ambulatory Visit: Payer: Medicare Other | Admitting: Nurse Practitioner

## 2024-07-03 ENCOUNTER — Encounter: Payer: Self-pay | Admitting: Nurse Practitioner

## 2024-07-03 ENCOUNTER — Ambulatory Visit: Admitting: Nurse Practitioner

## 2024-07-03 ENCOUNTER — Ambulatory Visit: Admitting: Cardiology

## 2024-07-03 ENCOUNTER — Telehealth: Admitting: Nurse Practitioner

## 2024-07-03 DIAGNOSIS — J9611 Chronic respiratory failure with hypoxia: Secondary | ICD-10-CM | POA: Diagnosis not present

## 2024-07-03 DIAGNOSIS — J432 Centrilobular emphysema: Secondary | ICD-10-CM | POA: Diagnosis not present

## 2024-07-03 DIAGNOSIS — J300 Vasomotor rhinitis: Secondary | ICD-10-CM

## 2024-07-03 NOTE — Assessment & Plan Note (Signed)
 Stable on current regimen

## 2024-07-03 NOTE — Progress Notes (Signed)
 Patient ID: Carol [name], female     DOB: October 22, 1936, 88 y.o.      MRN: [id number]  No chief complaint on file.   Virtual Visit via Video Note  I connected with Carol [name] on 07/03/24 at  2:00 PM EDT by a video enabled telemedicine application and verified that I am speaking with the correct person using two identifiers.  Location: Patient: Home Provider: Office   I discussed the limitations of evaluation and management by telemedicine and the availability of in person appointments. The patient expressed understanding and agreed to proceed.  History of Present Illness: 88 year old female, former smoker followed for COPD and chronic respiratory failure. She is a patient of Dr. Cyndi and last seen in office 12/31/2023. Past medical history significant for HTN, cardiomyopathy, HFrEF, GERD, anxiety, HLD, severe protein-calorie malnutrition, depression.   TEST/EVENTS:  07/18/2022 CT chest wo con: small pericardial effusion, slightly increased. Extensive CAD/atherosclerosis. No LAD. Small right pleural effusion previously seen has resolved. Extensive emphysematous changes. Some biapical scarring. Possible subpleural pulmonary nodules resolved.  12/27/2022 echo: EF 40-45%. GIDD. Mild TR. No evidence of PH.    12/31/2023: OV with Dr. Jude.  On O2 since 02/2022 following COPD exacerbation.  No significant changes in her breathing since last visit.  Does not feel the need for oxygen  during the day.  Has been using POC at night instead of the larger device.  On Trelegy and feels like this is working for her.  Only had 10-minute desaturation on Ono.  Okay to return larger oxygen  unit and keep POC.  07/03/2024: Today-follow-up Discussed the use of AI scribe software for clinical note transcription with the patient, who gave verbal consent to proceed.  History of Present Illness Carol [name] is an 88 year old female with COPD who presents for a follow-up visit.  She was taken off oxygen   therapy in February and attempted to return the large home concentrator and keep the POC but was unable to do so due to company policy. She is currently incurring a cost of approximately $200 per month for the equipment, which includes a large tank and a smaller portable unit.  She continues to use Trelegy for COPD management and does not require her albuterol  inhaler. She also uses nasal sprays regularly.  In April, she resumed smoking following the death of her brother, consuming about ten cigarettes a day. She smokes due to stress.  No recent episodes of coughing, hemoptysis, or weight changes. Her weight has remained stable.  Breathing is at her baseline.  Able to do things around the house.    Allergies  Allergen Reactions   Ciprofloxacin  Nausea Only and Other (See Comments)    Nausea and legs weak   Nitrofuran Derivatives Nausea Only and Other (See Comments)    nitrofuratonin bad nausea, legs weak   Paxil [Paroxetine] Other (See Comments)    Sick on stomach   Penicillins Other (See Comments)    Sick on stomach   Sulfa Antibiotics Other (See Comments)    jittery    There is no immunization history on file for this patient. Past Medical History:  Diagnosis Date   Anxiety    Arthritis    arthritis -hips   Colitis    Complication of anesthesia    B/P dropped post colonoscopy x1   GERD (gastroesophageal reflux disease)    Headache(784.0)    migraines rare occ.   Hypertension    Urinary tract infection early aug  2014    Tobacco History: Social History   Tobacco Use  Smoking Status Former   Current packs/day: 10.00   Average packs/day: 0.3 packs/day for 40.4 years (13.5 ttl pk-yrs)   Types: Cigarettes   Start date: 09/05/1982   Quit date: 09/05/2022  Smokeless Tobacco Never   Counseling given: Not Answered   Outpatient Medications Prior to Visit  Medication Sig Dispense Refill   acetaminophen  (TYLENOL  8 HOUR ARTHRITIS PAIN) 650 MG CR tablet Take 650-1,300 mg by  mouth every 8 (eight) hours as needed for pain.     acetaminophen  (TYLENOL ) 650 MG CR tablet Take 1,300 mg by mouth daily as needed for pain.      aspirin  81 MG chewable tablet Chew 1 tablet (81 mg total) by mouth daily with lunch. 90 tablet 3   chlorpheniramine (CHLOR-TRIMETON) 4 MG tablet Take 4 mg by mouth daily as needed for allergies.     empagliflozin  (JARDIANCE ) 10 MG TABS tablet Take 1 tablet (10 mg total) by mouth daily. 90 tablet 1   famotidine  (PEPCID ) 10 MG tablet Take 20 mg by mouth daily as needed for heartburn or indigestion.     Fluticasone -Umeclidin-Vilant (TRELEGY ELLIPTA ) 100-62.5-25 MCG/ACT AEPB Inhale 1 puff into the lungs daily. 180 each 3   ipratropium (ATROVENT ) 0.03 % nasal spray Place 2 sprays into both nostrils 3 (three) times daily. 30 mL 5   ivabradine  (CORLANOR ) 5 MG TABS tablet TAKE 1 TABLET TWICE A DAY WITH MEALS 180 tablet 1   loratadine (CLARITIN) 10 MG tablet Take 10 mg by mouth daily.     LORazepam  (ATIVAN ) 1 MG tablet Take 1 tablet (1 mg total) by mouth daily at 8 pm. 10 tablet 0   losartan  (COZAAR ) 25 MG tablet Take 1 tablet (25 mg total) by mouth every evening. 90 tablet 3   metoprolol  succinate (TOPROL -XL) 50 MG 24 hr tablet Take 1 tablet (50 mg total) by mouth in the morning. 90 tablet 3   mirtazapine  (REMERON ) 30 MG tablet Take 30 mg by mouth at bedtime.     Multiple Vitamins-Minerals (MULTI VITAMIN/MINERALS) TABS Take 1 tablet by mouth daily.     pantoprazole  (PROTONIX ) 40 MG tablet Take 40 mg by mouth 2 (two) times daily.     Polyethyl Glycol-Propyl Glycol (SYSTANE OP) Place 1 drop into both eyes 3 (three) times daily.     polyethylene glycol (MIRALAX  / GLYCOLAX ) 17 g packet Take 17 g by mouth daily. (Patient taking differently: Take 17 g by mouth daily as needed for moderate constipation.) 14 each 0   raloxifene  (EVISTA ) 60 MG tablet Take 60 mg by mouth daily.     simvastatin  (ZOCOR ) 20 MG tablet Take 1 tablet (20 mg total) by mouth at bedtime. 90  tablet 3   spironolactone  (ALDACTONE ) 25 MG tablet Take 0.5 tablets (12.5 mg total) by mouth daily. 45 tablet 3   triamcinolone (NASACORT ALLERGY 24HR) 55 MCG/ACT AERO nasal inhaler Place 2 sprays into the nose daily.     triamcinolone ointment (KENALOG) 0.5 % Apply 1 Application topically 2 (two) times daily.     venlafaxine  XR (EFFEXOR -XR) 150 MG 24 hr capsule Take 150 mg by mouth daily with breakfast.     No facility-administered medications prior to visit.     Review of Systems:   Constitutional: No weight loss or gain, night sweats, fevers, chills, +fatigue (baseline) HEENT: No headaches, difficulty swallowing, tooth/dental problems, or sore throat. No sneezing, itching, ear ache +nasal congestion (baseline) CV:  No chest pain, orthopnea, PND, swelling in lower extremities, anasarca, dizziness, palpitations, syncope Resp: + shortness of breath with exertion (baseline). No excess mucus or change in color of mucus. No productive or non-productive. No hemoptysis. No wheezing.  No chest wall deformity GI:  No loss of appetite  Neuro: No dizziness or lightheadedness.  Psych: No depression or anxiety. Mood stable.   Observations/Objective: Patient is well-developed, well-nourished in no acute distress.  Resting comfortably at home.  No labored breathing.  Speech is clear and coherent with logical content.  Patient is alert and oriented at baseline.   Assessment and Plan: Centrilobular emphysema (HCC) Compensated on current regimen. No recent exacerbations or hospitalizations. Encouraged to remain active. Action plan in place. Smoking cessation strongly advised  Patient Instructions  Continue Trelegy 1 puff daily. Brush tongue and rinse mouth afterwards Continue Albuterol  inhaler 2 puffs or 3 mL neb every 6 hours as needed for shortness of breath or wheezing. Notify if symptoms persist despite rescue inhaler/neb use.  Continue protonix  1 tab daily Continue claritin 1 tab  daily Continue singulair At bedtime Continue Nasacort 2 sprays each nostril daily  Continue chlorpheniramine 4 mg tablet over the counter At bedtime as needed for sinus drainage Continue ipratropium (Atrovent ) nasal spray 2 sprays each nostril Three times a day for nasal congestion/drainage. Use before meals    Follow up in 6 months with Dr. Jude. If symptoms worsen, please contact office for sooner follow up or seek emergency care   Chronic respiratory failure with hypoxia (HCC) Not using supplemental O2. No desaturations on room air during prior walk test. ONO with minimal O2 desaturations (10 min </88%). Advised by Dr. Alva to keep POC and return large concentrator. [employer] would not allow her to do so and she is paying $200/month. Advised her to look into purchasing POC out of pocket and returning equipment to [employer]. Goal >88-90%  Vasomotor rhinitis Stable on current regimen   I discussed the assessment and treatment plan with the patient. The patient was provided an opportunity to ask questions and all were answered. The patient agreed with the plan and demonstrated an understanding of the instructions.   The patient was advised to call back or seek an in-person evaluation if the symptoms worsen or if the condition fails to improve as anticipated.  I provided 25 minutes of non-face-to-face time during this encounter.   Comer LULLA Rouleau, NP

## 2024-07-03 NOTE — Patient Instructions (Addendum)
 Continue Trelegy 1 puff daily. Brush tongue and rinse mouth afterwards Continue Albuterol  inhaler 2 puffs or 3 mL neb every 6 hours as needed for shortness of breath or wheezing. Notify if symptoms persist despite rescue inhaler/neb use.  Continue protonix  1 tab daily Continue claritin 1 tab daily Continue singulair At bedtime Continue Nasacort 2 sprays each nostril daily  Continue chlorpheniramine 4 mg tablet over the counter At bedtime as needed for sinus drainage Continue ipratropium (Atrovent ) nasal spray 2 sprays each nostril Three times a day for nasal congestion/drainage. Use before meals    Follow up in 6 months with Dr. Jude. If symptoms worsen, please contact office for sooner follow up or seek emergency care

## 2024-07-03 NOTE — Assessment & Plan Note (Addendum)
 Compensated on current regimen. No recent exacerbations or hospitalizations. Encouraged to remain active. Action plan in place. Smoking cessation strongly advised  Patient Instructions  Continue Trelegy 1 puff daily. Brush tongue and rinse mouth afterwards Continue Albuterol  inhaler 2 puffs or 3 mL neb every 6 hours as needed for shortness of breath or wheezing. Notify if symptoms persist despite rescue inhaler/neb use.  Continue protonix  1 tab daily Continue claritin 1 tab daily Continue singulair At bedtime Continue Nasacort 2 sprays each nostril daily  Continue chlorpheniramine 4 mg tablet over the counter At bedtime as needed for sinus drainage Continue ipratropium (Atrovent ) nasal spray 2 sprays each nostril Three times a day for nasal congestion/drainage. Use before meals    Follow up in 6 months with Dr. Jude. If symptoms worsen, please contact office for sooner follow up or seek emergency care

## 2024-07-03 NOTE — Assessment & Plan Note (Signed)
 Not using supplemental O2. No desaturations on room air during prior walk test. ONO with minimal O2 desaturations (10 min </88%). Advised by Dr. Alva to keep POC and return large concentrator. [employer] would not allow her to do so and she is paying $200/month. Advised her to look into purchasing POC out of pocket and returning equipment to [employer]. Goal >88-90%

## 2024-07-08 ENCOUNTER — Encounter: Payer: Self-pay | Admitting: Cardiology

## 2024-07-08 ENCOUNTER — Ambulatory Visit: Attending: Cardiology | Admitting: Cardiology

## 2024-07-08 VITALS — BP 128/60 | HR 74 | Ht 60.0 in | Wt 101.4 lb

## 2024-07-08 DIAGNOSIS — I2510 Atherosclerotic heart disease of native coronary artery without angina pectoris: Secondary | ICD-10-CM

## 2024-07-08 DIAGNOSIS — I5020 Unspecified systolic (congestive) heart failure: Secondary | ICD-10-CM | POA: Diagnosis not present

## 2024-07-08 DIAGNOSIS — Z79899 Other long term (current) drug therapy: Secondary | ICD-10-CM | POA: Diagnosis not present

## 2024-07-08 DIAGNOSIS — I10 Essential (primary) hypertension: Secondary | ICD-10-CM | POA: Diagnosis not present

## 2024-07-08 NOTE — Progress Notes (Addendum)
 Cardiology Office Note:  .   Date:  07/08/2024  ID:  Carol [name]  A Carol, [name] November 11, 1936, MRN [id number] PCP: Carol Elsie SAUNDERS, MD  [facility] Providers Cardiologist:  Carol Lawrence, MD PCP: Carol Elsie SAUNDERS, MD  Chief Complaint  Patient presents with   Hypertension   Hyperlipidemia   HFrEF     Carol  A [name] is a 88 y.o. female with hypertension, hyperlipidemia, extensive coronary atherosclerosis, HFrEF, former smoker with COPD, extensive emphysematous changes, depression.    History of Present Illness  Patient is here today with her daughter.  She denies any complaints of chest pain, significant shortness of breath.  She has unchanged bruise discoloration of her toes without any symptoms of claudication or open wounds or injuries on her feet.  She has quit smoking, but unfortunately started smoking back again after her brother passed in March 2025.  She is trying to quit, but is not committed yet.     Vitals:   07/08/24 1507  BP: 128/60  Pulse: 74      Review of Systems  Cardiovascular:  Negative for chest pain, dyspnea on exertion, leg swelling, palpitations and syncope.        Studies Reviewed: SABRA        EKG 07/08/2024: Normal sinus rhythm Minimal voltage criteria for LVH, may be normal variant ( Cornell product ) Possible Anterior infarct , age undetermined When compared with ECG of 10-Sep-2022 12:21, ST no longer elevated in Anterior leads Confirmed by [name] Carol [phone]) on 07/08/2024 3:15:53 PM    Labs 07/2023: Chol 148, TG 164, HDL 45, LDL 75 Hb 13.6  08/2022: Cr 0.6, K 5.2    Physical Exam Vitals and nursing note reviewed.  Constitutional:      General: She is not in acute distress. Neck:     Vascular: No JVD.  Cardiovascular:     Rate and Rhythm: Normal rate and regular rhythm.     Heart sounds: Normal heart sounds. No murmur heard. Pulmonary:     Effort: Pulmonary effort is normal.     Breath sounds: Normal  breath sounds. No wheezing or rales.  Musculoskeletal:     Right lower leg: No edema.     Left lower leg: No edema.  Skin:    Comments: There is discoloration of the toes without any ulcers      VISIT DIAGNOSES:   ICD-10-CM   1. Coronary artery disease involving native coronary artery of native heart without angina pectoris  I25.10 EKG 12-Lead    Lipid panel    2. HFrEF (heart failure with reduced ejection fraction) (HCC)  I50.20 EKG 12-Lead    Basic Metabolic Panel (BMET)    3. Essential hypertension  I10 EKG 12-Lead    Basic Metabolic Panel (BMET)    4. Medication management  Z79.899 Basic Metabolic Panel (BMET)    Lipid panel       Carol  A [name] is a 88 y.o. female with hypertension, hyperlipidemia, extensive coronary atherosclerosis, HFrEF, former smoker with COPD, extensive emphysematous changes, depression.   Assessment & Plan  HFrEF: Could be multifactorial.  EF improved to 45%.  Clinically euvolemic.   She has opted not to pursue invasive workup.  I think this is reasonable approach given advanced age and clinical stability. Continue losartan , spironolactone , Jardiance , metoprolol , ivabradine .   Check BMP.  CAD: Extensive atherosclerosis noted on CT scan.  No anginal symptoms. Continue aspirin  and simvastatin , with which lipids are very well-controlled. Check lipid panel.  Bluish discoloration of toes: Intact pedal pulses, suspect small vessel disease given history of smoking.  No ulcers, no pain.  Continue current medical therapy.  Unfortunately, she is not ready to quit smoking at this time.     F/u in 1 year  Signed, Carol JINNY Lawrence, MD

## 2024-07-08 NOTE — Patient Instructions (Signed)
 Medication Instructions:  Your physician recommends that you continue on your current medications as directed. Please refer to the Current Medication list given to you today.  *If you need a refill on your cardiac medications before your next appointment, please call your pharmacy*  Lab Work: Please complete a BMET and a lipid panel today at our lab on the first floor before you leave.  If you have labs (blood work) drawn today and your tests are completely normal, you will receive your results only by: MyChart Message (if you have MyChart) OR A paper copy in the mail If you have any lab test that is abnormal or we need to change your treatment, we will call you to review the results.  Testing/Procedures: None.  Follow-Up: At [facility], you and your health needs are our priority.  As part of our continuing mission to provide you with exceptional heart care, our providers are all part of one team.  This team includes your primary Cardiologist (physician) and Advanced Practice Providers or APPs (Physician Assistants and Nurse Practitioners) who all work together to provide you with the care you need, when you need it.  Your next appointment:   1 year(s)  Provider:   Newman Lawrence, MD

## 2024-07-09 ENCOUNTER — Ambulatory Visit: Payer: Self-pay | Admitting: *Deleted

## 2024-07-09 DIAGNOSIS — E875 Hyperkalemia: Secondary | ICD-10-CM

## 2024-07-09 LAB — BASIC METABOLIC PANEL WITH GFR
BUN/Creatinine Ratio: 19 (ref 12–28)
BUN: 16 mg/dL (ref 8–27)
CO2: 24 mmol/L (ref 20–29)
Calcium: 9.6 mg/dL (ref 8.7–10.3)
Chloride: 101 mmol/L (ref 96–106)
Creatinine, Ser: 0.84 mg/dL (ref 0.57–1.00)
Glucose: 83 mg/dL (ref 70–99)
Potassium: 5.3 mmol/L — ABNORMAL HIGH (ref 3.5–5.2)
Sodium: 141 mmol/L (ref 134–144)
eGFR: 68 mL/min/1.73 (ref >59)

## 2024-07-09 LAB — LIPID PANEL
Chol/HDL Ratio: 3.2 ratio (ref 0.0–4.4)
Cholesterol, Total: 139 mg/dL (ref 100–199)
HDL: 44 mg/dL (ref >39)
LDL Chol Calc (NIH): 71 mg/dL (ref 0–99)
Triglycerides: 140 mg/dL (ref 0–149)
VLDL Cholesterol Cal: 24 mg/dL (ref 5–40)

## 2024-07-10 NOTE — Progress Notes (Signed)
 K slightly high. Avoid K rich foods like banana, avocado, potato, broccoli etc. Repeat BMP in 2 weeks. If remains high, may need to reduce spironolactone  dose or Jardiance .  Thanks MJP

## 2024-07-16 ENCOUNTER — Telehealth (HOSPITAL_BASED_OUTPATIENT_CLINIC_OR_DEPARTMENT_OTHER): Payer: Self-pay

## 2024-07-16 NOTE — Telephone Encounter (Signed)
 CMN received for oxygen  signed by provider and faxed confirmation received

## 2024-07-23 ENCOUNTER — Other Ambulatory Visit: Payer: Self-pay | Admitting: Cardiology

## 2024-07-24 DIAGNOSIS — E875 Hyperkalemia: Secondary | ICD-10-CM | POA: Diagnosis not present

## 2024-07-25 LAB — BASIC METABOLIC PANEL WITH GFR
BUN/Creatinine Ratio: 18 (ref 12–28)
BUN: 14 mg/dL (ref 8–27)
CO2: 25 mmol/L (ref 20–29)
Calcium: 9.6 mg/dL (ref 8.7–10.3)
Chloride: 103 mmol/L (ref 96–106)
Creatinine, Ser: 0.8 mg/dL (ref 0.57–1.00)
Glucose: 85 mg/dL (ref 70–99)
Potassium: 5.2 mmol/L (ref 3.5–5.2)
Sodium: 142 mmol/L (ref 134–144)
eGFR: 72 mL/min/1.73 (ref >59)

## 2024-08-05 ENCOUNTER — Other Ambulatory Visit: Payer: Self-pay | Admitting: Cardiology

## 2024-08-14 DIAGNOSIS — J449 Chronic obstructive pulmonary disease, unspecified: Secondary | ICD-10-CM | POA: Diagnosis not present

## 2024-08-14 DIAGNOSIS — Z23 Encounter for immunization: Secondary | ICD-10-CM | POA: Diagnosis not present

## 2024-08-14 DIAGNOSIS — F411 Generalized anxiety disorder: Secondary | ICD-10-CM | POA: Diagnosis not present

## 2024-08-14 DIAGNOSIS — Z0000 Encounter for general adult medical examination without abnormal findings: Secondary | ICD-10-CM | POA: Diagnosis not present

## 2024-08-14 DIAGNOSIS — I10 Essential (primary) hypertension: Secondary | ICD-10-CM | POA: Diagnosis not present

## 2024-08-14 DIAGNOSIS — I5022 Chronic systolic (congestive) heart failure: Secondary | ICD-10-CM | POA: Diagnosis not present

## 2024-08-14 DIAGNOSIS — K219 Gastro-esophageal reflux disease without esophagitis: Secondary | ICD-10-CM | POA: Diagnosis not present

## 2024-08-14 DIAGNOSIS — M19041 Primary osteoarthritis, right hand: Secondary | ICD-10-CM | POA: Diagnosis not present

## 2024-08-14 DIAGNOSIS — R238 Other skin changes: Secondary | ICD-10-CM | POA: Diagnosis not present

## 2024-08-14 DIAGNOSIS — M810 Age-related osteoporosis without current pathological fracture: Secondary | ICD-10-CM | POA: Diagnosis not present

## 2024-08-14 DIAGNOSIS — E7800 Pure hypercholesterolemia, unspecified: Secondary | ICD-10-CM | POA: Diagnosis not present

## 2024-08-14 DIAGNOSIS — F324 Major depressive disorder, single episode, in partial remission: Secondary | ICD-10-CM | POA: Diagnosis not present

## 2024-08-14 DIAGNOSIS — J301 Allergic rhinitis due to pollen: Secondary | ICD-10-CM | POA: Diagnosis not present

## 2024-08-26 ENCOUNTER — Telehealth (HOSPITAL_BASED_OUTPATIENT_CLINIC_OR_DEPARTMENT_OTHER): Payer: Self-pay

## 2024-08-26 NOTE — Telephone Encounter (Signed)
 CMN received for oxygen  from [facility] signed by provider and faxed confirmation received

## 2024-09-01 DIAGNOSIS — M1811 Unilateral primary osteoarthritis of first carpometacarpal joint, right hand: Secondary | ICD-10-CM | POA: Diagnosis not present

## 2024-09-07 DIAGNOSIS — H9313 Tinnitus, bilateral: Secondary | ICD-10-CM | POA: Diagnosis not present

## 2024-09-07 DIAGNOSIS — H903 Sensorineural hearing loss, bilateral: Secondary | ICD-10-CM | POA: Diagnosis not present

## 2024-09-09 ENCOUNTER — Other Ambulatory Visit: Payer: Self-pay | Admitting: Cardiology

## 2024-09-21 DIAGNOSIS — F331 Major depressive disorder, recurrent, moderate: Secondary | ICD-10-CM | POA: Diagnosis not present

## 2024-09-21 DIAGNOSIS — F411 Generalized anxiety disorder: Secondary | ICD-10-CM | POA: Diagnosis not present

## 2024-10-06 ENCOUNTER — Ambulatory Visit: Admitting: Adult Health

## 2024-10-15 DIAGNOSIS — M1811 Unilateral primary osteoarthritis of first carpometacarpal joint, right hand: Secondary | ICD-10-CM | POA: Diagnosis not present

## 2024-10-26 ENCOUNTER — Other Ambulatory Visit: Payer: Self-pay | Admitting: Cardiology

## 2024-10-29 ENCOUNTER — Telehealth: Payer: Self-pay | Admitting: Cardiology

## 2024-10-29 MED ORDER — SPIRONOLACTONE 25 MG PO TABS: 12.5000 mg | ORAL_TABLET | Freq: Every day | ORAL | 2 refills | Status: AC

## 2024-10-29 NOTE — Telephone Encounter (Signed)
*  STAT* If patient is at the pharmacy, call can be transferred to refill team.   1. Which medications need to be refilled? (please list name of each medication and dose if known) spironolactone  (ALDACTONE ) 25 MG tablet    2. Would you like to learn more about the convenience, safety, & potential cost savings by using the [facility]? NO   3. Are you open to using the [facility] NO   4. Which pharmacy/location (including street and city if local pharmacy) is medication to be sent to? [id number] - [place], [place] - [address] AT [facility] OF [address]    5. Do they need a 30 day or 90 day supply? 90   [facility] and patient needs it sent to [facility].

## 2024-10-29 NOTE — Telephone Encounter (Signed)
 Pt's medication was sent to pt's pharmacy as requested. Confirmation received.

## 2024-11-03 ENCOUNTER — Emergency Department (HOSPITAL_COMMUNITY)

## 2024-11-03 ENCOUNTER — Inpatient Hospital Stay (HOSPITAL_COMMUNITY)
Admission: EM | Admit: 2024-11-03 | Discharge: 2024-11-14 | DRG: 871 | Disposition: A | Discharge status: Home Health | Attending: Internal Medicine | Admitting: Internal Medicine

## 2024-11-03 ENCOUNTER — Other Ambulatory Visit: Payer: Self-pay

## 2024-11-03 ENCOUNTER — Encounter (HOSPITAL_COMMUNITY): Payer: Self-pay | Admitting: *Deleted

## 2024-11-03 DIAGNOSIS — J9621 Acute and chronic respiratory failure with hypoxia: Secondary | ICD-10-CM | POA: Diagnosis present

## 2024-11-03 DIAGNOSIS — D509 Iron deficiency anemia, unspecified: Secondary | ICD-10-CM | POA: Diagnosis present

## 2024-11-03 DIAGNOSIS — R6520 Severe sepsis without septic shock: Secondary | ICD-10-CM | POA: Diagnosis present

## 2024-11-03 DIAGNOSIS — B9789 Other viral agents as the cause of diseases classified elsewhere: Secondary | ICD-10-CM | POA: Diagnosis present

## 2024-11-03 DIAGNOSIS — N179 Acute kidney failure, unspecified: Principal | ICD-10-CM | POA: Diagnosis present

## 2024-11-03 DIAGNOSIS — R627 Adult failure to thrive: Secondary | ICD-10-CM | POA: Diagnosis present

## 2024-11-03 DIAGNOSIS — I5020 Unspecified systolic (congestive) heart failure: Secondary | ICD-10-CM | POA: Diagnosis present

## 2024-11-03 DIAGNOSIS — E782 Mixed hyperlipidemia: Secondary | ICD-10-CM | POA: Diagnosis present

## 2024-11-03 DIAGNOSIS — Z9981 Dependence on supplemental oxygen: Secondary | ICD-10-CM | POA: Diagnosis not present

## 2024-11-03 DIAGNOSIS — Z7951 Long term (current) use of inhaled steroids: Secondary | ICD-10-CM

## 2024-11-03 DIAGNOSIS — F39 Unspecified mood [affective] disorder: Secondary | ICD-10-CM | POA: Diagnosis present

## 2024-11-03 DIAGNOSIS — K920 Hematemesis: Secondary | ICD-10-CM

## 2024-11-03 DIAGNOSIS — J189 Pneumonia, unspecified organism: Secondary | ICD-10-CM | POA: Diagnosis not present

## 2024-11-03 DIAGNOSIS — Z87891 Personal history of nicotine dependence: Secondary | ICD-10-CM

## 2024-11-03 DIAGNOSIS — I110 Hypertensive heart disease with heart failure: Secondary | ICD-10-CM | POA: Diagnosis present

## 2024-11-03 DIAGNOSIS — E46 Unspecified protein-calorie malnutrition: Secondary | ICD-10-CM | POA: Diagnosis present

## 2024-11-03 DIAGNOSIS — J432 Centrilobular emphysema: Secondary | ICD-10-CM | POA: Diagnosis present

## 2024-11-03 DIAGNOSIS — R1110 Vomiting, unspecified: Secondary | ICD-10-CM | POA: Diagnosis present

## 2024-11-03 DIAGNOSIS — K5900 Constipation, unspecified: Secondary | ICD-10-CM | POA: Diagnosis present

## 2024-11-03 DIAGNOSIS — A419 Sepsis, unspecified organism: Principal | ICD-10-CM | POA: Diagnosis present

## 2024-11-03 DIAGNOSIS — I2510 Atherosclerotic heart disease of native coronary artery without angina pectoris: Secondary | ICD-10-CM | POA: Diagnosis present

## 2024-11-03 DIAGNOSIS — K219 Gastro-esophageal reflux disease without esophagitis: Secondary | ICD-10-CM | POA: Diagnosis not present

## 2024-11-03 DIAGNOSIS — F419 Anxiety disorder, unspecified: Secondary | ICD-10-CM | POA: Diagnosis present

## 2024-11-03 DIAGNOSIS — R0989 Other specified symptoms and signs involving the circulatory and respiratory systems: Secondary | ICD-10-CM

## 2024-11-03 DIAGNOSIS — Z66 Do not resuscitate: Secondary | ICD-10-CM | POA: Diagnosis present

## 2024-11-03 DIAGNOSIS — K224 Dyskinesia of esophagus: Secondary | ICD-10-CM | POA: Diagnosis present

## 2024-11-03 DIAGNOSIS — Z881 Allergy status to other antibiotic agents status: Secondary | ICD-10-CM

## 2024-11-03 DIAGNOSIS — J159 Unspecified bacterial pneumonia: Secondary | ICD-10-CM | POA: Diagnosis present

## 2024-11-03 DIAGNOSIS — J441 Chronic obstructive pulmonary disease with (acute) exacerbation: Secondary | ICD-10-CM | POA: Diagnosis present

## 2024-11-03 DIAGNOSIS — Z7982 Long term (current) use of aspirin: Secondary | ICD-10-CM

## 2024-11-03 DIAGNOSIS — Z1152 Encounter for screening for COVID-19: Secondary | ICD-10-CM | POA: Diagnosis not present

## 2024-11-03 DIAGNOSIS — Z681 Body mass index (BMI) 19 or less, adult: Secondary | ICD-10-CM

## 2024-11-03 DIAGNOSIS — R197 Diarrhea, unspecified: Secondary | ICD-10-CM | POA: Diagnosis not present

## 2024-11-03 DIAGNOSIS — I5042 Chronic combined systolic (congestive) and diastolic (congestive) heart failure: Secondary | ICD-10-CM | POA: Diagnosis present

## 2024-11-03 DIAGNOSIS — D7589 Other specified diseases of blood and blood-forming organs: Secondary | ICD-10-CM | POA: Diagnosis present

## 2024-11-03 DIAGNOSIS — E875 Hyperkalemia: Secondary | ICD-10-CM | POA: Diagnosis not present

## 2024-11-03 DIAGNOSIS — Z79899 Other long term (current) drug therapy: Secondary | ICD-10-CM

## 2024-11-03 DIAGNOSIS — I10 Essential (primary) hypertension: Secondary | ICD-10-CM | POA: Diagnosis present

## 2024-11-03 DIAGNOSIS — Z880 Allergy status to penicillin: Secondary | ICD-10-CM

## 2024-11-03 DIAGNOSIS — B348 Other viral infections of unspecified site: Secondary | ICD-10-CM | POA: Diagnosis not present

## 2024-11-03 DIAGNOSIS — J440 Chronic obstructive pulmonary disease with acute lower respiratory infection: Secondary | ICD-10-CM | POA: Diagnosis present

## 2024-11-03 DIAGNOSIS — Z7984 Long term (current) use of oral hypoglycemic drugs: Secondary | ICD-10-CM

## 2024-11-03 DIAGNOSIS — J9601 Acute respiratory failure with hypoxia: Secondary | ICD-10-CM

## 2024-11-03 DIAGNOSIS — E43 Unspecified severe protein-calorie malnutrition: Secondary | ICD-10-CM | POA: Diagnosis present

## 2024-11-03 DIAGNOSIS — Z882 Allergy status to sulfonamides status: Secondary | ICD-10-CM

## 2024-11-03 LAB — CBC
HCT: 44.7 % (ref 36.0–46.0)
Hemoglobin: 15.1 g/dL — ABNORMAL HIGH (ref 12.0–15.0)
MCH: 34.4 pg — ABNORMAL HIGH (ref 26.0–34.0)
MCHC: 33.8 g/dL (ref 30.0–36.0)
MCV: 101.8 fL — ABNORMAL HIGH (ref 80.0–100.0)
Platelets: 231 K/uL (ref 150–400)
RBC: 4.39 MIL/uL (ref 3.87–5.11)
RDW: 14.6 % (ref 11.5–15.5)
WBC: 13.8 K/uL — ABNORMAL HIGH (ref 4.0–10.5)
nRBC: 0.1 % (ref 0.0–0.2)

## 2024-11-03 LAB — URINALYSIS, ROUTINE W REFLEX MICROSCOPIC
Bilirubin Urine: NEGATIVE
Glucose, UA: >=500 mg/dL — AB
Hgb urine dipstick: NEGATIVE
Ketones, ur: NEGATIVE mg/dL
Leukocytes,Ua: NEGATIVE
Nitrite: NEGATIVE
Protein, ur: 100 mg/dL — AB
Specific Gravity, Urine: 1.018 (ref 1.005–1.030)
pH: 5 (ref 5.0–8.0)

## 2024-11-03 LAB — COMPREHENSIVE METABOLIC PANEL WITH GFR
ALT: 15 U/L (ref 0–44)
AST: 28 U/L (ref 15–41)
Albumin: 3.5 g/dL (ref 3.5–5.0)
Alkaline Phosphatase: 60 U/L (ref 38–126)
Anion gap: 14 (ref 5–15)
BUN: 39 mg/dL — ABNORMAL HIGH (ref 8–23)
CO2: 22 mmol/L (ref 22–32)
Calcium: 9.4 mg/dL (ref 8.9–10.3)
Chloride: 94 mmol/L — ABNORMAL LOW (ref 98–111)
Creatinine, Ser: 1.49 mg/dL — ABNORMAL HIGH (ref 0.44–1.00)
GFR, Estimated: 34 mL/min — ABNORMAL LOW (ref >60)
Glucose, Bld: 112 mg/dL — ABNORMAL HIGH (ref 70–99)
Potassium: 5 mmol/L (ref 3.5–5.1)
Sodium: 130 mmol/L — ABNORMAL LOW (ref 135–145)
Total Bilirubin: 0.6 mg/dL (ref 0.0–1.2)
Total Protein: 7.3 g/dL (ref 6.5–8.1)

## 2024-11-03 LAB — LIPASE, BLOOD: Lipase: <10 U/L — ABNORMAL LOW (ref 11–51)

## 2024-11-03 LAB — HEMOGLOBIN AND HEMATOCRIT, BLOOD
HCT: 43.3 % (ref 36.0–46.0)
Hemoglobin: 14.4 g/dL (ref 12.0–15.0)

## 2024-11-03 LAB — RESP PANEL BY RT-PCR (RSV, FLU A&B, COVID)  RVPGX2
Influenza A by PCR: NEGATIVE
Influenza B by PCR: NEGATIVE
Resp Syncytial Virus by PCR: NEGATIVE
SARS Coronavirus 2 by RT PCR: NEGATIVE

## 2024-11-03 LAB — TYPE AND SCREEN
ABO/RH(D): A POS
Antibody Screen: NEGATIVE

## 2024-11-03 LAB — PROTIME-INR
INR: 1.2 (ref 0.8–1.2)
Prothrombin Time: 15.4 s — ABNORMAL HIGH (ref 11.4–15.2)

## 2024-11-03 LAB — PRO BRAIN NATRIURETIC PEPTIDE: Pro Brain Natriuretic Peptide: 25129 pg/mL — ABNORMAL HIGH (ref <300.0)

## 2024-11-03 LAB — I-STAT CG4 LACTIC ACID, ED: Lactic Acid, Venous: 1.9 mmol/L (ref 0.5–1.9)

## 2024-11-03 LAB — POC OCCULT BLOOD, ED: Fecal Occult Bld: NEGATIVE

## 2024-11-03 LAB — ABO/RH: ABO/RH(D): A POS

## 2024-11-03 LAB — D-DIMER, QUANTITATIVE: D-Dimer, Quant: 5.4 ug{FEU}/mL — ABNORMAL HIGH (ref 0.00–0.50)

## 2024-11-03 MED ORDER — FUROSEMIDE 10 MG/ML IJ SOLN
40.0000 mg | Freq: Once | INTRAMUSCULAR | Status: AC
  Administered 2024-11-03: 40 mg via INTRAVENOUS
  Filled 2024-11-03: qty 4

## 2024-11-03 MED ORDER — MIRTAZAPINE 30 MG PO TABS
30.0000 mg | ORAL_TABLET | Freq: Every day | ORAL | Status: DC
  Administered 2024-11-03 – 2024-11-13 (×11): 30 mg via ORAL
  Filled 2024-11-03 (×7): qty 2
  Filled 2024-11-03: qty 1
  Filled 2024-11-03: qty 2
  Filled 2024-11-03: qty 1
  Filled 2024-11-03 (×2): qty 2

## 2024-11-03 MED ORDER — ACETAMINOPHEN 650 MG RE SUPP
650.0000 mg | Freq: Four times a day (QID) | RECTAL | Status: DC | PRN
  Filled 2024-11-03: qty 1

## 2024-11-03 MED ORDER — SIMVASTATIN 20 MG PO TABS
20.0000 mg | ORAL_TABLET | Freq: Every day | ORAL | Status: DC
  Administered 2024-11-03 – 2024-11-13 (×11): 20 mg via ORAL
  Filled 2024-11-03 (×12): qty 1

## 2024-11-03 MED ORDER — IOHEXOL 300 MG/ML  SOLN
75.0000 mL | Freq: Once | INTRAMUSCULAR | Status: AC | PRN
  Administered 2024-11-03: 60 mL via INTRAVENOUS

## 2024-11-03 MED ORDER — SODIUM CHLORIDE 0.9 % IV SOLN: 1.0000 g | INTRAVENOUS | Status: DC

## 2024-11-03 MED ORDER — ASPIRIN 81 MG PO CHEW: 81.0000 mg | CHEWABLE_TABLET | Freq: Every day | ORAL | Status: DC

## 2024-11-03 MED ORDER — PANTOPRAZOLE SODIUM 40 MG IV SOLR
40.0000 mg | INTRAVENOUS | Status: AC
  Administered 2024-11-03 (×2): 40 mg via INTRAVENOUS
  Filled 2024-11-03 (×2): qty 10

## 2024-11-03 MED ORDER — SODIUM CHLORIDE 0.9 % IV SOLN: INTRAVENOUS | Status: DC

## 2024-11-03 MED ORDER — ONDANSETRON HCL 4 MG PO TABS
4.0000 mg | ORAL_TABLET | Freq: Four times a day (QID) | ORAL | Status: DC | PRN
  Administered 2024-11-08: 4 mg via ORAL
  Filled 2024-11-03: qty 1

## 2024-11-03 MED ORDER — ENOXAPARIN SODIUM 30 MG/0.3ML IJ SOSY: 30.0000 mg | PREFILLED_SYRINGE | INTRAMUSCULAR | Status: DC

## 2024-11-03 MED ORDER — IPRATROPIUM BROMIDE 0.03 % NA SOLN: 2.0000 | Freq: Three times a day (TID) | NASAL | Status: DC | PRN

## 2024-11-03 MED ORDER — SODIUM CHLORIDE 0.9 % IV BOLUS
1000.0000 mL | Freq: Once | INTRAVENOUS | Status: AC
  Administered 2024-11-03: 1000 mL via INTRAVENOUS

## 2024-11-03 MED ORDER — PANTOPRAZOLE SODIUM 40 MG IV SOLR
40.0000 mg | Freq: Two times a day (BID) | INTRAVENOUS | Status: DC
  Administered 2024-11-04 (×2): 40 mg via INTRAVENOUS
  Filled 2024-11-03 (×2): qty 10

## 2024-11-03 MED ORDER — SODIUM CHLORIDE 0.9 % IV SOLN: 500.0000 mg | INTRAVENOUS | Status: DC

## 2024-11-03 MED ORDER — METOPROLOL SUCCINATE ER 50 MG PO TB24
50.0000 mg | ORAL_TABLET | Freq: Every morning | ORAL | Status: DC
  Administered 2024-11-04 – 2024-11-14 (×10): 50 mg via ORAL
  Filled 2024-11-03 (×12): qty 1

## 2024-11-03 MED ORDER — TRAZODONE HCL 50 MG PO TABS
25.0000 mg | ORAL_TABLET | Freq: Every evening | ORAL | Status: DC | PRN
  Administered 2024-11-04 – 2024-11-11 (×6): 25 mg via ORAL
  Filled 2024-11-03: qty 1
  Filled 2024-11-03: qty 0.5
  Filled 2024-11-03 (×6): qty 1

## 2024-11-03 MED ORDER — ACETAMINOPHEN 325 MG PO TABS
650.0000 mg | ORAL_TABLET | Freq: Four times a day (QID) | ORAL | Status: DC | PRN
  Administered 2024-11-03 – 2024-11-14 (×19): 650 mg via ORAL
  Filled 2024-11-03 (×25): qty 2

## 2024-11-03 MED ORDER — PANTOPRAZOLE SODIUM 40 MG PO TBEC: 40.0000 mg | DELAYED_RELEASE_TABLET | Freq: Two times a day (BID) | ORAL | Status: DC

## 2024-11-03 MED ORDER — ONDANSETRON HCL 4 MG/2ML IJ SOLN: 4.0000 mg | Freq: Four times a day (QID) | INTRAMUSCULAR | Status: DC | PRN

## 2024-11-03 MED ORDER — SODIUM CHLORIDE 0.9 % IV SOLN
1.0000 g | Freq: Once | INTRAVENOUS | Status: AC
  Administered 2024-11-03: 1 g via INTRAVENOUS
  Filled 2024-11-03: qty 10

## 2024-11-03 MED ORDER — VENLAFAXINE HCL ER 150 MG PO CP24
150.0000 mg | ORAL_CAPSULE | Freq: Every day | ORAL | Status: DC
  Administered 2024-11-04 – 2024-11-14 (×11): 150 mg via ORAL
  Filled 2024-11-03 (×12): qty 1

## 2024-11-03 MED ORDER — SODIUM CHLORIDE (PF) 0.9 % IJ SOLN
INTRAMUSCULAR | Status: AC
  Filled 2024-11-03: qty 50

## 2024-11-03 MED ORDER — MONTELUKAST SODIUM 10 MG PO TABS
10.0000 mg | ORAL_TABLET | Freq: Every day | ORAL | Status: DC
  Administered 2024-11-04 – 2024-11-14 (×10): 10 mg via ORAL
  Filled 2024-11-03 (×12): qty 1

## 2024-11-03 MED ORDER — ONDANSETRON HCL 4 MG/2ML IJ SOLN
4.0000 mg | Freq: Once | INTRAMUSCULAR | Status: AC
  Administered 2024-11-03: 4 mg via INTRAVENOUS
  Filled 2024-11-03: qty 2

## 2024-11-03 MED ORDER — BUDESON-GLYCOPYRROL-FORMOTEROL 160-9-4.8 MCG/ACT IN AERO
2.0000 | INHALATION_SPRAY | Freq: Two times a day (BID) | RESPIRATORY_TRACT | Status: DC
  Administered 2024-11-03 – 2024-11-04 (×2): 2 via RESPIRATORY_TRACT
  Filled 2024-11-03: qty 5.9

## 2024-11-03 MED ORDER — ALBUTEROL SULFATE (2.5 MG/3ML) 0.083% IN NEBU
2.5000 mg | INHALATION_SOLUTION | RESPIRATORY_TRACT | Status: DC | PRN
  Administered 2024-11-03: 2.5 mg via RESPIRATORY_TRACT
  Filled 2024-11-03 (×2): qty 3

## 2024-11-03 MED ORDER — SODIUM CHLORIDE 0.9 % IV SOLN
500.0000 mg | Freq: Once | INTRAVENOUS | Status: AC
  Administered 2024-11-03: 500 mg via INTRAVENOUS
  Filled 2024-11-03: qty 5

## 2024-11-03 MED ORDER — EMPAGLIFLOZIN 10 MG PO TABS
10.0000 mg | ORAL_TABLET | Freq: Every day | ORAL | Status: DC
  Filled 2024-11-03: qty 1

## 2024-11-03 NOTE — ED Provider Notes (Addendum)
 [place] EMERGENCY DEPARTMENT AT [facility] Provider Note   CSN: [id number] Arrival date & time: 11/03/24  1126     Patient presents with: Emesis   Carol [name] is a 88 y.o. female.    Emesis Associated symptoms: abdominal pain     88 year old female with medical history significant for HTN, anxiety, GERD, arthritis, colitis CHF last EF 40 to 45% in February 2024 presenting to the emergency department with weakness, fatigue, abdominal pain, black emesis.  The patient had trouble getting up off the floor earlier today.  She denies falling.  She was found to be hanging onto her bed, denies traumatic injury, had difficulty pulling herself up due to profound weakness and fatigue.  She subsequently then had an episode of black emesis.  Denies any Pepto-Bismol use.  Has been feeling extremely weak and fatigued.  She denies any chest pain, shortness of breath.  She is not on anticoagulation.  Prior to Admission medications   Medication Sig Start Date End Date Taking? Authorizing Provider  acetaminophen  (TYLENOL  8 HOUR ARTHRITIS PAIN) 650 MG CR tablet Take 650-1,300 mg by mouth every 8 (eight) hours as needed for pain. 02/01/09   [provider]  aspirin  81 MG chewable tablet Chew 1 tablet (81 mg total) by mouth daily with lunch. 07/03/23   Patwardhan, Newman PARAS, MD  chlorpheniramine (CHLOR-TRIMETON) 4 MG tablet Take 4 mg by mouth daily as needed for allergies.    [provider]  empagliflozin  (JARDIANCE ) 10 MG TABS tablet Take 1 tablet (10 mg total) by mouth daily. 04/17/24   Patwardhan, Newman PARAS, MD  famotidine  (PEPCID ) 10 MG tablet Take 20 mg by mouth daily as needed for heartburn or indigestion.    [provider]  Fluticasone -Umeclidin-Vilant (TRELEGY ELLIPTA ) 100-62.5-25 MCG/ACT AEPB Inhale 1 puff into the lungs daily. 12/18/23   Alva, Rakesh V, MD  ipratropium (ATROVENT ) 0.03 % nasal spray Place 2 sprays into both nostrils 3 (three) times daily.  06/10/23   Cobb, Comer GAILS, NP  ivabradine  (CORLANOR ) 5 MG TABS tablet TAKE 1 TABLET TWICE A DAY WITH MEALS 09/10/24   Patwardhan, Manish J, MD  loratadine (CLARITIN) 10 MG tablet Take 10 mg by mouth daily.    [provider]  LORazepam  (ATIVAN ) 1 MG tablet Take 1 tablet (1 mg total) by mouth daily at 8 pm. 09/12/22   Arrien, Elidia Sieving, MD  losartan  (COZAAR ) 25 MG tablet TAKE 1 TABLET EVERY EVENING 07/23/24   Patwardhan, Newman PARAS, MD  metoprolol  succinate (TOPROL -XL) 50 MG 24 hr tablet TAKE 1 TABLET IN THE MORNING 07/23/24   Patwardhan, Manish J, MD  mirtazapine  (REMERON ) 30 MG tablet Take 30 mg by mouth at bedtime.    [provider]  montelukast  (SINGULAIR ) 10 MG tablet Take 10 mg by mouth daily. 06/26/24   [provider]  Multiple Vitamins-Minerals (MULTI VITAMIN/MINERALS) TABS Take 1 tablet by mouth daily.    [provider]  pantoprazole  (PROTONIX ) 40 MG tablet Take 40 mg by mouth 2 (two) times daily. 05/23/22   [provider]  Polyethyl Glycol-Propyl Glycol (SYSTANE OP) Place 1 drop into both eyes 3 (three) times daily.    [provider]  polyethylene glycol (MIRALAX  / GLYCOLAX ) 17 g packet Take 17 g by mouth daily. Patient taking differently: Take 17 g by mouth daily as needed for moderate constipation. 09/15/22   Arrien, Mauricio Daniel, MD  raloxifene  (EVISTA ) 60 MG tablet Take 60 mg by mouth daily. 12/30/17  [provider]  simvastatin  (ZOCOR ) 20 MG tablet TAKE 1 TABLET AT BEDTIME 08/05/24   Patwardhan, Manish J, MD  spironolactone  (ALDACTONE ) 25 MG tablet Take 0.5 tablets (12.5 mg total) by mouth daily. 10/29/24   Patwardhan, Newman PARAS, MD  triamcinolone (NASACORT ALLERGY 24HR) 55 MCG/ACT AERO nasal inhaler Place 2 sprays into the nose daily.    [provider]  triamcinolone ointment (KENALOG) 0.5 % Apply 1 Application topically 2 (two) times daily. 05/22/19   [provider]  venlafaxine  XR (EFFEXOR -XR)  150 MG 24 hr capsule Take 150 mg by mouth daily with breakfast. 01/30/23   [provider]    Allergies: Ciprofloxacin , Nitrofuran derivatives, Paxil [paroxetine], Penicillins, and Sulfa antibiotics    Review of Systems  Gastrointestinal:  Positive for abdominal pain, nausea and vomiting.  All other systems reviewed and are negative.   Updated Vital Signs BP (!) 112/59   Pulse 71   Temp 97.6 F (36.4 C) (Oral)   Resp (!) 22   Ht 5' (1.524 m)   Wt 43.1 kg   SpO2 100%   BMI 18.55 kg/m   Physical Exam Vitals and nursing note reviewed.  Constitutional:      General: She is not in acute distress.    Appearance: She is well-developed. She is ill-appearing.     Comments: GCS 15, AAOx3, ABC intact  HENT:     Head: Normocephalic and atraumatic.     Mouth/Throat:     Mouth: Mucous membranes are dry.     Comments: Black discoloration of the tissues of the mouth Eyes:     Conjunctiva/sclera: Conjunctivae normal.  Cardiovascular:     Rate and Rhythm: Normal rate and regular rhythm.     Heart sounds:     No friction rub.  Pulmonary:     Effort: Pulmonary effort is normal. No respiratory distress.     Breath sounds: Normal breath sounds.  Abdominal:     Palpations: Abdomen is soft.     Tenderness: There is abdominal tenderness.  Musculoskeletal:        General: No swelling.     Cervical back: Neck supple.     Right lower leg: No edema.     Left lower leg: No edema.  Skin:    General: Skin is warm and dry.     Capillary Refill: Capillary refill takes less than 2 seconds.  Neurological:     Mental Status: She is alert.     Comments: MENTAL STATUS EXAM:    Orientation: Alert and oriented to person, place and time.  Memory: Cooperative, follows commands well.  Language: Speech is clear and language is normal.   CRANIAL NERVES:    CN 2 (Optic): Visual fields intact to confrontation.  CN 3,4,6 (EOM): Pupils equal and reactive to light. Full extraocular eye movement  without nystagmus.  CN 5 (Trigeminal): Facial sensation is normal, no weakness of masticatory muscles.  CN 7 (Facial): No facial weakness or asymmetry.  CN 8 (Auditory): Auditory acuity grossly normal.  CN 9,10 (Glossophar): The uvula is midline, the palate elevates symmetrically.  CN 11 (spinal access): Normal sternocleidomastoid and trapezius strength.  CN 12 (Hypoglossal): The tongue is midline. No atrophy or fasciculations.Carol [name]   MOTOR:  Muscle Strength: 5/5RUE, 5/5LUE, 5/5RLE, 5/5LLE.   COORDINATION:  No tremor  SENSATION:   Intact to light touch all four extremities.     Psychiatric:        Mood and Affect: Mood normal.     (  all labs ordered are listed, but only abnormal results are displayed) Labs Reviewed  LIPASE, BLOOD - Abnormal; Notable for the following components:      Result Value   Lipase <10 (*)    All other components within normal limits  COMPREHENSIVE METABOLIC PANEL WITH GFR - Abnormal; Notable for the following components:   Sodium 130 (*)    Chloride 94 (*)    Glucose, Bld 112 (*)    BUN 39 (*)    Creatinine, Ser 1.49 (*)    GFR, Estimated 34 (*)    All other components within normal limits  CBC - Abnormal; Notable for the following components:   WBC 13.8 (*)    Hemoglobin 15.1 (*)    MCV 101.8 (*)    MCH 34.4 (*)    All other components within normal limits  URINALYSIS, ROUTINE W REFLEX MICROSCOPIC - Abnormal; Notable for the following components:   APPearance HAZY (*)    Glucose, UA >=500 (*)    Protein, ur 100 (*)    Bacteria, UA RARE (*)    All other components within normal limits  PROTIME-INR - Abnormal; Notable for the following components:   Prothrombin Time 15.4 (*)    All other components within normal limits  D-DIMER, QUANTITATIVE  I-STAT CG4 LACTIC ACID, ED  POC OCCULT BLOOD, ED  TYPE AND SCREEN  ABO/RH    EKG: EKG Interpretation Date/Time:  Tuesday November 03 2024 12:12:30 EST Ventricular Rate:  80 PR Interval:  164 QRS  Duration:  130 QT Interval:  368 QTC Calculation: 425 R Axis:   49  Text Interpretation: Sinus rhythm Left bundle branch block Confirmed by Jerrol Agent ([id number]) on 11/03/2024 1:31:19 PM  Radiology: CT ABDOMEN PELVIS W CONTRAST Result Date: 11/03/2024 CLINICAL DATA:  Abdominal pain, vomiting, weakness, left lower abdominal tenderness EXAM: CT ABDOMEN AND PELVIS WITH CONTRAST TECHNIQUE: Multidetector CT imaging of the abdomen and pelvis was performed using the standard protocol following bolus administration of intravenous contrast. RADIATION DOSE REDUCTION: This exam was performed according to the departmental dose-optimization program which includes automated exposure control, adjustment of the mA and/or kV according to patient size and/or use of iterative reconstruction technique. CONTRAST:  60mL OMNIPAQUE  IOHEXOL  300 MG/ML  SOLN COMPARISON:  11/03/2024, 04/27/2022 FINDINGS: Lower chest: Airspace disease throughout the right middle and right lower lobes consistent with infection or aspiration. Background emphysema. Hepatobiliary: No focal liver abnormality is seen. No gallstones, gallbladder wall thickening, or biliary dilatation. Pancreas: Unremarkable. No pancreatic ductal dilatation or surrounding inflammatory changes. Spleen: Normal in size without focal abnormality. Adrenals/Urinary Tract: Nonspecific bilateral adrenal thickening measuring 9 mm on the right and 8 mm on the left. The kidneys enhance normally and symmetrically. No urinary tract calculi or obstructive uropathy. Bladder is unremarkable. Stomach/Bowel: No bowel obstruction or ileus. Normal appendix right lower quadrant. Diffuse diverticulosis most pronounced within the sigmoid colon, with no evidence of acute diverticulitis. No bowel wall thickening or inflammatory change. There is a persistent 2.4 cm hyperdense structure within the distal sigmoid colon, reference image 57/2, which may reflect a fecalith or barium filled diverticulum.  Vascular/Lymphatic: Diffuse atherosclerosis of the aorta and its branches. No pathologic adenopathy. Reproductive: Uterus and bilateral adnexa are unremarkable. Other: No free fluid or free intraperitoneal gas. No abdominal wall hernia. Musculoskeletal: No acute or destructive bony abnormalities. The bones are osteopenic. Stable degenerative anterolisthesis of L4 on L5. Reconstructed images demonstrate no additional findings. IMPRESSION: 1. Right middle and right lower lobe pneumonia. 2. No  acute intra-abdominal or intrapelvic process. 3. Colonic diverticulosis without diverticulitis. 4. Aortic Atherosclerosis (ICD10-I70.0) and Emphysema (ICD10-J43.9). Electronically Signed   By: Ozell Daring M.D.   On: 11/03/2024 15:36   DG Chest Port 1 View Result Date: 11/03/2024 CLINICAL DATA:  Weakness.  Cough. EXAM: PORTABLE CHEST 1 VIEW COMPARISON:  Chest radiograph dated 09/06/2022. CT chest dated 02/12/2022. FINDINGS: Mildly prominent cardiac silhouette. Aortic atherosclerosis. Small layering right pleural effusion with right basilar opacity. Background chronic interstitial coarsening, compatible with history of emphysema. No pneumothorax. No acute osseous abnormality. IMPRESSION: 1. Small layering right pleural effusion with right basilar airspace disease. 2. Emphysema. Electronically Signed   By: Harrietta Sherry M.D.   On: 11/03/2024 14:11     .Critical Care  Performed by: Jerrol Agent, MD Authorized by: Jerrol Agent, MD   Critical care provider statement:    Critical care time (minutes):  30   Critical care was necessary to treat or prevent imminent or life-threatening deterioration of the following conditions:  Respiratory failure   Critical care was time spent personally by me on the following activities:  Development of treatment plan with patient or surrogate, discussions with consultants, evaluation of patient's response to treatment, examination of patient, ordering and review of laboratory  studies, ordering and review of radiographic studies, ordering and performing treatments and interventions, pulse oximetry, re-evaluation of patient's condition and review of old charts   Care discussed with: admitting provider      Medications Ordered in the ED  pantoprazole  (PROTONIX ) injection 40 mg (40 mg Intravenous Given 11/03/24 1322)    Followed by  pantoprazole  (PROTONIX ) injection 40 mg (has no administration in time range)  cefTRIAXone  (ROCEPHIN ) 1 g in sodium chloride  0.9 % 100 mL IVPB (1 g Intravenous New Bag/Given 11/03/24 1609)  azithromycin  (ZITHROMAX ) 500 mg in sodium chloride  0.9 % 250 mL IVPB (has no administration in time range)  sodium chloride  0.9 % bolus 1,000 mL (0 mLs Intravenous Stopped 11/03/24 1543)  iohexol  (OMNIPAQUE ) 300 MG/ML solution 75 mL (60 mLs Intravenous Contrast Given 11/03/24 1350)  ondansetron  (ZOFRAN ) injection 4 mg (4 mg Intravenous Given 11/03/24 1410)  furosemide  (LASIX ) injection 40 mg (40 mg Intravenous Given 11/03/24 1542)                                    Medical Decision Making Amount and/or Complexity of Data Reviewed Labs: ordered. Radiology: ordered.  Risk Prescription drug management. Decision regarding hospitalization.     88 year old female with medical history significant for HTN, anxiety, GERD, arthritis, colitis CHF last EF 40 to 45% in February 2024 presenting to the emergency department with weakness, fatigue, abdominal pain, black emesis.  The patient had trouble getting up off the floor earlier today.  She denies falling.  She was found to be hanging onto her bed, denies traumatic injury, had difficulty pulling herself up due to profound weakness and fatigue.  She subsequently then had an episode of black emesis.  Denies any Pepto-Bismol use.  Has been feeling extremely weak and fatigued.  She denies any chest pain, shortness of breath.  She is not on anticoagulation.  On arrival, patient was afebrile, not tachycardic heart  rate 62, BP 102/74, not tachypneic RR 16, saturating 94% on her home 4 L O2 via nasal cannula.  Patient presenting with dry mucous membranes, actively vomiting dark material concerning for potential GI bleed.  Has been profoundly fatigued over the  past few days.  Family states that she has had a cough had been endorsing some chest discomfort.  On exam the patient had clear lungs, appeared fatigued, dry mucous membranes, evidence of dark emesis discoloration in her mouth and around her mouth, abdomen that was mildly tender on exam.   Differential diagnosis includes PE, PNA, ACS, PTX, GI bleed, gastritis, viral syndrome, other acute intraabdominal abnormality.   IV access was obtained and the patient was administered IV fluid bolus, IV Zofran  as well as IV Protonix  given the patient's report of black emesis concerning for GI bleed.  EKG: Sinus rhythm, ventricular rate 8 0, left bundle branch block present, negative Sgarbossa's criteria  Chest x-ray: Small layering right pleural effusion with right basilar airspace disease, emphysema  Labs: CBC with a leukocytosis to 13.8, evidence of hemoconcentration with a hemoglobin of 15.1, CMP with an AKI with a creatinine of 1.49 with an elevated BUN of 39, lactic acid normal at 1.9, lipase normal, INR normal, urinalysis without evidence of UTI.  I went to reassess the patient to obtain a stool sample for Hemoccult testing and found the patient to have oxygen  saturations in the low 80s.  This was after 650 cc of IV fluid administered.  Fluids were stopped and the patient was escalated to a nonrebreather with improvement in her oxygen  saturations to 100%.  Given her history of CHF, administered a one-time dose of Lasix .  Patient confirmed to be DNR limited in discussion with the patient and family bedside.  In the setting of the patient's presentation, CT abdomen pelvis was obtained: IMPRESSION:  1. Right middle and right lower lobe pneumonia.  2. No acute  intra-abdominal or intrapelvic process.  3. Colonic diverticulosis without diverticulitis.  4. Aortic Atherosclerosis (ICD10-I70.0) and Emphysema (ICD10-J43.9).   Patient with evidence of pneumonia, will start the patient on antibiotics.  Did consult gastroenterology in the setting of the patient's black emesis, spoke with Dr. Elicia who will see in consultation, hospitalist medicine consulted for admission, Dr. Roxane accepting.     Final diagnoses:  AKI (acute kidney injury)  Community acquired pneumonia, unspecified laterality  Acute respiratory failure with hypoxia (HCC)  Dark emesis    ED Discharge Orders     None          Jerrol Agent, MD 11/03/24 1612    Jerrol Agent, MD 11/03/24 1626

## 2024-11-03 NOTE — ED Triage Notes (Signed)
 BIB EMS from home, Black vomit since last night. Denies abd pain, feels weak, tenderness to left lower abd. Cough (normal due to smoking) 98/52-74-16-CBG 169

## 2024-11-03 NOTE — H&P (Signed)
 History and Physical  Carol  LURIA [name] FMW:[id number] DOB: [birth year] DOA: 11/03/2024  PCP: Arloa Elsie SAUNDERS, MD   Chief Complaint: Cough, confusion  HPI: Carol  A [name] is a 88 y.o. female with medical history significant for combined systolic and diastolic congestive heart failure, hypertension, GERD being admitted to the hospital with acute hypoxic respiratory failure and community-acquired pneumonia.  History is provided by the patient's daughter and one of her granddaughters who are at the bedside.  She lives alone, with the assistance of her daughters and granddaughter.  She was in her usual state of health until this past week, when she started to say that she felt congested and felt like she might have a cold.  On Sunday 12/7 family noticed that she had a bit of a nonproductive but wet sounding cough.  She also seemed a little bit confused, which was abnormal for her.  She has continued to have a cough and congestion, has not complained of shortness of breath, chest pain, fevers, nausea or any other complaint.  This morning when she woke up, one of her daughters noticed that she had very dark black material on her face, as if she had thrown up.  The patient does not recall feeling nauseous, but says that when she came to the ER today she did have some diffuse abdominal discomfort.  Review of Systems: Please see HPI for pertinent positives and negatives. A complete 10 system review of systems are otherwise negative.  Past Medical History:  Diagnosis Date   Anxiety    Arthritis    arthritis -hips   Colitis    Complication of anesthesia    B/P dropped post colonoscopy x1   GERD (gastroesophageal reflux disease)    Headache(784.0)    migraines rare occ.   Hypertension    Urinary tract infection early aug 2014   Past Surgical History:  Procedure Laterality Date   BIOPSY  05/21/2023   Procedure: BIOPSY;  Surgeon: Dianna Specking, MD;  Location: [facility];  Service:  Gastroenterology;;   CATARACT EXTRACTION, BILATERAL Bilateral    COLONOSCOPY WITH PROPOFOL  N/A 08/03/2013   Procedure: COLONOSCOPY WITH PROPOFOL ;  Surgeon: Lynwood LITTIE Celestia Mickey., MD;  Location: [facility];  Service: Endoscopy;  Laterality: N/A;  ultra thin colon scope   COLONOSCOPY WITH PROPOFOL  N/A 02/04/2018   Procedure: COLONOSCOPY WITH PROPOFOL ;  Surgeon: Celestia Lynwood, MD;  Location: [facility];  Service: Endoscopy;  Laterality: N/A;   ESOPHAGOGASTRODUODENOSCOPY (EGD) WITH PROPOFOL  N/A 05/21/2023   Procedure: ESOPHAGOGASTRODUODENOSCOPY (EGD) WITH PROPOFOL ;  Surgeon: Dianna Specking, MD;  Location: [facility];  Service: Gastroenterology;  Laterality: N/A;   LASIK     Social History:  reports that she quit smoking about 2 years ago. Her smoking use included cigarettes. She started smoking about 42 years ago. She has a 16.9 pack-year smoking history. She has never used smokeless tobacco. She reports that she does not drink alcohol and does not use drugs.  Allergies  Allergen Reactions   Ciprofloxacin  Nausea Only and Other (See Comments)    Nausea and legs weak   Nitrofuran Derivatives Nausea Only and Other (See Comments)    nitrofuratonin bad nausea, legs weak   Paxil [Paroxetine] Other (See Comments)    Sick on stomach   Penicillins Other (See Comments)    Sick on stomach   Sulfa Antibiotics Other (See Comments)    jittery    History reviewed. No pertinent family history.   Prior to Admission medications   Medication Sig Start Date  End Date Taking? Authorizing Provider  acetaminophen  (TYLENOL  8 HOUR ARTHRITIS PAIN) 650 MG CR tablet Take 650-1,300 mg by mouth every 8 (eight) hours as needed for pain. 02/01/09   [provider]  aspirin  81 MG chewable tablet Chew 1 tablet (81 mg total) by mouth daily with lunch. 07/03/23   Patwardhan, Newman PARAS, MD  chlorpheniramine (CHLOR-TRIMETON) 4 MG tablet Take 4 mg by mouth daily as needed for allergies.    [provider]   empagliflozin  (JARDIANCE ) 10 MG TABS tablet Take 1 tablet (10 mg total) by mouth daily. 04/17/24   Patwardhan, Newman PARAS, MD  famotidine  (PEPCID ) 10 MG tablet Take 20 mg by mouth daily as needed for heartburn or indigestion.    [provider]  Fluticasone -Umeclidin-Vilant (TRELEGY ELLIPTA ) 100-62.5-25 MCG/ACT AEPB Inhale 1 puff into the lungs daily. 12/18/23   Alva, Rakesh V, MD  ipratropium (ATROVENT ) 0.03 % nasal spray Place 2 sprays into both nostrils 3 (three) times daily. 06/10/23   Cobb, Comer GAILS, NP  ivabradine  (CORLANOR ) 5 MG TABS tablet TAKE 1 TABLET TWICE A DAY WITH MEALS 09/10/24   Patwardhan, Manish J, MD  loratadine (CLARITIN) 10 MG tablet Take 10 mg by mouth daily.    [provider]  LORazepam  (ATIVAN ) 1 MG tablet Take 1 tablet (1 mg total) by mouth daily at 8 pm. 09/12/22   Arrien, Elidia Sieving, MD  losartan  (COZAAR ) 25 MG tablet TAKE 1 TABLET EVERY EVENING 07/23/24   Patwardhan, Newman PARAS, MD  metoprolol  succinate (TOPROL -XL) 50 MG 24 hr tablet TAKE 1 TABLET IN THE MORNING 07/23/24   Patwardhan, Manish J, MD  mirtazapine  (REMERON ) 30 MG tablet Take 30 mg by mouth at bedtime.    [provider]  montelukast  (SINGULAIR ) 10 MG tablet Take 10 mg by mouth daily. 06/26/24   [provider]  Multiple Vitamins-Minerals (MULTI VITAMIN/MINERALS) TABS Take 1 tablet by mouth daily.    [provider]  pantoprazole  (PROTONIX ) 40 MG tablet Take 40 mg by mouth 2 (two) times daily. 05/23/22   [provider]  Polyethyl Glycol-Propyl Glycol (SYSTANE OP) Place 1 drop into both eyes 3 (three) times daily.    [provider]  polyethylene glycol (MIRALAX  / GLYCOLAX ) 17 g packet Take 17 g by mouth daily. Patient taking differently: Take 17 g by mouth daily as needed for moderate constipation. 09/15/22   Arrien, Mauricio Daniel, MD  raloxifene  (EVISTA ) 60 MG tablet Take 60 mg by mouth daily. 12/30/17   [provider]  simvastatin   (ZOCOR ) 20 MG tablet TAKE 1 TABLET AT BEDTIME 08/05/24   Patwardhan, Manish J, MD  spironolactone  (ALDACTONE ) 25 MG tablet Take 0.5 tablets (12.5 mg total) by mouth daily. 10/29/24   Patwardhan, Newman PARAS, MD  triamcinolone (NASACORT ALLERGY 24HR) 55 MCG/ACT AERO nasal inhaler Place 2 sprays into the nose daily.    [provider]  triamcinolone ointment (KENALOG) 0.5 % Apply 1 Application topically 2 (two) times daily. 05/22/19   [provider]  venlafaxine  XR (EFFEXOR -XR) 150 MG 24 hr capsule Take 150 mg by mouth daily with breakfast. 01/30/23   [provider]    Physical Exam: BP (!) 112/59   Pulse 71   Temp 98 F (36.7 C) (Oral)   Resp (!) 22   Ht 5' (1.524 m)   Wt 43.1 kg   SpO2 100%   BMI 18.55 kg/m  General:  Alert, oriented x 4, speaking in full sentences, calm, in no acute distress, wearing  nonrebreather.  Her daughter and granddaughter are at the bedside. Cardiovascular: RRR, no murmurs or rubs, no peripheral edema  Respiratory: clear to auscultation bilaterally, no wheezes, no crackles but breath sounds are distant Abdomen: soft, nontender, nondistended, normal bowel tones heard  Skin: dry, no rashes  Musculoskeletal: no joint effusions, normal range of motion  Psychiatric: appropriate affect, normal speech  Neurologic: extraocular muscles intact, clear speech, moving all extremities with intact sensorium         Labs on Admission:  Basic Metabolic Panel: Recent Labs  Lab 11/03/24 1220  NA 130*  K 5.0  CL 94*  CO2 22  GLUCOSE 112*  BUN 39*  CREATININE 1.49*  CALCIUM  9.4   Liver Function Tests: Recent Labs  Lab 11/03/24 1220  AST 28  ALT 15  ALKPHOS 60  BILITOT 0.6  PROT 7.3  ALBUMIN 3.5   Recent Labs  Lab 11/03/24 1220  LIPASE <10*   No results for input(s): AMMONIA in the last 168 hours. CBC: Recent Labs  Lab 11/03/24 1220  WBC 13.8*  HGB 15.1*  HCT 44.7  MCV 101.8*  PLT 231   Cardiac Enzymes: No results for  input(s): CKTOTAL, CKMB, CKMBINDEX, TROPONINI in the last 168 hours. BNP (last 3 results) No results for input(s): BNP in the last 8760 hours.  ProBNP (last 3 results) No results for input(s): PROBNP in the last 8760 hours.  CBG: No results for input(s): GLUCAP in the last 168 hours.  Radiological Exams on Admission: CT ABDOMEN PELVIS W CONTRAST Result Date: 11/03/2024 CLINICAL DATA:  Abdominal pain, vomiting, weakness, left lower abdominal tenderness EXAM: CT ABDOMEN AND PELVIS WITH CONTRAST TECHNIQUE: Multidetector CT imaging of the abdomen and pelvis was performed using the standard protocol following bolus administration of intravenous contrast. RADIATION DOSE REDUCTION: This exam was performed according to the departmental dose-optimization program which includes automated exposure control, adjustment of the mA and/or kV according to patient size and/or use of iterative reconstruction technique. CONTRAST:  60mL OMNIPAQUE  IOHEXOL  300 MG/ML  SOLN COMPARISON:  11/03/2024, 04/27/2022 FINDINGS: Lower chest: Airspace disease throughout the right middle and right lower lobes consistent with infection or aspiration. Background emphysema. Hepatobiliary: No focal liver abnormality is seen. No gallstones, gallbladder wall thickening, or biliary dilatation. Pancreas: Unremarkable. No pancreatic ductal dilatation or surrounding inflammatory changes. Spleen: Normal in size without focal abnormality. Adrenals/Urinary Tract: Nonspecific bilateral adrenal thickening measuring 9 mm on the right and 8 mm on the left. The kidneys enhance normally and symmetrically. No urinary tract calculi or obstructive uropathy. Bladder is unremarkable. Stomach/Bowel: No bowel obstruction or ileus. Normal appendix right lower quadrant. Diffuse diverticulosis most pronounced within the sigmoid colon, with no evidence of acute diverticulitis. No bowel wall thickening or inflammatory change. There is a persistent 2.4 cm  hyperdense structure within the distal sigmoid colon, reference image 57/2, which may reflect a fecalith or barium filled diverticulum. Vascular/Lymphatic: Diffuse atherosclerosis of the aorta and its branches. No pathologic adenopathy. Reproductive: Uterus and bilateral adnexa are unremarkable. Other: No free fluid or free intraperitoneal gas. No abdominal wall hernia. Musculoskeletal: No acute or destructive bony abnormalities. The bones are osteopenic. Stable degenerative anterolisthesis of L4 on L5. Reconstructed images demonstrate no additional findings. IMPRESSION: 1. Right middle and right lower lobe pneumonia. 2. No acute intra-abdominal or intrapelvic process. 3. Colonic diverticulosis without diverticulitis. 4. Aortic Atherosclerosis (ICD10-I70.0) and Emphysema (ICD10-J43.9). Electronically Signed   By: Ozell Daring M.D.   On: 11/03/2024 15:36   DG Chest [facility]  Result Date: 11/03/2024 CLINICAL DATA:  Weakness.  Cough. EXAM: PORTABLE CHEST 1 VIEW COMPARISON:  Chest radiograph dated 09/06/2022. CT chest dated 02/12/2022. FINDINGS: Mildly prominent cardiac silhouette. Aortic atherosclerosis. Small layering right pleural effusion with right basilar opacity. Background chronic interstitial coarsening, compatible with history of emphysema. No pneumothorax. No acute osseous abnormality. IMPRESSION: 1. Small layering right pleural effusion with right basilar airspace disease. 2. Emphysema. Electronically Signed   By: Harrietta Sherry M.D.   On: 11/03/2024 14:11   Assessment/Plan Carol  A [name] is a 88 y.o. female with medical history significant for combined systolic and diastolic congestive heart failure, hypertension, GERD being admitted to the hospital with acute hypoxic respiratory failure and community-acquired pneumonia.   Severe sepsis due to community-acquired pneumonia-with tachypnea, mild leukocytosis, and hypoxia.  Complicated by AKI.  Lactic acid is normal, patient is hemodynamically  stable. -Inpatient admission -Empiric IV azithromycin  and IV Rocephin  -Check viral respiratory panel -No further fluids unless clearly indicated, due to hypoxia in the setting of heart failure, please see below  Acute hypoxic respiratory failure-likely due to combination of community-acquired pneumonia on a background of poor pulmonary reserve due to COPD.  She became hypoxic after receiving IV fluids in the emergency department. -Supplemental oxygen , wean as tolerated -Treat pneumonia as above, avoid further fluid boluses unless clearly indicated for sepsis or hypotension  Chronic combined systolic and diastolic congestive heart failure-last echo February 2024 with EF 40%, grade 1 diastolic dysfunction.  Patient appears euvolemic on exam, without overt signs of heart failure. -Continue home metoprolol  -Check BNP, consider gentle diuresis if hypoxia persists  Dark emesis-patient is on daily aspirin , but otherwise not on any blood thinners.  No prior history of GI bleeding.  She is hemodynamically stable, and hemoglobin is in normal range. -Hold aspirin , avoid blood thinners -Trend hemoglobin -Clear liquid diet -ER provider has discussed with [facility], who will consult  AKI superimposed on normal baseline renal function -Hold nephrotoxins including losartan  and Aldactone   Hyperlipidemia-statin  DVT prophylaxis: SCDs only    Code Status: Limited: Do not attempt resuscitation (DNR) -DNR-LIMITED -Do Not Intubate/DNI   Consults called: [facility]  Admission status: The appropriate patient status for this patient is INPATIENT. Inpatient status is judged to be reasonable and necessary in order to provide the required intensity of service to ensure the patient's safety. The patient's presenting symptoms, physical exam findings, and initial radiographic and laboratory data in the context of their chronic comorbidities is felt to place them at high risk for further clinical deterioration.  Furthermore, it is not anticipated that the patient will be medically stable for discharge from the hospital within 2 midnights of admission.    I certify that at the point of admission it is my clinical judgment that the patient will require inpatient hospital care spanning beyond 2 midnights from the point of admission due to high intensity of service, high risk for further deterioration and high frequency of surveillance required  Time spent: 52 minutes  Carol Limones CHRISTELLA Gail MD [facility] Pager [phone]  If 7PM-7AM, please contact night-coverage [email or web address] Password [email or web address]  11/03/2024, 4:36 PM

## 2024-11-04 LAB — BASIC METABOLIC PANEL WITH GFR
Anion gap: 14 (ref 5–15)
BUN: 41 mg/dL — ABNORMAL HIGH (ref 8–23)
CO2: 19 mmol/L — ABNORMAL LOW (ref 22–32)
Calcium: 8.2 mg/dL — ABNORMAL LOW (ref 8.9–10.3)
Chloride: 98 mmol/L (ref 98–111)
Creatinine, Ser: 1.33 mg/dL — ABNORMAL HIGH (ref 0.44–1.00)
GFR, Estimated: 39 mL/min — ABNORMAL LOW (ref >60)
Glucose, Bld: 69 mg/dL — ABNORMAL LOW (ref 70–99)
Potassium: 4.8 mmol/L (ref 3.5–5.1)
Sodium: 131 mmol/L — ABNORMAL LOW (ref 135–145)

## 2024-11-04 LAB — IRON AND TIBC
Iron: <10 ug/dL — ABNORMAL LOW (ref 28–170)
TIBC: 175 ug/dL — ABNORMAL LOW (ref 250–450)

## 2024-11-04 LAB — RETICULOCYTES
Immature Retic Fract: 13.2 % (ref 2.3–15.9)
RBC.: 4.06 MIL/uL (ref 3.87–5.11)
Retic Count, Absolute: 43.4 K/uL (ref 19.0–186.0)
Retic Ct Pct: 1.1 % (ref 0.4–3.1)

## 2024-11-04 LAB — DIRECT ANTIGLOBULIN TEST (NOT AT ARMC)
DAT, IgG: NEGATIVE
DAT, complement: NEGATIVE

## 2024-11-04 LAB — CBC
HCT: 39.6 % (ref 36.0–46.0)
Hemoglobin: 13.5 g/dL (ref 12.0–15.0)
MCH: 35 pg — ABNORMAL HIGH (ref 26.0–34.0)
MCHC: 34.1 g/dL (ref 30.0–36.0)
MCV: 102.6 fL — ABNORMAL HIGH (ref 80.0–100.0)
Platelets: 210 K/uL (ref 150–400)
RBC: 3.86 MIL/uL — ABNORMAL LOW (ref 3.87–5.11)
RDW: 14.9 % (ref 11.5–15.5)
WBC: 14 K/uL — ABNORMAL HIGH (ref 4.0–10.5)
nRBC: 0.1 % (ref 0.0–0.2)

## 2024-11-04 LAB — VITAMIN B12: Vitamin B-12: >4000 pg/mL — ABNORMAL HIGH (ref 180–914)

## 2024-11-04 LAB — GLUCOSE, CAPILLARY
Glucose-Capillary: 72 mg/dL (ref 70–99)
Glucose-Capillary: 64 mg/dL — ABNORMAL LOW (ref 70–99)
Glucose-Capillary: 122 mg/dL — ABNORMAL HIGH (ref 70–99)

## 2024-11-04 LAB — FOLATE: Folate: 19.5 ng/mL (ref >5.9)

## 2024-11-04 LAB — HEMOGLOBIN AND HEMATOCRIT, BLOOD
HCT: 40.9 % (ref 36.0–46.0)
Hemoglobin: 14 g/dL (ref 12.0–15.0)

## 2024-11-04 LAB — PROCALCITONIN: Procalcitonin: 13 ng/mL

## 2024-11-04 LAB — FIBRINOGEN: Fibrinogen: >800 mg/dL — ABNORMAL HIGH (ref 210–475)

## 2024-11-04 MED ORDER — REVEFENACIN 175 MCG/3ML IN SOLN
175.0000 ug | Freq: Every day | RESPIRATORY_TRACT | Status: DC
  Administered 2024-11-04 – 2024-11-14 (×11): 175 ug via RESPIRATORY_TRACT
  Filled 2024-11-04 (×13): qty 3

## 2024-11-04 MED ORDER — SODIUM CHLORIDE 0.9 % IV SOLN
2.0000 g | INTRAVENOUS | Status: DC
  Administered 2024-11-04 – 2024-11-10 (×7): 2 g via INTRAVENOUS
  Filled 2024-11-04 (×7): qty 20

## 2024-11-04 MED ORDER — METHYLPREDNISOLONE SODIUM SUCC 40 MG IJ SOLR
40.0000 mg | Freq: Every day | INTRAMUSCULAR | Status: DC
  Administered 2024-11-04 – 2024-11-05 (×2): 40 mg via INTRAVENOUS
  Filled 2024-11-04 (×2): qty 1

## 2024-11-04 MED ORDER — RALOXIFENE HCL 60 MG PO TABS
60.0000 mg | ORAL_TABLET | Freq: Every day | ORAL | Status: DC
  Administered 2024-11-04 – 2024-11-14 (×11): 60 mg via ORAL
  Filled 2024-11-04 (×12): qty 1

## 2024-11-04 MED ORDER — ENSURE PLUS HIGH PROTEIN PO LIQD
237.0000 mL | Freq: Two times a day (BID) | ORAL | Status: DC
  Administered 2024-11-05 – 2024-11-07 (×5): 237 mL via ORAL

## 2024-11-04 MED ORDER — DEXTROSE 50 % IV SOLN
12.5000 g | Freq: Once | INTRAVENOUS | Status: AC
  Administered 2024-11-04: 12.5 g via INTRAVENOUS
  Filled 2024-11-04: qty 50

## 2024-11-04 MED ORDER — IVABRADINE HCL 5 MG PO TABS: 5.0000 mg | ORAL_TABLET | Freq: Two times a day (BID) | ORAL | Status: DC

## 2024-11-04 MED ORDER — ALBUTEROL SULFATE (2.5 MG/3ML) 0.083% IN NEBU
2.5000 mg | INHALATION_SOLUTION | Freq: Four times a day (QID) | RESPIRATORY_TRACT | Status: DC
  Administered 2024-11-04 – 2024-11-05 (×4): 2.5 mg via RESPIRATORY_TRACT
  Filled 2024-11-04 (×4): qty 3

## 2024-11-04 MED ORDER — BUDESONIDE 0.25 MG/2ML IN SUSP
0.2500 mg | Freq: Two times a day (BID) | RESPIRATORY_TRACT | Status: DC
  Administered 2024-11-04 – 2024-11-14 (×20): 0.25 mg via RESPIRATORY_TRACT
  Filled 2024-11-04 (×23): qty 2

## 2024-11-04 MED ORDER — PANTOPRAZOLE SODIUM 40 MG PO TBEC
40.0000 mg | DELAYED_RELEASE_TABLET | Freq: Two times a day (BID) | ORAL | Status: DC
  Administered 2024-11-04 – 2024-11-14 (×20): 40 mg via ORAL
  Filled 2024-11-04 (×22): qty 1

## 2024-11-04 MED ORDER — ARFORMOTEROL TARTRATE 15 MCG/2ML IN NEBU
15.0000 ug | INHALATION_SOLUTION | Freq: Two times a day (BID) | RESPIRATORY_TRACT | Status: DC
  Administered 2024-11-04 – 2024-11-14 (×20): 15 ug via RESPIRATORY_TRACT
  Filled 2024-11-04 (×24): qty 2

## 2024-11-04 MED ORDER — AZITHROMYCIN 250 MG PO TABS
500.0000 mg | ORAL_TABLET | Freq: Every day | ORAL | Status: DC
  Administered 2024-11-04 – 2024-11-10 (×7): 500 mg via ORAL
  Filled 2024-11-04 (×7): qty 2

## 2024-11-04 MED ORDER — GUAIFENESIN-DM 100-10 MG/5ML PO SYRP
5.0000 mL | ORAL_SOLUTION | ORAL | Status: DC | PRN
  Administered 2024-11-05: 5 mL via ORAL
  Filled 2024-11-04: qty 5

## 2024-11-04 NOTE — Progress Notes (Signed)
°   11/04/24 0843  TOC Brief Assessment  Insurance and Status Reviewed  Patient has primary care physician Yes  Home environment has been reviewed single family home  Prior level of function: independent  Prior/Current Home Services No current home services  Social Drivers of Health Review SDOH reviewed no interventions necessary  Readmission risk has been reviewed Yes  Transition of care needs transition of care needs identified, TOC will continue to follow    Pt admitted for acute hypoxic respiratory failure and community-acquired pneumonia. Pt currently on 6L HFNC. Pt resides at home alone with assistance from granddaughters. TOC will continue to follow.  Signed: Heather Saltness, MSW, LCSW Clinical Social Worker Inpatient Care Management 11/04/2024 8:45 AM

## 2024-11-04 NOTE — Progress Notes (Signed)
 SLP swallow evaluation completed.  Full report to follow.  Will advance diet to regular/thin with pt and her granddaughter ordering her foods that she can more easily manage to help improve food options.  She plans to follow up with ENT who plans to scope her during their next visit.   Madelin POUR, MS [facility] SLP Acute [employer] [phone]

## 2024-11-04 NOTE — Consult Note (Signed)
 Referring Provider: TH Primary Care Physician:  Arloa Elsie SAUNDERS, MD Primary Gastroenterologist: [facility]  Reason for Consultation: Possible upper GI bleed  HPI: Angi  Carol [name] is Carol 88 y.o. female with past medical history of CHF, hypertension and GERD brought into the hospital with confusion and cough.  Was diagnosed with acute hypoxic respiratory failure and community-acquired pneumonia currently on 4 L of oxygen .  There was concern for some dark black material on her face concerning for upper GI bleed and GI is consulted for further evaluation. Blood work this morning showed normal hemoglobin of 13.5, mildly elevated white count.  Occult blood negative.  Normal INR.  Normal LFTs.  Normal lipase.  CT abdomen pelvis with IV contrast showed no acute changes.  Patient seen and examined at bedside.  She is alert and oriented now.  According to patient, she was eating chocolate cake when she started having confusion and the dark material around her mouth was likely chocolate cake remanence and not the vomiting.  Patient denied any nausea or vomiting.  Denies seeing blood in the stool or black stool.  Denies any abdominal pain.  EGD in June 2024 for evaluation of epigastric abdominal pain showed gastritis.  Biopsies were negative for H. pylori.    Past Medical History:  Diagnosis Date   Anxiety    Arthritis    arthritis -hips   Colitis    Complication of anesthesia    B/P dropped post colonoscopy x1   GERD (gastroesophageal reflux disease)    Headache(784.0)    migraines rare occ.   Hypertension    Urinary tract infection early aug 2014    Past Surgical History:  Procedure Laterality Date   BIOPSY  05/21/2023   Procedure: BIOPSY;  Surgeon: Dianna Specking, MD;  Location: [facility];  Service: Gastroenterology;;   CATARACT EXTRACTION, BILATERAL Bilateral    COLONOSCOPY WITH PROPOFOL  N/Carol 08/03/2013   Procedure: COLONOSCOPY WITH PROPOFOL ;  Surgeon: Lynwood LITTIE Celestia Mickey., MD;   Location: [facility];  Service: Endoscopy;  Laterality: N/Carol;  ultra thin colon scope   COLONOSCOPY WITH PROPOFOL  N/Carol 02/04/2018   Procedure: COLONOSCOPY WITH PROPOFOL ;  Surgeon: Celestia Lynwood, MD;  Location: [facility];  Service: Endoscopy;  Laterality: N/Carol;   ESOPHAGOGASTRODUODENOSCOPY (EGD) WITH PROPOFOL  N/Carol 05/21/2023   Procedure: ESOPHAGOGASTRODUODENOSCOPY (EGD) WITH PROPOFOL ;  Surgeon: Dianna Specking, MD;  Location: [facility];  Service: Gastroenterology;  Laterality: N/Carol;   LASIK      Prior to Admission medications   Medication Sig Start Date End Date Taking? Authorizing Provider  acetaminophen  (TYLENOL  8 HOUR ARTHRITIS PAIN) 650 MG CR tablet Take 650-1,300 mg by mouth every 8 (eight) hours as needed for pain. 02/01/09  Yes [provider]  aspirin  81 MG chewable tablet Chew 1 tablet (81 mg total) by mouth daily with lunch. 07/03/23  Yes Patwardhan, Manish J, MD  chlorpheniramine (CHLOR-TRIMETON) 4 MG tablet Take 4 mg by mouth daily as needed for allergies or rhinitis.   Yes [provider]  empagliflozin  (JARDIANCE ) 10 MG TABS tablet Take 1 tablet (10 mg total) by mouth daily. 04/17/24  Yes Patwardhan, Manish J, MD  famotidine  (PEPCID ) 10 MG tablet Take 20 mg by mouth daily as needed for heartburn or indigestion.   Yes [provider]  Fluticasone -Umeclidin-Vilant (TRELEGY ELLIPTA ) 100-62.5-25 MCG/ACT AEPB Inhale 1 puff into the lungs daily. 12/18/23  Yes Alva, Rakesh V, MD  ipratropium (ATROVENT ) 0.03 % nasal spray Place 2 sprays into both nostrils 3 (three) times daily. Patient taking  differently: Place 2 sprays into both nostrils 3 (three) times daily as needed for rhinitis. 06/10/23  Yes Cobb, Comer GAILS, NP  ivabradine  (CORLANOR ) 5 MG TABS tablet TAKE 1 TABLET TWICE Carol DAY WITH MEALS 09/10/24  Yes Patwardhan, Manish J, MD  loratadine (CLARITIN) 10 MG tablet Take 10 mg by mouth daily.   Yes [provider]  LORazepam  (ATIVAN ) 1 MG tablet Take 1  tablet (1 mg total) by mouth daily at 8 pm. Patient taking differently: Take 1 mg by mouth at bedtime. 09/12/22  Yes Arrien, Elidia Sieving, MD  losartan  (COZAAR ) 25 MG tablet TAKE 1 TABLET EVERY EVENING 07/23/24  Yes Patwardhan, Manish J, MD  metoprolol  succinate (TOPROL -XL) 50 MG 24 hr tablet TAKE 1 TABLET IN THE MORNING 07/23/24  Yes Patwardhan, Manish J, MD  mirtazapine  (REMERON ) 30 MG tablet Take 30 mg by mouth at bedtime.   Yes [provider]  montelukast  (SINGULAIR ) 10 MG tablet Take 10 mg by mouth at bedtime as needed (for allergies). 06/26/24  Yes [provider]  Multiple Vitamins-Minerals (MULTI VITAMIN/MINERALS) TABS Take 1 tablet by mouth daily with breakfast.   Yes [provider]  OXYGEN  Inhale 2 L/min into the lungs as needed (for shortness of breath).   Yes [provider]  pantoprazole  (PROTONIX ) 40 MG tablet Take 40 mg by mouth 2 (two) times daily before Carol meal. 05/23/22  Yes [provider]  Polyethyl Glycol-Propyl Glycol (SYSTANE OP) Place 1 drop into both eyes 3 (three) times daily as needed (for dryness).   Yes [provider]  polyethylene glycol (MIRALAX  / GLYCOLAX ) 17 g packet Take 17 g by mouth daily. Patient taking differently: Take 4.25-8.5 g by mouth daily with breakfast. 09/15/22  Yes Arrien, Elidia Sieving, MD  raloxifene  (EVISTA ) 60 MG tablet Take 60 mg by mouth daily. 12/30/17  Yes [provider]  simvastatin  (ZOCOR ) 20 MG tablet TAKE 1 TABLET AT BEDTIME 08/05/24  Yes Patwardhan, Manish J, MD  sodium chloride  (OCEAN) 0.65 % SOLN nasal spray Place 1 spray into both nostrils as needed for congestion.   Yes [provider]  spironolactone  (ALDACTONE ) 25 MG tablet Take 0.5 tablets (12.5 mg total) by mouth daily. 10/29/24  Yes Patwardhan, Manish J, MD  triamcinolone (NASACORT ALLERGY 24HR) 55 MCG/ACT AERO nasal inhaler Place 2 sprays into the nose in the morning.   Yes [provider]   triamcinolone ointment (KENALOG) 0.5 % Apply 1 Application topically 2 (two) times daily as needed (for itching). 05/22/19  Yes [provider]  venlafaxine  XR (EFFEXOR -XR) 150 MG 24 hr capsule Take 150 mg by mouth daily with breakfast. 01/30/23  Yes [provider]    Scheduled Meds:  budesonide -glycopyrrolate -formoterol   2 puff Inhalation BID   methylPREDNISolone  (SOLU-MEDROL ) injection  40 mg Intravenous Daily   metoprolol  succinate  50 mg Oral q morning   mirtazapine   30 mg Oral QHS   montelukast   10 mg Oral Daily   pantoprazole  (PROTONIX ) IV  40 mg Intravenous Q12H   simvastatin   20 mg Oral QHS   venlafaxine  XR  150 mg Oral Q breakfast   Continuous Infusions:  azithromycin      cefTRIAXone  (ROCEPHIN )  IV     PRN Meds:.acetaminophen  **OR** acetaminophen , albuterol , ipratropium, ondansetron  **OR** ondansetron  (ZOFRAN ) IV, traZODone   Allergies as of 11/03/2024 - Review Complete 11/03/2024  Allergen Reaction Noted   Amoxicillin  Other (See Comments) 04/09/2018   Amoxicillin -pot clavulanate Other (See Comments) 11/03/2024   Ciprofloxacin  Nausea Only and Other (  See Comments) 07/22/2013   Metronidazole  Other (See Comments) 11/03/2024   Nitrofuran derivatives Nausea Only and Other (See Comments) 07/22/2013   Paroxetine Nausea Only and Other (See Comments) 09/06/2022   Penicillins Nausea Only and Other (See Comments) 09/03/2022   Sulfa antibiotics Anxiety and Other (See Comments) 07/03/2013   Sulfamethoxazole Anxiety and Other (See Comments) 04/09/2018    History reviewed. No pertinent family history.  Social History   Socioeconomic History   Marital status: Widowed    Spouse name: Not on file   Number of children: Not on file   Years of education: Not on file   Highest education level: Not on file  Occupational History   Not on file  Tobacco Use   Smoking status: Former    Current packs/day: 10.00    Average packs/day: 0.4 packs/day for 40.7 years (16.9 ttl  pk-yrs)    Types: Cigarettes    Start date: 09/05/1982    Quit date: 09/05/2022   Smokeless tobacco: Never  Vaping Use   Vaping status: Never Used  Substance and Sexual Activity   Alcohol use: No   Drug use: No   Sexual activity: Not Currently  Other Topics Concern   Not on file  Social History Narrative   Not on file   Social Drivers of Health   Financial Resource Strain: Not on file  Food Insecurity: Not on file  Transportation Needs: Not on file  Physical Activity: Not on file  Stress: Not on file  Social Connections: Not on file  Intimate Partner Violence: Not on file    Review of Systems: All negative except as stated above in HPI.  Physical Exam: Vital signs: Vitals:   11/03/24 1902 11/04/24 0335  BP: 101/60 124/60  Pulse: 77 89  Resp: 15   Temp: 98.2 F (36.8 C) 98.7 F (37.1 C)  SpO2:  97%   Last BM Date : 11/02/24 General:   Elderly patient, not in acute distress Lungs: Decreased breath sounds bilaterally  Heart:  Regular rate and rhythm; no murmurs, clicks, rubs,  or gallops. Abdomen: Soft, nontender, nondistended, bowel sound present, no peritoneal signs Mood and affect normal Alert and oriented x 3 Rectal:  Deferred  GI:  Lab Results: Recent Labs    11/03/24 1220 11/03/24 1910 11/04/24 0037 11/04/24 0831  WBC 13.8*  --  14.0*  --   HGB 15.1* 14.4 13.5 14.0  HCT 44.7 43.3 39.6 40.9  PLT 231  --  210  --    BMET Recent Labs    11/03/24 1220 11/04/24 0037  NA 130* 131*  K 5.0 4.8  CL 94* 98  CO2 22 19*  GLUCOSE 112* 69*  BUN 39* 41*  CREATININE 1.49* 1.33*  CALCIUM  9.4 8.2*   LFT Recent Labs    11/03/24 1220  PROT 7.3  ALBUMIN 3.5  AST 28  ALT 15  ALKPHOS 60  BILITOT 0.6   PT/INR Recent Labs    11/03/24 1220  LABPROT 15.4*  INR 1.2     Studies/Results: CT ABDOMEN PELVIS W CONTRAST Result Date: 11/03/2024 CLINICAL DATA:  Abdominal pain, vomiting, weakness, left lower abdominal tenderness EXAM: CT ABDOMEN AND  PELVIS WITH CONTRAST TECHNIQUE: Multidetector CT imaging of the abdomen and pelvis was performed using the standard protocol following bolus administration of intravenous contrast. RADIATION DOSE REDUCTION: This exam was performed according to the departmental dose-optimization program which includes automated exposure control, adjustment of the mA and/or kV according to patient size and/or use of  iterative reconstruction technique. CONTRAST:  60mL OMNIPAQUE  IOHEXOL  300 MG/ML  SOLN COMPARISON:  11/03/2024, 04/27/2022 FINDINGS: Lower chest: Airspace disease throughout the right middle and right lower lobes consistent with infection or aspiration. Background emphysema. Hepatobiliary: No focal liver abnormality is seen. No gallstones, gallbladder wall thickening, or biliary dilatation. Pancreas: Unremarkable. No pancreatic ductal dilatation or surrounding inflammatory changes. Spleen: Normal in size without focal abnormality. Adrenals/Urinary Tract: Nonspecific bilateral adrenal thickening measuring 9 mm on the right and 8 mm on the left. The kidneys enhance normally and symmetrically. No urinary tract calculi or obstructive uropathy. Bladder is unremarkable. Stomach/Bowel: No bowel obstruction or ileus. Normal appendix right lower quadrant. Diffuse diverticulosis most pronounced within the sigmoid colon, with no evidence of acute diverticulitis. No bowel wall thickening or inflammatory change. There is Carol persistent 2.4 cm hyperdense structure within the distal sigmoid colon, reference image 57/2, which may reflect Carol fecalith or barium filled diverticulum. Vascular/Lymphatic: Diffuse atherosclerosis of the aorta and its branches. No pathologic adenopathy. Reproductive: Uterus and bilateral adnexa are unremarkable. Other: No free fluid or free intraperitoneal gas. No abdominal wall hernia. Musculoskeletal: No acute or destructive bony abnormalities. The bones are osteopenic. Stable degenerative anterolisthesis of L4 on  L5. Reconstructed images demonstrate no additional findings. IMPRESSION: 1. Right middle and right lower lobe pneumonia. 2. No acute intra-abdominal or intrapelvic process. 3. Colonic diverticulosis without diverticulitis. 4. Aortic Atherosclerosis (ICD10-I70.0) and Emphysema (ICD10-J43.9). Electronically Signed   By: Ozell Daring M.D.   On: 11/03/2024 15:36   DG Chest Port 1 View Result Date: 11/03/2024 CLINICAL DATA:  Weakness.  Cough. EXAM: PORTABLE CHEST 1 VIEW COMPARISON:  Chest radiograph dated 09/06/2022. CT chest dated 02/12/2022. FINDINGS: Mildly prominent cardiac silhouette. Aortic atherosclerosis. Small layering right pleural effusion with right basilar opacity. Background chronic interstitial coarsening, compatible with history of emphysema. No pneumothorax. No acute osseous abnormality. IMPRESSION: 1. Small layering right pleural effusion with right basilar airspace disease. 2. Emphysema. Electronically Signed   By: Harrietta Sherry M.D.   On: 11/03/2024 14:11    Impression/Plan: -Concern for upper GI bleed in Carol patient who presented with confusion and was found to have some dark material around her face.  Normal hemoglobin.  FOBT negative.  CT scan negative for any acute changes.  EGD in June 2024 showed mild gastritis and biopsies were negative for H. pylori. - Acute hypoxic respiratory failure with community-acquired pneumonia  Recommendations -------------------------- - Patient denies any nausea or vomiting.  According to her, dark material around her face is likely chocolate cake that she was eating.  Denies seeing any blood in the stool or black stool.  Hemoglobin is stable. - No plan for inpatient endoscopic workup at this time particularly in setting of respiratory failure and pneumonia. -Start soft diet and advance as tolerated. - GI will sign off.  Call us  back if needed.     LOS: 1 day   Layla Lah  MD, FACP 11/04/2024, 8:54 AM  Contact #  [phone]

## 2024-11-04 NOTE — Progress Notes (Signed)
 RN called to have the Pt's O2 checked. Her SATS were 82% on 6L. RT and nurse changed the pulse ox and asked the Pt to breath through her nose and her SATS on 7L 92%

## 2024-11-04 NOTE — Plan of Care (Signed)
  Problem: Clinical Measurements: Goal: Respiratory complications will improve Outcome: Progressing Goal: Cardiovascular complication will be avoided Outcome: Progressing   Problem: Coping: Goal: Level of anxiety will decrease Outcome: Progressing   Problem: Elimination: Goal: Will not experience complications related to urinary retention Outcome: Progressing   Problem: Pain Managment: Goal: General experience of comfort will improve and/or be controlled Outcome: Progressing   Problem: Safety: Goal: Ability to remain free from injury will improve Outcome: Progressing

## 2024-11-04 NOTE — Progress Notes (Signed)
 [facility] Progress Note Patient: Carol  [name] FMW:[id number] DOB: 13-Sep-1936  DOA: 11/03/2024 DOS: the patient was seen and examined on 11/04/2024  Brief Hospital Course: Patient with PMH of chronic combined CHF, HTN, HLD, GERD, mood disorder presented to the hospital with complaints of cough and confusion. Patient lives alone.  Since last 1 week has congestion and cold.  Progressively becoming confused since last was 7. Upon admission was found to have severe sepsis due to pneumonia. Receiving IV antibiotics. Assessment and Plan: Sepsis secondary to community-acquired pneumonia with acute hypoxic respiratory failure. Acute COPD exacerbation Does not use oxygen  at her baseline. Met SIRS criteria on admission with tachypnea, leukocytosis and hypoxia. Mild AKI was also seen. Currently on IV antibiotic. Respiratory virus pathogen panel is negative. Procalcitonin is significantly elevated. Aggressively treating hypoxic respiratory failure with steroids, and nebulized therapy as well as inhalers. SLP consulted.  No evidence of aspiration.  Concern for upper GI bleed-ruled out. Patient had an episode of dark vomiting. No prior history of GI bleed. On daily aspirin . Hemoglobin relatively stable. GI was consulted although patient reports to GI that the patient had some dark-colored cake which she vomited which was thought to be possible blood. For now we will continue with PPI. Monitor for now.  Chronic combined systolic and diastolic CHF. CAD HTN EF 40%. Grade 1 diastolic dysfunction. Currently no evidence of volume overload. For now we will monitor. Holding home antihypertensive regimen losartan , Aldactone , Jardiance , metoprolol  and ivabradine . Holding aspirin  due to concern for GI bleed.  Telemetry resume tomorrow. Hold statin for now.  Acute kidney injury. Baseline serum creatinine 0.8. Serum creatinine upon admission 1.49. Improving for now. Will monitor.  Iron  deficiency anemia. Macrocytosis. Iron level undetectable. B12 folic acid  normal. Initiate oral iron.  Monitor.  Abdomen pain with vomiting. CT abdomen pelvis with contrast performed in the ED. Shows pneumonia but no acute intra-abdominal pathology. Some constipation seen. Monitor.  Adult failure to thrive.  Likely related to esophageal dysmotility. Body mass index is 18.55 kg/m.  Placing the patient at high risk of poor outcome.  Subjective: Has some cough and shortness of breath.  No other acute complaint.  No nausea no vomiting.  Physical Exam: Significantly weak.  And lethargic. Bilateral basal crackles.  Bilateral expiratory wheezes. Increase respiratory distress. Bowel sound present.  Nontender. No edema.  Data Reviewed: I have Reviewed nursing notes, Vitals, and Lab results. Since last encounter, pertinent lab results CBC BMP   . I have ordered test including CBC and BMP  . I have discussed pt's care plan and test results with GI  .   Disposition: Status is: Inpatient Remains inpatient appropriate because: Monitor for improvement and respiratory status  Family Communication: No at bedside Level of care: Telemetry   Vitals:   11/04/24 0335 11/04/24 1246 11/04/24 1518 11/04/24 1519  BP: 124/60 (!) 115/55    Pulse: 89 84 90   Resp:  19    Temp: 98.7 F (37.1 C) 98 F (36.7 C)    TempSrc: Oral     SpO2: 97%  (!) 82% 92%  Weight:      Height:         Author: Yetta Blanch, MD 11/04/2024 4:30 PM  Please look on [email or web address] to find out who is on call.

## 2024-11-04 NOTE — Evaluation (Signed)
 Occupational Therapy Evaluation Patient Details Name: Carol  [name] MRN: [id number] DOB: [birth year] Today's Date: 11/04/2024   History of Present Illness   Pt is a 88 y.o. female who presented due to cough and confusion. Pt was admitted due to hypoxic respiratory failure and community PNA. PMH combined systolic and diastolic congestive heart failure, hypertension, GERD     Clinical Impressions Pt reported at PLOF they live alone but has family that comes through daily but can not stay all day. Pt reports they were not using any DME and family assist with getting groceries/meds as needed. At this time pt limited due to oxygen  status but also difficult to get a good pleth on the pt. Pt presented on 6L at the start of session and ranged from 88-91% and then when sitting at EOB was in the 80s% even with cued on pursed lip breathing techniques and o2 was brought up to 7L with o2 at 90-91%. Pt then completed step pivot transfer to chair with HHA and line management and o2 at 7L was initial 85% but with seated rest came back up to 91%. Nursing was made aware of o2 status. Patient will benefit from continued inpatient follow up therapy, <3 hours/day. However, they may be able to progress to Home Health.      If plan is discharge home, recommend the following:   A little help with walking and/or transfers;A little help with bathing/dressing/bathroom;Assistance with cooking/housework;Assist for transportation     Functional Status Assessment   Patient has had a recent decline in their functional status and demonstrates the ability to make significant improvements in function in a reasonable and predictable amount of time.     Equipment Recommendations   Tub/shower seat (4WW vs RW but pt not sure what walker they have at home)     Recommendations for Other Services         Precautions/Restrictions   Precautions Precautions: None Recall of Precautions/Restrictions:  Intact Precaution/Restrictions Comments: watch o2 Restrictions Weight Bearing Restrictions Per Provider Order: No     Mobility Bed Mobility Overal bed mobility: Needs Assistance Bed Mobility: Supine to Sit     Supine to sit: Modified independent (Device/Increase time)     General bed mobility comments: increase in time and cued on pacing    Transfers Overall transfer level: Needs assistance   Transfers: Sit to/from Stand Sit to Stand: Contact guard assist           General transfer comment: HHA      Balance Overall balance assessment: Needs assistance Sitting-balance support: Feet supported Sitting balance-Leahy Scale: Good     Standing balance support: Single extremity supported Standing balance-Leahy Scale: Fair                             ADL either performed or assessed with clinical judgement   ADL Overall ADL's : Needs assistance/impaired Eating/Feeding: Independent;Sitting   Grooming: Wash/dry face;Set up;Supervision/safety;Sitting   Upper Body Bathing: Set up;Supervision/ safety;Sitting   Lower Body Bathing: Contact guard assist   Upper Body Dressing : Set up;Supervision/safety   Lower Body Dressing: Contact guard assist;Sit to/from stand   Toilet Transfer: Minimal assistance Toilet Transfer Details (indicate cue type and reason): HHA         Functional mobility during ADLs: Minimal assistance General ADL Comments: HHA     Vision Baseline Vision/History: 1 Wears glasses Ability to See in Adequate Light: 0 Adequate Patient Visual Report: No  change from baseline Vision Assessment?: Wears glasses for reading     Perception Perception: Within Functional Limits       Praxis Praxis: [facility]       Pertinent Vitals/Pain Pain Assessment Pain Assessment: No/denies pain     Extremity/Trunk Assessment Upper Extremity Assessment Upper Extremity Assessment: Generalized weakness   Lower Extremity Assessment Lower Extremity  Assessment: Defer to PT evaluation       Communication Communication Communication: Impaired Factors Affecting Communication: Reduced clarity of speech   Cognition Arousal: Alert Behavior During Therapy: WFL for tasks assessed/performed               OT - Cognition Comments: Pt noted to need a little time to self correct if given wrong answer.                 Following commands: Intact       Cueing  General Comments   Cueing Techniques: Verbal cues      Exercises     Shoulder Instructions      Home Living Family/patient expects to be discharged to:: Private residence Living Arrangements: Alone Available Help at Discharge: Family;Available PRN/intermittently Type of Home: House Home Access: Stairs to enter [employer] of Steps: 2 Entrance Stairs-Rails: Right Home Layout: One level     Bathroom Shower/Tub: Runner, Broadcasting/film/video:  (Pt reported they think they have a walker but was not sure)   Additional Comments: Pt's family comes in daily and assist with getting groceries/meds but can not stay throughout the day and she can not live with them      Prior Functioning/Environment Prior Level of Function : Independent/Modified Independent             Mobility Comments: indep ADLs Comments: indep    OT Problem List: Impaired balance (sitting and/or standing);Decreased safety awareness;Decreased knowledge of use of DME or AE   OT Treatment/Interventions: Self-care/ADL training;Therapeutic exercise;DME and/or AE instruction;Therapeutic activities;Patient/family education;Balance training      OT Goals(Current goals can be found in the care plan section)   Acute Rehab OT Goals Patient Stated Goal: to breath better OT Goal Formulation: With patient Time For Goal Achievement: 11/18/24 Potential to Achieve Goals: Good   OT Frequency:  Min 2X/week    Co-evaluation              AM-PAC OT 6 Clicks Daily  Activity     Outcome Measure Help from another person eating meals?: None Help from another person taking care of personal grooming?: None Help from another person toileting, which includes using toliet, bedpan, or urinal?: A Little Help from another person bathing (including washing, rinsing, drying)?: A Little Help from another person to put on and taking off regular upper body clothing?: None Help from another person to put on and taking off regular lower body clothing?: A Little 6 Click Score: 21   End of Session Equipment Utilized During Treatment: Gait belt Nurse Communication: Mobility status (o2)  Activity Tolerance: Patient tolerated treatment well Patient left: in chair;with call bell/phone within reach;with chair alarm set  OT Visit Diagnosis: Unsteadiness on feet (R26.81);Muscle weakness (generalized) (M62.81)                Time: 8949-8888 OT Time Calculation (min): 21 min Charges:  OT General Charges $OT Visit: 1 Visit OT Evaluation $OT Eval Low Complexity: 1 Low  Warrick POUR OTR/L  Acute Rehab Services  [phone] office number  Warrick Berber 11/04/2024, 11:21 AM

## 2024-11-05 ENCOUNTER — Other Ambulatory Visit (HOSPITAL_COMMUNITY): Payer: Self-pay

## 2024-11-05 LAB — BASIC METABOLIC PANEL WITH GFR
Anion gap: 11 (ref 5–15)
BUN: 40 mg/dL — ABNORMAL HIGH (ref 8–23)
CO2: 24 mmol/L (ref 22–32)
Calcium: 8.7 mg/dL — ABNORMAL LOW (ref 8.9–10.3)
Chloride: 100 mmol/L (ref 98–111)
Creatinine, Ser: 1.06 mg/dL — ABNORMAL HIGH (ref 0.44–1.00)
GFR, Estimated: 51 mL/min — ABNORMAL LOW (ref >60)
Glucose, Bld: 110 mg/dL — ABNORMAL HIGH (ref 70–99)
Potassium: 4.7 mmol/L (ref 3.5–5.1)
Sodium: 136 mmol/L (ref 135–145)

## 2024-11-05 LAB — CBC
HCT: 39.3 % (ref 36.0–46.0)
Hemoglobin: 13.1 g/dL (ref 12.0–15.0)
MCH: 34.1 pg — ABNORMAL HIGH (ref 26.0–34.0)
MCHC: 33.3 g/dL (ref 30.0–36.0)
MCV: 102.3 fL — ABNORMAL HIGH (ref 80.0–100.0)
Platelets: 228 K/uL (ref 150–400)
RBC: 3.84 MIL/uL — ABNORMAL LOW (ref 3.87–5.11)
RDW: 15.1 % (ref 11.5–15.5)
WBC: 12.3 K/uL — ABNORMAL HIGH (ref 4.0–10.5)
nRBC: 0.4 % — ABNORMAL HIGH (ref 0.0–0.2)

## 2024-11-05 LAB — MAGNESIUM: Magnesium: 3.1 mg/dL — ABNORMAL HIGH (ref 1.7–2.4)

## 2024-11-05 MED ORDER — ENOXAPARIN SODIUM 30 MG/0.3ML IJ SOSY: 30.0000 mg | PREFILLED_SYRINGE | INTRAMUSCULAR | Status: DC

## 2024-11-05 MED ORDER — METHYLPREDNISOLONE SODIUM SUCC 40 MG IJ SOLR
40.0000 mg | Freq: Two times a day (BID) | INTRAMUSCULAR | Status: DC
  Administered 2024-11-05 – 2024-11-07 (×4): 40 mg via INTRAVENOUS
  Filled 2024-11-05 (×4): qty 1

## 2024-11-05 MED ORDER — BENZONATATE 100 MG PO CAPS
100.0000 mg | ORAL_CAPSULE | Freq: Three times a day (TID) | ORAL | Status: DC | PRN
  Administered 2024-11-05 – 2024-11-07 (×2): 100 mg via ORAL
  Filled 2024-11-05 (×3): qty 1

## 2024-11-05 MED ORDER — GUAIFENESIN 100 MG/5ML PO LIQD
15.0000 mL | Freq: Four times a day (QID) | ORAL | Status: DC
  Administered 2024-11-05 – 2024-11-12 (×25): 15 mL via ORAL
  Filled 2024-11-05: qty 20
  Filled 2024-11-05: qty 15
  Filled 2024-11-05: qty 20
  Filled 2024-11-05: qty 15
  Filled 2024-11-05 (×7): qty 20
  Filled 2024-11-05 (×2): qty 15
  Filled 2024-11-05 (×2): qty 20
  Filled 2024-11-05 (×2): qty 15
  Filled 2024-11-05 (×8): qty 20
  Filled 2024-11-05 (×2): qty 15
  Filled 2024-11-05 (×4): qty 20
  Filled 2024-11-05 (×2): qty 15
  Filled 2024-11-05: qty 20
  Filled 2024-11-05: qty 15

## 2024-11-05 MED ORDER — SALINE SPRAY 0.65 % NA SOLN
1.0000 | NASAL | Status: DC | PRN
  Filled 2024-11-05 (×2): qty 44

## 2024-11-05 MED ORDER — ENOXAPARIN SODIUM 30 MG/0.3ML IJ SOSY
30.0000 mg | PREFILLED_SYRINGE | INTRAMUSCULAR | Status: DC
  Administered 2024-11-05 – 2024-11-11 (×7): 30 mg via SUBCUTANEOUS
  Filled 2024-11-05 (×7): qty 0.3

## 2024-11-05 MED ORDER — IPRATROPIUM-ALBUTEROL 0.5-2.5 (3) MG/3ML IN SOLN
3.0000 mL | Freq: Four times a day (QID) | RESPIRATORY_TRACT | Status: DC
  Administered 2024-11-05 – 2024-11-06 (×5): 3 mL via RESPIRATORY_TRACT
  Filled 2024-11-05 (×5): qty 3

## 2024-11-05 MED ORDER — DEXTROMETHORPHAN POLISTIREX ER 30 MG/5ML PO SUER
30.0000 mg | Freq: Two times a day (BID) | ORAL | Status: DC
  Administered 2024-11-05 – 2024-11-12 (×13): 30 mg via ORAL
  Filled 2024-11-05 (×22): qty 5

## 2024-11-05 NOTE — Plan of Care (Signed)
   Problem: Activity: Goal: Risk for activity intolerance will decrease Outcome: Not Progressing

## 2024-11-05 NOTE — Hospital Course (Signed)
 Patient with PMH of chronic combined CHF, HTN, HLD, GERD, mood disorder presented to the hospital with complaints of cough and confusion. Patient lives alone.  Since last 1 week has congestion and cold.  Progressively becoming confused since last was 7. Upon admission was found to have severe sepsis due to pneumonia. Receiving IV antibiotics. Assessment and Plan: Sepsis secondary to community-acquired pneumonia with acute hypoxic respiratory failure. Acute COPD exacerbation Does not use oxygen  at her baseline. Met SIRS criteria on admission with tachypnea, leukocytosis and hypoxia. Mild AKI was also seen. Currently on IV antibiotic. Respiratory virus pathogen panel is negative. Procalcitonin is significantly elevated. Oxygenation progressively worsening. Aggressively treating hypoxic respiratory failure with steroids, and nebulized therapy as well as inhalers. Increasing steroids to twice daily. SLP consulted.  No evidence of aspiration.   Concern for upper GI bleed-ruled out. Patient had an episode of dark vomiting. No prior history of GI bleed. On daily aspirin . Hemoglobin relatively stable. GI was consulted although patient reports to GI that the patient had some dark-colored cake which she vomited which was thought to be possible blood. For now we will continue with PPI. Monitor for now.   Chronic combined systolic and diastolic CHF. CAD HTN EF 40%. Grade 1 diastolic dysfunction. Currently no evidence of volume overload. For now we will monitor. Holding home antihypertensive regimen losartan , Aldactone , Jardiance , metoprolol  and ivabradine . Holding aspirin  due to concern for GI bleed.  Resume aspirin . Hold statin for now.   Acute kidney injury. Baseline serum creatinine 0.8. Serum creatinine upon admission 1.49. Improving for now. Will monitor.   Iron deficiency anemia. Macrocytosis. Iron level undetectable. B12 folic acid  normal. Initiate oral iron.  Monitor.    Abdomen pain with vomiting. CT abdomen pelvis with contrast performed in the ED. Shows pneumonia but no acute intra-abdominal pathology. Some constipation seen.  Constipation resolved for now. Monitor.   Adult failure to thrive.  Likely related to esophageal dysmotility. Body mass index is 18.55 kg/m.  Placing the patient at high risk of poor outcome. Continue supplement and regular diet.

## 2024-11-05 NOTE — TOC Progression Note (Signed)
 Transition of Care [facility]) - Progression Note    Patient Details  Name: Carol  JERMEKA [name] MRN: [id number] Date of Birth: May 02, 1936  Transition [facility]) CM/SW Contact  Sonda Manuella Quill, RN Phone Number: 11/05/2024, 5:09 PM  Clinical Narrative:    Referral for HHPT accepted by [facility] in hub; agency contact info placed in follow up provider section of d/c instructions.   Expected Discharge Plan: Home w Home Health Services Barriers to Discharge: Continued Medical Work up               Expected Discharge Plan and Services   Discharge Planning Services: CM Consult   Living arrangements for the past 2 months: Single Family Home                           HH Arranged: PT HH Agency: [facility] Date HH Agency Contacted: 11/05/24 Time HH Agency Contacted: 1709 Representative spoke with at [facility] Agency: [facility]   Social Drivers of Health (SDOH) Interventions SDOH Screenings   Food Insecurity: No Food Insecurity (11/05/2024)  Housing: Low Risk (11/05/2024)  Transportation Needs: No Transportation Needs (11/05/2024)  Utilities: Not At Risk (11/05/2024)  Tobacco Use: Medium Risk (11/03/2024)    Readmission Risk Interventions    11/05/2024    4:38 PM 11/04/2024    8:43 AM  Readmission Risk Prevention Plan  Transportation Screening Complete Complete  PCP or Specialist Appt within 5-7 Days Complete Complete  Home Care Screening Complete Complete  Medication Review (RN CM) Complete Complete

## 2024-11-05 NOTE — Evaluation (Signed)
 Physical Therapy Evaluation Patient Details Name: Carol  CARMELLIA [name] MRN: [id number] DOB: 07-Sep-1936 Today's Date: 11/05/2024  History of Present Illness  Pt is a 88 y.o. female who presented due to cough and confusion. Pt was admitted due to hypoxic respiratory failure and community PNA. PMH combined systolic and diastolic congestive heart failure, hypertension, GERD  Clinical Impression  Pt admitted with above diagnosis. Pt in bed with son at bedside, pt agreeable to session, looking forward to a nap. Pt lives alone with assist from family 2-3 days during the week and assist on weekends, 3-4 hours per day. She uses communication with family when bathing calling before and after independent task. Family able to increase level of support as needed. Pt has RW but loaned it to friend/family, able to get it back. Pt able to come to sit edge of bed with mod I and completes Sit to Stand with cues for hand placement. O2 sats 93% pre activity, 91% post activity. Pt amb x76ft with CGA on 6L O2 tank via [place], 5L on wall. Pt currently with functional limitations due to the deficits listed below (see PT Problem List). Pt will benefit from acute skilled PT to increase their independence and safety with mobility to allow discharge.           If plan is discharge home, recommend the following: A little help with walking and/or transfers;Assistance with cooking/housework;Assist for transportation;Help with stairs or ramp for entrance;A little help with bathing/dressing/bathroom   Can travel by private vehicle        Equipment Recommendations None recommended by PT  Recommendations for Other Services       Functional Status Assessment Patient has had a recent decline in their functional status and demonstrates the ability to make significant improvements in function in a reasonable and predictable amount of time.     Precautions / Restrictions Precautions Precautions: Fall Recall of  Precautions/Restrictions: Intact Precaution/Restrictions Comments: watch o2 Restrictions Weight Bearing Restrictions Per Provider Order: No      Mobility  Bed Mobility Overal bed mobility: Modified Independent                  Transfers Overall transfer level: Needs assistance Equipment used: Rolling walker (2 wheels) Transfers: Sit to/from Stand Sit to Stand: Supervision           General transfer comment: cued for hand placement    Ambulation/Gait Ambulation/Gait assistance: Contact guard assist Gait Distance (Feet): 75 Feet Assistive device: Rolling walker (2 wheels) Gait Pattern/deviations: Step-through pattern, Wide base of support, Trunk flexed Gait velocity: dec     General Gait Details: Pt amb with step through reciprocal pattern dec cadence, able to complete directional changes well with good weight shift and trunk position. appropriate use of walker  Stairs Stairs: Yes       General stair comments: Pt did not formally complete steps this session due to faitgue and wanting to take a nap, but demonstrates the strength, stability, coordination, and safety awareness to complete per home setup, anticipate no greater than CGA from family  Wheelchair Mobility     Tilt Bed    Modified Rankin (Stroke Patients Only)       Balance Overall balance assessment: Needs assistance Sitting-balance support: Feet supported Sitting balance-Leahy Scale: Good     Standing balance support: No upper extremity supported Standing balance-Leahy Scale: Fair  Pertinent Vitals/Pain Pain Assessment Pain Assessment: No/denies pain    Home Living Family/patient expects to be discharged to:: Private residence Living Arrangements: Alone Available Help at Discharge: Family;Available PRN/intermittently Type of Home: House Home Access: Stairs to enter Entrance Stairs-Rails: Can reach both;Left;Right Entrance Stairs-Number of  Steps: 3   Home Layout: One level   Additional Comments: Pt has walker, but loaned to family/friend-will be able to get back    Prior Function Prior Level of Function : Independent/Modified Independent             Mobility Comments: indep ADLs Comments: indep     Extremity/Trunk Assessment   Upper Extremity Assessment Upper Extremity Assessment: Defer to OT evaluation    Lower Extremity Assessment Lower Extremity Assessment: Generalized weakness       Communication   Communication Communication: Impaired Factors Affecting Communication: Hearing impaired    Cognition Arousal: Alert Behavior During Therapy: WFL for tasks assessed/performed   PT - Cognitive impairments: No apparent impairments                         Following commands: Intact       Cueing Cueing Techniques: Verbal cues     General Comments      Exercises     Assessment/Plan    PT Assessment Patient needs continued PT services  PT Problem List Decreased strength;Decreased activity tolerance       PT Treatment Interventions DME instruction;Gait training;Stair training;Functional mobility training;Therapeutic activities;Therapeutic exercise;Balance training;Neuromuscular re-education;Patient/family education    PT Goals (Current goals can be found in the Care Plan section)  Acute Rehab PT Goals Patient Stated Goal: get back home PT Goal Formulation: With patient Time For Goal Achievement: 11/19/24 Potential to Achieve Goals: Good    Frequency Min 3X/week     Co-evaluation               AM-PAC PT 6 Clicks Mobility  Outcome Measure Help needed turning from your back to your side while in a flat bed without using bedrails?: None Help needed moving from lying on your back to sitting on the side of a flat bed without using bedrails?: None Help needed moving to and from a bed to a chair (including a wheelchair)?: A Little Help needed standing up from a chair using  your arms (e.g., wheelchair or bedside chair)?: A Little Help needed to walk in hospital room?: A Little Help needed climbing 3-5 steps with a railing? : A Little 6 Click Score: 20    End of Session Equipment Utilized During Treatment: Gait belt Activity Tolerance: Patient tolerated treatment well Patient left: in bed;with family/visitor present;with call bell/phone within reach;with bed alarm set Nurse Communication: Mobility status PT Visit Diagnosis: Difficulty in walking, not elsewhere classified (R26.2);Muscle weakness (generalized) (M62.81)    Time: 8968-8887 PT Time Calculation (min) (ACUTE ONLY): 41 min   Charges:   PT Evaluation $PT Eval Low Complexity: 1 Low PT Treatments $Gait Training: 23-37 mins PT General Charges $$ ACUTE PT VISIT: 1 Visit         Carol, PT Acute Rehabilitation Services Office: [phone] 11/05/2024   Carol [name] 11/05/2024, 11:26 AM

## 2024-11-05 NOTE — TOC Progression Note (Addendum)
 Transition of Care [facility]) - Progression Note    Patient Details  Name: Carol [name]  DANYELA POSAS MRN: [id number] Date of Birth: February 10, 1936  [facility]) CM/SW Contact  Sonda Manuella Quill, RN Phone Number: 11/05/2024, 4:28 PM  Clinical Narrative:    Beatris w/ pt in room; she requested her granddaughter Charmaine be contacted to complete assessment; spoke w/ Charmaine [phone]); she said pt lives at home; family plans for her to return w/ family support; they will provide transportation; insurance/PCP verified; she denied pt experiencing SDOH risks; pt has cane, shower chair, nebulizer; she also has home oxygen  w/ [employer]; pt has full travel tank; her granddaughter agreed to pt receiving HHPT; she does not have agency preference; referral sent via hub; she also agreed to pt receiving walker; clarified type w/ Stann, PT; referral sent to Jermaine at [place]; IP CM will follow.                     Expected Discharge Plan and Services                                               Social Drivers of Health (SDOH) Interventions SDOH Screenings   Food Insecurity: No Food Insecurity (11/05/2024)  Housing: Low Risk (11/05/2024)  Transportation Needs: No Transportation Needs (11/05/2024)  Utilities: Not At Risk (11/05/2024)  Tobacco Use: Medium Risk (11/03/2024)    Readmission Risk Interventions    11/04/2024    8:43 AM  Readmission Risk Prevention Plan  Transportation Screening Complete  PCP or Specialist Appt within 5-7 Days Complete  Home Care Screening Complete  Medication Review (RN CM) Complete

## 2024-11-05 NOTE — Progress Notes (Signed)
 [facility] Progress Note Patient: Carol [name]  DEVINA BEZOLD FMW:[id number] DOB: November 24, 1936  DOA: 11/03/2024 DOS: the patient was seen and examined on 11/05/2024  Brief Hospital Course: Patient with PMH of chronic combined CHF, HTN, HLD, GERD, mood disorder presented to the hospital with complaints of cough and confusion. Patient lives alone.  Since last 1 week has congestion and cold.  Progressively becoming confused since last was 7. Upon admission was found to have severe sepsis due to pneumonia. Receiving IV antibiotics. Assessment and Plan: Sepsis secondary to community-acquired pneumonia with acute hypoxic respiratory failure. Acute COPD exacerbation Does not use oxygen  at her baseline. Met SIRS criteria on admission with tachypnea, leukocytosis and hypoxia. Mild AKI was also seen. Currently on IV antibiotic. Respiratory virus pathogen panel is negative. Procalcitonin is significantly elevated. Oxygenation progressively worsening. Aggressively treating hypoxic respiratory failure with steroids, and nebulized therapy as well as inhalers. Increasing steroids to twice daily. SLP consulted.  No evidence of aspiration.   Concern for upper GI bleed-ruled out. Patient had an episode of dark vomiting. No prior history of GI bleed. On daily aspirin . Hemoglobin relatively stable. GI was consulted although patient reports to GI that the patient had some dark-colored cake which she vomited which was thought to be possible blood. For now we will continue with PPI. Monitor for now.   Chronic combined systolic and diastolic CHF. CAD HTN EF 40%. Grade 1 diastolic dysfunction. Currently no evidence of volume overload. For now we will monitor. Holding home antihypertensive regimen losartan , Aldactone , Jardiance , metoprolol  and ivabradine . Holding aspirin  due to concern for GI bleed.  Resume aspirin . Hold statin for now.   Acute kidney injury. Baseline serum creatinine 0.8. Serum  creatinine upon admission 1.49. Improving for now. Will monitor.   Iron deficiency anemia. Macrocytosis. Iron level undetectable. B12 folic acid  normal. Initiate oral iron.  Monitor.   Abdomen pain with vomiting. CT abdomen pelvis with contrast performed in the ED. Shows pneumonia but no acute intra-abdominal pathology. Some constipation seen.  Constipation resolved for now. Monitor.   Adult failure to thrive.  Likely related to esophageal dysmotility. Body mass index is 18.55 kg/m.  Placing the patient at high risk of poor outcome. Continue supplement and regular diet.   Subjective: No nausea no vomiting no fever no chills.  And chest pain.  Feeling better.  Physical Exam: Bilateral expiratory wheezes. Bilateral upper airway crackles. S1-S2 present. Bowel sounds are present No edema.  Data Reviewed: I have Reviewed nursing notes, Vitals, and Lab results. Since last encounter, pertinent lab results CBC and BMP   . I have ordered test including CBC and BMP and procalcitonin  .   Disposition: Status is: Inpatient Remains inpatient appropriate because: Monitor for improvement in respiratory status  enoxaparin  (LOVENOX ) injection 30 mg Start: 11/05/24 2100   Family Communication: No one at bedside Level of care: Telemetry   Vitals:   11/04/24 2100 11/05/24 0448 11/05/24 1252 11/05/24 1412  BP:  129/65 127/66   Pulse:  89 91   Resp:  16 20   Temp:  98.6 F (37 C) 98.9 F (37.2 C)   TempSrc:      SpO2: 90% 99% (!) 88% (!) 88%  Weight:      Height:         Author: Yetta Blanch, MD 11/05/2024 6:30 PM  Please look on [email or web address] to find out who is on call.

## 2024-11-06 ENCOUNTER — Other Ambulatory Visit: Payer: Self-pay | Admitting: Cardiology

## 2024-11-06 ENCOUNTER — Inpatient Hospital Stay (HOSPITAL_COMMUNITY)

## 2024-11-06 LAB — MAGNESIUM: Magnesium: 3.3 mg/dL — ABNORMAL HIGH (ref 1.7–2.4)

## 2024-11-06 LAB — BASIC METABOLIC PANEL WITH GFR
Anion gap: 12 (ref 5–15)
BUN: 40 mg/dL — ABNORMAL HIGH (ref 8–23)
CO2: 23 mmol/L (ref 22–32)
Calcium: 8.9 mg/dL (ref 8.9–10.3)
Chloride: 103 mmol/L (ref 98–111)
Creatinine, Ser: 0.95 mg/dL (ref 0.44–1.00)
GFR, Estimated: 58 mL/min — ABNORMAL LOW (ref >60)
Glucose, Bld: 128 mg/dL — ABNORMAL HIGH (ref 70–99)
Potassium: 4.8 mmol/L (ref 3.5–5.1)
Sodium: 138 mmol/L (ref 135–145)

## 2024-11-06 LAB — CBC
HCT: 39.9 % (ref 36.0–46.0)
Hemoglobin: 13.3 g/dL (ref 12.0–15.0)
MCH: 34.3 pg — ABNORMAL HIGH (ref 26.0–34.0)
MCHC: 33.3 g/dL (ref 30.0–36.0)
MCV: 102.8 fL — ABNORMAL HIGH (ref 80.0–100.0)
Platelets: 266 K/uL (ref 150–400)
RBC: 3.88 MIL/uL (ref 3.87–5.11)
RDW: 15.6 % — ABNORMAL HIGH (ref 11.5–15.5)
WBC: 9.1 K/uL (ref 4.0–10.5)
nRBC: 0.8 % — ABNORMAL HIGH (ref 0.0–0.2)

## 2024-11-06 LAB — PROCALCITONIN: Procalcitonin: 4.19 ng/mL

## 2024-11-06 MED ORDER — IPRATROPIUM-ALBUTEROL 0.5-2.5 (3) MG/3ML IN SOLN
3.0000 mL | Freq: Three times a day (TID) | RESPIRATORY_TRACT | Status: DC
  Administered 2024-11-07: 3 mL via RESPIRATORY_TRACT
  Filled 2024-11-06: qty 3

## 2024-11-06 NOTE — Progress Notes (Signed)
 [facility] Progress Note Patient: Carol  NEYLAN [name] FMW:[id number] DOB: 1936/02/15  DOA: 11/03/2024 DOS: the patient was seen and examined on 11/06/2024  Brief Hospital Course: Patient with PMH of chronic combined CHF, HTN, HLD, GERD, mood disorder presented to the hospital with complaints of cough and confusion. Patient lives alone.  Since last 1 week has congestion and cold.  Progressively becoming confused since last was 7. Upon admission was found to have severe sepsis due to pneumonia. Receiving IV antibiotics.  Assessment and Plan: Sepsis secondary to community-acquired pneumonia with acute hypoxic respiratory failure. Acute COPD exacerbation Does not use oxygen  at her baseline. Met SIRS criteria on admission with tachypnea, leukocytosis and hypoxia. Mild AKI was also seen. Respiratory virus pathogen panel is negative. Procalcitonin 13 now trending down.  Oxygenation stabilizing at 7 LPM. Aggressively treating hypoxic respiratory failure with steroids, and nebulized therapy as well as inhalers. SLP consulted.  No evidence of aspiration. Continue steroids, nebulizer therapy, antibiotics and pulmonary toilet.     Concern for upper GI bleed-ruled out. Patient had an episode of dark vomiting. No prior history of GI bleed. On daily aspirin . Hemoglobin relatively stable. GI was consulted although patient reports to GI that the patient had some dark-colored cake which she vomited which was thought to be possible blood. For now we will continue with PPI. Monitor for now.   Chronic combined systolic and diastolic CHF. CAD HTN EF 40%. Grade 1 diastolic dysfunction. Currently no evidence of volume overload. Home regimen includes losartan , Aldactone , Jardiance , metoprolol , ivabradine . Holding for now. Hold statin for now.   Acute kidney injury. Baseline serum creatinine 0.8. Serum creatinine upon admission 1.49. Improving for now. Will monitor.   Iron deficiency  anemia. Macrocytosis. Iron level undetectable. B12 folic acid  normal. Initiate oral iron.  Monitor.   Abdomen pain with vomiting. CT abdomen pelvis with contrast performed in the ED. Shows pneumonia but no acute intra-abdominal pathology. Some constipation seen.  Constipation resolved for now. Monitor.   Adult failure to thrive.  Likely related to esophageal dysmotility. Body mass index is 18.55 kg/m.  Placing the patient at high risk of poor outcome. Continue supplement and regular diet.   Subjective: Feeling better.  No nausea no vomiting.  Still on significant amount of oxygen  although suspect that the forehead oximeter probe is not reading accurately.  Physical Exam: Improving basilar crackles. Expiratory wheezing resolved. S1-S2 present. Bowel sound present. No edema.  Data Reviewed: I have Reviewed nursing notes, Vitals, and Lab results. Since last encounter, pertinent lab results CBC and BMP   . I have ordered test including CBC and BMP  .  I reviewed and independently visualized chest x-ray.  Shows improvement in pneumonia.  Disposition: Status is: Inpatient Remains inpatient appropriate because: Monitor for improvement in volume status  enoxaparin  (LOVENOX ) injection 30 mg Start: 11/05/24 2100   Family Communication: No one at bedside, discussed with granddaughter on the phone. Level of care: Telemetry   Vitals:   11/06/24 0843 11/06/24 1134 11/06/24 1237 11/06/24 1644  BP:   (!) 141/77   Pulse:   86   Resp:   15   Temp:   98.3 F (36.8 C)   TempSrc:      SpO2: 95% 94% 95% 94%  Weight:      Height:         Author: Yetta Blanch, MD 11/06/2024 5:38 PM  Please look on [email or web address] to find out who is on call.

## 2024-11-06 NOTE — Plan of Care (Signed)
   Problem: Health Behavior/Discharge Planning: Goal: Ability to manage health-related needs will improve Outcome: Not Progressing   Problem: Clinical Measurements: Goal: Respiratory complications will improve Outcome: Not Progressing   Problem: Activity: Goal: Risk for activity intolerance will decrease Outcome: Not Progressing

## 2024-11-06 NOTE — Progress Notes (Signed)
 Occupational Therapy Treatment Patient Details Name: Carol [name] MRN: [id number] DOB: Sep 14, 1936 Today's Date: 11/06/2024   History of present illness Pt is a 88 yr old female who presented due to cough and confusion. Pt was admitted due to hypoxic respiratory failure and community PNA. PMH: combined systolic and diastolic congestive heart failure, hypertension, GERD   OT comments  The pt was seen for progression of functional activity and ADL participation. She required set-up assist for upper body grooming seated EOB, then SBA to CGA for sit to stand and short distance ambulation using a RW. She was noted to be on 7L O2 via high flow nasal cannula. She reported slight fatigue with activity. Continue OT plan of care. Home health OT is recommended.       If plan is discharge home, recommend the following:  A little help with walking and/or transfers;A little help with bathing/dressing/bathroom;Assistance with cooking/housework;Assist for transportation   Equipment Recommendations  Tub/shower seat    Recommendations for Other Services      Precautions / Restrictions Restrictions Weight Bearing Restrictions Per Provider Order: No Other Position/Activity Restrictions: monitor O2       Mobility Bed Mobility   Bed Mobility: Supine to Sit, Sit to Supine     Supine to sit: Modified independent (Device/Increase time), Used rails Sit to supine: Modified independent (Device/Increase time)        Transfers Overall transfer level: Needs assistance Equipment used: None Transfers: Sit to/from Stand Sit to Stand: Supervision            Balance     Sitting balance-Leahy Scale: Good       Standing balance-Leahy Scale: Fair           ADL either performed or assessed with clinical judgement   ADL Overall ADL's : Needs assistance/impaired     Grooming: Wash/dry face;Set up;Supervision/safety;Sitting;Brushing hair Grooming Details (indicate cue type and reason):  She performed face washing and hair combing seated upright EOB.        Vision Baseline Vision/History: 1 Wears glasses           Communication Communication Factors Affecting Communication: Hearing impaired   Cognition Arousal: Alert Behavior During Therapy: WFL for tasks assessed/performed Cognition: No apparent impairments        Following commands: Intact        Cueing   Cueing Techniques: Verbal cues             Pertinent Vitals/ Pain       Pain Assessment Pain Assessment: 0-10 Pain Score: 3  Pain Location: abdomen and rectum due to hemorrhoids Pain Intervention(s): Limited activity within patient's tolerance, Monitored during session, Repositioned   Frequency  Min 2X/week        Progress Toward Goals  OT Goals(current goals can now be found in the care plan section)     Acute Rehab OT Goals Patient Stated Goal: for respiratory status to improve OT Goal Formulation: With patient Time For Goal Achievement: 11/18/24 Potential to Achieve Goals: Good  Plan         AM-PAC OT 6 Clicks Daily Activity     Outcome Measure   Help from another person eating meals?: None Help from another person taking care of personal grooming?: A Little Help from another person toileting, which includes using toliet, bedpan, or urinal?: A Little Help from another person bathing (including washing, rinsing, drying)?: A Little Help from another person to put on and taking off regular upper body clothing?: None  Help from another person to put on and taking off regular lower body clothing?: A Little 6 Click Score: 20    End of Session Equipment Utilized During Treatment: Gait belt;Oxygen ;Rolling walker (2 wheels)  OT Visit Diagnosis: Unsteadiness on feet (R26.81);Muscle weakness (generalized) (M62.81)   Activity Tolerance Patient tolerated treatment well   Patient Left in bed;with call bell/phone within reach;with bed alarm set   Nurse Communication Other (comment)  (nurse cleared the pt for therapy participation)        Time: 8649-8587 OT Time Calculation (min): 22 min  Charges: OT General Charges $OT Visit: 1 Visit OT Treatments $Self Care/Home Management : 8-22 mins     Delanna JINNY Lesches, OTR/L 11/06/2024, 2:45 PM

## 2024-11-06 NOTE — Plan of Care (Signed)
°  Problem: Clinical Measurements: Goal: Ability to maintain clinical measurements within normal limits will improve Outcome: Progressing   Problem: Clinical Measurements: Goal: Will remain free from infection Outcome: Progressing   Problem: Health Behavior/Discharge Planning: Goal: Ability to manage health-related needs will improve Outcome: Progressing   Problem: Education: Goal: Knowledge of General Education information will improve Description: Including pain rating scale, medication(s)/side effects and non-pharmacologic comfort measures Outcome: Progressing   Problem: Clinical Measurements: Goal: Respiratory complications will improve Outcome: Progressing

## 2024-11-07 DIAGNOSIS — J189 Pneumonia, unspecified organism: Secondary | ICD-10-CM | POA: Diagnosis not present

## 2024-11-07 LAB — BASIC METABOLIC PANEL WITH GFR
Anion gap: 11 (ref 5–15)
BUN: 35 mg/dL — ABNORMAL HIGH (ref 8–23)
CO2: 23 mmol/L (ref 22–32)
Calcium: 8.9 mg/dL (ref 8.9–10.3)
Chloride: 103 mmol/L (ref 98–111)
Creatinine, Ser: 0.76 mg/dL (ref 0.44–1.00)
GFR, Estimated: >60 mL/min (ref >60)
Glucose, Bld: 97 mg/dL (ref 70–99)
Potassium: 5 mmol/L (ref 3.5–5.1)
Sodium: 137 mmol/L (ref 135–145)

## 2024-11-07 LAB — CBC
HCT: 41.2 % (ref 36.0–46.0)
Hemoglobin: 13.4 g/dL (ref 12.0–15.0)
MCH: 33.8 pg (ref 26.0–34.0)
MCHC: 32.5 g/dL (ref 30.0–36.0)
MCV: 103.8 fL — ABNORMAL HIGH (ref 80.0–100.0)
Platelets: 283 K/uL (ref 150–400)
RBC: 3.97 MIL/uL (ref 3.87–5.11)
RDW: 15.5 % (ref 11.5–15.5)
WBC: 10.1 K/uL (ref 4.0–10.5)
nRBC: 0.6 % — ABNORMAL HIGH (ref 0.0–0.2)

## 2024-11-07 LAB — MAGNESIUM: Magnesium: 3.1 mg/dL — ABNORMAL HIGH (ref 1.7–2.4)

## 2024-11-07 MED ORDER — ORAL CARE MOUTH RINSE: 15.0000 mL | OROMUCOSAL | Status: DC | PRN

## 2024-11-07 MED ORDER — POLYETHYLENE GLYCOL 3350 17 G PO PACK
17.0000 g | PACK | Freq: Every day | ORAL | Status: DC
  Administered 2024-11-07 – 2024-11-08 (×2): 17 g via ORAL
  Filled 2024-11-07 (×3): qty 1

## 2024-11-07 MED ORDER — SENNOSIDES-DOCUSATE SODIUM 8.6-50 MG PO TABS
2.0000 | ORAL_TABLET | Freq: Two times a day (BID) | ORAL | Status: DC
  Administered 2024-11-07 – 2024-11-08 (×4): 2 via ORAL
  Filled 2024-11-07 (×6): qty 2

## 2024-11-07 MED ORDER — SIMETHICONE 80 MG PO CHEW
80.0000 mg | CHEWABLE_TABLET | Freq: Four times a day (QID) | ORAL | Status: DC
  Administered 2024-11-07 – 2024-11-14 (×25): 80 mg via ORAL
  Filled 2024-11-07 (×31): qty 1

## 2024-11-07 MED ORDER — IPRATROPIUM-ALBUTEROL 0.5-2.5 (3) MG/3ML IN SOLN
3.0000 mL | Freq: Two times a day (BID) | RESPIRATORY_TRACT | Status: DC
  Administered 2024-11-07 – 2024-11-08 (×2): 3 mL via RESPIRATORY_TRACT
  Filled 2024-11-07 (×2): qty 3

## 2024-11-07 MED ORDER — METHYLPREDNISOLONE SODIUM SUCC 40 MG IJ SOLR
40.0000 mg | Freq: Every day | INTRAMUSCULAR | Status: DC
  Administered 2024-11-08: 40 mg via INTRAVENOUS
  Filled 2024-11-07: qty 1

## 2024-11-07 NOTE — Progress Notes (Signed)
 [facility] Progress Note Patient: Carol [name]  A Sweeny FMW:[id number] DOB: Dec 02, 1935  DOA: 11/03/2024 DOS: the patient was seen and examined on 11/07/2024  Brief Hospital Course: Patient with PMH of chronic combined CHF, HTN, HLD, GERD, mood disorder presented to the hospital with complaints of cough and confusion. Patient lives alone.  Since last 1 week has congestion and cold.  Progressively becoming confused since last was 7. Upon admission was found to have severe sepsis due to pneumonia. Receiving IV antibiotics.  Assessment and Plan: Sepsis secondary to community-acquired pneumonia with acute hypoxic respiratory failure. Acute COPD exacerbation Does not use oxygen  at her baseline. Met SIRS criteria on admission with tachypnea, leukocytosis and hypoxia. Mild AKI was also seen. Respiratory virus pathogen panel is negative. Procalcitonin 13 now trending down.   Difficult to assess oxygenation via pulse oximetry.  ABG was ordered to assess oxygenation although unable to identify if it was done on how much oxygen . Continue to monitor. Switching steroids to daily now. Continue nebulizer therapy. Continue antibiotic. SLP consulted.  No evidence of aspiration.   Concern for upper GI bleed-ruled out. Patient had an episode of dark vomiting. No prior history of GI bleed. On daily aspirin . Hemoglobin relatively stable. GI was consulted although patient reports to GI that the patient had some dark-colored cake which she vomited which was thought to be possible blood. For now we will continue with PPI. Monitor for now.   Chronic combined systolic and diastolic CHF. CAD HTN EF 40%. Grade 1 diastolic dysfunction. Currently no evidence of volume overload. Home regimen includes losartan , Aldactone , Jardiance , metoprolol , ivabradine . Holding for now. Hold statin for now.   Acute kidney injury. Baseline serum creatinine 0.8. Serum creatinine upon admission 1.49. Improving for  now. Will monitor.   Iron deficiency anemia. Macrocytosis. Iron level undetectable. B12 folic acid  normal. Initiate oral iron.  Monitor.   Abdomen pain with vomiting. CT abdomen pelvis with contrast performed in the ED. Shows pneumonia but no acute intra-abdominal pathology. Some constipation seen.  Constipation resolved for now. Monitor.   Adult failure to thrive.  Likely related to esophageal dysmotility. Body mass index is 18.55 kg/m.  Placing the patient at high risk of poor outcome. Continue supplement and regular diet.  Constipation. Treating with bowel regimen.   Subjective: No nausea no vomiting no fever no chills but no chest pain.  Breathing better.  Physical Exam: Basal crackles. Has regular rhythm Improvement in the wheezing. Bowel sound present.  Nontender. No edema.  Data Reviewed: I have Reviewed nursing notes, Vitals, and Lab results. Since last encounter, pertinent lab results CBC and BMP   . I have ordered test including CBC and BMP  .   Disposition: Status is: Inpatient Remains inpatient appropriate because: Monitor for improvement in oxygenation  enoxaparin  (LOVENOX ) injection 30 mg Start: 11/05/24 2100   Family Communication: No one at bedside. Level of care: Telemetry   Vitals:   11/07/24 0400 11/07/24 0423 11/07/24 0750 11/07/24 1443  BP:  127/71  126/72  Pulse:    76  Resp: (!) 21 18  16   Temp:  98.3 F (36.8 C)  98 F (36.7 C)  TempSrc:      SpO2: 95%  (!) 89% 96%  Weight:      Height:         Author: Yetta Blanch, MD 11/07/2024 6:11 PM  Please look on [email or web address] to find out who is on call.

## 2024-11-07 NOTE — Plan of Care (Signed)
   Problem: Clinical Measurements: Goal: Ability to maintain clinical measurements within normal limits will improve Outcome: Progressing   Problem: Clinical Measurements: Goal: Will remain free from infection Outcome: Progressing

## 2024-11-07 NOTE — Progress Notes (Signed)
Notified Lab that ABG being sent for analysis. 

## 2024-11-08 LAB — CBC
HCT: 46.1 % — ABNORMAL HIGH (ref 36.0–46.0)
Hemoglobin: 15.7 g/dL — ABNORMAL HIGH (ref 12.0–15.0)
MCH: 34.9 pg — ABNORMAL HIGH (ref 26.0–34.0)
MCHC: 34.1 g/dL (ref 30.0–36.0)
MCV: 102.4 fL — ABNORMAL HIGH (ref 80.0–100.0)
Platelets: 480 K/uL — ABNORMAL HIGH (ref 150–400)
RBC: 4.5 MIL/uL (ref 3.87–5.11)
RDW: 15.7 % — ABNORMAL HIGH (ref 11.5–15.5)
WBC: 14.1 K/uL — ABNORMAL HIGH (ref 4.0–10.5)
nRBC: 0.4 % — ABNORMAL HIGH (ref 0.0–0.2)

## 2024-11-08 LAB — BASIC METABOLIC PANEL WITH GFR
Anion gap: 10 (ref 5–15)
BUN: 36 mg/dL — ABNORMAL HIGH (ref 8–23)
CO2: 27 mmol/L (ref 22–32)
Calcium: 9.9 mg/dL (ref 8.9–10.3)
Chloride: 100 mmol/L (ref 98–111)
Creatinine, Ser: 0.87 mg/dL (ref 0.44–1.00)
GFR, Estimated: >60 mL/min (ref >60)
Glucose, Bld: 102 mg/dL — ABNORMAL HIGH (ref 70–99)
Potassium: 6 mmol/L — ABNORMAL HIGH (ref 3.5–5.1)
Sodium: 137 mmol/L (ref 135–145)

## 2024-11-08 LAB — MAGNESIUM: Magnesium: 2.6 mg/dL — ABNORMAL HIGH (ref 1.7–2.4)

## 2024-11-08 LAB — POTASSIUM: Potassium: 4.5 mmol/L (ref 3.5–5.1)

## 2024-11-08 MED ORDER — SODIUM ZIRCONIUM CYCLOSILICATE 10 G PO PACK
10.0000 g | PACK | Freq: Once | ORAL | Status: AC
  Administered 2024-11-08: 10 g via ORAL
  Filled 2024-11-08: qty 1

## 2024-11-08 MED ORDER — ALBUTEROL SULFATE (2.5 MG/3ML) 0.083% IN NEBU
2.5000 mg | INHALATION_SOLUTION | Freq: Two times a day (BID) | RESPIRATORY_TRACT | Status: DC
  Administered 2024-11-08 – 2024-11-14 (×12): 2.5 mg via RESPIRATORY_TRACT
  Filled 2024-11-08 (×15): qty 3

## 2024-11-08 NOTE — Plan of Care (Signed)

## 2024-11-08 NOTE — Progress Notes (Signed)
 PROGRESS NOTE  Saundra  AMANADA PHILBRICK  FMW:[id number] DOB: 01-Aug-1936 DOA: 11/03/2024 PCP: Arloa Elsie SAUNDERS, MD   Brief Narrative: Patient is a 88 year old female with history of chronic combined CHF, hypertension, hyperlipidemia, GERD, mood disorder, COPD on oxygen  intermittently at 2 L/min who presented with cough, congestion.  She lives alone.  Also reported confusion on presentation.  Chest imaging on admission showed multifocal pneumonia.  Patient started on IV antibiotics and also on IV steroids for COPD.  Hospital course remarkable for oxygen  requirement of oxygen .  Trying to wean.  Assessment & Plan:  Principal Problem:   Community acquired pneumonia   Sepsis secondary to community-acquired pneumonia: Met SIRS criteria with tachypnea, leukocytosis, hypoxia, AKI.  Respiratory viral panel negative.  Chest imaging showed multifocal pneumonia.  Continue current antibiotics.  Cultures have remained negative so far.  Patient was also seen by speech therapy. Leukocytosis is most likely secondary to steroids.  Acute hypoxic respiratory failure: Secondary to pneumonia and COPD exacerbation.  On 2 L of oxygen  intermittently at home. Currently, she is on 6 to 7 L.  Her oxygen  saturation may not be accurate because of difficulty in getting accurate reading.  Continue to wean the oxygen .  Acute COPD exacerbation: Follows with pulmonology.  She cannot tolerate steroids.  Stopped.  She does not have any wheezing today.  Continue bronchodilators as needed  Chronic combined systolic/diastolic CHF: Last echo showed EF of 40%, grade 1 diastolic dysfunction.  Appears euvolemic Takes losartan , Aldactone , Jardiance , metoprolol , ivabradine  at home  History of coronary artery disease: No anginal symptoms.  History of hyperlipidemia: On statin at home.  Currently on hold  AKI: Resolved  Iron deficiency anemia/microcytosis: Iron level was undetectable.  Continue oral supplementation.  Vitamin B12 and folate  level normal.  Abdominal pain/vomiting: CT abdomen/show general no acute intra-abdominal findings.  Showed some constipation.  Continue bowel regimen.  No nausea vomiting or abdominal pain today.  Hyperkalemia: Potassium of 6 today.  Given Lokelma .  Will continue to monitor the levels.  Protein calorie nutrition/debility/deconditioning: BMI 18.5.  Will consult nutritionist.  PT recommended home health on discharge       DVT prophylaxis:enoxaparin  (LOVENOX ) injection 30 mg Start: 11/05/24 2100     Code Status: Limited: Do not attempt resuscitation (DNR) -DNR-LIMITED -Do Not Intubate/DNI   Family Communication: Several family numbers at bedside  Patient status: Patient  Patient is from : Home  Anticipated discharge to: Home with home health  Estimated DC date: 2 to 3 days   Consultants: None  Procedures: None  Antimicrobials:  Anti-infectives (From admission, onward)    Start     Dose/Rate Route Frequency Ordered Stop   11/04/24 1700  azithromycin  (ZITHROMAX ) 500 mg in sodium chloride  0.9 % 250 mL IVPB  Status:  Discontinued        500 mg 250 mL/hr over 60 Minutes Intravenous Every 24 hours 11/03/24 1630 11/04/24 0943   11/04/24 1600  cefTRIAXone  (ROCEPHIN ) 1 g in sodium chloride  0.9 % 100 mL IVPB  Status:  Discontinued        1 g 200 mL/hr over 30 Minutes Intravenous Every 24 hours 11/03/24 1630 11/04/24 0943   11/04/24 1600  cefTRIAXone  (ROCEPHIN ) 2 g in sodium chloride  0.9 % 100 mL IVPB        2 g 200 mL/hr over 30 Minutes Intravenous Every 24 hours 11/04/24 0943     11/04/24 1030  azithromycin  (ZITHROMAX ) tablet 500 mg        500 mg Oral  Daily 11/04/24 0943     11/03/24 1545  cefTRIAXone  (ROCEPHIN ) 1 g in sodium chloride  0.9 % 100 mL IVPB        1 g 200 mL/hr over 30 Minutes Intravenous  Once 11/03/24 1542 11/03/24 1715   11/03/24 1545  azithromycin  (ZITHROMAX ) 500 mg in sodium chloride  0.9 % 250 mL IVPB        500 mg 250 mL/hr over 60 Minutes Intravenous  Once  11/03/24 1542 11/03/24 1815       Subjective: Patient seen and examined at bedside today.  Lying on bed.  Appears weak and deconditioned but overall comfortable.  Family members are at bedside.  Denies any worsening shortness of breath or cough.  No abdominal pain, nausea or vomiting.  Having poor oral intake.  Still on oxygen  6 to 7 L/min but does not appear to be in respitory distress or tachypneic.  Objective: Vitals:   11/08/24 0830 11/08/24 0901 11/08/24 0922 11/08/24 1335  BP:  130/75 130/75 127/73  Pulse:  97 97 67  Resp:  20    Temp:  98.2 F (36.8 C)  (!) 97.5 F (36.4 C)  TempSrc:  Oral  Oral  SpO2: (!) 88% 92%  97%  Weight:      Height:        Intake/Output Summary (Last 24 hours) at 11/08/2024 1424 Last data filed at 11/07/2024 1520 Gross per 24 hour  Intake 236 ml  Output --  Net 236 ml   Filed Weights   11/03/24 1155  Weight: 43.1 kg    Examination:  General exam: Overall comfortable, not in distress, weak and chronically deconditioned HEENT: PERRL Respiratory system: Diminished sounds bilaterally, no wheezes or crackles  Cardiovascular system: S1 & S2 heard, RRR.  Gastrointestinal system: Abdomen is nondistended, soft and nontender. Central nervous system: Alert and oriented Extremities: No edema, no clubbing ,no cyanosis Skin: No rashes, no ulcers,no icterus     Data Reviewed: I have personally reviewed following labs and imaging studies  CBC: Recent Labs  Lab 11/04/24 0037 11/04/24 0831 11/05/24 0550 11/06/24 0527 11/07/24 0604 11/08/24 0703  WBC 14.0*  --  12.3* 9.1 10.1 14.1*  HGB 13.5 14.0 13.1 13.3 13.4 15.7*  HCT 39.6 40.9 39.3 39.9 41.2 46.1*  MCV 102.6*  --  102.3* 102.8* 103.8* 102.4*  PLT 210  --  228 266 283 480*   Basic Metabolic Panel: Recent Labs  Lab 11/04/24 0037 11/05/24 0550 11/06/24 0527 11/07/24 0604 11/08/24 0703  NA 131* 136 138 137 137  K 4.8 4.7 4.8 5.0 6.0*  CL 98 100 103 103 100  CO2 19* 24 23 23 27    GLUCOSE 69* 110* 128* 97 102*  BUN 41* 40* 40* 35* 36*  CREATININE 1.33* 1.06* 0.95 0.76 0.87  CALCIUM  8.2* 8.7* 8.9 8.9 9.9  MG  --  3.1* 3.3* 3.1* 2.6*     Recent Results (from the past 240 hours)  Resp panel by RT-PCR (RSV, Flu A&B, Covid) Anterior Nasal Swab     Status: None   Collection Time: 11/03/24  5:53 PM   Specimen: Anterior Nasal Swab  Result Value Ref Range Status   SARS Coronavirus 2 by RT PCR NEGATIVE NEGATIVE Final    Comment: (NOTE) SARS-CoV-2 target nucleic acids are NOT DETECTED.  The SARS-CoV-2 RNA is generally detectable in upper respiratory specimens during the acute phase of infection. The lowest concentration of SARS-CoV-2 viral copies this assay can detect is 138 copies/mL. A negative result  does not preclude SARS-Cov-2 infection and should not be used as the sole basis for treatment or other patient management decisions. A negative result may occur with  improper specimen collection/handling, submission of specimen other than nasopharyngeal swab, presence of viral mutation(s) within the areas targeted by this assay, and inadequate number of viral copies(<138 copies/mL). A negative result must be combined with clinical observations, patient history, and epidemiological information. The expected result is Negative.  Fact Sheet for Patients:  bloggercourse.com  Fact Sheet for Healthcare Providers:  seriousbroker.it  This test is no t yet approved or cleared by the [place]  FDA and  has been authorized for detection and/or diagnosis of SARS-CoV-2 by FDA under an Emergency Use Authorization (EUA). This EUA will remain  in effect (meaning this test can be used) for the duration of the COVID-19 declaration under Section 564(b)(1) of the Act, 21 U.S.C.section 360bbb-3(b)(1), unless the authorization is terminated  or revoked sooner.       Influenza A by PCR NEGATIVE NEGATIVE Final   Influenza B by  PCR NEGATIVE NEGATIVE Final    Comment: (NOTE) The Xpert Xpress SARS-CoV-2/FLU/RSV plus assay is intended as an aid in the diagnosis of influenza from Nasopharyngeal swab specimens and should not be used as a sole basis for treatment. Nasal washings and aspirates are unacceptable for Xpert Xpress SARS-CoV-2/FLU/RSV testing.  Fact Sheet for Patients: bloggercourse.com  Fact Sheet for Healthcare Providers: seriousbroker.it  This test is not yet approved or cleared by the [place]  FDA and has been authorized for detection and/or diagnosis of SARS-CoV-2 by FDA under an Emergency Use Authorization (EUA). This EUA will remain in effect (meaning this test can be used) for the duration of the COVID-19 declaration under Section 564(b)(1) of the Act, 21 U.S.C. section 360bbb-3(b)(1), unless the authorization is terminated or revoked.     Resp Syncytial Virus by PCR NEGATIVE NEGATIVE Final    Comment: (NOTE) Fact Sheet for Patients: bloggercourse.com  Fact Sheet for Healthcare Providers: seriousbroker.it  This test is not yet approved or cleared by the [place]  FDA and has been authorized for detection and/or diagnosis of SARS-CoV-2 by FDA under an Emergency Use Authorization (EUA). This EUA will remain in effect (meaning this test can be used) for the duration of the COVID-19 declaration under Section 564(b)(1) of the Act, 21 U.S.C. section 360bbb-3(b)(1), unless the authorization is terminated or revoked.  Performed at [facility], [address] [place], [place] [address]      Radiology Studies: No results found.  Scheduled Meds:  albuterol   2.5 mg Nebulization BID   arformoterol   15 mcg Nebulization BID   azithromycin   500 mg Oral Daily   budesonide  (PULMICORT ) nebulizer solution  0.25 mg Nebulization BID   dextromethorphan   30 mg Oral BID    enoxaparin  (LOVENOX ) injection  30 mg Subcutaneous Q24H   feeding supplement  237 mL Oral BID BM   guaiFENesin   15 mL Oral QID   metoprolol  succinate  50 mg Oral q morning   mirtazapine   30 mg Oral QHS   montelukast   10 mg Oral Daily   pantoprazole   40 mg Oral BID   polyethylene glycol  17 g Oral Daily   raloxifene   60 mg Oral Daily   revefenacin   175 mcg Nebulization Daily   senna-docusate  2 tablet Oral BID   simethicone   80 mg Oral QID   simvastatin   20 mg Oral QHS   venlafaxine  XR  150 mg Oral Q  breakfast   Continuous Infusions:  cefTRIAXone  (ROCEPHIN )  IV 2 g (11/07/24 1610)     LOS: 5 days   Ivonne Mustache, MD [facility] P12/14/2025, 2:24 PM

## 2024-11-08 NOTE — Progress Notes (Signed)
 Physical Therapy Treatment Patient Details Name: Carol [name] MRN: [id number] DOB: 1936/01/18 Today's Date: 11/08/2024   History of Present Illness Pt is a 88 y.o. female who presented due to cough and confusion. Pt was admitted due to hypoxic respiratory failure and community PNA. PMH combined systolic and diastolic congestive heart failure, hypertension, GERD    PT Comments  Pt agreeable to working with therapy a little bit. She requested to remain in room. Pt walked a short distance in room, taking step forwards and backwards. She pivoted to /from bsc. There has been difficulty getting accurate, consistent monitoring with bedside pulse ox per chart review (pulse ox not reading at all upon my arrival and throughout session). Portable pulse ox reading: 89% on 6L at rest at start of session, 90% on 6L at rest end of session-difficulty getting reading during activity. Cues for pursed lip breathing during session. Assisted pt back to bed at her request. Will continue to follow and progress activity as tolerated.    If plan is discharge home, recommend the following: A little help with walking and/or transfers;Assistance with cooking/housework;Assist for transportation;Help with stairs or ramp for entrance;A little help with bathing/dressing/bathroom   Can travel by private vehicle        Equipment Recommendations  None recommended by PT    Recommendations for Other Services       Precautions / Restrictions Precautions Precautions: Fall Precaution/Restrictions Comments: watch o2 Restrictions Weight Bearing Restrictions Per Provider Order: No     Mobility  Bed Mobility Overal bed mobility: Needs Assistance Bed Mobility: Supine to Sit, Sit to Supine     Supine to sit: Supervision Sit to supine: Supervision   General bed mobility comments: lines. increased time.    Transfers Overall transfer level: Needs assistance Equipment used: None Transfers: Sit to/from Stand, Bed  to chair/wheelchair/BSC Sit to Stand: Contact guard assist Stand pivot transfers: Contact guard assist         General transfer comment: CGA for safety, light steadying. Pt stood x 3 during session. Stand pivot, bed<>bsc.    Ambulation/Gait Ambulation/Gait assistance: Contact guard assist Gait Distance (Feet): 10 Feet Assistive device: None Gait Pattern/deviations: Step-through pattern, Decreased stride length       General Gait Details: Pt ambulated short distance in room. CGA with intermittent light steadying.   Stairs             Wheelchair Mobility     Tilt Bed    Modified Rankin (Stroke Patients Only)       Balance Overall balance assessment: Needs assistance         Standing balance support: During functional activity Standing balance-Leahy Scale: Fair                              Hotel Manager: Impaired Factors Affecting Communication: Hearing impaired  Cognition Arousal: Alert Behavior During Therapy: WFL for tasks assessed/performed   PT - Cognitive impairments: No apparent impairments                         Following commands: Intact      Cueing Cueing Techniques: Verbal cues  Exercises      General Comments        Pertinent Vitals/Pain Pain Assessment Pain Assessment: Faces Faces Pain Scale: Hurts little more Pain Location: hands Pain Descriptors / Indicators: Aching Pain Intervention(s): Limited activity within patient's tolerance, Monitored during session,  Repositioned    Home Living                          Prior Function            PT Goals (current goals can now be found in the care plan section) Progress towards PT goals: Progressing toward goals    Frequency    Min 3X/week      PT Plan      Co-evaluation              AM-PAC PT 6 Clicks Mobility   Outcome Measure  Help needed turning from your back to your side while in a flat bed  without using bedrails?: None Help needed moving from lying on your back to sitting on the side of a flat bed without using bedrails?: None Help needed moving to and from a bed to a chair (including a wheelchair)?: A Little Help needed standing up from a chair using your arms (e.g., wheelchair or bedside chair)?: A Little Help needed to walk in hospital room?: A Little Help needed climbing 3-5 steps with a railing? : A Lot 6 Click Score: 19    End of Session Equipment Utilized During Treatment: Oxygen  Activity Tolerance: Patient tolerated treatment well;Patient limited by fatigue Patient left: in bed;with call bell/phone within reach;with bed alarm set   PT Visit Diagnosis: Difficulty in walking, not elsewhere classified (R26.2);Muscle weakness (generalized) (M62.81)     Time: 1451-1530 PT Time Calculation (min) (ACUTE ONLY): 39 min  Charges:    $Gait Training: 8-22 mins $Therapeutic Activity: 23-37 mins PT General Charges $$ ACUTE PT VISIT: 1 Visit                        Dannial SQUIBB, PT Acute Rehabilitation  Office: [phone]

## 2024-11-09 DIAGNOSIS — J441 Chronic obstructive pulmonary disease with (acute) exacerbation: Secondary | ICD-10-CM

## 2024-11-09 DIAGNOSIS — J9621 Acute and chronic respiratory failure with hypoxia: Secondary | ICD-10-CM

## 2024-11-09 LAB — RESPIRATORY PANEL BY PCR

## 2024-11-09 LAB — MAGNESIUM: Magnesium: 2.3 mg/dL (ref 1.7–2.4)

## 2024-11-09 LAB — BLOOD GAS, ARTERIAL
Acid-Base Excess: 9.9 mmol/L — ABNORMAL HIGH (ref 0.0–2.0)
Bicarbonate: 34.3 mmol/L — ABNORMAL HIGH (ref 20.0–28.0)
Drawn by: 331471
O2 Content: 6 L/min
O2 Saturation: 91.1 %
Patient temperature: 37
pCO2 arterial: 44 mmHg (ref 32–48)
pH, Arterial: 7.5 — ABNORMAL HIGH (ref 7.35–7.45)
pO2, Arterial: 58 mmHg — ABNORMAL LOW (ref 83–108)

## 2024-11-09 LAB — CBC
HCT: 43.4 % (ref 36.0–46.0)
Hemoglobin: 14.3 g/dL (ref 12.0–15.0)
MCH: 34.2 pg — ABNORMAL HIGH (ref 26.0–34.0)
MCHC: 32.9 g/dL (ref 30.0–36.0)
MCV: 103.8 fL — ABNORMAL HIGH (ref 80.0–100.0)
Platelets: 343 K/uL (ref 150–400)
RBC: 4.18 MIL/uL (ref 3.87–5.11)
RDW: 15.3 % (ref 11.5–15.5)
WBC: 12.3 K/uL — ABNORMAL HIGH (ref 4.0–10.5)
nRBC: 0.2 % (ref 0.0–0.2)

## 2024-11-09 LAB — BASIC METABOLIC PANEL WITH GFR
Anion gap: 10 (ref 5–15)
BUN: 31 mg/dL — ABNORMAL HIGH (ref 8–23)
CO2: 25 mmol/L (ref 22–32)
Calcium: 9.4 mg/dL (ref 8.9–10.3)
Chloride: 102 mmol/L (ref 98–111)
Creatinine, Ser: 0.66 mg/dL (ref 0.44–1.00)
GFR, Estimated: >60 mL/min (ref >60)
Glucose, Bld: 74 mg/dL (ref 70–99)
Potassium: 4.4 mmol/L (ref 3.5–5.1)
Sodium: 137 mmol/L (ref 135–145)

## 2024-11-09 LAB — STREP PNEUMONIAE URINARY ANTIGEN: Strep Pneumo Urinary Antigen: NEGATIVE

## 2024-11-09 LAB — MRSA NEXT GEN BY PCR, NASAL: MRSA by PCR Next Gen: DETECTED — AB

## 2024-11-09 MED ORDER — ADULT MULTIVITAMIN W/MINERALS CH
1.0000 | ORAL_TABLET | Freq: Every day | ORAL | Status: DC
  Administered 2024-11-09 – 2024-11-14 (×6): 1 via ORAL
  Filled 2024-11-09 (×7): qty 1

## 2024-11-09 NOTE — Telephone Encounter (Signed)
 Pt of Dr. Elmira. Does Dr. Elmira want to refill? Please advise.

## 2024-11-09 NOTE — Consult Note (Addendum)
 NAME:  Carol  MELENY [name], MRN:  [id number], DOB:  1936-02-13, LOS: 6 ADMISSION DATE:  11/03/2024 CHIEF COMPLAINT:  SOB.    REFERRING MD :  Dr Jillian, Ivonne.   History of Present Illness:  88 year old female with past medical history of CHF, hypertension, hyperlipidemia, GERD, COPD on 2 L of O2 intermittently presented with cough and congestion. Follows Dr. Gerline Rouleau. As an outpatient.  On Trelegy for COPD.  Was desaturating a little on ambulation with nocturnal desaturation though deemed not clinically significant.  Was planned to retain oxygen  concentrator in case of an emergency.  Last seen in August 2025.  Chest x-ray showing predominantly right lobar infiltrate with underlying emphysematous changes.  Talking with the patient she says she was brought to the hospital at the request of her daughter.  However she feels better.  She says she has been having cough without any phlegm for last 2 to 3 days.  She denies worsening short of breath.  She has been having subjective fevers.  She is feeling better since coming to the hospital. She is currently on 6-7 L of oxygen  however it is being titrated based on pulse ox which is not being able to be picked up accurately.  She has chronic bluish discoloration of her fingers and toes.  Interim History / Subjective:  See above.  Significant Hospital Events: 12/15> pulm consult.  PAST MEDICAL HISTORY :   has a past medical history of Anxiety, Arthritis, Colitis, Complication of anesthesia, GERD (gastroesophageal reflux disease), Headache(784.0), Hypertension, and Urinary tract infection (early aug 2014).  has a past surgical history that includes Cataract extraction, bilateral (Bilateral); LASIK; Colonoscopy with propofol  (N/A, 08/03/2013); Colonoscopy with propofol  (N/A, 02/04/2018); Esophagogastroduodenoscopy (egd) with propofol  (N/A, 05/21/2023); and biopsy (05/21/2023). Prior to Admission medications  Medication Sig Start Date End Date Taking?  Authorizing Provider  acetaminophen  (TYLENOL  8 HOUR ARTHRITIS PAIN) 650 MG CR tablet Take 650-1,300 mg by mouth every 8 (eight) hours as needed for pain. 02/01/09  Yes [provider]  aspirin  81 MG chewable tablet Chew 1 tablet (81 mg total) by mouth daily with lunch. 07/03/23  Yes Patwardhan, Manish J, MD  chlorpheniramine (CHLOR-TRIMETON) 4 MG tablet Take 4 mg by mouth daily as needed for allergies or rhinitis.   Yes [provider]  empagliflozin  (JARDIANCE ) 10 MG TABS tablet Take 1 tablet (10 mg total) by mouth daily. 04/17/24  Yes Patwardhan, Manish J, MD  famotidine  (PEPCID ) 10 MG tablet Take 20 mg by mouth daily as needed for heartburn or indigestion.   Yes [provider]  Fluticasone -Umeclidin-Vilant (TRELEGY ELLIPTA ) 100-62.5-25 MCG/ACT AEPB Inhale 1 puff into the lungs daily. 12/18/23  Yes Alva, Rakesh V, MD  ipratropium (ATROVENT ) 0.03 % nasal spray Place 2 sprays into both nostrils 3 (three) times daily. Patient taking differently: Place 2 sprays into both nostrils 3 (three) times daily as needed for rhinitis. 06/10/23  Yes Cobb, Comer GAILS, NP  ivabradine  (CORLANOR ) 5 MG TABS tablet TAKE 1 TABLET TWICE A DAY WITH MEALS 09/10/24  Yes Patwardhan, Manish J, MD  loratadine (CLARITIN) 10 MG tablet Take 10 mg by mouth daily.   Yes [provider]  LORazepam  (ATIVAN ) 1 MG tablet Take 1 tablet (1 mg total) by mouth daily at 8 pm. Patient taking differently: Take 1 mg by mouth at bedtime. 09/12/22  Yes Arrien, Elidia Sieving, MD  losartan  (COZAAR ) 25 MG tablet TAKE 1 TABLET EVERY EVENING 07/23/24  Yes Patwardhan, Manish J, MD  metoprolol  succinate (TOPROL -XL)  50 MG 24 hr tablet TAKE 1 TABLET IN THE MORNING 07/23/24  Yes Patwardhan, Manish J, MD  mirtazapine  (REMERON ) 30 MG tablet Take 30 mg by mouth at bedtime.   Yes [provider]  montelukast  (SINGULAIR ) 10 MG tablet Take 10 mg by mouth at bedtime as needed (for allergies). 06/26/24  Yes [provider]  Multiple Vitamins-Minerals (MULTI VITAMIN/MINERALS) TABS Take 1 tablet by mouth daily with breakfast.   Yes [provider]  OXYGEN  Inhale 2 L/min into the lungs as needed (for shortness of breath).   Yes [provider]  pantoprazole  (PROTONIX ) 40 MG tablet Take 40 mg by mouth 2 (two) times daily before a meal. 05/23/22  Yes [provider]  Polyethyl Glycol-Propyl Glycol (SYSTANE OP) Place 1 drop into both eyes 3 (three) times daily as needed (for dryness).   Yes [provider]  polyethylene glycol (MIRALAX  / GLYCOLAX ) 17 g packet Take 17 g by mouth daily. Patient taking differently: Take 4.25-8.5 g by mouth daily with breakfast. 09/15/22  Yes Arrien, Elidia Sieving, MD  raloxifene  (EVISTA ) 60 MG tablet Take 60 mg by mouth daily. 12/30/17  Yes [provider]  simvastatin  (ZOCOR ) 20 MG tablet TAKE 1 TABLET AT BEDTIME 08/05/24  Yes Patwardhan, Manish J, MD  sodium chloride  (OCEAN) 0.65 % SOLN nasal spray Place 1 spray into both nostrils as needed for congestion.   Yes [provider]  spironolactone  (ALDACTONE ) 25 MG tablet Take 0.5 tablets (12.5 mg total) by mouth daily. 10/29/24  Yes Patwardhan, Manish J, MD  triamcinolone (NASACORT ALLERGY 24HR) 55 MCG/ACT AERO nasal inhaler Place 2 sprays into the nose in the morning.   Yes [provider]  triamcinolone ointment (KENALOG) 0.5 % Apply 1 Application topically 2 (two) times daily as needed (for itching). 05/22/19  Yes [provider]  venlafaxine  XR (EFFEXOR -XR) 150 MG 24 hr capsule Take 150 mg by mouth daily with breakfast. 01/30/23  Yes [provider]    FAMILY HISTORY:  family history is not on file.  SOCIAL HISTORY:  reports that she quit smoking about 2 years ago. Her smoking use included cigarettes. She started smoking about 42 years ago. She has a 17.1 pack-year smoking history. She has never used smokeless tobacco. She reports that she does not  drink alcohol and does not use drugs.  REVIEW OF SYSTEMS:   Pertinent ROS as per HPI  VITAL SIGNS: Temp:  [97.5 F (36.4 C)-98.2 F (36.8 C)] 98.2 F (36.8 C) (12/15 0528) Pulse Rate:  [67-87] 87 (12/15 0528) Resp:  [18-25] 22 (12/15 0528) BP: (116-127)/(69-73) 127/72 (12/15 0528) SpO2:  [90 %-98 %] 90 % (12/15 0528)  PHYSICAL EXAMINATION: General: Elderly female who does not appear to be in distress provide is on supplemental oxygen  at 6-7 L. Lungs: clear to auscultation bilaterally.  No wheezing appreciated. Heart: regular rate rhythm, no murmur appreciated.  Abdomen: non tender, non distended. Normal BS.  Neuro: axox 3.  Moving all extremities. No pedal edema and no signs of fluid overload.  Recent Labs  Lab 11/07/24 0604 11/08/24 0703 11/08/24 1912 11/09/24 0443  NA 137 137  --  137  K 5.0 6.0* 4.5 4.4  CL 103 100  --  102  CO2 23 27  --  25  BUN 35* 36*  --  31*  CREATININE 0.76 0.87  --  0.66  GLUCOSE 97 102*  --  74   Recent Labs  Lab 11/07/24 0604 11/08/24 0703 11/09/24  0443  HGB 13.4 15.7* 14.3  HCT 41.2 46.1* 43.4  WBC 10.1 14.1* 12.3*  PLT 283 480* 343   No results found.  STUDIES:  CXR/CT scan/ECHO  Chest x-ray: Chronic interstitial changes predominantly on the right side with right lower lobe infiltrate.  CT abdomen reviewed: Underlying emphysematous changes with multifocal areas of infiltrate in right lower lobe.  Pro-Cal 4.19.  proBNP 25,000.  Echo 2023: EF 20-25% with mild LVH with normal right-sided function. 12/27/2022 echo: EF 40-45%. GIDD. Mild TR. No evidence of PH.   ASSESSMENT / PLAN: Acute on chronic hypoxic respiratory failure: COPD exacerbation: Multifocal pneumonia:  - Agree with antibiotic coverage for pneumonia more in the right lower lobe.  Continue ceftriaxone  plus azithromycin . - MRSA swab, U legionella, U Strep Ag, sputum cultures. - Was started on steroids before.  However was discontinued by primary team.  Unclear if  the patient is in COPD exacerbation as he does not have worsening phlegm or shortness of breath.  Okay to continue to hold. - On triple nebs.  Transition to Trelegy 200 on discharge - Will need a follow-up appointment with Izetta Cobb/Dr. Jude on discharge. - Will obtain ABG to evaluate oxygen  saturation since unable to obtain pulse oximetry.  Total care time: 60 minutes   Care time was exclusive of separately billable procedures and treating other patients.  Care was necessary to treat or prevent imminent or life-threatening deterioration.   Care was time spent personally by me on the following activities: development of treatment plan with patient and/or surrogate as well as nursing, discussions with consultants, evaluation of patient's response to treatment, examination of patient, obtaining history from patient or surrogate, ordering and performing treatments and interventions, ordering and review of laboratory studies, ordering and review of radiographic studies and pulse oximetry.   Sammi JONETTA Fredericks, MD Pulmonary, Critical Care and Sleep Attending.   11/09/2024, 11:09 AM

## 2024-11-09 NOTE — Telephone Encounter (Signed)
 No. Patient should check with PCP.  Thanks MJP

## 2024-11-09 NOTE — Progress Notes (Signed)
 PROGRESS NOTE  Jossalin  MC HOLLEN  FMW:[id number] DOB: Jun 21, 1936 DOA: 11/03/2024 PCP: Arloa Elsie SAUNDERS, MD   Brief Narrative: Patient is a 88 year old female with history of chronic combined CHF, hypertension, hyperlipidemia, GERD, mood disorder, COPD on oxygen  intermittently at 2 L/min who presented with cough, congestion.  She lives alone.  Also reported confusion on presentation.  Chest imaging on admission showed multifocal pneumonia.  Patient started on IV antibiotics and also on IV steroids for COPD.  Hospital course remarkable for oxygen  requirement of oxygen .  Trying to wean.  On 6 L of oxygen  today.  Goal is 88-92.  PCCM also consulted.  Assessment & Plan:  Principal Problem:   Community acquired pneumonia   Sepsis secondary to community-acquired pneumonia: Met SIRS criteria with tachypnea, leukocytosis, hypoxia, AKI.  Respiratory viral panel negative.  Chest imaging showed multifocal pneumonia.  Continue current antibiotics.  Cultures have remained negative so far.  Patient was also seen by speech therapy. Leukocytosis is most likely secondary to steroids, improving now  Acute hypoxic respiratory failure: Secondary to pneumonia and COPD exacerbation.  On 2 L of oxygen  intermittently at home. Currently, she is on 6 7 L.  Her oxygen  saturation may not be accurate because of difficulty in getting accurate reading.  Continue to wean the oxygen .  Goal is 88 to 92%.  PCCM also consulted.  Acute COPD exacerbation: Follows with pulmonology.  She cannot tolerate steroids.  Stopped.  She does not have any wheezing today.  Continue bronchodilators as needed.  She follows with Dr. Jude  Chronic combined systolic/diastolic CHF: Last echo showed EF of 40%, grade 1 diastolic dysfunction.  Appears euvolemic Takes losartan , Aldactone , Jardiance , metoprolol , ivabradine  at home  History of coronary artery disease: No anginal symptoms.  History of hyperlipidemia: On statin at home.  Currently on  hold  AKI: Resolved  Iron deficiency anemia/microcytosis: Iron level was undetectable.  Continue oral supplementation.  Vitamin B12 and folate level normal.  Abdominal pain/vomiting: CT abdomen/show general no acute intra-abdominal findings.  Showed some constipation.  Now having multiple bowel movements.  No nausea vomiting or abdominal pain today.  Hyperkalemia: Resolved with Lokelma   Protein calorie nutrition/debility/deconditioning: BMI 18.5.  Consulted nutritionist.  PT recommended home health on discharge       DVT prophylaxis:enoxaparin  (LOVENOX ) injection 30 mg Start: 11/05/24 2100     Code Status: Limited: Do not attempt resuscitation (DNR) -DNR-LIMITED -Do Not Intubate/DNI   Family Communication: Called and discussed with granddaughter Charmaine on phone on 12/15  Patient status: Patient  Patient is from : Home  Anticipated discharge to: Home with home health  Estimated DC date: 2 to 3 days   Consultants:PCCM  Procedures: None  Antimicrobials:  Anti-infectives (From admission, onward)    Start     Dose/Rate Route Frequency Ordered Stop   11/04/24 1700  azithromycin  (ZITHROMAX ) 500 mg in sodium chloride  0.9 % 250 mL IVPB  Status:  Discontinued        500 mg 250 mL/hr over 60 Minutes Intravenous Every 24 hours 11/03/24 1630 11/04/24 0943   11/04/24 1600  cefTRIAXone  (ROCEPHIN ) 1 g in sodium chloride  0.9 % 100 mL IVPB  Status:  Discontinued        1 g 200 mL/hr over 30 Minutes Intravenous Every 24 hours 11/03/24 1630 11/04/24 0943   11/04/24 1600  cefTRIAXone  (ROCEPHIN ) 2 g in sodium chloride  0.9 % 100 mL IVPB        2 g 200 mL/hr over 30 Minutes Intravenous Every  24 hours 11/04/24 0943     11/04/24 1030  azithromycin  (ZITHROMAX ) tablet 500 mg        500 mg Oral Daily 11/04/24 0943     11/03/24 1545  cefTRIAXone  (ROCEPHIN ) 1 g in sodium chloride  0.9 % 100 mL IVPB        1 g 200 mL/hr over 30 Minutes Intravenous  Once 11/03/24 1542 11/03/24 1715   11/03/24  1545  azithromycin  (ZITHROMAX ) 500 mg in sodium chloride  0.9 % 250 mL IVPB        500 mg 250 mL/hr over 60 Minutes Intravenous  Once 11/03/24 1542 11/03/24 1815       Subjective: Patient seen and examined at bedside today.  Hemodynamically stable.  Was comfortable this morning.  On 6 L of oxygen  per minute.  Does not appear tachypneic, not coughing.  Difficulty to wean the oxygen .  Having multiple bowel movements since yesterday.  No new complaints.  Objective: Vitals:   11/09/24 0000 11/09/24 0045 11/09/24 0100 11/09/24 0528  BP:    127/72  Pulse:    87  Resp: 20 20 20  (!) 22  Temp:    98.2 F (36.8 C)  TempSrc:      SpO2: 91% 94% 98% 90%  Weight:      Height:        Intake/Output Summary (Last 24 hours) at 11/09/2024 1144 Last data filed at 11/09/2024 0753 Gross per 24 hour  Intake 240 ml  Output --  Net 240 ml   Filed Weights   11/03/24 1155  Weight: 43.1 kg    Examination:   General exam: Overall comfortable, not in distress, weak, chronically deconditioned HEENT: PERRL Respiratory system:  no wheezes or crackles , mildly diminished sounds bilaterally Cardiovascular system: S1 & S2 heard, RRR.  Gastrointestinal system: Abdomen is nondistended, soft and nontender. Central nervous system: Alert and oriented Extremities: No edema, no clubbing ,no cyanosis Skin: No rashes, no ulcers,no icterus     Data Reviewed: I have personally reviewed following labs and imaging studies  CBC: Recent Labs  Lab 11/05/24 0550 11/06/24 0527 11/07/24 0604 11/08/24 0703 11/09/24 0443  WBC 12.3* 9.1 10.1 14.1* 12.3*  HGB 13.1 13.3 13.4 15.7* 14.3  HCT 39.3 39.9 41.2 46.1* 43.4  MCV 102.3* 102.8* 103.8* 102.4* 103.8*  PLT 228 266 283 480* 343   Basic Metabolic Panel: Recent Labs  Lab 11/05/24 0550 11/06/24 0527 11/07/24 0604 11/08/24 0703 11/08/24 1912 11/09/24 0443  NA 136 138 137 137  --  137  K 4.7 4.8 5.0 6.0* 4.5 4.4  CL 100 103 103 100  --  102  CO2 24 23  23 27   --  25  GLUCOSE 110* 128* 97 102*  --  74  BUN 40* 40* 35* 36*  --  31*  CREATININE 1.06* 0.95 0.76 0.87  --  0.66  CALCIUM  8.7* 8.9 8.9 9.9  --  9.4  MG 3.1* 3.3* 3.1* 2.6*  --  2.3     Recent Results (from the past 240 hours)  Resp panel by RT-PCR (RSV, Flu A&B, Covid) Anterior Nasal Swab     Status: None   Collection Time: 11/03/24  5:53 PM   Specimen: Anterior Nasal Swab  Result Value Ref Range Status   SARS Coronavirus 2 by RT PCR NEGATIVE NEGATIVE Final    Comment: (NOTE) SARS-CoV-2 target nucleic acids are NOT DETECTED.  The SARS-CoV-2 RNA is generally detectable in upper respiratory specimens during the acute phase of  infection. The lowest concentration of SARS-CoV-2 viral copies this assay can detect is 138 copies/mL. A negative result does not preclude SARS-Cov-2 infection and should not be used as the sole basis for treatment or other patient management decisions. A negative result may occur with  improper specimen collection/handling, submission of specimen other than nasopharyngeal swab, presence of viral mutation(s) within the areas targeted by this assay, and inadequate number of viral copies(<138 copies/mL). A negative result must be combined with clinical observations, patient history, and epidemiological information. The expected result is Negative.  Fact Sheet for Patients:  bloggercourse.com  Fact Sheet for Healthcare Providers:  seriousbroker.it  This test is no t yet approved or cleared by the [place]  FDA and  has been authorized for detection and/or diagnosis of SARS-CoV-2 by FDA under an Emergency Use Authorization (EUA). This EUA will remain  in effect (meaning this test can be used) for the duration of the COVID-19 declaration under Section 564(b)(1) of the Act, 21 U.S.C.section 360bbb-3(b)(1), unless the authorization is terminated  or revoked sooner.       Influenza A by PCR  NEGATIVE NEGATIVE Final   Influenza B by PCR NEGATIVE NEGATIVE Final    Comment: (NOTE) The Xpert Xpress SARS-CoV-2/FLU/RSV plus assay is intended as an aid in the diagnosis of influenza from Nasopharyngeal swab specimens and should not be used as a sole basis for treatment. Nasal washings and aspirates are unacceptable for Xpert Xpress SARS-CoV-2/FLU/RSV testing.  Fact Sheet for Patients: bloggercourse.com  Fact Sheet for Healthcare Providers: seriousbroker.it  This test is not yet approved or cleared by the [place]  FDA and has been authorized for detection and/or diagnosis of SARS-CoV-2 by FDA under an Emergency Use Authorization (EUA). This EUA will remain in effect (meaning this test can be used) for the duration of the COVID-19 declaration under Section 564(b)(1) of the Act, 21 U.S.C. section 360bbb-3(b)(1), unless the authorization is terminated or revoked.     Resp Syncytial Virus by PCR NEGATIVE NEGATIVE Final    Comment: (NOTE) Fact Sheet for Patients: bloggercourse.com  Fact Sheet for Healthcare Providers: seriousbroker.it  This test is not yet approved or cleared by the [place]  FDA and has been authorized for detection and/or diagnosis of SARS-CoV-2 by FDA under an Emergency Use Authorization (EUA). This EUA will remain in effect (meaning this test can be used) for the duration of the COVID-19 declaration under Section 564(b)(1) of the Act, 21 U.S.C. section 360bbb-3(b)(1), unless the authorization is terminated or revoked.  Performed at [facility], [address] [place], [place] [address]      Radiology Studies: No results found.  Scheduled Meds:  albuterol   2.5 mg Nebulization BID   arformoterol   15 mcg Nebulization BID   azithromycin   500 mg Oral Daily   budesonide  (PULMICORT ) nebulizer solution  0.25 mg Nebulization BID    dextromethorphan   30 mg Oral BID   enoxaparin  (LOVENOX ) injection  30 mg Subcutaneous Q24H   feeding supplement  237 mL Oral BID BM   guaiFENesin   15 mL Oral QID   metoprolol  succinate  50 mg Oral q morning   mirtazapine   30 mg Oral QHS   montelukast   10 mg Oral Daily   pantoprazole   40 mg Oral BID   polyethylene glycol  17 g Oral Daily   raloxifene   60 mg Oral Daily   revefenacin   175 mcg Nebulization Daily   senna-docusate  2 tablet Oral BID   simethicone   80 mg Oral  QID   simvastatin   20 mg Oral QHS   venlafaxine  XR  150 mg Oral Q breakfast   Continuous Infusions:  cefTRIAXone  (ROCEPHIN )  IV 2 g (11/08/24 1639)     LOS: 6 days   Ivonne Mustache, MD [facility] P12/15/2025, 11:44 AM

## 2024-11-09 NOTE — Progress Notes (Signed)
 Initial Nutrition Assessment  DOCUMENTATION CODES:   Severe malnutrition in context of chronic illness, Underweight  INTERVENTION:   -Ensure Enlive po BID, each supplement provides 350 kcal and 20 grams of protein.   -Magic cup TID with meals, each supplement provides 290 kcal and 9 grams of protein   -Multivitamin with minerals daily  NUTRITION DIAGNOSIS:   Severe Malnutrition related to chronic illness as evidenced by severe fat depletion, severe muscle depletion, energy intake < or equal to 75% for > or equal to 1 month.  GOAL:   Patient will meet greater than or equal to 90% of their needs  MONITOR:   PO intake, Supplement acceptance  REASON FOR ASSESSMENT:   Consult Assessment of nutrition requirement/status  ASSESSMENT:   88 year old female with history of chronic combined CHF, hypertension, hyperlipidemia, GERD, mood disorder, COPD on oxygen  intermittently at 2 L/min who presented with cough, congestion.  She lives alone.  Also reported confusion on presentation.  Chest imaging on admission showed multifocal pneumonia.  Patient in room, no family at bedside. Reports poor appetite but this has been the case for years. Pt doesn't eat much at baseline, maybe 1/2 a meal then snacks  on things throughout the day. She did eat some cereal this morning but didn't like her lunch so only ate a little of her lunch. Denies swallowing or chewing issues. States her taste has been bland for years. States she used to drink Ensure/Boost years ago but is not interested in them now and won't drink them. Open to trying Magic cups so will order on meals.   Per weight records, pt states 3 years ago she weighed ~79 lbs. Her weight increased but then started to go down recently. Current weight: 95 lbs.  Medications: Remeron , Miralax , Senokot  Labs reviewed.  NUTRITION - FOCUSED PHYSICAL EXAM:  Flowsheet Row Most Recent Value  Orbital Region Severe depletion  Upper Arm Region Severe  depletion  Thoracic and Lumbar Region Severe depletion  Buccal Region Severe depletion  Temple Region Severe depletion  Clavicle Bone Region Severe depletion  Clavicle and Acromion Bone Region Severe depletion  Scapular Bone Region Severe depletion  Dorsal Hand Severe depletion  Patellar Region Severe depletion  Anterior Thigh Region Severe depletion  Posterior Calf Region Severe depletion  Edema (RD Assessment) None  Hair Reviewed  Eyes Reviewed  Mouth Reviewed  [dentures]  Skin Reviewed  Nails Reviewed    Diet Order:   Diet Order             Diet regular Room service appropriate? Yes; Fluid consistency: Thin  Diet effective now                   EDUCATION NEEDS:   Education needs have been addressed  Skin:  Skin Assessment: Reviewed RN Assessment  Last BM:  12/14 -type 3 & 6  Height:   Ht Readings from Last 1 Encounters:  11/03/24 5' (1.524 m)    Weight:   Wt Readings from Last 1 Encounters:  11/03/24 43.1 kg    BMI:  Body mass index is 18.55 kg/m.  Estimated Nutritional Needs:   Kcal:  1100-1300  Protein:  60-70g  Fluid:  1.3L/day   Morna Lee, MS, RD, LDN Inpatient Clinical Dietitian Contact via Secure chat

## 2024-11-09 NOTE — Progress Notes (Signed)
 Clinical/Bedside Swallow Evaluation Patient Details  Name: Carol  PEIGHTYN [name] MRN: [id number] Date of Birth: September 28, 1936  Today's Date: 11/09/2024 Time:    8884-8844    Note filed from visit on 11/04/2024  Past Medical History:  Past Medical History:  Diagnosis Date   Anxiety    Arthritis    arthritis -hips   Colitis    Complication of anesthesia    B/P dropped post colonoscopy x1   GERD (gastroesophageal reflux disease)    Headache(784.0)    migraines rare occ.   Hypertension    Urinary tract infection early aug 2014   Past Surgical History:  Past Surgical History:  Procedure Laterality Date   BIOPSY  05/21/2023   Procedure: BIOPSY;  Surgeon: Dianna Specking, MD;  Location: [facility];  Service: Gastroenterology;;   CATARACT EXTRACTION, BILATERAL Bilateral    COLONOSCOPY WITH PROPOFOL  N/A 08/03/2013   Procedure: COLONOSCOPY WITH PROPOFOL ;  Surgeon: Lynwood LITTIE Celestia Mickey., MD;  Location: [facility];  Service: Endoscopy;  Laterality: N/A;  ultra thin colon scope   COLONOSCOPY WITH PROPOFOL  N/A 02/04/2018   Procedure: COLONOSCOPY WITH PROPOFOL ;  Surgeon: Celestia Lynwood, MD;  Location: [facility];  Service: Endoscopy;  Laterality: N/A;   ESOPHAGOGASTRODUODENOSCOPY (EGD) WITH PROPOFOL  N/A 05/21/2023   Procedure: ESOPHAGOGASTRODUODENOSCOPY (EGD) WITH PROPOFOL ;  Surgeon: Dianna Specking, MD;  Location: [facility];  Service: Gastroenterology;  Laterality: N/A;   LASIK     HPI:  (P) adm w/weakness/fatigue, abdomen pain. PMH + for COPD, GERD, gastritis.  Prior esophagram 08/2022 moderate esophageal dysmotility with to and fro flow, not test tablet.  Has taken PPI and H2 blocker in the past.  CT chest 11/03/2024 Airspace disease throughout the right middle and right lower lobes consistent with infection or aspiration. Background emphysema.  Swallow evaluation ordered due to concern for aspiration.  .    Assessment / Plan / Recommendation  Clinical Impression  (P) Patient  demonstrates  failure of 3 ounce yale swallow screening that may indicate silent aspiration.  She demonstrates cough post-3 ounce test.  No s/s of aspiration with small single sips.  Pt admits to globus sensation at times as well as some hoarseness.  Her intake has been poor due to lack of appetite and she desires regular/thin diet.  Recommend reg/thin diet with general precautions - considering consuming water with meals.  Encouraged her to eat small frequent meals and pay attention if she becomes short of breath.  Suspect primary aspiration risk is her known esophageal deficits.  Will follow up to determine if instrumental evaluation may be helpful while pt is in the hospital.  Pt and granddaughter advise they will order all of her meals to assure she gets food she can manage.  They also advise being comfortable with implementing precautions and follow up with ENT as an OP in a few weeks. SLP Visit Diagnosis: (P) Dysphagia, unspecified (R13.10)    Aspiration Risk  (P) Mild aspiration risk    Diet Recommendation     Regular/thin      Other Recommendations Oral Care Recommendations: (P) Oral care BID     Swallow Evaluation Recommendations  F/u with ENT as an OP   Assistance Recommended at Discharge  N/a  Functional Status Assessment (P) Patient has had a recent decline in their functional status and/or demonstrates limited ability to make significant improvements in function in a reasonable and predictable amount of time  Frequency and Duration (P) min 1 x/week  (P) 1 week  Prognosis Prognosis for improved oropharyngeal function: (P) Guarded Barriers to Reach Goals: (P) Time post onset      Swallow Study   General Date of Onset: (P) 11/04/24 HPI: (P) adm w/weakness/fatigue, abdomen pain. PMH + for COPD, GERD, gastritis.  Prior esophagram 08/2022 moderate esophageal dysmotility with to and fro flow, not test tablet.  Has taken PPI and H2 blocker in the past.  CT chest 11/03/2024  Airspace disease throughout the right middle and right lower lobes consistent with infection or aspiration. Background emphysema.  Swallow evaluation ordered due to concern for aspiration.  . Previous Swallow Assessment: (P) see HPI Diet Prior to this Study: (P) Regular;Thin liquids (Level 0) Respiratory Status: (P) Room air History of Recent Intubation: (P) No Behavior/Cognition: (P) Alert;Cooperative Vision: (P) Functional for self-feeding Baseline Vocal Quality: (P) Hoarse Volitional Cough: (P) Strong Volitional Swallow: (P) Able to elicit    Oral/Motor/Sensory Function Overall Oral Motor/Sensory Function: (P) Within functional limits   Ice Chips   DNT  Thin Liquid Thin Liquid: (P) Impaired (pt did not pass 3 ounce Yale water challenge c/b overt coughing but no s/s of aspiration with small single sips) Pharyngeal  Phase Impairments: (P) Cough - Immediate    Nectar Thick Nectar Thick Liquid: (P) Not tested   Honey Thick   DNT  Puree Puree: (P) Within functional limits Presentation: (P) Self Fed;Spoon   Solid     Solid: (P) Within functional limits Presentation: (P) Self Fed      Nicolas Emmie Caldron 11/09/2024,12:07 PM  Madelin POUR, MS [facility] SLP Acute Rehab Services Office [phone]

## 2024-11-09 NOTE — Plan of Care (Signed)
°  Problem: Clinical Measurements: Goal: Ability to maintain clinical measurements within normal limits will improve Outcome: Progressing   Problem: Clinical Measurements: Goal: Will remain free from infection Outcome: Progressing   Problem: Clinical Measurements: Goal: Respiratory complications will improve Outcome: Progressing   Problem: Elimination: Goal: Will not experience complications related to bowel motility Outcome: Progressing

## 2024-11-10 ENCOUNTER — Telehealth: Payer: Self-pay

## 2024-11-10 DIAGNOSIS — B348 Other viral infections of unspecified site: Secondary | ICD-10-CM

## 2024-11-10 LAB — BLOOD GAS, ARTERIAL
Acid-Base Excess: 10.5 mmol/L — ABNORMAL HIGH (ref 0.0–2.0)
Bicarbonate: 34.3 mmol/L — ABNORMAL HIGH (ref 20.0–28.0)
Drawn by: 25770
O2 Content: 2 L/min
O2 Saturation: 86 %
Patient temperature: 37
pCO2 arterial: 41 mmHg (ref 32–48)
pH, Arterial: 7.53 — ABNORMAL HIGH (ref 7.35–7.45)
pO2, Arterial: 51 mmHg — ABNORMAL LOW (ref 83–108)

## 2024-11-10 NOTE — Progress Notes (Signed)
 PROGRESS NOTE  Carol [name]  Carol [name]  FMW:[id number] DOB: 1936/09/23 DOA: 11/03/2024 PCP: Arloa Elsie SAUNDERS, MD   Brief Narrative: Patient is a 88 year old female with history of chronic combined CHF, hypertension, hyperlipidemia, GERD, mood disorder, COPD on oxygen  intermittently at 2 L/min who presented with cough, congestion.  She lives alone.  Also reported confusion on presentation.  Chest imaging on admission showed multifocal pneumonia.  Patient started on IV antibiotics and also on IV steroids for COPD.  Hospital course remarkable for oxygen  requirement of oxygen .  Trying to wean.  On 5 L of oxygen  today.  Goal is 88-92.  PCCM also consulted.  Planning to wean the oxygen  before discharge  Assessment & Plan:  Principal Problem:   Community acquired pneumonia   Sepsis secondary to community-acquired pneumonia: Met SIRS criteria with tachypnea, leukocytosis, hypoxia, AKI.  Respiratory viral panel showed rhinovirus.  Chest imaging showed multifocal pneumonia.  Continue current antibiotics.  Cultures have remained negative so far.  Patient was also seen by speech therapy. Leukocytosis is most likely secondary to steroids, improving now  Acute hypoxic respiratory failure: Secondary to pneumonia and COPD exacerbation.  On 2 L of oxygen  intermittently at home. Currently, she is on 5-6 L.  Her oxygen  saturation may not be accurate because of difficulty in getting accurate reading.  Continue to wean the oxygen .  Goal is 88 to 92%.  PCCM also consulted.  Planning to get ABG from brachial site today by keeping her on 2 L of oxygen .  Acute COPD exacerbation: Follows with pulmonology.  She cannot tolerate steroids.  Stopped.  She does not have any wheezing today.  Continue bronchodilators as needed.  She follows with Dr. Jude  Chronic combined systolic/diastolic CHF: Last echo showed EF of 40%, grade 1 diastolic dysfunction.  Appears euvolemic Takes losartan , Aldactone , Jardiance , metoprolol , ivabradine   at home  History of coronary artery disease: No anginal symptoms.  History of hyperlipidemia: On statin at home.  Currently on hold  AKI: Resolved  Iron deficiency anemia/microcytosis: Iron level was undetectable.  Continue oral supplementation.  Vitamin B12 and folate level normal.  Abdominal pain/vomiting: CT abdomen/show general no acute intra-abdominal findings.  Showed some constipation.  Now having multiple bowel movements.  No nausea vomiting or abdominal pain today.  Hyperkalemia: Resolved with Lokelma   Protein calorie nutrition/debility/deconditioning: BMI 18.5.  Consulted nutritionist.  PT recommended home health on discharge  Nutrition Problem: Severe Malnutrition Etiology: chronic illness    DVT prophylaxis:enoxaparin  (LOVENOX ) injection 30 mg Start: 11/05/24 2100     Code Status: Limited: Do not attempt resuscitation (DNR) -DNR-LIMITED -Do Not Intubate/DNI   Family Communication: Called and discussed with granddaughter Charmaine on phone on 12/15.  Discussed with family member at bedside today  Patient status: Patient  Patient is from : Home  Anticipated discharge to: Home with home health  Estimated DC date: Tomorrow   Consultants:PCCM  Procedures: None  Antimicrobials:  Anti-infectives (From admission, onward)    Start     Dose/Rate Route Frequency Ordered Stop   11/04/24 1700  azithromycin  (ZITHROMAX ) 500 mg in sodium chloride  0.9 % 250 mL IVPB  Status:  Discontinued        500 mg 250 mL/hr over 60 Minutes Intravenous Every 24 hours 11/03/24 1630 11/04/24 0943   11/04/24 1600  cefTRIAXone  (ROCEPHIN ) 1 g in sodium chloride  0.9 % 100 mL IVPB  Status:  Discontinued        1 g 200 mL/hr over 30 Minutes Intravenous Every 24 hours 11/03/24 1630  11/04/24 0943   11/04/24 1600  cefTRIAXone  (ROCEPHIN ) 2 g in sodium chloride  0.9 % 100 mL IVPB        2 g 200 mL/hr over 30 Minutes Intravenous Every 24 hours 11/04/24 0943 11/12/24 2359   11/04/24 1030   azithromycin  (ZITHROMAX ) tablet 500 mg        500 mg Oral Daily 11/04/24 0943 11/12/24 2359   11/03/24 1545  cefTRIAXone  (ROCEPHIN ) 1 g in sodium chloride  0.9 % 100 mL IVPB        1 g 200 mL/hr over 30 Minutes Intravenous  Once 11/03/24 1542 11/03/24 1715   11/03/24 1545  azithromycin  (ZITHROMAX ) 500 mg in sodium chloride  0.9 % 250 mL IVPB        500 mg 250 mL/hr over 60 Minutes Intravenous  Once 11/03/24 1542 11/03/24 1815       Subjective: Patient seen and examined at bedside today.  Still on 5 to 6 L of oxygen  per minute.  Not in any respiratory distress but not coughing.  No worsening of shortness of breath or cough.  Today [birth year] birthday.  Plan to obtain ABG from brachial site.  Objective: Vitals:   11/09/24 2131 11/09/24 2134 11/10/24 0419 11/10/24 0816  BP:   (!) 121/56   Pulse:   92   Resp:   20   Temp:   98.8 F (37.1 C)   TempSrc:      SpO2: 93% 93% 94% (S) 90%  Weight:      Height:        Intake/Output Summary (Last 24 hours) at 11/10/2024 1121 Last data filed at 11/09/2024 1825 Gross per 24 hour  Intake 180 ml  Output --  Net 180 ml   Filed Weights   11/03/24 1155  Weight: 43.1 kg    Examination:    General exam: Overall comfortable, not in distress, very pleasant elderly female, chronically deconditioned HEENT: PERRL Respiratory system:  no wheezes or crackles , mildly diminished sounds bilaterally Cardiovascular system: S1 & S2 heard, RRR.  Gastrointestinal system: Abdomen is nondistended, soft and nontender. Central nervous system: Alert and oriented Extremities: No edema, no clubbing ,no cyanosis Skin: No rashes, no ulcers,no icterus      Data Reviewed: I have personally reviewed following labs and imaging studies  CBC: Recent Labs  Lab 11/05/24 0550 11/06/24 0527 11/07/24 0604 11/08/24 0703 11/09/24 0443  WBC 12.3* 9.1 10.1 14.1* 12.3*  HGB 13.1 13.3 13.4 15.7* 14.3  HCT 39.3 39.9 41.2 46.1* 43.4  MCV 102.3* 102.8* 103.8* 102.4*  103.8*  PLT 228 266 283 480* 343   Basic Metabolic Panel: Recent Labs  Lab 11/05/24 0550 11/06/24 0527 11/07/24 0604 11/08/24 0703 11/08/24 1912 11/09/24 0443  NA 136 138 137 137  --  137  K 4.7 4.8 5.0 6.0* 4.5 4.4  CL 100 103 103 100  --  102  CO2 24 23 23 27   --  25  GLUCOSE 110* 128* 97 102*  --  74  BUN 40* 40* 35* 36*  --  31*  CREATININE 1.06* 0.95 0.76 0.87  --  0.66  CALCIUM  8.7* 8.9 8.9 9.9  --  9.4  MG 3.1* 3.3* 3.1* 2.6*  --  2.3     Recent Results (from the past 240 hours)  Resp panel by RT-PCR (RSV, Flu A&B, Covid) Anterior Nasal Swab     Status: None   Collection Time: 11/03/24  5:53 PM   Specimen: Anterior Nasal Swab  Result  Value Ref Range Status   SARS Coronavirus 2 by RT PCR NEGATIVE NEGATIVE Final    Comment: (NOTE) SARS-CoV-2 target nucleic acids are NOT DETECTED.  The SARS-CoV-2 RNA is generally detectable in upper respiratory specimens during the acute phase of infection. The lowest concentration of SARS-CoV-2 viral copies this assay can detect is 138 copies/mL. A negative result does not preclude SARS-Cov-2 infection and should not be used as the sole basis for treatment or other patient management decisions. A negative result may occur with  improper specimen collection/handling, submission of specimen other than nasopharyngeal swab, presence of viral mutation(s) within the areas targeted by this assay, and inadequate number of viral copies(<138 copies/mL). A negative result must be combined with clinical observations, patient history, and epidemiological information. The expected result is Negative.  Fact Sheet for Patients:  bloggercourse.com  Fact Sheet for Healthcare Providers:  seriousbroker.it  This test is no t yet approved or cleared by the [place]  FDA and  has been authorized for detection and/or diagnosis of SARS-CoV-2 by FDA under an Emergency Use Authorization (EUA). This  EUA will remain  in effect (meaning this test can be used) for the duration of the COVID-19 declaration under Section 564(b)(1) of the Act, 21 U.S.C.section 360bbb-3(b)(1), unless the authorization is terminated  or revoked sooner.       Influenza A by PCR NEGATIVE NEGATIVE Final   Influenza B by PCR NEGATIVE NEGATIVE Final    Comment: (NOTE) The Xpert Xpress SARS-CoV-2/FLU/RSV plus assay is intended as an aid in the diagnosis of influenza from Nasopharyngeal swab specimens and should not be used as a sole basis for treatment. Nasal washings and aspirates are unacceptable for Xpert Xpress SARS-CoV-2/FLU/RSV testing.  Fact Sheet for Patients: bloggercourse.com  Fact Sheet for Healthcare Providers: seriousbroker.it  This test is not yet approved or cleared by the [place]  FDA and has been authorized for detection and/or diagnosis of SARS-CoV-2 by FDA under an Emergency Use Authorization (EUA). This EUA will remain in effect (meaning this test can be used) for the duration of the COVID-19 declaration under Section 564(b)(1) of the Act, 21 U.S.C. section 360bbb-3(b)(1), unless the authorization is terminated or revoked.     Resp Syncytial Virus by PCR NEGATIVE NEGATIVE Final    Comment: (NOTE) Fact Sheet for Patients: bloggercourse.com  Fact Sheet for Healthcare Providers: seriousbroker.it  This test is not yet approved or cleared by the [place]  FDA and has been authorized for detection and/or diagnosis of SARS-CoV-2 by FDA under an Emergency Use Authorization (EUA). This EUA will remain in effect (meaning this test can be used) for the duration of the COVID-19 declaration under Section 564(b)(1) of the Act, 21 U.S.C. section 360bbb-3(b)(1), unless the authorization is terminated or revoked.  Performed at [facility], [address], [place], [place] [address]   MRSA Next Gen by PCR, Nasal     Status: Abnormal   Collection Time: 11/09/24  1:05 PM   Specimen: Nasal Mucosa; Nasal Swab  Result Value Ref Range Status   MRSA by PCR Next Gen DETECTED (A) NOT DETECTED Final    Comment: (NOTE) The GeneXpert MRSA Assay (FDA approved for NASAL specimens only), is one component of a comprehensive MRSA colonization surveillance program. It is not intended to diagnose MRSA infection nor to guide or monitor treatment for MRSA infections. Test performance is not FDA approved in patients less than 13 years old. Performed at [facility], [address] [place], [place]  [address]   Respiratory (~20 pathogens) panel by PCR     Status: Abnormal   Collection Time: 11/09/24  1:05 PM   Specimen: Nasopharyngeal Swab; Respiratory  Result Value Ref Range Status   Adenovirus NOT DETECTED NOT DETECTED Final   Coronavirus 229E NOT DETECTED NOT DETECTED Final    Comment: (NOTE) The Coronavirus on the Respiratory Panel, DOES NOT test for the novel  Coronavirus (2019 nCoV)    Coronavirus HKU1 NOT DETECTED NOT DETECTED Final   Coronavirus NL63 NOT DETECTED NOT DETECTED Final   Coronavirus OC43 NOT DETECTED NOT DETECTED Final   Metapneumovirus NOT DETECTED NOT DETECTED Final   Rhinovirus / Enterovirus DETECTED (A) NOT DETECTED Final   Influenza A NOT DETECTED NOT DETECTED Final   Influenza B NOT DETECTED NOT DETECTED Final   Parainfluenza Virus 1 NOT DETECTED NOT DETECTED Final   Parainfluenza Virus 2 NOT DETECTED NOT DETECTED Final   Parainfluenza Virus 3 NOT DETECTED NOT DETECTED Final   Parainfluenza Virus 4 NOT DETECTED NOT DETECTED Final   Respiratory Syncytial Virus NOT DETECTED NOT DETECTED Final   Bordetella pertussis NOT DETECTED NOT DETECTED Final   Bordetella Parapertussis NOT DETECTED NOT DETECTED Final   Chlamydophila pneumoniae NOT DETECTED NOT DETECTED Final   Mycoplasma pneumoniae NOT DETECTED NOT  DETECTED Final    Comment: Performed at [facility], [address] [place], [place] [address]     Radiology Studies: No results found.  Scheduled Meds:  albuterol   2.5 mg Nebulization BID   arformoterol   15 mcg Nebulization BID   azithromycin   500 mg Oral Daily   budesonide  (PULMICORT ) nebulizer solution  0.25 mg Nebulization BID   dextromethorphan   30 mg Oral BID   enoxaparin  (LOVENOX ) injection  30 mg Subcutaneous Q24H   guaiFENesin   15 mL Oral QID   metoprolol  succinate  50 mg Oral q morning   mirtazapine   30 mg Oral QHS   montelukast   10 mg Oral Daily   multivitamin with minerals  1 tablet Oral Daily   pantoprazole   40 mg Oral BID   polyethylene glycol  17 g Oral Daily   raloxifene   60 mg Oral Daily   revefenacin   175 mcg Nebulization Daily   senna-docusate  2 tablet Oral BID   simethicone   80 mg Oral QID   simvastatin   20 mg Oral QHS   venlafaxine  XR  150 mg Oral Q breakfast   Continuous Infusions:  cefTRIAXone  (ROCEPHIN )  IV 200 mL/hr at 11/09/24 1819     LOS: 7 days   Ivonne Mustache, MD [facility] P12/16/2025, 11:21 AM

## 2024-11-10 NOTE — Significant Event (Signed)
 Reviewed patient's ABG.  p a O2 low at 51 on 2 L of oxygen .  Patient is still hypoxemic.  Currently patient has been put on 4 L of oxygen .  Continue.  Will get repeat BNP though clinically the patient does not appear to be in fluid overload. Will continue treatment for COPD with nebulizers without steroids as patient has difficulty tolerating steroids.  Patient will slowly improve with improvement after contracting rhinovirus.  May need to be discharged home on oxygen .  Pulmonary will follow.

## 2024-11-10 NOTE — Progress Notes (Signed)
 Physical Therapy Treatment Patient Details Name: Carol [name]  DEEYA RICHESON MRN: [id number] DOB: 1936-09-14 Today's Date: 11/10/2024   History of Present Illness Pt is a 88 y.o. female who presented due to cough and confusion. Pt was admitted due to hypoxic respiratory failure and community PNA. PMH combined systolic and diastolic congestive heart failure, hypertension, GERD    PT Comments  Pt agreeable to working with therapy. Bedside pulse ox reading 83% on 4L at start of session. Pt ambulated to bathroom and back . Seated rest break taken. Pt then took 3 laps around the room (she politely declined hallway ambulation). Unable to get consistent, accurate O2 reading while mobilizing. After a bit of a wait, able to get pulse ox once pt was back in bed-83% on 4L. Cues for pursed lip breathing provided to pt. O2 sat reading fluctuating between 87%-89% on 4L end of session (bedside pulse ox).    If plan is discharge home, recommend the following: A little help with walking and/or transfers;Assistance with cooking/housework;Assist for transportation;Help with stairs or ramp for entrance;A little help with bathing/dressing/bathroom   Can travel by private vehicle        Equipment Recommendations  None recommended by PT    Recommendations for Other Services       Precautions / Restrictions Precautions Precautions: Fall Precaution/Restrictions Comments: monitor O2 Restrictions Weight Bearing Restrictions Per Provider Order: No     Mobility  Bed Mobility Overal bed mobility: Needs Assistance Bed Mobility: Supine to Sit, Sit to Supine     Supine to sit: Supervision, HOB elevated Sit to supine: Supervision, HOB elevated   General bed mobility comments: lines. increased time.    Transfers Overall transfer level: Needs assistance Equipment used: None Transfers: Sit to/from Stand Sit to Stand: Contact guard assist           General transfer comment: CGA for safety, light steadying. Pt  stood x 2 during session.    Ambulation/Gait Ambulation/Gait assistance: Contact guard assist Gait Distance (Feet): 35 Feet Assistive device: None (furniture cruising) Gait Pattern/deviations: Step-through pattern, Decreased stride length       General Gait Details: Pt ambulated short distance around room. CGA with intermittent light steadying. Difficulty getting consistent, accurate reading-sats around low to mid 80s% on 4L   Stairs             Wheelchair Mobility     Tilt Bed    Modified Rankin (Stroke Patients Only)       Balance           Standing balance support: During functional activity Standing balance-Leahy Scale: Fair                              Hotel Manager: Impaired Factors Affecting Communication: Hearing impaired  Cognition Arousal: Alert Behavior During Therapy: WFL for tasks assessed/performed   PT - Cognitive impairments: No apparent impairments                         Following commands: Intact      Cueing Cueing Techniques: Verbal cues  Exercises      General Comments        Pertinent Vitals/Pain Pain Assessment Pain Assessment: No/denies pain    Home Living                          Prior Function  PT Goals (current goals can now be found in the care plan section) Progress towards PT goals: Progressing toward goals    Frequency    Min 3X/week      PT Plan      Co-evaluation              AM-PAC PT 6 Clicks Mobility   Outcome Measure  Help needed turning from your back to your side while in a flat bed without using bedrails?: None Help needed moving from lying on your back to sitting on the side of a flat bed without using bedrails?: None Help needed moving to and from a bed to a chair (including a wheelchair)?: A Little Help needed standing up from a chair using your arms (e.g., wheelchair or bedside chair)?: A Little Help  needed to walk in hospital room?: A Little Help needed climbing 3-5 steps with a railing? : A Lot 6 Click Score: 19    End of Session Equipment Utilized During Treatment: Oxygen  Activity Tolerance: Patient tolerated treatment well Patient left: in bed;with call bell/phone within reach   PT Visit Diagnosis: Difficulty in walking, not elsewhere classified (R26.2);Muscle weakness (generalized) (M62.81)     Time: 8378-8348 PT Time Calculation (min) (ACUTE ONLY): 30 min  Charges:    $Gait Training: 23-37 mins PT General Charges $$ ACUTE PT VISIT: 1 Visit                         Dannial SQUIBB, PT Acute Rehabilitation  Office: [phone]

## 2024-11-10 NOTE — Progress Notes (Signed)
 RN placing PT on 2 LPM nasal cannula per MD order- RT will obtain ABG  in approximately 30 minutes post 02 change.

## 2024-11-10 NOTE — Progress Notes (Signed)
Notified Lab that ABG being sent for analysis. 

## 2024-11-10 NOTE — Plan of Care (Signed)

## 2024-11-10 NOTE — Progress Notes (Signed)
 Speech Language Pathology Treatment: Dysphagia  Patient Details Name: Carol  SYRIANA [name] MRN: [id number] DOB: 04/21/1936 Today's Date: 11/10/2024 Time: 8664-8651 SLP Time Calculation (min) (ACUTE ONLY): 13 min  Assessment / Plan / Recommendation Clinical Impression  Rec: continue with regular solids, thin liquids. SLP to s/o at this time, no f/u indicated.  Patient seen by SLP for skilled treatment session focused on dysphagia goals. Her son and granddaughter who live with her present in room. Patient reported that she continues to have a poor appetite overall and that everything runs right through me. Today is patient's birthday and her granddaughter brought her food she likes from an outside restaurant and patient has not eaten much at all of it so far. She did take several bites of her milkshake via spoon, exhibiting timely swallow initiation and no c/o of difficulty. Granddaughter denies any observed coughing or difficulty with PO intake. Patient endorses dry mouth and SLP educated in regards to use of oral moisturizer and importance of adequate intake of fluids, preferably water.    HPI HPI: adm w/weakness/fatigue, abdomen pain. PMH + for COPD, GERD, gastritis.  Prior esophagram 08/2022 moderate esophageal dysmotility with to and fro flow, not test tablet.  Has taken PPI and H2 blocker in the past.  CT chest 11/03/2024 Airspace disease throughout the right middle and right lower lobes consistent with infection or aspiration. Background emphysema.  Swallow evaluation ordered due to concern for aspiration.  Carol [name]      SLP Plan  Discharge SLP treatment due to (comment);All goals met        Swallow Evaluation Recommendations         Recommendations  Diet recommendations: Regular;Thin liquid Liquids provided via: Cup;Straw Medication Administration: Other (Comment) (as tolerated) Supervision: Patient able to self feed Compensations: Slow rate;Small sips/bites Postural Changes and/or  Swallow Maneuvers: Seated upright 90 degrees                  Oral care BID   Set up Supervision/Assistance Dysphagia, unspecified (R13.10)     Discharge SLP treatment due to (comment);All goals met     Carol IVAR Blase, MA, CCC-SLP Speech Therapy   11/10/2024, 1:53 PM

## 2024-11-10 NOTE — Telephone Encounter (Signed)
 Follow up with Pulmonologist requested.

## 2024-11-10 NOTE — Progress Notes (Signed)
 02 Saturation on 2 LPM nasal cannula = 91% (PT not moving, talking or eating). MD goal >90%- therefore placed PT on 3 LPM nasal cannula (PT 2-3 LPM 02 dependent). RN aware.

## 2024-11-10 NOTE — Plan of Care (Signed)
  Problem: Education: Goal: Knowledge of General Education information will improve Description: Including pain rating scale, medication(s)/side effects and non-pharmacologic comfort measures Outcome: Progressing   Problem: Health Behavior/Discharge Planning: Goal: Ability to manage health-related needs will improve Outcome: Progressing   Problem: Clinical Measurements: Goal: Ability to maintain clinical measurements within normal limits will improve Outcome: Progressing   Problem: Clinical Measurements: Goal: Will remain free from infection Outcome: Progressing   Problem: Pain Managment: Goal: General experience of comfort will improve and/or be controlled Outcome: Progressing

## 2024-11-10 NOTE — Progress Notes (Signed)
 NAME:  Carol  MARYLOU [name], MRN:  [id number], DOB:  09/10/1936, LOS: 7 ADMISSION DATE:  11/03/2024 CHIEF COMPLAINT:  SOB.    REFERRING MD :  Dr Jillian, Ivonne.   History of Present Illness:  88 year old female with past medical history of CHF, hypertension, hyperlipidemia, GERD, COPD on 2 L of O2 intermittently presented with cough and congestion. Follows Dr. Gerline Rouleau. As an outpatient.  On Trelegy for COPD.  Was desaturating a little on ambulation with nocturnal desaturation though deemed not clinically significant.  Was planned to retain oxygen  concentrator in case of an emergency.  Last seen in August 2025.  Chest x-ray showing predominantly right lobar infiltrate with underlying emphysematous changes.  Talking with the patient she says she was brought to the hospital at the request of her daughter.  However she feels better.  She says she has been having cough without any phlegm for last 2 to 3 days.  She denies worsening short of breath.  She has been having subjective fevers.  She is feeling better since coming to the hospital. She is currently on 6-7 L of oxygen  however it is being titrated based on pulse ox which is not being able to be picked up accurately.  She has chronic bluish discoloration of her fingers and toes.  Interim History / Subjective:  She feels well.  She feels ready to go home.  Her cough is better.  Significant Hospital Events: 12/15> pulm consult. 12/16> rhino +Ve  PAST MEDICAL HISTORY :   has a past medical history of Anxiety, Arthritis, Colitis, Complication of anesthesia, GERD (gastroesophageal reflux disease), Headache(784.0), Hypertension, and Urinary tract infection (early aug 2014).  has a past surgical history that includes Cataract extraction, bilateral (Bilateral); LASIK; Colonoscopy with propofol  (N/A, 08/03/2013); Colonoscopy with propofol  (N/A, 02/04/2018); Esophagogastroduodenoscopy (egd) with propofol  (N/A, 05/21/2023); and biopsy  (05/21/2023). Prior to Admission medications  Medication Sig Start Date End Date Taking? Authorizing Provider  acetaminophen  (TYLENOL  8 HOUR ARTHRITIS PAIN) 650 MG CR tablet Take 650-1,300 mg by mouth every 8 (eight) hours as needed for pain. 02/01/09  Yes [provider]  aspirin  81 MG chewable tablet Chew 1 tablet (81 mg total) by mouth daily with lunch. 07/03/23  Yes Patwardhan, Manish J, MD  chlorpheniramine (CHLOR-TRIMETON) 4 MG tablet Take 4 mg by mouth daily as needed for allergies or rhinitis.   Yes [provider]  empagliflozin  (JARDIANCE ) 10 MG TABS tablet Take 1 tablet (10 mg total) by mouth daily. 04/17/24  Yes Patwardhan, Manish J, MD  famotidine  (PEPCID ) 10 MG tablet Take 20 mg by mouth daily as needed for heartburn or indigestion.   Yes [provider]  Fluticasone -Umeclidin-Vilant (TRELEGY ELLIPTA ) 100-62.5-25 MCG/ACT AEPB Inhale 1 puff into the lungs daily. 12/18/23  Yes Alva, Rakesh V, MD  ipratropium (ATROVENT ) 0.03 % nasal spray Place 2 sprays into both nostrils 3 (three) times daily. Patient taking differently: Place 2 sprays into both nostrils 3 (three) times daily as needed for rhinitis. 06/10/23  Yes Cobb, Comer GAILS, NP  ivabradine  (CORLANOR ) 5 MG TABS tablet TAKE 1 TABLET TWICE A DAY WITH MEALS 09/10/24  Yes Patwardhan, Manish J, MD  loratadine (CLARITIN) 10 MG tablet Take 10 mg by mouth daily.   Yes [provider]  LORazepam  (ATIVAN ) 1 MG tablet Take 1 tablet (1 mg total) by mouth daily at 8 pm. Patient taking differently: Take 1 mg by mouth at bedtime. 09/12/22  Yes Arrien, Elidia Sieving, MD  losartan  (COZAAR ) 25 MG tablet  TAKE 1 TABLET EVERY EVENING 07/23/24  Yes Patwardhan, Manish J, MD  metoprolol  succinate (TOPROL -XL) 50 MG 24 hr tablet TAKE 1 TABLET IN THE MORNING 07/23/24  Yes Patwardhan, Manish J, MD  mirtazapine  (REMERON ) 30 MG tablet Take 30 mg by mouth at bedtime.   Yes [provider]  montelukast  (SINGULAIR ) 10 MG  tablet Take 10 mg by mouth at bedtime as needed (for allergies). 06/26/24  Yes [provider]  Multiple Vitamins-Minerals (MULTI VITAMIN/MINERALS) TABS Take 1 tablet by mouth daily with breakfast.   Yes [provider]  OXYGEN  Inhale 2 L/min into the lungs as needed (for shortness of breath).   Yes [provider]  pantoprazole  (PROTONIX ) 40 MG tablet Take 40 mg by mouth 2 (two) times daily before a meal. 05/23/22  Yes [provider]  Polyethyl Glycol-Propyl Glycol (SYSTANE OP) Place 1 drop into both eyes 3 (three) times daily as needed (for dryness).   Yes [provider]  polyethylene glycol (MIRALAX  / GLYCOLAX ) 17 g packet Take 17 g by mouth daily. Patient taking differently: Take 4.25-8.5 g by mouth daily with breakfast. 09/15/22  Yes Arrien, Elidia Sieving, MD  raloxifene  (EVISTA ) 60 MG tablet Take 60 mg by mouth daily. 12/30/17  Yes [provider]  simvastatin  (ZOCOR ) 20 MG tablet TAKE 1 TABLET AT BEDTIME 08/05/24  Yes Patwardhan, Manish J, MD  sodium chloride  (OCEAN) 0.65 % SOLN nasal spray Place 1 spray into both nostrils as needed for congestion.   Yes [provider]  spironolactone  (ALDACTONE ) 25 MG tablet Take 0.5 tablets (12.5 mg total) by mouth daily. 10/29/24  Yes Patwardhan, Manish J, MD  triamcinolone (NASACORT ALLERGY 24HR) 55 MCG/ACT AERO nasal inhaler Place 2 sprays into the nose in the morning.   Yes [provider]  triamcinolone ointment (KENALOG) 0.5 % Apply 1 Application topically 2 (two) times daily as needed (for itching). 05/22/19  Yes [provider]  venlafaxine  XR (EFFEXOR -XR) 150 MG 24 hr capsule Take 150 mg by mouth daily with breakfast. 01/30/23  Yes [provider]    FAMILY HISTORY:  family history is not on file.  SOCIAL HISTORY:  reports that she quit smoking about 2 years ago. Her smoking use included cigarettes. She started smoking about 42 years ago. She has a 17.1  pack-year smoking history. She has never used smokeless tobacco. She reports that she does not drink alcohol and does not use drugs.  REVIEW OF SYSTEMS:   Pertinent ROS as per HPI  VITAL SIGNS: Temp:  [98.6 F (37 C)-99.2 F (37.3 C)] 98.8 F (37.1 C) (12/16 0419) Pulse Rate:  [81-95] 92 (12/16 0419) Resp:  [16-20] 20 (12/16 0419) BP: (113-121)/(56-65) 121/56 (12/16 0419) SpO2:  [90 %-94 %] 90 % (12/16 0816)  PHYSICAL EXAMINATION: General: Elderly female who does not appear to be in distress.  Currently on 5 L. Lungs: clear to auscultation bilaterally.  No wheezing appreciated. Heart: regular rate rhythm, no murmur appreciated.  Abdomen: non tender, non distended. Normal BS.  Neuro: A&O x 3.  Moving all extremities. No pedal edema and no signs of fluid overload.  Recent Labs  Lab 11/07/24 0604 11/08/24 0703 11/08/24 1912 11/09/24 0443  NA 137 137  --  137  K 5.0 6.0* 4.5 4.4  CL 103 100  --  102  CO2 23 27  --  25  BUN 35* 36*  --  31*  CREATININE 0.76 0.87  --  0.66  GLUCOSE 97 102*  --  74   Recent Labs  Lab 11/07/24 0604 11/08/24 0703 11/09/24 0443  HGB 13.4 15.7* 14.3  HCT 41.2 46.1* 43.4  WBC 10.1 14.1* 12.3*  PLT 283 480* 343   No results found.  STUDIES:  CXR/CT scan/ECHO  Chest x-ray: Chronic interstitial changes predominantly on the right side with right lower lobe infiltrate.  CT abdomen reviewed: Underlying emphysematous changes with multifocal areas of infiltrate in right lower lobe.  Pro-Cal 4.19.  proBNP 25,000.  Echo 2023: EF 20-25% with mild LVH with normal right-sided function. 12/27/2022 echo: EF 40-45%. GIDD. Mild TR. No evidence of PH.   ASSESSMENT / PLAN: Acute on chronic hypoxic respiratory failure: COPD exacerbation: Multifocal pneumonia: Rhinovirus positive: Suspected poor peripheral circulation with bluish discoloration of hand and feet-chronic per patient:  - Agree with antibiotic coverage for pneumonia more in the right lower  lobe.  Continue ceftriaxone  plus azithromycin .  Complete 5-7-day course - MRSA swab-negative, U legionella-negative, U Strep Ag, sputum cultures-pending. - Was started on steroids before.  However was discontinued by primary team.  Unclear if the patient is in COPD exacerbation as she does not have worsening phlegm or shortness of breath.  Okay to continue to hold. - On triple nebs.  Transition to Trelegy 200 on discharge - Will need a follow-up appointment with Katie Cobb/Dr. Jude on discharge-requested. - Her ABG radial with PaO2 58, ABG brachial PaO2 71.  Suspect this is secondary to poor peripheral arterial circulation.  Unable to get good/reliable oxygen  saturation on fingers.  Will repeat brachial ABG on 2 L of oxygen  to determine her to oxygenation.   Will follow-up on this ABG and then pulmonary will sign off.  Total care time: 35 minutes   Care time was exclusive of separately billable procedures and treating other patients.  Care was necessary to treat or prevent imminent or life-threatening deterioration.   Care was time spent personally by me on the following activities: development of treatment plan with patient and/or surrogate as well as nursing, discussions with consultants, evaluation of patient's response to treatment, examination of patient, obtaining history from patient or surrogate, ordering and performing treatments and interventions, ordering and review of laboratory studies, ordering and review of radiographic studies and pulse oximetry.   Sammi JONETTA Fredericks, MD Pulmonary, Critical Care and Sleep Attending.   11/10/2024, 10:40 AM

## 2024-11-11 DIAGNOSIS — R197 Diarrhea, unspecified: Secondary | ICD-10-CM

## 2024-11-11 LAB — CBC
HCT: 43.8 % (ref 36.0–46.0)
Hemoglobin: 14.3 g/dL (ref 12.0–15.0)
MCH: 34 pg (ref 26.0–34.0)
MCHC: 32.6 g/dL (ref 30.0–36.0)
MCV: 104 fL — ABNORMAL HIGH (ref 80.0–100.0)
Platelets: 322 K/uL (ref 150–400)
RBC: 4.21 MIL/uL (ref 3.87–5.11)
RDW: 14.8 % (ref 11.5–15.5)
WBC: 14.1 K/uL — ABNORMAL HIGH (ref 4.0–10.5)
nRBC: 0 % (ref 0.0–0.2)

## 2024-11-11 LAB — C DIFFICILE (CDIFF) QUICK SCRN (NO PCR REFLEX)
C Diff antigen: NEGATIVE
C Diff interpretation: NOT DETECTED
C Diff toxin: NEGATIVE

## 2024-11-11 LAB — BASIC METABOLIC PANEL WITH GFR
Anion gap: 12 (ref 5–15)
BUN: 17 mg/dL (ref 8–23)
CO2: 25 mmol/L (ref 22–32)
Calcium: 9.2 mg/dL (ref 8.9–10.3)
Chloride: 104 mmol/L (ref 98–111)
Creatinine, Ser: 0.53 mg/dL (ref 0.44–1.00)
GFR, Estimated: >60 mL/min (ref >60)
Glucose, Bld: 91 mg/dL (ref 70–99)
Potassium: 3.9 mmol/L (ref 3.5–5.1)
Sodium: 141 mmol/L (ref 135–145)

## 2024-11-11 LAB — LEGIONELLA PNEUMOPHILA SEROGP 1 UR AG
L. pneumophila Serogp 1 Ur Ag: NEGATIVE
Source of Sample: 182246

## 2024-11-11 LAB — PRO BRAIN NATRIURETIC PEPTIDE: Pro Brain Natriuretic Peptide: 11644 pg/mL — ABNORMAL HIGH (ref <300.0)

## 2024-11-11 MED ORDER — LIDOCAINE VISCOUS HCL 2 % MT SOLN
15.0000 mL | OROMUCOSAL | Status: DC | PRN
  Administered 2024-11-12 – 2024-11-13 (×3): 15 mL via OROMUCOSAL
  Filled 2024-11-11 (×11): qty 15

## 2024-11-11 MED ORDER — CHLORHEXIDINE GLUCONATE CLOTH 2 % EX PADS: 6.0000 | MEDICATED_PAD | Freq: Every day | CUTANEOUS | Status: DC

## 2024-11-11 MED ORDER — MUPIROCIN 2 % EX OINT
1.0000 | TOPICAL_OINTMENT | Freq: Two times a day (BID) | CUTANEOUS | Status: DC
  Administered 2024-11-11 – 2024-11-14 (×7): 1 via NASAL
  Filled 2024-11-11 (×4): qty 22

## 2024-11-11 MED ORDER — LOPERAMIDE HCL 2 MG PO CAPS
2.0000 mg | ORAL_CAPSULE | Freq: Four times a day (QID) | ORAL | Status: DC | PRN
  Administered 2024-11-12 – 2024-11-13 (×2): 2 mg via ORAL
  Filled 2024-11-11 (×7): qty 1

## 2024-11-11 NOTE — Progress Notes (Signed)
 Occupational Therapy Treatment Patient Details Name: Carol  JANN [name] MRN: [id number] DOB: 02-Aug-1936 Today's Date: 11/11/2024   History of present illness Pt is a 88 y.o. female who presented due to cough and confusion. Pt was admitted due to hypoxic respiratory failure and community PNA. PMH combined systolic and diastolic congestive heart failure, hypertension, GERD   OT comments  Pt making steady progress toward goals although frustrated with frequent BMs. Overall CGA for mobility and ADL tasks with HHA. 2/4 DOE with activity. Difficulty to achieve good pleth. Pt appears to desat with activity to 85 however returns to @ 89/90 with PLB. Continue to recommend HHOT. Acute OT to follow.       If plan is discharge home, recommend the following:  A little help with walking and/or transfers;A little help with bathing/dressing/bathroom;Assistance with cooking/housework;Assist for transportation   Equipment Recommendations  Tub/shower seat    Recommendations for Other Services      Precautions / Restrictions Precautions Precautions: Fall Recall of Precautions/Restrictions: Intact Precaution/Restrictions Comments: monitor O2 Restrictions Weight Bearing Restrictions Per Provider Order: No Other Position/Activity Restrictions: monitor O2       Mobility Bed Mobility Overal bed mobility: Modified Independent                  Transfers Overall transfer level: Needs assistance Equipment used: None Transfers: Sit to/from Stand Sit to Stand: Contact guard assist           General transfer comment: CGA for safety, light steadying. Pt stood x 2 during session.     Balance Overall balance assessment: Needs assistance   Sitting balance-Leahy Scale: Good     Standing balance support: During functional activity Standing balance-Leahy Scale: Fair                             ADL either performed or assessed with clinical judgement   ADL Overall ADL's : Needs  assistance/impaired Eating/Feeding: Modified independent   Grooming: Set up;Sitting   Upper Body Bathing: Set up;Sitting   Lower Body Bathing: Contact guard assist;Sit to/from stand   Upper Body Dressing : Set up;Sitting   Lower Body Dressing: Contact guard assist;Sit to/from stand   Toilet Transfer: Contact guard assist;Ambulation   Toileting- Clothing Manipulation and Hygiene: Set up;Supervision/safety;Sit to/from stand       Functional mobility during ADLs: Contact guard assist General ADL Comments: HHA    Extremity/Trunk Assessment Upper Extremity Assessment Upper Extremity Assessment: Generalized weakness            Vision   Vision Assessment?: Wears glasses for reading   Perception Perception Perception: Within Functional Limits   Praxis Praxis Praxis: WFL   Communication Communication Communication: Impaired Factors Affecting Communication: Hearing impaired   Cognition Arousal: Alert Behavior During Therapy: WFL for tasks assessed/performed Cognition: No apparent impairments                               Following commands: Intact        Cueing   Cueing Techniques: Verbal cues  Exercises Exercises: Other exercises Other Exercises Other Exercises: PLB during activity Other Exercises: sit<>stand x 5    Shoulder Instructions       General Comments      Pertinent Vitals/ Pain       Pain Assessment Pain Assessment: No/denies pain Faces Pain Scale: No hurt  Home Living  Prior Functioning/Environment              Frequency  Min 2X/week        Progress Toward Goals  OT Goals(current goals can now be found in the care plan section)  Progress towards OT goals: Progressing toward goals  Acute Rehab OT Goals Patient Stated Goal: get better and go home OT Goal Formulation: With patient Time For Goal Achievement: 11/18/24 Potential to Achieve Goals:  Good ADL Goals Pt Will Perform Upper Body Bathing: with modified independence Pt Will Perform Lower Body Bathing: with modified independence Pt Will Perform Upper Body Dressing: with modified independence Pt Will Perform Lower Body Dressing: with modified independence Pt Will Transfer to Toilet: with supervision;ambulating  Plan      Co-evaluation                 AM-PAC OT 6 Clicks Daily Activity     Outcome Measure   Help from another person eating meals?: None Help from another person taking care of personal grooming?: A Little Help from another person toileting, which includes using toliet, bedpan, or urinal?: A Little Help from another person bathing (including washing, rinsing, drying)?: A Little Help from another person to put on and taking off regular upper body clothing?: A Little Help from another person to put on and taking off regular lower body clothing?: A Little 6 Click Score: 19    End of Session Equipment Utilized During Treatment: Gait belt;Oxygen ;Rolling walker (2 wheels) (4L)  OT Visit Diagnosis: Unsteadiness on feet (R26.81);Muscle weakness (generalized) (M62.81)   Activity Tolerance Patient tolerated treatment well   Patient Left with call bell/phone within reach;in chair;with chair alarm set   Nurse Communication Mobility status        Time: 1030-1056 OT Time Calculation (min): 26 min  Charges: OT General Charges $OT Visit: 1 Visit OT Treatments $Self Care/Home Management : 23-37 mins  Kreg Sink, OT/L   Acute OT Clinical Specialist Acute Rehabilitation Services Pager [phone] Office [phone]   [facility] 11/11/2024, 11:03 AM

## 2024-11-11 NOTE — Plan of Care (Signed)

## 2024-11-11 NOTE — Progress Notes (Signed)
 NAME:  Carol  CYNIA [name], MRN:  [id number], DOB:  [birth year], LOS: 8 ADMISSION DATE:  11/03/2024 CHIEF COMPLAINT:  SOB.    REFERRING MD :  Dr Jillian, Ivonne.   History of Present Illness:  88 year old female with past medical history of CHF, hypertension, hyperlipidemia, GERD, COPD on 2 L of O2 intermittently presented with cough and congestion. Follows Dr. Gerline Rouleau. As an outpatient.  On Trelegy for COPD.  Was desaturating a little on ambulation with nocturnal desaturation though deemed not clinically significant.  Was planned to retain oxygen  concentrator in case of an emergency.  Last seen in August 2025.  Chest x-ray showing predominantly right lobar infiltrate with underlying emphysematous changes.  Talking with the patient she says she was brought to the hospital at the request of her daughter.  However she feels better.  She says she has been having cough without any phlegm for last 2 to 3 days.  She denies worsening short of breath.  She has been having subjective fevers.  She is feeling better since coming to the hospital. She is currently on 6-7 L of oxygen  however it is being titrated based on pulse ox which is not being able to be picked up accurately.  She has chronic bluish discoloration of her fingers and toes.  Interim History / Subjective:  She feels well.  She is complaining of diarrhea.  Her breathing and coughing overall is getting better.  Significant Hospital Events: 12/15> pulm consult. 12/16> rhino +Ve  PAST MEDICAL HISTORY :   has a past medical history of Anxiety, Arthritis, Colitis, Complication of anesthesia, GERD (gastroesophageal reflux disease), Headache(784.0), Hypertension, and Urinary tract infection (early aug 2014).  has a past surgical history that includes Cataract extraction, bilateral (Bilateral); LASIK; Colonoscopy with propofol  (N/A, 08/03/2013); Colonoscopy with propofol  (N/A, 02/04/2018); Esophagogastroduodenoscopy (egd) with propofol  (N/A,  05/21/2023); and biopsy (05/21/2023). Prior to Admission medications  Medication Sig Start Date End Date Taking? Authorizing Provider  acetaminophen  (TYLENOL  8 HOUR ARTHRITIS PAIN) 650 MG CR tablet Take 650-1,300 mg by mouth every 8 (eight) hours as needed for pain. 02/01/09  Yes [provider]  aspirin  81 MG chewable tablet Chew 1 tablet (81 mg total) by mouth daily with lunch. 07/03/23  Yes Patwardhan, Manish J, MD  chlorpheniramine (CHLOR-TRIMETON) 4 MG tablet Take 4 mg by mouth daily as needed for allergies or rhinitis.   Yes [provider]  empagliflozin  (JARDIANCE ) 10 MG TABS tablet Take 1 tablet (10 mg total) by mouth daily. 04/17/24  Yes Patwardhan, Manish J, MD  famotidine  (PEPCID ) 10 MG tablet Take 20 mg by mouth daily as needed for heartburn or indigestion.   Yes [provider]  Fluticasone -Umeclidin-Vilant (TRELEGY ELLIPTA ) 100-62.5-25 MCG/ACT AEPB Inhale 1 puff into the lungs daily. 12/18/23  Yes Alva, Rakesh V, MD  ipratropium (ATROVENT ) 0.03 % nasal spray Place 2 sprays into both nostrils 3 (three) times daily. Patient taking differently: Place 2 sprays into both nostrils 3 (three) times daily as needed for rhinitis. 06/10/23  Yes Cobb, Comer GAILS, NP  ivabradine  (CORLANOR ) 5 MG TABS tablet TAKE 1 TABLET TWICE A DAY WITH MEALS 09/10/24  Yes Patwardhan, Manish J, MD  loratadine (CLARITIN) 10 MG tablet Take 10 mg by mouth daily.   Yes [provider]  LORazepam  (ATIVAN ) 1 MG tablet Take 1 tablet (1 mg total) by mouth daily at 8 pm. Patient taking differently: Take 1 mg by mouth at bedtime. 09/12/22  Yes Arrien, Elidia Sieving, MD  losartan  (COZAAR )  25 MG tablet TAKE 1 TABLET EVERY EVENING 07/23/24  Yes Patwardhan, Manish J, MD  metoprolol  succinate (TOPROL -XL) 50 MG 24 hr tablet TAKE 1 TABLET IN THE MORNING 07/23/24  Yes Patwardhan, Manish J, MD  mirtazapine  (REMERON ) 30 MG tablet Take 30 mg by mouth at bedtime.   Yes [provider]  montelukast   (SINGULAIR ) 10 MG tablet Take 10 mg by mouth at bedtime as needed (for allergies). 06/26/24  Yes [provider]  Multiple Vitamins-Minerals (MULTI VITAMIN/MINERALS) TABS Take 1 tablet by mouth daily with breakfast.   Yes [provider]  OXYGEN  Inhale 2 L/min into the lungs as needed (for shortness of breath).   Yes [provider]  pantoprazole  (PROTONIX ) 40 MG tablet Take 40 mg by mouth 2 (two) times daily before a meal. 05/23/22  Yes [provider]  Polyethyl Glycol-Propyl Glycol (SYSTANE OP) Place 1 drop into both eyes 3 (three) times daily as needed (for dryness).   Yes [provider]  polyethylene glycol (MIRALAX  / GLYCOLAX ) 17 g packet Take 17 g by mouth daily. Patient taking differently: Take 4.25-8.5 g by mouth daily with breakfast. 09/15/22  Yes Arrien, Elidia Sieving, MD  raloxifene  (EVISTA ) 60 MG tablet Take 60 mg by mouth daily. 12/30/17  Yes [provider]  simvastatin  (ZOCOR ) 20 MG tablet TAKE 1 TABLET AT BEDTIME 08/05/24  Yes Patwardhan, Manish J, MD  sodium chloride  (OCEAN) 0.65 % SOLN nasal spray Place 1 spray into both nostrils as needed for congestion.   Yes [provider]  spironolactone  (ALDACTONE ) 25 MG tablet Take 0.5 tablets (12.5 mg total) by mouth daily. 10/29/24  Yes Patwardhan, Manish J, MD  triamcinolone (NASACORT ALLERGY 24HR) 55 MCG/ACT AERO nasal inhaler Place 2 sprays into the nose in the morning.   Yes [provider]  triamcinolone ointment (KENALOG) 0.5 % Apply 1 Application topically 2 (two) times daily as needed (for itching). 05/22/19  Yes [provider]  venlafaxine  XR (EFFEXOR -XR) 150 MG 24 hr capsule Take 150 mg by mouth daily with breakfast. 01/30/23  Yes [provider]    FAMILY HISTORY:  family history is not on file.  SOCIAL HISTORY:  reports that she quit smoking about 2 years ago. Her smoking use included cigarettes. She started smoking about 42 years ago. She  has a 17.1 pack-year smoking history. She has never used smokeless tobacco. She reports that she does not drink alcohol and does not use drugs.  REVIEW OF SYSTEMS:   Pertinent ROS as per HPI  VITAL SIGNS: Temp:  [97.7 F (36.5 C)-98.2 F (36.8 C)] 98 F (36.7 C) (12/17 0548) Pulse Rate:  [64-90] 86 (12/17 0548) Resp:  [15-18] 15 (12/17 0548) BP: (118-138)/(63-85) 126/63 (12/17 0548) SpO2:  [84 %-92 %] 91 % (12/17 0842)  PHYSICAL EXAMINATION: General: Elderly female currently not in distress on 3 L oxygen . Lungs: No wheeze appreciated.  Clear. Heart: regular rate rhythm, no murmur appreciated.  Abdomen: non tender, non distended. Normal BS.  Neuro: A&O x 3.  Moving all extremities. No pedal edema and no signs of fluid overload.  Recent Labs  Lab 11/08/24 0703 11/08/24 1912 11/09/24 0443 11/11/24 0855  NA 137  --  137 141  K 6.0* 4.5 4.4 3.9  CL 100  --  102 104  CO2 27  --  25 25  BUN 36*  --  31* 17  CREATININE 0.87  --  0.66 0.53  GLUCOSE 102*  --  74 91  Recent Labs  Lab 11/08/24 0703 11/09/24 0443 11/11/24 0855  HGB 15.7* 14.3 14.3  HCT 46.1* 43.4 43.8  WBC 14.1* 12.3* 14.1*  PLT 480* 343 322   No results found.  STUDIES:  CXR/CT scan/ECHO  Chest x-ray: Chronic interstitial changes predominantly on the right side with right lower lobe infiltrate.  CT abdomen reviewed: Underlying emphysematous changes with multifocal areas of infiltrate in right lower lobe.  Pro-Cal 4.19.  proBNP 25,000>12000.  Echo 2023: EF 20-25% with mild LVH with normal right-sided function. 12/27/2022 echo: EF 40-45%. GIDD. Mild TR. No evidence of PH.   ASSESSMENT / PLAN: Acute on chronic hypoxic respiratory failure: COPD exacerbation: Multifocal pneumonia: Rhinovirus positive: Suspected poor peripheral circulation with bluish discoloration of hand and feet-chronic per patient:  - Agree with antibiotic coverage for pneumonia more in the right lower lobe.  Status post completion  of 8 days of antibiotics with ceftriaxone  and azithromycin . - MRSA swab-negative, U legionella-negative, U Strep Ag, sputum cultures-left. - Was started on steroids before.  However was discontinued by primary team.  Unclear if the patient is in COPD exacerbation as she does not have worsening phlegm or shortness of breath.  Okay to continue to hold. - On triple nebs.  Transition to Trelegy 200 on discharge - Will need a follow-up appointment with Katie Cobb/Dr. Jude on discharge-requested. - Her oxygenation is getting better.  Currently on 3 L.  Diarrhea: - Workup per primary team.  Likely antibiotic induced.  Pulmonary will sign off.  Total care time: 31 minutes   Care time was exclusive of separately billable procedures and treating other patients.  Care was necessary to treat or prevent imminent or life-threatening deterioration.   Care was time spent personally by me on the following activities: development of treatment plan with patient and/or surrogate as well as nursing, discussions with consultants, evaluation of patient's response to treatment, examination of patient, obtaining history from patient or surrogate, ordering and performing treatments and interventions, ordering and review of laboratory studies, ordering and review of radiographic studies and pulse oximetry.   Sammi JONETTA Fredericks, MD Pulmonary, Critical Care and Sleep Attending.   11/11/2024, 12:05 PM

## 2024-11-11 NOTE — Plan of Care (Signed)
  Problem: Clinical Measurements: Goal: Ability to maintain clinical measurements within normal limits will improve Outcome: Progressing   Problem: Education: Goal: Knowledge of General Education information will improve Description: Including pain rating scale, medication(s)/side effects and non-pharmacologic comfort measures Outcome: Progressing   Problem: Safety: Goal: Ability to remain free from injury will improve Outcome: Progressing

## 2024-11-11 NOTE — Plan of Care (Signed)
   Problem: Education: Goal: Knowledge of General Education information will improve Description Including pain rating scale, medication(s)/side effects and non-pharmacologic comfort measures Outcome: Progressing

## 2024-11-11 NOTE — TOC Progression Note (Signed)
 Transition of Care [facility]) - Progression Note    Patient Details  Name: Carol  AYANO [name] MRN: [id number] Date of Birth: 02/01/1936  [facility] CM/SW Contact  Jon ONEIDA Anon, RN Phone Number: 11/11/2024, 3:08 PM  Clinical Narrative:    Pt has home O2 through [facility]. RNCM spoke with Melissa with [employer] and she confirms pt is active with [employer]. Sent over new orders for increase in pt O2 from 2L to 4L O2 [place] continuously. Melissa states will have a travel tank delivered to pt hospital room prior to discharge. ICM following for DC planning needs.   Expected Discharge Plan: Home w Home Health Services Barriers to Discharge: Continued Medical Work up               Expected Discharge Plan and Services   Discharge Planning Services: CM Consult   Living arrangements for the past 2 months: Single Family Home                           HH Arranged: PT HH Agency: [facility] Date HH Agency Contacted: 11/05/24 Time HH Agency Contacted: 1709 Representative spoke with at [facility] Agency: [facility]   Social Drivers of Health (SDOH) Interventions SDOH Screenings   Food Insecurity: No Food Insecurity (11/05/2024)  Housing: Low Risk (11/05/2024)  Transportation Needs: No Transportation Needs (11/05/2024)  Utilities: Not At Risk (11/05/2024)  Tobacco Use: Medium Risk (11/03/2024)    Readmission Risk Interventions    11/05/2024    4:38 PM 11/04/2024    8:43 AM  Readmission Risk Prevention Plan  Transportation Screening Complete Complete  PCP or Specialist Appt within 5-7 Days Complete Complete  Home Care Screening Complete Complete  Medication Review (RN CM) Complete Complete

## 2024-11-11 NOTE — Progress Notes (Signed)
 PROGRESS NOTE  Carol [name]  Carol [name]  FMW:[id number] DOB: March 19, 1936 DOA: 11/03/2024 PCP: Arloa Elsie SAUNDERS, MD   Brief Narrative: Patient is a 88 year old female with history of chronic combined CHF, hypertension, hyperlipidemia, GERD, mood disorder, COPD on oxygen  intermittently at 2 L/min who presented with cough, congestion.  She lives alone.  Also reported confusion on presentation.  Chest imaging on admission showed multifocal pneumonia.  Patient started on IV antibiotics and also on IV steroids for COPD.  Hospital course remarkable for persistent high flow oxygen  requirement of oxygen . Now weaned to 4 L. PCCM also consulted and was following.  New problems diarrhea, likely antibiotic induced.  Assessment & Plan:  Principal Problem:   Community acquired pneumonia   Sepsis secondary to community-acquired pneumonia: Met SIRS criteria with tachypnea, leukocytosis, hypoxia, AKI.  Respiratory viral panel showed rhinovirus.  Chest imaging showed multifocal pneumonia.    Cultures have remained negative so far.  Patient was also seen by speech therapy. Leukocytosis is most likely secondary to steroids, improving now.  Antibiotics discontinued after 8 days  Acute hypoxic respiratory failure: Secondary to pneumonia and COPD exacerbation.  On 2 L of oxygen  intermittently at home. Currently, she is on 4 L.  Her oxygen  saturation may not be accurate because of difficulty in getting accurate reading.  Qualified for 4 L of oxygen  per minute at home  Acute COPD exacerbation: Follows with pulmonology.  She cannot tolerate steroids.  Stopped.  She does not have any wheezing today.  Continue bronchodilators as needed.  She follows with Dr. Jude  Chronic combined systolic/diastolic CHF: Last echo showed EF of 40%, grade 1 diastolic dysfunction.  Appears euvolemic Takes losartan , Aldactone , Jardiance , metoprolol , ivabradine  at home.  Blood pressure stable/soft so most of the medications are on hold  History of  coronary artery disease: No anginal symptoms.  History of hyperlipidemia: On statin at home.  Currently on hold  AKI: Resolved  Iron deficiency anemia/microcytosis: Iron level was undetectable.  Continue oral supplementation.  Vitamin B12 and folate level normal.  Abdominal pain/vomiting/diarrhoea : CT abdomen/showed no acute intra-abdominal findings.  Showed some constipation.  Now having multiple loose bowel movements.  Likely antibiotic induced.  Check GI pathogen panel, C. difficile.  Mouth Lesions: Ordered lidocaine   Protein calorie nutrition/debility/deconditioning: BMI 18.5.  Consulted nutritionist.  PT recommended home health on discharge  Nutrition Problem: Severe Malnutrition Etiology: chronic illness    DVT prophylaxis:enoxaparin  (LOVENOX ) injection 30 mg Start: 11/05/24 2100     Code Status: Limited: Do not attempt resuscitation (DNR) -DNR-LIMITED -Do Not Intubate/DNI   Family Communication: Called and discussed with granddaughter Charmaine on phone on 12/17  Patient status: Patient  Patient is from : Home  Anticipated discharge to: Home with home health  Estimated DC date: 1-2 days   Consultants:PCCM  Procedures: None  Antimicrobials:  Anti-infectives (From admission, onward)    Start     Dose/Rate Route Frequency Ordered Stop   11/04/24 1700  azithromycin  (ZITHROMAX ) 500 mg in sodium chloride  0.9 % 250 mL IVPB  Status:  Discontinued        500 mg 250 mL/hr over 60 Minutes Intravenous Every 24 hours 11/03/24 1630 11/04/24 0943   11/04/24 1600  cefTRIAXone  (ROCEPHIN ) 1 g in sodium chloride  0.9 % 100 mL IVPB  Status:  Discontinued        1 g 200 mL/hr over 30 Minutes Intravenous Every 24 hours 11/03/24 1630 11/04/24 0943   11/04/24 1600  cefTRIAXone  (ROCEPHIN ) 2 g in sodium chloride  0.9 %  100 mL IVPB  Status:  Discontinued        2 g 200 mL/hr over 30 Minutes Intravenous Every 24 hours 11/04/24 0943 11/11/24 0927   11/04/24 1030  azithromycin  (ZITHROMAX )  tablet 500 mg  Status:  Discontinued        500 mg Oral Daily 11/04/24 0943 11/11/24 0927   11/03/24 1545  cefTRIAXone  (ROCEPHIN ) 1 g in sodium chloride  0.9 % 100 mL IVPB        1 g 200 mL/hr over 30 Minutes Intravenous  Once 11/03/24 1542 11/03/24 1715   11/03/24 1545  azithromycin  (ZITHROMAX ) 500 mg in sodium chloride  0.9 % 250 mL IVPB        500 mg 250 mL/hr over 60 Minutes Intravenous  Once 11/03/24 1542 11/03/24 1815       Subjective: Patient seen and examined at bedside today.  Hemodynamically stable.  Overall comfortable but having multiple episodes of loose bowel movements.  Denies abdomen, nausea or vomiting.  Respiratory status stable.  Denies any worsening shortness of breath or cough.  No wheezing today.  On 4 L of oxygen .  Have some whitish oral lesions, mouth pain   Objective: Vitals:   11/10/24 2023 11/11/24 0548 11/11/24 0842 11/11/24 1255  BP:  126/63  125/61  Pulse:  86  91  Resp:  15    Temp:  98 F (36.7 C)  99 F (37.2 C)  TempSrc:    Oral  SpO2: 92% (!) 84% 91% (!) 87%  Weight:      Height:       No intake or output data in the 24 hours ending 11/11/24 1323  Filed Weights   11/03/24 1155  Weight: 43.1 kg    Examination:    General exam: Overall comfortable, not in distress, pleasant elderly female, chronically deconditioned HEENT: PERRL, Whitisn lesions on the mouth, no ulcers Respiratory system:  no wheezes or crackles , mildly diminished air sounds bilaterally Cardiovascular system: S1 & S2 heard, RRR.  Gastrointestinal system: Abdomen is nondistended, soft and nontender. Central nervous system: Alert and oriented Extremities: No edema, no clubbing ,no cyanosis Skin: No rashes, no ulcers,no icterus     Data Reviewed: I have personally reviewed following labs and imaging studies  CBC: Recent Labs  Lab 11/06/24 0527 11/07/24 0604 11/08/24 0703 11/09/24 0443 11/11/24 0855  WBC 9.1 10.1 14.1* 12.3* 14.1*  HGB 13.3 13.4 15.7* 14.3 14.3   HCT 39.9 41.2 46.1* 43.4 43.8  MCV 102.8* 103.8* 102.4* 103.8* 104.0*  PLT 266 283 480* 343 322   Basic Metabolic Panel: Recent Labs  Lab 11/05/24 0550 11/06/24 0527 11/07/24 0604 11/08/24 0703 11/08/24 1912 11/09/24 0443 11/11/24 0855  NA 136 138 137 137  --  137 141  K 4.7 4.8 5.0 6.0* 4.5 4.4 3.9  CL 100 103 103 100  --  102 104  CO2 24 23 23 27   --  25 25  GLUCOSE 110* 128* 97 102*  --  74 91  BUN 40* 40* 35* 36*  --  31* 17  CREATININE 1.06* 0.95 0.76 0.87  --  0.66 0.53  CALCIUM  8.7* 8.9 8.9 9.9  --  9.4 9.2  MG 3.1* 3.3* 3.1* 2.6*  --  2.3  --      Recent Results (from the past 240 hours)  Resp panel by RT-PCR (RSV, Flu A&B, Covid) Anterior Nasal Swab     Status: None   Collection Time: 11/03/24  5:53 PM   Specimen:  Anterior Nasal Swab  Result Value Ref Range Status   SARS Coronavirus 2 by RT PCR NEGATIVE NEGATIVE Final    Comment: (NOTE) SARS-CoV-2 target nucleic acids are NOT DETECTED.  The SARS-CoV-2 RNA is generally detectable in upper respiratory specimens during the acute phase of infection. The lowest concentration of SARS-CoV-2 viral copies this assay can detect is 138 copies/mL. A negative result does not preclude SARS-Cov-2 infection and should not be used as the sole basis for treatment or other patient management decisions. A negative result may occur with  improper specimen collection/handling, submission of specimen other than nasopharyngeal swab, presence of viral mutation(s) within the areas targeted by this assay, and inadequate number of viral copies(<138 copies/mL). A negative result must be combined with clinical observations, patient history, and epidemiological information. The expected result is Negative.  Fact Sheet for Patients:  bloggercourse.com  Fact Sheet for Healthcare Providers:  seriousbroker.it  This test is no t yet approved or cleared by the [place]  FDA and  has  been authorized for detection and/or diagnosis of SARS-CoV-2 by FDA under an Emergency Use Authorization (EUA). This EUA will remain  in effect (meaning this test can be used) for the duration of the COVID-19 declaration under Section 564(b)(1) of the Act, 21 U.S.C.section 360bbb-3(b)(1), unless the authorization is terminated  or revoked sooner.       Influenza A by PCR NEGATIVE NEGATIVE Final   Influenza B by PCR NEGATIVE NEGATIVE Final    Comment: (NOTE) The Xpert Xpress SARS-CoV-2/FLU/RSV plus assay is intended as an aid in the diagnosis of influenza from Nasopharyngeal swab specimens and should not be used as a sole basis for treatment. Nasal washings and aspirates are unacceptable for Xpert Xpress SARS-CoV-2/FLU/RSV testing.  Fact Sheet for Patients: bloggercourse.com  Fact Sheet for Healthcare Providers: seriousbroker.it  This test is not yet approved or cleared by the [place]  FDA and has been authorized for detection and/or diagnosis of SARS-CoV-2 by FDA under an Emergency Use Authorization (EUA). This EUA will remain in effect (meaning this test can be used) for the duration of the COVID-19 declaration under Section 564(b)(1) of the Act, 21 U.S.C. section 360bbb-3(b)(1), unless the authorization is terminated or revoked.     Resp Syncytial Virus by PCR NEGATIVE NEGATIVE Final    Comment: (NOTE) Fact Sheet for Patients: bloggercourse.com  Fact Sheet for Healthcare Providers: seriousbroker.it  This test is not yet approved or cleared by the [place]  FDA and has been authorized for detection and/or diagnosis of SARS-CoV-2 by FDA under an Emergency Use Authorization (EUA). This EUA will remain in effect (meaning this test can be used) for the duration of the COVID-19 declaration under Section 564(b)(1) of the Act, 21 U.S.C. section 360bbb-3(b)(1), unless the  authorization is terminated or revoked.  Performed at [facility], [address], [place], [place] [address]   MRSA Next Gen by PCR, Nasal     Status: Abnormal   Collection Time: 11/09/24  1:05 PM   Specimen: Nasal Mucosa; Nasal Swab  Result Value Ref Range Status   MRSA by PCR Next Gen DETECTED (A) NOT DETECTED Final    Comment: (NOTE) The GeneXpert MRSA Assay (FDA approved for NASAL specimens only), is one component of a comprehensive MRSA colonization surveillance program. It is not intended to diagnose MRSA infection nor to guide or monitor treatment for MRSA infections. Test performance is not FDA approved in patients less than 34 years old. Performed at [facility], [address] [place], [place] [address]   Respiratory (~20 pathogens) panel by PCR     Status: Abnormal   Collection Time: 11/09/24  1:05 PM   Specimen: Nasopharyngeal Swab; Respiratory  Result Value Ref Range Status   Adenovirus NOT DETECTED NOT DETECTED Final   Coronavirus 229E NOT DETECTED NOT DETECTED Final    Comment: (NOTE) The Coronavirus on the Respiratory Panel, DOES NOT test for the novel  Coronavirus (2019 nCoV)    Coronavirus HKU1 NOT DETECTED NOT DETECTED Final   Coronavirus NL63 NOT DETECTED NOT DETECTED Final   Coronavirus OC43 NOT DETECTED NOT DETECTED Final   Metapneumovirus NOT DETECTED NOT DETECTED Final   Rhinovirus / Enterovirus DETECTED (A) NOT DETECTED Final   Influenza A NOT DETECTED NOT DETECTED Final   Influenza B NOT DETECTED NOT DETECTED Final   Parainfluenza Virus 1 NOT DETECTED NOT DETECTED Final   Parainfluenza Virus 2 NOT DETECTED NOT DETECTED Final   Parainfluenza Virus 3 NOT DETECTED NOT DETECTED Final   Parainfluenza Virus 4 NOT DETECTED NOT DETECTED Final   Respiratory Syncytial Virus NOT DETECTED NOT DETECTED Final   Bordetella pertussis NOT DETECTED NOT DETECTED Final   Bordetella Parapertussis NOT DETECTED NOT DETECTED Final    Chlamydophila pneumoniae NOT DETECTED NOT DETECTED Final   Mycoplasma pneumoniae NOT DETECTED NOT DETECTED Final    Comment: Performed at [facility], [address] [place], [place] [address]     Radiology Studies: No results found.  Scheduled Meds:  albuterol   2.5 mg Nebulization BID   arformoterol   15 mcg Nebulization BID   budesonide  (PULMICORT ) nebulizer solution  0.25 mg Nebulization BID   Chlorhexidine  Gluconate Cloth  6 each Topical Daily   dextromethorphan   30 mg Oral BID   enoxaparin  (LOVENOX ) injection  30 mg Subcutaneous Q24H   guaiFENesin   15 mL Oral QID   metoprolol  succinate  50 mg Oral q morning   mirtazapine   30 mg Oral QHS   montelukast   10 mg Oral Daily   multivitamin with minerals  1 tablet Oral Daily   mupirocin  ointment  1 Application Nasal BID   pantoprazole   40 mg Oral BID   raloxifene   60 mg Oral Daily   revefenacin   175 mcg Nebulization Daily   simethicone   80 mg Oral QID   simvastatin   20 mg Oral QHS   venlafaxine  XR  150 mg Oral Q breakfast   Continuous Infusions:     LOS: 8 days   Ivonne Mustache, MD [facility] P12/17/2025, 1:23 PM

## 2024-11-12 LAB — GASTROINTESTINAL PANEL BY PCR, STOOL (REPLACES STOOL CULTURE)

## 2024-11-12 MED ORDER — LORAZEPAM 1 MG PO TABS
1.0000 mg | ORAL_TABLET | Freq: Every evening | ORAL | Status: DC | PRN
  Administered 2024-11-12 – 2024-11-13 (×2): 1 mg via ORAL
  Filled 2024-11-12 (×5): qty 1

## 2024-11-12 MED ORDER — ENOXAPARIN SODIUM 30 MG/0.3ML IJ SOSY
30.0000 mg | PREFILLED_SYRINGE | INTRAMUSCULAR | Status: DC
  Administered 2024-11-12 – 2024-11-13 (×2): 30 mg via SUBCUTANEOUS
  Filled 2024-11-12 (×3): qty 0.3

## 2024-11-12 MED ORDER — GERHARDT'S BUTT CREAM
TOPICAL_CREAM | Freq: Two times a day (BID) | CUTANEOUS | Status: DC
  Filled 2024-11-12: qty 20
  Filled 2024-11-12: qty 60

## 2024-11-12 MED ORDER — FUROSEMIDE 10 MG/ML IJ SOLN
40.0000 mg | Freq: Once | INTRAMUSCULAR | Status: AC
  Administered 2024-11-12: 17:00:00 40 mg via INTRAVENOUS
  Filled 2024-11-12: qty 4

## 2024-11-12 NOTE — Progress Notes (Signed)
 Video call completed with patient, medic Robin, and patient's granddaughter Charmaine. Introduced myself as the [employer] [employer]DR[employer]ER for the evening and reminded them that I am available all night via tablet or phone call if needed. Patient could be found sitting at kitchen table in no acute distress. She denies any pain at this time but does endorse some mouth/ gum soreness. Helped Medic Grayce review scheduled bedtime medications and needed prn's to assist with administration to patient who is ready for bed. Also had medic compare pulse ox reading to current health spo2 data; numbers are comparable around 90% on 5L. All questions answered before ending call.

## 2024-11-12 NOTE — Plan of Care (Signed)

## 2024-11-12 NOTE — Progress Notes (Signed)
 Pt transferring home with hospital home program,VSS, Iv remained in place.   Chinwe Lope C Warden, RN 11/12/2024 1:50 PM

## 2024-11-12 NOTE — Progress Notes (Signed)
 This clinical research associate went to [facility] to see patient for admission questions. Upon arriving, patient was found sitting on the side of the bed, eating her lunch. Patient is A&O x4, was able to verify her address, phone number, emergency contact. This clinical research associate explained and answered questions the patient had. This clinical research associate spoke with the nurse on what to do and how the program works. The patient and nurse agreed and on board with the process. Patient states she has no pets, she would like meals. Patient does not want to harm herself, no thoughts of SI, she feels safe at home and with family. --------------------- Nat SQUIBB. Franchot, EMT

## 2024-11-12 NOTE — Plan of Care (Signed)
°  Problem: Respiratory: Goal: Ability to maintain adequate ventilation will improve Outcome: Progressing Goal: Ability to maintain a clear airway will improve Outcome: Progressing   Problem: Education: Goal: Knowledge of General Education information will improve Description: Including pain rating scale, medication(s)/side effects and non-pharmacologic comfort measures Outcome: Progressing   Problem: Health Behavior/Discharge Planning: Goal: Ability to manage health-related needs will improve Outcome: Progressing   Problem: Clinical Measurements: Goal: Ability to maintain clinical measurements within normal limits will improve Outcome: Progressing Goal: Respiratory complications will improve Outcome: Progressing   Problem: Activity: Goal: Risk for activity intolerance will decrease Outcome: Progressing   Problem: Nutrition: Goal: Adequate nutrition will be maintained Outcome: Progressing   Problem: Pain Managment: Goal: General experience of comfort will improve and/or be controlled Outcome: Progressing   Problem: Skin Integrity: Goal: Risk for impaired skin integrity will decrease Outcome: Progressing

## 2024-11-12 NOTE — Progress Notes (Signed)
 H@H  medic out this afternoon for pt's admission into the [facility] program. On arrival, EMT Garrel and EMT Nat along with Paramedic Barnie were on scene setting up the [facility] equipment in the patients home and transferring the pt from the portable oxygen  concentrator onto the home one. On assessment, lung sounds diminished in all fields, heart rate S1, S2, pulses strong and regular, abdomen soft non-tender x4 quadrants, tachypneic without accessory muscle use, and no edema noted on exam. At this time the patient has no complaints. The patients oxygen  was titrated from 4L to 5L due to low oxygen  saturations. Pt has no complaints of shob. Vitals were taken and updated. Skin assessment was performed with RN Shanda. The patient had redness on her buttocks. Pictures were updated in the chart and a new dressing was placed. A one time dose of lasix  was given as ordered. Meds were sorted in appropriate bin spaces with BID and TID meds left on top of the medications bins. Education was provided on use of the armband and tablet as well as the H@H  program. Any and all questions and concerns were answered at this time. No other tasks were needed at this time. The patients granddaughter will call in to the nurse tonight for bedtime meds. H@H  admit complete.

## 2024-11-12 NOTE — Plan of Care (Signed)
 Hospital at Home Interim Note     Carol [name]  Carol [name]   FMW:[id number]  DOB: September 05, 1936  DOA: 11/03/2024     9 Date of Service: 11/12/2024   Brief Narrative: Patient is a 88 year old female with history of chronic combined CHF, hypertension, hyperlipidemia, GERD, mood disorder, COPD on oxygen  intermittently at 2 L/min who presented with cough, congestion.  She lives alone.  Also reported confusion on presentation.  Chest imaging on admission showed multifocal pneumonia.  Patient started on IV antibiotics and also on IV steroids for COPD.  Hospital course remarkable for persistent high flow oxygen  requirement of oxygen . Now weaned to 4 L. PCCM also consulted and was following. She developed diarrhea during this hospitalization, now slowing down. Afterlegthy discussion with patient and granddaughter Charmaine. Family in agreement to the Hospital at Home program.   Subjective:  Overall resp status improved- now on 4L [place] O2 chronically vs. 2L [place] prior to admission. Appreciate pulmonary input and recommendations.  Noted multiple episodes of diarrhea yesterday. Improving, C diff negative. GI panel pending. Holding offending agents including antibiotics and laxatives. Started on prn anti-diarrheal medication with some symptomatic improvement. No reported abdominal pain, nausea or vomiting.  Hospital Problems Assessment and Plan: No notes have been filed under this hospital service. Service: Hospitalist Acute on chronic resp failure w/ hypoxia  CAP  Rhinovirus  COPD Exacerbation  Noted initial presentation w/ cough, congestion and mild encephalopathy  Baseline 2L [place] use at home-now requiring 4L [place] to maintain O2 sats >92%  Rhinovirus +  PCCM iniital consulted- s/p 8 day course of antibiotics.  Noted intolerance to steroids  Cont bronchodilator therapy  Stable from a resp standpoint at present   Diarrhea:  Noted multiple loose/runny BMs over the past 2 days  Felt to be antibiotic related s/p  dc  C diff negative  Formal GI panel pending  No active abdominal pain at present  Prn lomotil added   Chronic combined systolic/diastolic CHF: Last echo 12/2022 showed EF of 40%, grade 1 diastolic dysfunction.  Appears euvolemic Takes losartan , Aldactone , Jardiance , metoprolol , ivabradine  at home.   SBP in 100s-130s  Further titrate medications as appropriate    History of coronary artery disease: No anginal symptoms.   History of hyperlipidemia: On statin at home.  Currently on hold   AKI: Resolved   Iron deficiency anemia/microcytosis: Hgb stable at 14.  Iron level was undetectable.  Continue oral supplementation.  Vitamin B12 and folate level normal.   Abdominal pain/vomiting/diarrhoea : Resolved. CT abdomen/showed no acute intra-abdominal findings on admission.  Showed some constipation. Now w/ diarrhea-likely antibiotic associated as noted above    Mouth Lesions: Ordered lidocaine . Clinically improved.    Protein calorie nutrition/debility/deconditioning: BMI 18.5.  Consulted nutritionist.  PT recommended home health on discharge   Nutrition Problem: Severe Malnutrition Etiology: chronic illness   DVT prophylaxis:enoxaparin  (LOVENOX ) injection 30 mg Start: 11/12/24 1145       Code Status: Limited: Do not attempt resuscitation (DNR) -DNR-LIMITED -Do Not Intubate/DNI      Objective Vital signs were reviewed and unremarkable. Vitals:   11/12/24 0516 11/12/24 0739 11/12/24 1425 11/12/24 1445  BP: 117/67  133/84   Pulse: 96  (!) 104   Temp: 98.4 F (36.9 C)     Resp: 19  (!) 26   Height:      Weight:      SpO2: 92% 93% (!) 86% 92%  TempSrc: Oral  Oral   BMI (Calculated):  Exam Physical Exam Constitutional:      Comments: Underweight    HENT:     Head: Normocephalic and atraumatic.     Nose: Nose normal.  Eyes:     Pupils: Pupils are equal, round, and reactive to light.  Cardiovascular:     Rate and Rhythm: Normal rate and regular rhythm.   Pulmonary:     Effort: Pulmonary effort is normal.  Abdominal:     General: Bowel sounds are normal.  Skin:    General: Skin is warm.  Neurological:     General: No focal deficit present.  Psychiatric:        Mood and Affect: Mood normal.      Labs / Other Information There are no new results to review at this time.  Hospital at Home Admission Criteria Checklist:  Formal consent explained in detail and signed at the bedside: yes Patient meets inpatient admission criteria (see below for further details) yes Is pt Medicare FFS/[employer] Medicare-Medicaid, [employer], [employer] Medicare, [employer], [employer] ( required for initial launch with plan to expand)? yes Lives within 25 mil/ 30 min from [facility] within [place](pt may stay with family member during admission who lives within 25 miles or 30 min from [facility] w/in [facility])? yes Hemodynamically stable with relatively low risk of clinical deterioration-not requiring ICU? yes Age >55? yes Does not require frequent touch-points or complex interventions/medications (ie Titrated Infusions (IV insulin, heparin drips, vasoactive drips, use of infused or injectable controlled substances, patients on insulin)? no Any Behavioral Health comorbidities likely to increase risk for in-home care (ie Acute delirium or experiencing a marked altered mental status and cause is not a treatable condition in the home)? no Has the patient been on BIPAP during course of ED evaluation or hospitalization? no IF YES, Has the patient been off of BIPAP for >24 hours(If NO-THEN PATIENT DOES MEET INCLUSION CRITERIA)? no On Room Air or Needs oxygen  at home (<6L)? On 4L [place]  Active safety concerns (ie Unable to use bedside commode independently and lacks caregiver support for safety- needs SNF placement, unable to obtain IV access)? no Has skin check been performed? yes  Has Physical Therapy screened the patient? yes  Common admission diagnoses  including: CAP, COPD Exacerbation, Acute on chronic heart failure, Cellulitis, UTI , dehydration, acute resp failure with hypoxia (requiring <5L)   Social Screening:  - Has the family been directly contacted about Hospital at Home program with consent obtained (if yes- please document who was spoken to with name and phone number)? yes  -Was the family approached about the use of TOC pharmacy for medications at discharge? no Denies significant ETOH intake? not applicable Does not smoke and understands may not smoke in the presence of oxygen ? yes Patient states able to use iPad/phone for communication/has family who is able to use? yes Patient has agreed to be compliant with medication and treatment regimen of the program? yes Any active drug use in patient or primary caregiver including daily dosing of methadone? no Stable home environment ( access to appropriate heating in cold conditions and/or appropriate air conditioning in hot conditions and/or no running water/electricity)? yes  No aggressive pets at home? no Firearm present? no  With ability or willingness to store them unloaded in a locked case for duration of hospitalization? no Ambulatory? yes  mild difficulty Bed bugs present on home evaluation? no Family support system in place? yes Patient feels safe at home and does not endorse any violence? yes  Any actively decompensated behavioral health issues including agitation/aggressive behavior? no  Patient requests food to be provided by hospital home program? yes PT/OT eval completed and not requiring SNF, ALF, inpatient rehab? yes  To be admitted to the Hospital at [facility] program, a patient generally must meet the following: 1. Requirement for Inpatient Level of Care: The patient's condition must necessitate an inpatient level of care. This is typically indicated by one or more of the following, depending on their specific diagnosis:  Persistent tachycardia despite appropriate treatment  (e.g., for Heart Failure, UTI). Persistent tachypnea (rapid breathing) or dyspnea (shortness of breath) that hasn't improved sufficiently with observation care (e.g., for Heart Failure, Pneumonia, Viral Illness, COVID). Hypoxemia (low oxygen  levels), such as a new need for oxygen , an increased need from baseline, or specific oxygen  saturation levels (e.g., SpO2 <90-94% depending on the condition) that persist despite observation (e.g., for Heart Failure, COPD, Pneumonia, Viral Illness, COVID). Need for Intravenous (IV) hydration due to an inability to maintain oral hydration, which persists despite observation care (e.g., for Cellulitis, UTI, Viral Illness, COVID). Specific to Heart Failure: Persistent pulmonary edema, indicated by a new oxygen  need, lack of improvement with IV diuretics, and ongoing tachypnea/dyspnea. Specific to COPD: A decrease in known baseline resting oxygen  saturation (SpO2) by 4% or more, or an increase in pre-existing supplemental oxygen  requirements, which persists despite observation and requires continued close monitoring. Specific to Pneumonia: A Pneumonia Severity Index (PSI) class IV (moderate risk). Specific to Cellulitis: Failure of outpatient antibiotic therapy (indicated by progression or no improvement after a minimum of 48 hours on an adequate regimen) or a clinical presentation (like acuity or rapidity of progression) that requires the intensity of monitoring found in an inpatient setting. Specific to UTI: Persistence or worsening of clinical findings like fever, pain, or dehydration despite observation care; presence of significant uropathy; suspected infection of an indwelling prosthetic device, stent, implant, or graft; or pregnancy with suspected pyelonephritis.  2. Appropriateness for Hospital at Home Setting: The patient's overall clinical picture, including the severity of their illness, their care needs, and their medical history and comorbidities, must be  suitable for management in the Hospital at Home environment. This essentially means that none of the exclusion criteria (listed below) are met.  Unified Exclusion Criteria for Hospital at Home Admission: A patient would not be eligible for Hospital at Home if any of the following are present: Hemodynamic Instability: Hypotension (low blood pressure) is present. Respiratory Instability or Needs Beyond Program Capability: There is a new need for invasive or noninvasive ventilatory assistance (like BiPAP or a ventilator). Oxygenation is not sufficient, generally indicated if an FiO2 (fraction of inspired oxygen ) of 45% (which is about 6 Liters/minute via nasal cannula) or more is required to keep oxygen  saturation (SpO2) at 90% or greater. Monitoring or Procedural Needs Beyond Program Capability: There is a need for invasive monitoring, such as a pulmonary artery catheter or an arterial line. There is a need for immediate-response telemetry monitoring (for dangerous arrhythmia detection and subsequent immediate intervention). The required medication regimen is beyond the capabilities of Hospital at Home (e.g., dosing intervals are too frequent for home administration). There is a need for a procedure that cannot be performed by the Hospital at [facility] team (e.g., significant wound debridement or abscess drainage for cellulitis, or percutaneous nephrostomy for a complicated UTI). Significant Organ Dysfunction or Markers of Severe Illness: Mental status is not at baseline, or there is altered mental status suggestive of inadequate perfusion. Renal (kidney)  function is unstable or showing an ongoing decline. There is evidence of inadequate perfusion, such as metabolic acidosis or myocardial ischemia. Uncompensated acidosis is present. Condition-Specific Severity or Complications Making Home Care Unsuitable: For Heart Failure: Known severe cardiac valvular disease (e.g., aortic stenosis, mitral  regurgitation); or severe peripheral edema that impairs the ability to urinate or ambulate. For COPD: Known concurrent comorbidity or finding that indicates a higher-risk COPD exacerbation (e.g., pulmonary fibrosis, cavitation, pleural effusion, pneumothorax, rib fracture). For Pneumonia: Pneumonia Severity Index (PSI) class V (indicating high risk for inpatient mortality); known concurrent comorbidity or finding that indicates higher-risk pneumonia (e.g., pulmonary fibrosis, cavitation, large or loculated pleural effusion); or a concomitant serious infectious process like endocarditis or empyema. For Cellulitis: Orbital, periorbital, or necrotizing infection is suspected; or a concomitant serious infectious process like endocarditis, septic emboli, or septic joint space infection. For UTI: Urinary tract obstruction (e.g., kidney stone, bladder outlet obstruction); or a concomitant serious infectious process like endocarditis or septic emboli. For Viral Illness & COVID-19: A concomitant serious infectious process like endocarditis or empyema.  General Comorbidities or Status:  The patient is significantly immunosuppressed (this applies to Pneumonia, Cellulitis, UTI, Viral Illness, and COVID-19). The patient meets inpatient admission criteria for a second diagnosis, or has care needs beyond the capabilities of Hospital at Home due to an active clinically significant comorbidity. (This is a general exclusion across all listed conditions)   Time spent: >60 minutes  [facility] 11/12/2024, 11:57 AM

## 2024-11-12 NOTE — Progress Notes (Signed)
 This clinical research associate went [facility] [address] stopped at the nurses station, retrieved patients shadow chart and notified them the patient was being taken home  to enter in the Hospital at [facility] program. This writer went to room 1504, patient was sitting on the side of the bed, fully dressed. Patient was happy to see the staff. The Virtual Nurse, Shanda talked with he patients family about coming to the hospital to retrieve the patients belongings, reported this information to Nurse, Natalie and the patient. This clinical research associate transferred O2 from Hospital to Hospital at [place] portable tank. Patient stood and pivoted and sat down in our wheelchair. Patients purse, coat and 1 bag of belongings were transported with the patient. (Leaving behind flowers for the family to pick up) Patient was pushed in a wheelchair to the transport van, patient loaded and secured via 2 front floor hooks, 2 wheelchair locks, 2 rear floor hooks, shoulder/lap seatbelt. Patient was transferred home. Patient was taken into the home via wheelchair. Patient took 3 steps and sat in her chair. This clinical research associate along with EMT, Garrel, Paramedic, Barnie and Tyrell set up equipment. Patients vital signs were obtained, loaded into EPIC under the vital sign tab. This clinical research associate hung patients signed DNR form on the refrigerator and explained to the grand daughter, Charmaine the document needed ot stay there and be accessible if patient needed to call 911. This clinical research associate and EMT, Garrel left the patients house.  ------------------------ Nat SQUIBB. Franchot, EMT

## 2024-11-12 NOTE — Progress Notes (Signed)
 PROGRESS NOTE  Carol  ZAMARA [name]  FMW:[id number] DOB: May 26, 1936 DOA: 11/03/2024 PCP: Arloa Elsie SAUNDERS, MD   Brief Narrative: Patient is a 88 year old female with history of chronic combined CHF, hypertension, hyperlipidemia, GERD, mood disorder, COPD on oxygen  intermittently at 2 L/min who presented with cough, congestion.  She lives alone.  Also reported confusion on presentation.  Chest imaging on admission showed multifocal pneumonia.  Patient started on IV antibiotics and also on IV steroids for COPD.  Hospital course remarkable for persistent high flow oxygen  requirement of oxygen . Now weaned to 4 L. PCCM also consulted and was following. She developed diarrhea during this hospitalization, now slowing down.  Assessment & Plan:  Principal Problem:   Community acquired pneumonia   Sepsis secondary to community-acquired pneumonia: Met SIRS criteria with tachypnea, leukocytosis, hypoxia, AKI.  Respiratory viral panel showed rhinovirus.  Chest imaging showed multifocal pneumonia.    Cultures have remained negative so far.  Patient was also seen by speech therapy. Leukocytosis is most likely secondary to steroids.  Antibiotics discontinued after 8 days  Acute hypoxic respiratory failure: Secondary to pneumonia and COPD exacerbation.  On 2 L of oxygen  intermittently at home. Currently, she is on 4 L.  Her oxygen  saturation may not be accurate because of difficulty in getting accurate reading.  Qualified for 4 L of oxygen  per minute at home  Acute COPD exacerbation: Follows with pulmonology.  She cannot tolerate steroids.  Stopped.  She does not have any wheezing today.  Continue bronchodilators as needed.  She follows with Dr. Jude  Chronic combined systolic/diastolic CHF: Last echo showed EF of 40%, grade 1 diastolic dysfunction.  Appears euvolemic Takes losartan , Aldactone , Jardiance , metoprolol , ivabradine  at home.  Blood pressure stable/soft so most of the medications are on  hold  History of coronary artery disease: No anginal symptoms.  History of hyperlipidemia: On statin at home.  Currently on hold  AKI: Resolved  Iron deficiency anemia/microcytosis: Iron level was undetectable.  Continue oral supplementation.  Vitamin B12 and folate level normal.  Abdominal pain/vomiting/diarrhoea : CT abdomen/showed no acute intra-abdominal findings.  Showed some constipation.  Started having multiple loose bowel movements.  Likely antibiotic induced.  Negative C diff. Pending GI pathogen panel  Mouth Lesions: Ordered lidocaine .  Improved pain today  Protein calorie nutrition/debility/deconditioning: BMI 18.5.  Consulted nutritionist.  PT recommended home health on discharge  Nutrition Problem: Severe Malnutrition Etiology: chronic illness    DVT prophylaxis:enoxaparin  (LOVENOX ) injection 30 mg Start: 11/12/24 1145     Code Status: Limited: Do not attempt resuscitation (DNR) -DNR-LIMITED -Do Not Intubate/DNI   Family Communication: Called and discussed with granddaughter Charmaine on phone on 12/17  Patient status: Patient  Patient is from : Home  Anticipated discharge to: Home with home health  Estimated DC date: 1-2 days   Consultants:PCCM  Procedures: None  Antimicrobials:  Anti-infectives (From admission, onward)    Start     Dose/Rate Route Frequency Ordered Stop   11/04/24 1700  azithromycin  (ZITHROMAX ) 500 mg in sodium chloride  0.9 % 250 mL IVPB  Status:  Discontinued        500 mg 250 mL/hr over 60 Minutes Intravenous Every 24 hours 11/03/24 1630 11/04/24 0943   11/04/24 1600  cefTRIAXone  (ROCEPHIN ) 1 g in sodium chloride  0.9 % 100 mL IVPB  Status:  Discontinued        1 g 200 mL/hr over 30 Minutes Intravenous Every 24 hours 11/03/24 1630 11/04/24 0943   11/04/24 1600  cefTRIAXone  (ROCEPHIN ) 2  g in sodium chloride  0.9 % 100 mL IVPB  Status:  Discontinued        2 g 200 mL/hr over 30 Minutes Intravenous Every 24 hours 11/04/24 0943 11/11/24  0927   11/04/24 1030  azithromycin  (ZITHROMAX ) tablet 500 mg  Status:  Discontinued        500 mg Oral Daily 11/04/24 0943 11/11/24 0927   11/03/24 1545  cefTRIAXone  (ROCEPHIN ) 1 g in sodium chloride  0.9 % 100 mL IVPB        1 g 200 mL/hr over 30 Minutes Intravenous  Once 11/03/24 1542 11/03/24 1715   11/03/24 1545  azithromycin  (ZITHROMAX ) 500 mg in sodium chloride  0.9 % 250 mL IVPB        500 mg 250 mL/hr over 60 Minutes Intravenous  Once 11/03/24 1542 11/03/24 1815       Subjective: Patient seen and examined at the bedside today.  She looks comfortable this morning.  On 4 L of oxygen .  Denied any shortness of breath or cough.  Diarrhea has slowed down.  She had 1 episode of diarrhea yesterday evening and today morning.  Abdomen is soft and nontender.  Mouth pain has improved.  Objective: Vitals:   11/11/24 2007 11/11/24 2026 11/12/24 0516 11/12/24 0739  BP:  116/66 117/67   Pulse:  93 96   Resp:  16 19   Temp:  98.5 F (36.9 C) 98.4 F (36.9 C)   TempSrc:  Oral Oral   SpO2: 92% 90% 92% 93%  Weight:      Height:        Intake/Output Summary (Last 24 hours) at 11/12/2024 1129 Last data filed at 11/12/2024 0830 Gross per 24 hour  Intake 625 ml  Output 0 ml  Net 625 ml    Filed Weights   11/03/24 1155  Weight: 43.1 kg    Examination:   General exam: Overall comfortable, not in distress, pleasant elderly female, chronically deconditioned HEENT: PERRL Respiratory system:  no wheezes or crackles, diminished sounds bilaterally Cardiovascular system: S1 & S2 heard, RRR.  Gastrointestinal system: Abdomen is nondistended, soft and nontender. Central nervous system: Alert and oriented Extremities: No edema, no clubbing ,no cyanosis Skin: No rashes, no ulcers,no icterus     Data Reviewed: I have personally reviewed following labs and imaging studies  CBC: Recent Labs  Lab 11/06/24 0527 11/07/24 0604 11/08/24 0703 11/09/24 0443 11/11/24 0855  WBC 9.1 10.1 14.1*  12.3* 14.1*  HGB 13.3 13.4 15.7* 14.3 14.3  HCT 39.9 41.2 46.1* 43.4 43.8  MCV 102.8* 103.8* 102.4* 103.8* 104.0*  PLT 266 283 480* 343 322   Basic Metabolic Panel: Recent Labs  Lab 11/06/24 0527 11/07/24 0604 11/08/24 0703 11/08/24 1912 11/09/24 0443 11/11/24 0855  NA 138 137 137  --  137 141  K 4.8 5.0 6.0* 4.5 4.4 3.9  CL 103 103 100  --  102 104  CO2 23 23 27   --  25 25  GLUCOSE 128* 97 102*  --  74 91  BUN 40* 35* 36*  --  31* 17  CREATININE 0.95 0.76 0.87  --  0.66 0.53  CALCIUM  8.9 8.9 9.9  --  9.4 9.2  MG 3.3* 3.1* 2.6*  --  2.3  --      Recent Results (from the past 240 hours)  Resp panel by RT-PCR (RSV, Flu A&B, Covid) Anterior Nasal Swab     Status: None   Collection Time: 11/03/24  5:53 PM   Specimen:  Anterior Nasal Swab  Result Value Ref Range Status   SARS Coronavirus 2 by RT PCR NEGATIVE NEGATIVE Final    Comment: (NOTE) SARS-CoV-2 target nucleic acids are NOT DETECTED.  The SARS-CoV-2 RNA is generally detectable in upper respiratory specimens during the acute phase of infection. The lowest concentration of SARS-CoV-2 viral copies this assay can detect is 138 copies/mL. A negative result does not preclude SARS-Cov-2 infection and should not be used as the sole basis for treatment or other patient management decisions. A negative result may occur with  improper specimen collection/handling, submission of specimen other than nasopharyngeal swab, presence of viral mutation(s) within the areas targeted by this assay, and inadequate number of viral copies(<138 copies/mL). A negative result must be combined with clinical observations, patient history, and epidemiological information. The expected result is Negative.  Fact Sheet for Patients:  bloggercourse.com  Fact Sheet for Healthcare Providers:  seriousbroker.it  This test is no t yet approved or cleared by the [place]  FDA and  has been  authorized for detection and/or diagnosis of SARS-CoV-2 by FDA under an Emergency Use Authorization (EUA). This EUA will remain  in effect (meaning this test can be used) for the duration of the COVID-19 declaration under Section 564(b)(1) of the Act, 21 U.S.C.section 360bbb-3(b)(1), unless the authorization is terminated  or revoked sooner.       Influenza A by PCR NEGATIVE NEGATIVE Final   Influenza B by PCR NEGATIVE NEGATIVE Final    Comment: (NOTE) The Xpert Xpress SARS-CoV-2/FLU/RSV plus assay is intended as an aid in the diagnosis of influenza from Nasopharyngeal swab specimens and should not be used as a sole basis for treatment. Nasal washings and aspirates are unacceptable for Xpert Xpress SARS-CoV-2/FLU/RSV testing.  Fact Sheet for Patients: bloggercourse.com  Fact Sheet for Healthcare Providers: seriousbroker.it  This test is not yet approved or cleared by the [place]  FDA and has been authorized for detection and/or diagnosis of SARS-CoV-2 by FDA under an Emergency Use Authorization (EUA). This EUA will remain in effect (meaning this test can be used) for the duration of the COVID-19 declaration under Section 564(b)(1) of the Act, 21 U.S.C. section 360bbb-3(b)(1), unless the authorization is terminated or revoked.     Resp Syncytial Virus by PCR NEGATIVE NEGATIVE Final    Comment: (NOTE) Fact Sheet for Patients: bloggercourse.com  Fact Sheet for Healthcare Providers: seriousbroker.it  This test is not yet approved or cleared by the [place]  FDA and has been authorized for detection and/or diagnosis of SARS-CoV-2 by FDA under an Emergency Use Authorization (EUA). This EUA will remain in effect (meaning this test can be used) for the duration of the COVID-19 declaration under Section 564(b)(1) of the Act, 21 U.S.C. section 360bbb-3(b)(1), unless the  authorization is terminated or revoked.  Performed at [facility], [address], [place], [place] [address]   MRSA Next Gen by PCR, Nasal     Status: Abnormal   Collection Time: 11/09/24  1:05 PM   Specimen: Nasal Mucosa; Nasal Swab  Result Value Ref Range Status   MRSA by PCR Next Gen DETECTED (A) NOT DETECTED Final    Comment: (NOTE) The GeneXpert MRSA Assay (FDA approved for NASAL specimens only), is one component of a comprehensive MRSA colonization surveillance program. It is not intended to diagnose MRSA infection nor to guide or monitor treatment for MRSA infections. Test performance is not FDA approved in patients less than 2 years old. Performed at [facility], [address] [place], [place] [address]   Respiratory (~20 pathogens) panel by PCR     Status: Abnormal   Collection Time: 11/09/24  1:05 PM   Specimen: Nasopharyngeal Swab; Respiratory  Result Value Ref Range Status   Adenovirus NOT DETECTED NOT DETECTED Final   Coronavirus 229E NOT DETECTED NOT DETECTED Final    Comment: (NOTE) The Coronavirus on the Respiratory Panel, DOES NOT test for the novel  Coronavirus (2019 nCoV)    Coronavirus HKU1 NOT DETECTED NOT DETECTED Final   Coronavirus NL63 NOT DETECTED NOT DETECTED Final   Coronavirus OC43 NOT DETECTED NOT DETECTED Final   Metapneumovirus NOT DETECTED NOT DETECTED Final   Rhinovirus / Enterovirus DETECTED (A) NOT DETECTED Final   Influenza A NOT DETECTED NOT DETECTED Final   Influenza B NOT DETECTED NOT DETECTED Final   Parainfluenza Virus 1 NOT DETECTED NOT DETECTED Final   Parainfluenza Virus 2 NOT DETECTED NOT DETECTED Final   Parainfluenza Virus 3 NOT DETECTED NOT DETECTED Final   Parainfluenza Virus 4 NOT DETECTED NOT DETECTED Final   Respiratory Syncytial Virus NOT DETECTED NOT DETECTED Final   Bordetella pertussis NOT DETECTED NOT DETECTED Final   Bordetella Parapertussis NOT DETECTED NOT DETECTED Final    Chlamydophila pneumoniae NOT DETECTED NOT DETECTED Final   Mycoplasma pneumoniae NOT DETECTED NOT DETECTED Final    Comment: Performed at [facility], [address] [place], [place] [address]  C Difficile Quick Screen (NO PCR Reflex)     Status: None   Collection Time: 11/11/24  6:20 PM   Specimen: STOOL  Result Value Ref Range Status   C Diff antigen NEGATIVE NEGATIVE Final   C Diff toxin NEGATIVE NEGATIVE Final   C Diff interpretation No C. difficile detected.  Final    Comment: Performed at [facility], [address] [place], [place] [address]     Radiology Studies: No results found.  Scheduled Meds:  albuterol   2.5 mg Nebulization BID   arformoterol   15 mcg Nebulization BID   budesonide  (PULMICORT ) nebulizer solution  0.25 mg Nebulization BID   dextromethorphan   30 mg Oral BID   enoxaparin  (LOVENOX ) injection  30 mg Subcutaneous Q24H   guaiFENesin   15 mL Oral QID   metoprolol  succinate  50 mg Oral q morning   mirtazapine   30 mg Oral QHS   montelukast   10 mg Oral Daily   multivitamin with minerals  1 tablet Oral Daily   mupirocin  ointment  1 Application Nasal BID   pantoprazole   40 mg Oral BID   raloxifene   60 mg Oral Daily   revefenacin   175 mcg Nebulization Daily   simethicone   80 mg Oral QID   simvastatin   20 mg Oral QHS   venlafaxine  XR  150 mg Oral Q breakfast   Continuous Infusions:     LOS: 9 days   Ivonne Mustache, MD [facility] P12/18/2025, 11:29 AM

## 2024-11-12 NOTE — Progress Notes (Signed)
 Patient transferred to the [facility] program. Communicated with patient via video tablet. AAOx4. Plan of care reviewed with patient and granddaughter, Charmaine. Patient was told that the virtual nurse is available 24/7. [facility] phone number given to the patient. Tablet usage explained. Medication reconciliation and skin check completed with Tyrell, paramedic.

## 2024-11-13 DIAGNOSIS — J432 Centrilobular emphysema: Secondary | ICD-10-CM

## 2024-11-13 DIAGNOSIS — E782 Mixed hyperlipidemia: Secondary | ICD-10-CM

## 2024-11-13 DIAGNOSIS — I110 Hypertensive heart disease with heart failure: Secondary | ICD-10-CM

## 2024-11-13 DIAGNOSIS — F419 Anxiety disorder, unspecified: Secondary | ICD-10-CM

## 2024-11-13 DIAGNOSIS — I5020 Unspecified systolic (congestive) heart failure: Secondary | ICD-10-CM

## 2024-11-13 DIAGNOSIS — R197 Diarrhea, unspecified: Secondary | ICD-10-CM

## 2024-11-13 DIAGNOSIS — K219 Gastro-esophageal reflux disease without esophagitis: Secondary | ICD-10-CM

## 2024-11-13 DIAGNOSIS — R0989 Other specified symptoms and signs involving the circulatory and respiratory systems: Secondary | ICD-10-CM

## 2024-11-13 LAB — COMPREHENSIVE METABOLIC PANEL WITH GFR
ALT: 24 U/L (ref 0–44)
AST: 24 U/L (ref 15–41)
Albumin: 3.3 g/dL — ABNORMAL LOW (ref 3.5–5.0)
Alkaline Phosphatase: 50 U/L (ref 38–126)
Anion gap: 14 (ref 5–15)
BUN: 21 mg/dL (ref 8–23)
CO2: 27 mmol/L (ref 22–32)
Calcium: 9.2 mg/dL (ref 8.9–10.3)
Chloride: 99 mmol/L (ref 98–111)
Creatinine, Ser: 0.62 mg/dL (ref 0.44–1.00)
GFR, Estimated: >60 mL/min
Glucose, Bld: 58 mg/dL — ABNORMAL LOW (ref 70–99)
Potassium: 3.5 mmol/L (ref 3.5–5.1)
Sodium: 140 mmol/L (ref 135–145)
Total Bilirubin: 0.6 mg/dL (ref 0.0–1.2)
Total Protein: 6.5 g/dL (ref 6.5–8.1)

## 2024-11-13 MED ORDER — EMPAGLIFLOZIN 10 MG PO TABS
10.0000 mg | ORAL_TABLET | Freq: Every day | ORAL | Status: DC
  Administered 2024-11-13 – 2024-11-14 (×2): 10 mg via ORAL
  Filled 2024-11-13 (×3): qty 1

## 2024-11-13 MED ORDER — DEXTROSE 50 % IV SOLN
1.0000 | Freq: Once | INTRAVENOUS | Status: DC | PRN
  Filled 2024-11-13: qty 50

## 2024-11-13 MED ORDER — IVABRADINE HCL 5 MG PO TABS
5.0000 mg | ORAL_TABLET | Freq: Two times a day (BID) | ORAL | Status: DC
  Administered 2024-11-13 – 2024-11-14 (×3): 5 mg via ORAL
  Filled 2024-11-13 (×5): qty 1

## 2024-11-13 NOTE — Progress Notes (Signed)
 On  virtual call with Carol [name] and grand daughter, patient identification completed, provider, and field team.  Patient and grand daughter self administered morning meds. Patient refused cough medicine both robitussin and delsym . Informed if she feels she needs them later we can administer later. Patient completed all neb treatments and reviewed breathing techniques for optimal dosing.  Productive cough clear and not viscus. Educated about skin care, hydration, and neb treatments  Difficulty getting accurate O2  No signs of cyanosis inner lip or inner lower lid per medic. Grand daughter shared at the hospital the same issue of difficulty getting good O2 reading patient is conversing laughing and denies distress. Pulmonologist note she has poor periferal circulation  at baseline.   Provider is resuming patients jardiance  and Corlanor  today after needed documentation is submitted. [id number] current health alarm answered patient denies any distress speaking in full sentences.

## 2024-11-13 NOTE — TOC Progression Note (Signed)
 Transition of Care [facility]) - Progression Note    Patient Details  Name: Carol  AARIYANA [name] MRN: [id number] Date of Birth: October 08, 1936  [facility] of [facility] CM/SW Contact  Debarah Saunas, RN Phone Number: 11/13/2024, 8:46 AM  Clinical Narrative:    Plan to DC home today with oxygen  flow increase.  RNCM to communicate new oxygen  requirements to DME agency.    Expected Discharge Plan: Home w Home Health Services Barriers to Discharge: Continued Medical Work up               Expected Discharge Plan and Services   Discharge Planning Services: CM Consult   Living arrangements for the past 2 months: Single Family Home                           HH Arranged: PT HH Agency: [facility] Date HH Agency Contacted: 11/05/24 Time HH Agency Contacted: 1709 Representative spoke with at [facility] Agency: [facility]   Social Drivers of Health (SDOH) Interventions SDOH Screenings   Food Insecurity: No Food Insecurity (11/05/2024)  Housing: Low Risk (11/05/2024)  Transportation Needs: No Transportation Needs (11/05/2024)  Utilities: Not At Risk (11/05/2024)  Tobacco Use: Medium Risk (11/03/2024)    Readmission Risk Interventions    11/05/2024    4:38 PM 11/04/2024    8:43 AM  Readmission Risk Prevention Plan  Transportation Screening Complete Complete  PCP or Specialist Appt within 5-7 Days Complete Complete  Home Care Screening Complete Complete  Medication Review (RN CM) Complete Complete

## 2024-11-13 NOTE — Assessment & Plan Note (Signed)
 Metoprolol  succinate 50 mg p.o. every morning

## 2024-11-13 NOTE — Progress Notes (Signed)
 Dual signed with medic  following meds Zocor  30mg , and Corlandor 5mg  both PO.

## 2024-11-13 NOTE — Progress Notes (Addendum)
 Pt seen for routine [facility] evening visit. Pt appears well and was lying on the couch on arrival. Pt's granddaughter present. Pt denies any complaints at this time and states she took a pretty good nap today.   Vital signs and assessment obtained as noted.  Medications administered as noted.   IV care completed including new curos cap.  IMM letter signed at 18:14.   Pt encouraged to call with any problems and questions tonight.

## 2024-11-13 NOTE — Assessment & Plan Note (Signed)
 Home venlafaxine  150 mg daily with breakfast, mirtazapine  30 mg nightly were resumed on admission

## 2024-11-13 NOTE — Progress Notes (Signed)
 Pt seen for routine [facility] morning visit. Pt appears well and is doing a neb treatment with Darice, RN virtually, as we arrive. Pt's granddaughter is present. Pt slept well and states she feels better than yesterday but is ready to eat breakfast.   Vital signs and assessment obtained as noted. Pt's lungs are clear but diminished in all fields. Pt's heart tones are s1, s2 with strong regular pulses. No edema noted. Pt's bowel sounds are active and abdomen is soft and non-tender in all quadrants. Photo of Pt's sacral area uploaded to Epic and gerhardt's applied as well as sacral Mepilex.  Unable to get completely accurate spO2 reading due to poor peripheral circulation. We attempted to get spO2 via forehead, earlobe and by warming up Pt's hands with hot packs and all attempts were unsuccessful. Pt is on 5 LPM via [place] and I did not remove or change.   Medications administered as noted. Pt refuses delsym  and robitussin liquid meds due to healing burns inside her mouth that are sensitive and painful when she takes them. Pt used lidocaine  oral medication to help with pain in mouth.  IV care completed including new curos cap.  Labs drawn.  Virtual visit with Dr. Sherre and Darice, RN completed.

## 2024-11-13 NOTE — Assessment & Plan Note (Addendum)
 Patient is status post furosemide  and is currently euvolemic

## 2024-11-13 NOTE — Assessment & Plan Note (Signed)
 Continue oxygen  supplementation Continue Brovana  nebulizer 15 mcg twice daily, albuterol  nebulizer 4 times daily, and Pulmicort  nebulizer twice daily Yupelri  nebulizer 175 mcg daily  12/20: Yupelri  nebulizer prescribed on discharge. Trelegy 200 mcg inhaler prescribed on discharged.

## 2024-11-13 NOTE — Plan of Care (Signed)
 Patient alert & oriented, Reports sleeping well took all medications well tolerated. Did not take Robitusson c/o mouth burning. Informed patient we can use lidocaine  mouth wash first then dispense robitussin if she feel she may need this evening for cough. Reports has not had additional bouts of diarr  Problem: Education: Goal: Knowledge of General Education information will improve Description: Including pain rating scale, medication(s)/side effects and non-pharmacologic comfort measures Outcome: Progressing   Problem: Clinical Measurements: Goal: Ability to maintain clinical measurements within normal limits will improve Outcome: Progressing Goal: Will remain free from infection Outcome: Progressing Goal: Diagnostic test results will improve Outcome: Progressing Goal: Respiratory complications will improve Outcome: Progressing   Problem: Activity: Goal: Risk for activity intolerance will decrease Outcome: Progressing   Problem: Nutrition: Goal: Adequate nutrition will be maintained Outcome: Progressing   Problem: Coping: Goal: Level of anxiety will decrease Outcome: Progressing   Problem: Elimination: Goal: Will not experience complications related to bowel motility Outcome: Progressing Note: Patient taking immodium to control diarrhea.   Problem: Pain Managment: Goal: General experience of comfort will improve and/or be controlled Outcome: Progressing   Problem: Skin Integrity: Goal: Risk for impaired skin integrity will decrease Outcome: Progressing

## 2024-11-13 NOTE — Assessment & Plan Note (Signed)
Simvastatin 20 mg nightly resumed

## 2024-11-13 NOTE — Progress Notes (Signed)
 Virtual rounding patient feeling well wanted to take pantoprazole  & simethicone  prior to evening meal eats at 1600. Patient identification completed and she self administered meds with assistance of grand daughter.

## 2024-11-13 NOTE — Progress Notes (Addendum)
 " PROGRESS NOTE - Telemedicine  Carol  RICHETTA [name]  FMW:[id number] DOB: 10-03-1936 DOA: 11/03/2024 PCP: Arloa Elsie SAUNDERS, MD   Video telemedicine encounter (via Zoom): at bedside, patient was able to tell me her first and last name and date of birth.   Carol [name] is an 88f with chronic combined CHF, HTN, HLD, GERD, mood disorder presented to the hospital with complaints of cough and confusion on 11/04/24.   Patient lives alone.  Since last 1 week has congestion and cold.  Progressively becoming confused since last was 7. Upon admission was found to have severe sepsis due to pneumonia. Receiving IV antibiotics.  11/04/2024: Patient admitted to [facility] for acute hypoxic respiratory failure presumed secondary to community-acquired pneumonia.  12/16: Patient completed 8 treatments of IV antibiotics for commune acquired pneumonia.  12/18: Patient transferred to hospital at 1 for monitoring of diarrhea, and resolution of symptoms with follow-up of GI and C. difficile panel.  12/19: I assumed care of the patient.  Patient reports she did not have diarrhea last evening.  She reports that she had diarrhea/stool that was watery this morning.  She would like to stay on service for 1 more day for GI function before getting discharge.  Anticipate discharge in 12/20 in the PM.  Assessment & Plan:   Principal Problem:   Diarrhea Active Problems:   Centrilobular emphysema (HCC)   HFrEF (heart failure with reduced ejection fraction) (HCC)   GERD (gastroesophageal reflux disease)   Anxiety   Essential hypertension   Protein-calorie malnutrition   Mixed hyperlipidemia   Poor peripheral circulation   Community acquired pneumonia   Assessment and Plan:  * Diarrhea GI panel and C. difficile screening by PCR were negative Symptomatic support Continue with loperamide  2 mg as needed for loose, diarrhea  HFrEF (heart failure with reduced ejection fraction) (HCC) Patient  is status post furosemide  and is currently euvolemic  Centrilobular emphysema (HCC) Continue oxygen  supplementation Continue Brovana  nebulizer 15 mcg twice daily, albuterol  nebulizer 4 times daily, and Pulmicort  nebulizer twice daily Yupelri  nebulizer 175 mcg daily  Poor peripheral circulation With difficulty obtaining spO2 with pulse oximetry Per medic, capillary refill of toe nails are < 2 sec Clinically at bedside, via telemedicine encounter, patient was able to speak in complete sentences without difficulty.  Patient did not appear to be in clinical respiratory distress.  Mixed hyperlipidemia Simvastatin  20 mg nightly resumed  Essential hypertension Metoprolol  succinate 50 mg p.o. every morning  Anxiety Home venlafaxine  150 mg daily with breakfast, mirtazapine  30 mg nightly were resumed on admission  GERD (gastroesophageal reflux disease) Home PPI were resumed  Community acquired pneumonia Patient completed 8 days of azithromycin  on 12/16 Patient completed complete course of ceftriaxone  2 g IV on 12/16  DVT prophylaxis: enoxparin subcutaneous q24 hrs Code Status: DNR, DNI Family Communication: granddaughter, Charmaine updated at bedside  Disposition Plan:  Level of care: Hospital at Home Med-Surg  Consultants:  PT, OT, TOC  Procedures:  None  Antimicrobials: 12/16: Patient completed course of for commune acquired pneumonia  Subjective:  At bedside, patient was able to confirm her first and last name, DOB, current calendar year, current location of: home.  Patient reports she slept very well last night.  She reports she slept much better last night than she did in the hospital.  She reports that she did not have to wake up to have a bowel movements last evening which was a blessing to her.  Patient reports she did have  1 episode of significant watery diarrhea this morning.  Objective: Vitals:   11/12/24 1425 11/12/24 1445 11/13/24 0900 11/13/24 1700  BP: 133/84   (!) 165/87 119/67  Pulse: (!) 104  (!) 121 90  Resp: (!) 26  20   Temp:   98.4 F (36.9 C) 98.3 F (36.8 C)  TempSrc: Oral  Oral Oral  SpO2: (!) 86% 92% (!) 83% 91%  Weight:      Height:       No intake or output data in the 24 hours ending 11/13/24 1809 Filed Weights   11/03/24 1155  Weight: 43.1 kg   Examination was completed with the assistance of: Lauraine Faes, charity fundraiser, who was present in the house during the virtual encounter:  General exam: Appears calm and comfortable  Respiratory system: Generalized diminished lung sounds. Respiratory effort normal. Cardiovascular system: S1 & S2 heard, RRR. No murmurs. No pedal edema. Gastrointestinal system: Abdomen is nondistended, soft and nontender. No organomegaly or masses felt. Normal bowel sounds heard. Central nervous system: Alert and oriented. No focal neurological deficits. Extremities: Symmetric 5 x 5 power. Skin: No rashes, lesions or ulcers Psychiatry: Judgement and insight appear normal. Mood & affect appropriate.   Data Reviewed: I have personally reviewed following labs and imaging studies  CBC: Recent Labs  Lab 11/07/24 0604 11/08/24 0703 11/09/24 0443 11/11/24 0855  WBC 10.1 14.1* 12.3* 14.1*  HGB 13.4 15.7* 14.3 14.3  HCT 41.2 46.1* 43.4 43.8  MCV 103.8* 102.4* 103.8* 104.0*  PLT 283 480* 343 322   Basic Metabolic Panel: Recent Labs  Lab 11/07/24 0604 11/08/24 0703 11/08/24 1912 11/09/24 0443 11/11/24 0855 11/13/24 1040  NA 137 137  --  137 141 140  K 5.0 6.0* 4.5 4.4 3.9 3.5  CL 103 100  --  102 104 99  CO2 23 27  --  25 25 27   GLUCOSE 97 102*  --  74 91 58*  BUN 35* 36*  --  31* 17 21  CREATININE 0.76 0.87  --  0.66 0.53 0.62  CALCIUM  8.9 9.9  --  9.4 9.2 9.2  MG 3.1* 2.6*  --  2.3  --   --    GFR: Estimated Creatinine Clearance: 34.3 mL/min (by C-G formula based on SCr of 0.62 mg/dL).  Liver Function Tests: Recent Labs  Lab 11/13/24 1040  AST 24  ALT 24  ALKPHOS 50  BILITOT 0.6   PROT 6.5  ALBUMIN 3.3*   No results for input(s): LIPASE, AMYLASE in the last 168 hours. No results for input(s): AMMONIA in the last 168 hours. Coagulation Profile: No results for input(s): INR, PROTIME in the last 168 hours. Cardiac Enzymes: No results for input(s): CKTOTAL, CKMB, CKMBINDEX, TROPONINI in the last 168 hours. BNP (last 3 results) Recent Labs    11/03/24 1622 11/11/24 0612  PROBNP 25,129.0* 11,644.0*   Recent Results (from the past 240 hours)  MRSA Next Gen by PCR, Nasal     Status: Abnormal   Collection Time: 11/09/24  1:05 PM   Specimen: Nasal Mucosa; Nasal Swab  Result Value Ref Range Status   MRSA by PCR Next Gen DETECTED (A) NOT DETECTED Final    Comment: (NOTE) The GeneXpert MRSA Assay (FDA approved for NASAL specimens only), is one component of a comprehensive MRSA colonization surveillance program. It is not intended to diagnose MRSA infection nor to guide or monitor treatment for MRSA infections. Test performance is not FDA approved in patients less than 63 years old.  Performed at [facility], [address] [place], [place] [address]   Respiratory (~20 pathogens) panel by PCR     Status: Abnormal   Collection Time: 11/09/24  1:05 PM   Specimen: Nasopharyngeal Swab; Respiratory  Result Value Ref Range Status   Adenovirus NOT DETECTED NOT DETECTED Final   Coronavirus 229E NOT DETECTED NOT DETECTED Final    Comment: (NOTE) The Coronavirus on the Respiratory Panel, DOES NOT test for the novel  Coronavirus (2019 nCoV)    Coronavirus HKU1 NOT DETECTED NOT DETECTED Final   Coronavirus NL63 NOT DETECTED NOT DETECTED Final   Coronavirus OC43 NOT DETECTED NOT DETECTED Final   Metapneumovirus NOT DETECTED NOT DETECTED Final   Rhinovirus / Enterovirus DETECTED (A) NOT DETECTED Final   Influenza A NOT DETECTED NOT DETECTED Final   Influenza B NOT DETECTED NOT DETECTED Final   Parainfluenza Virus 1 NOT DETECTED NOT  DETECTED Final   Parainfluenza Virus 2 NOT DETECTED NOT DETECTED Final   Parainfluenza Virus 3 NOT DETECTED NOT DETECTED Final   Parainfluenza Virus 4 NOT DETECTED NOT DETECTED Final   Respiratory Syncytial Virus NOT DETECTED NOT DETECTED Final   Bordetella pertussis NOT DETECTED NOT DETECTED Final   Bordetella Parapertussis NOT DETECTED NOT DETECTED Final   Chlamydophila pneumoniae NOT DETECTED NOT DETECTED Final   Mycoplasma pneumoniae NOT DETECTED NOT DETECTED Final    Comment: Performed at [facility], [address] [place], [place] [address]  Gastrointestinal Panel by PCR , Stool     Status: None   Collection Time: 11/11/24  6:20 PM   Specimen: STOOL  Result Value Ref Range Status   Campylobacter species NOT DETECTED NOT DETECTED Final   Plesimonas shigelloides NOT DETECTED NOT DETECTED Final   Salmonella species NOT DETECTED NOT DETECTED Final   Yersinia enterocolitica NOT DETECTED NOT DETECTED Final   Vibrio species NOT DETECTED NOT DETECTED Final   Vibrio cholerae NOT DETECTED NOT DETECTED Final   Enteroaggregative E coli (EAEC) NOT DETECTED NOT DETECTED Final   Enteropathogenic E coli (EPEC) NOT DETECTED NOT DETECTED Final   Enterotoxigenic E coli (ETEC) NOT DETECTED NOT DETECTED Final   Shiga like toxin producing E coli (STEC) NOT DETECTED NOT DETECTED Final   Shigella/Enteroinvasive E coli (EIEC) NOT DETECTED NOT DETECTED Final   Cryptosporidium NOT DETECTED NOT DETECTED Final   Cyclospora cayetanensis NOT DETECTED NOT DETECTED Final   Entamoeba histolytica NOT DETECTED NOT DETECTED Final   Giardia lamblia NOT DETECTED NOT DETECTED Final   Adenovirus F40/41 NOT DETECTED NOT DETECTED Final   Astrovirus NOT DETECTED NOT DETECTED Final   Norovirus GI/GII NOT DETECTED NOT DETECTED Final   Rotavirus A NOT DETECTED NOT DETECTED Final   Sapovirus (I, II, IV, and V) NOT DETECTED NOT DETECTED Final    Comment: Performed at [facility], [address]  [place], [place] [address]  C Difficile Quick Screen (NO PCR Reflex)     Status: None   Collection Time: 11/11/24  6:20 PM   Specimen: STOOL  Result Value Ref Range Status   C Diff antigen NEGATIVE NEGATIVE Final   C Diff toxin NEGATIVE NEGATIVE Final   C Diff interpretation No C. difficile detected.  Final    Comment: Performed at [facility], [address] [place], [place] [address]    Scheduled Meds:  albuterol   2.5 mg Nebulization BID   arformoterol   15 mcg Nebulization BID   budesonide  (PULMICORT ) nebulizer solution  0.25  mg Nebulization BID   dextromethorphan   30 mg Oral BID   empagliflozin   10 mg Oral Daily   enoxaparin  (LOVENOX ) injection  30 mg Subcutaneous Q24H   Gerhardt's butt cream   Topical BID   guaiFENesin   15 mL Oral QID   ivabradine   5 mg Oral BID WC   metoprolol  succinate  50 mg Oral q morning   mirtazapine   30 mg Oral QHS   montelukast   10 mg Oral Daily   multivitamin with minerals  1 tablet Oral Daily   mupirocin  ointment  1 Application Nasal BID   pantoprazole   40 mg Oral BID   raloxifene   60 mg Oral Daily   revefenacin   175 mcg Nebulization Daily   simethicone   80 mg Oral QID   simvastatin   20 mg Oral QHS   venlafaxine  XR  150 mg Oral Q breakfast    LOS: 10 days   Time spent: 50 minutes  Dr. Sherre Location: [place]  [facility] If 7PM-7AM, please contact night-coverage 11/13/2024, 6:09 PM   "

## 2024-11-13 NOTE — Progress Notes (Signed)
 Patient had bout of diarrhea today provider informed discussed increasing imodium  dose.   Patient discussed D/C with provider and they agreed on one more day of observation.

## 2024-11-13 NOTE — Assessment & Plan Note (Signed)
 GI panel and C. difficile screening by PCR were negative Symptomatic support Continue with loperamide  2 mg as needed for loose, diarrhea

## 2024-11-13 NOTE — Progress Notes (Signed)
 1857-- Outbound Call-Caregiver/granddaughter. RN introduction, Patient identifiers completed. Plan for night medications and overnight monitoring. Alarm Tachypnea for Respiratory Rate and potential Hypoxia for SpO2 alerts addressed. Caregiver/granddaughter confirmed reading was inaccurate, and the patient is currently asymptomatic at the time of the contact call. Caregiver/Granddaughter agreed to use a pulse oximeter and call if the patient is symptomatic overnight.      1917--Inbound call received from caregiver/granddaughter with request for medication administration assistance for patient. Caregiver/granddaughter reported that the patient is requesting HS medication early.      1918--Night APP notified of overnight monitoring plan for current health respiratory readings.      1931--Video Call --   RN introduction to patient completed. Patient sitting in the chair. Patient A&O with caregiver/granddaughter present. [place] intact.  Medication administration and assessment completed. Patient and caregiver denied the need for support with questions. No respiratory concerns or noted respiratory distress identified. Caregiver/granddaughter encouraged to call or utilize the tablet help button to support concerns.

## 2024-11-13 NOTE — Assessment & Plan Note (Addendum)
 With difficulty obtaining spO2 with pulse oximetry Per medic, capillary refill of toe nails are < 2 sec Clinically at bedside, via telemedicine encounter, patient was able to speak in complete sentences without difficulty.  Patient did not appear to be in clinical respiratory distress.

## 2024-11-13 NOTE — Progress Notes (Signed)
 Physical Therapy Treatment Patient Details Name: Carol  AVILYN [name] MRN: [id number] DOB: 04/15/1936 Today's Date: 11/13/2024   History of Present Illness Pt is a 88 y.o. female who presented due to cough and confusion. Pt was admitted due to hypoxic respiratory failure and community PNA. PMH combined systolic and diastolic congestive heart failure, hypertension, GERD    PT Comments  Virtual follow-up visit completed via video chat - patient and family member present. Mobilizing well, using 5L supplemental O2 but having difficulty obtaining pleth due to poor finger perfusion according to notes. Pt denies dyspnea when ambulating household distance but did become moderately dyspnea and anxious last night getting in/out of her bath tub with granddaughter assisting. Educated on energy conservation techniques. Currently decline shower seat but are going to look into having a hand rail installed. We reviewed LE exercises and a digital copy was sent to family member Carol [name]. Encouraged continued mobility at home as tolerated with family and staff assist as needed. All questions answered. Will continue to follow and be available acutely. HHPT recommendation upon d/c.     If plan is discharge home, recommend the following: A little help with walking and/or transfers;Assistance with cooking/housework;Assist for transportation;Help with stairs or ramp for entrance;A little help with bathing/dressing/bathroom   Can travel by private vehicle        Equipment Recommendations  None recommended by PT    Recommendations for Other Services       Precautions / Restrictions Precautions Precautions: Fall Recall of Precautions/Restrictions: Intact Precaution/Restrictions Comments: monitor O2 Restrictions Weight Bearing Restrictions Per Provider Order: No Other Position/Activity Restrictions: monitor O2     Mobility  Bed Mobility                    Transfers                         Ambulation/Gait                   Stairs             Wheelchair Mobility     Tilt Bed    Modified Rankin (Stroke Patients Only)       Balance                                            Communication Communication Communication: Impaired Factors Affecting Communication: Hearing impaired  Cognition Arousal: Alert Behavior During Therapy: WFL for tasks assessed/performed   PT - Cognitive impairments: No apparent impairments                         Following commands: Intact      Cueing Cueing Techniques: Verbal cues  Exercises Other Exercises Other Exercises: These were verbally reviewed with patient and family - verbalized understanding and will review digital handout. Instructed to reach out for any questions. Access Code: [email or web address]  URL: [email or web address]  Date: 11/13/2024  Prepared by: Leontine Roads    Exercises  - Seated Ankle Pumps  - 3 x daily - 7 x weekly - 1 sets - 10 reps  - Seated Long Arc Quad  - 2 x daily - 7 x weekly - 1 sets - 10 reps  - Seated Gluteal Sets  - 2 x daily - 7 x weekly - 1 sets - 10 reps  -  Sit to Stand with Counter Support  - 2 x daily - 7 x weekly - 1 sets - 5 reps    General Comments General comments (skin integrity, edema, etc.): Accompanied by family via video call virtual visit. Still ambulating throughout house often, using 5L supplemental O2. One episode of dyspnea last night when getting in/out of her tub with assistance from granddaughter. Poor pleth reported making cnosistent readings difficult by staff.      Pertinent Vitals/Pain Pain Assessment Pain Assessment: No/denies pain    Home Living                          Prior Function            PT Goals (current goals can now be found in the care plan section) Acute Rehab PT Goals Patient Stated Goal: get back home PT Goal Formulation: With patient Time For Goal Achievement: 11/19/24 Potential to Achieve  Goals: Good Progress towards PT goals: Progressing toward goals    Frequency    Min 2X/week      PT Plan      Co-evaluation              AM-PAC PT 6 Clicks Mobility   Outcome Measure  Help needed turning from your back to your side while in a flat bed without using bedrails?: None Help needed moving from lying on your back to sitting on the side of a flat bed without using bedrails?: None Help needed moving to and from a bed to a chair (including a wheelchair)?: A Little Help needed standing up from a chair using your arms (e.g., wheelchair or bedside chair)?: A Little Help needed to walk in hospital room?: A Little Help needed climbing 3-5 steps with a railing? : A Lot 6 Click Score: 19    End of Session Equipment Utilized During Treatment: Oxygen  Activity Tolerance: Patient tolerated treatment well Patient left: in chair (Dining room with family, seated)   PT Visit Diagnosis: Difficulty in walking, not elsewhere classified (R26.2);Muscle weakness (generalized) (M62.81)     Time: 8564-8555 PT Time Calculation (min) (ACUTE ONLY): 9 min  Charges:    $Therapeutic Exercise: 8-22 mins PT General Charges $$ ACUTE PT VISIT: 1 Visit                     Leontine Roads, PT, DPT [facility]  Rehabilitation Services Physical Therapist Office: [phone] Website: [email or web address]    [name] 11/13/2024, 3:04 PM

## 2024-11-13 NOTE — Progress Notes (Signed)
 Dual check with medic for Jardiance  10mg  and Corlanor  5mg .

## 2024-11-13 NOTE — Assessment & Plan Note (Signed)
 Home PPI were resumed

## 2024-11-13 NOTE — Assessment & Plan Note (Signed)
 Patient completed 8 days of azithromycin  on 12/16 Patient completed complete course of ceftriaxone  2 g IV on 12/16

## 2024-11-14 ENCOUNTER — Other Ambulatory Visit (HOSPITAL_COMMUNITY): Payer: Self-pay

## 2024-11-14 MED ORDER — REVEFENACIN 175 MCG/3ML IN SOLN
175.0000 ug | Freq: Every day | RESPIRATORY_TRACT | 2 refills | Status: AC
  Filled 2024-11-14: qty 90, 30d supply, fill #0

## 2024-11-14 MED ORDER — SIMETHICONE 80 MG PO CHEW
80.0000 mg | CHEWABLE_TABLET | Freq: Four times a day (QID) | ORAL | 0 refills | Status: AC
  Filled 2024-11-14: qty 30, 7d supply, fill #0

## 2024-11-14 MED ORDER — BENZONATATE 100 MG PO CAPS
100.0000 mg | ORAL_CAPSULE | Freq: Three times a day (TID) | ORAL | 0 refills | Status: AC | PRN
  Filled 2024-11-14: qty 20, 6d supply, fill #0

## 2024-11-14 MED ORDER — GERHARDT'S BUTT CREAM: 1.0000 | TOPICAL_CREAM | Freq: Two times a day (BID) | CUTANEOUS | 0 refills | Status: AC

## 2024-11-14 MED ORDER — IPRATROPIUM-ALBUTEROL 0.5-2.5 (3) MG/3ML IN SOLN
3.0000 mL | RESPIRATORY_TRACT | 2 refills | Status: AC | PRN
  Filled 2024-11-14: qty 180, 10d supply, fill #0

## 2024-11-14 MED ORDER — TRELEGY ELLIPTA 200-62.5-25 MCG/ACT IN AEPB
1.0000 | INHALATION_SPRAY | Freq: Every day | RESPIRATORY_TRACT | 1 refills | Status: AC
  Filled 2024-11-14: qty 60, 30d supply, fill #0

## 2024-11-14 MED ORDER — LOPERAMIDE HCL 2 MG PO CAPS
2.0000 mg | ORAL_CAPSULE | Freq: Four times a day (QID) | ORAL | 0 refills | Status: AC | PRN
  Filled 2024-11-14: qty 30, 7d supply, fill #0

## 2024-11-14 NOTE — TOC Transition Note (Signed)
 Transition of Care [facility]) - Discharge Note   Patient Details  Name: Carol [name]  TYREA FROBERG MRN: [id number] Date of Birth: [birth year]  [facility] CM/SW Contact:  Corean JAYSON Canary, RN Phone Number: 11/14/2024, 8:14 AM   Clinical Narrative:    The patient will discharge today with [facility] for home health. Oxygen  from [facility] is at  home from previous No further needs identified   Final next level of care: Home w Home Health Services Barriers to Discharge: No Barriers Identified   Patient Goals and CMS Choice Patient states their goals for this hospitalization and ongoing recovery are:: home CMS Medicare.gov Compare Post Acute Care list provided to:: Patient Represenative (must comment) (granddaughter Lavelle Berland)   [place] ownership interest in [facility].provided to:: Adult Children    Discharge Placement                       Discharge Plan and Services Additional resources added to the After Visit Summary for     Discharge Planning Services: CM Consult                      HH Arranged: [facility] Agency: [facility] Date HH Agency Contacted: 11/05/24 Time HH Agency Contacted: 1709 Representative spoke with at [facility] Agency: [place]  Social Drivers of Health (SDOH) Interventions SDOH Screenings   Food Insecurity: No Food Insecurity (11/05/2024)  Housing: Low Risk (11/05/2024)  Transportation Needs: No Transportation Needs (11/05/2024)  Utilities: Not At Risk (11/05/2024)  Tobacco Use: Medium Risk (11/03/2024)     Readmission Risk Interventions    11/05/2024    4:38 PM 11/04/2024    8:43 AM  Readmission Risk Prevention Plan  Transportation Screening Complete Complete  PCP or Specialist Appt within 5-7 Days Complete Complete  Home Care Screening Complete Complete  Medication Review (RN CM) Complete Complete

## 2024-11-14 NOTE — Discharge Summary (Addendum)
 " Physician Discharge Summary   Patient: Carol [name] MRN: [id number] DOB: Oct 06, 1936  Admit date:     11/03/2024  Discharge date: 11/14/2024  Discharge Physician: Dr. Sherre   PCP: Arloa Elsie SAUNDERS, MD   Video telemedicine encounter (via Zoom): at bedside, patient was able to tell me her first and last name and date of birth.   Recommendations at discharge:   1. Trelegy Ellipta  200 mcg daily prescribed on discharged. 2. Yupleri nebulizer daily prescribed on discharge. Patient and Granddaughter has been advised this medication is expensive. 3. Duoneb q4h as needed (DME nebulizer machine) prescribed on discharge 4. Please followup with your pulmonolgist within 4 weeks of discharge. Granddaughter confirms that Carol [name] has an appointment with pulmonologist, Dr. Jude at 12/02/2024. 5. Please followup with your PCP within 2 weeks of discharge. Granddaughter states they will call PCP for a followup appointment set up. She was given phone to IM residency for followup appointment should pcp not have available before 4 weeks after discharge.  Discharge Diagnoses:  Principal Problem:   Diarrhea Active Problems:   Centrilobular emphysema (HCC)   HFrEF (heart failure with reduced ejection fraction) (HCC)   GERD (gastroesophageal reflux disease)   Anxiety   Essential hypertension   Protein-calorie malnutrition   Mixed hyperlipidemia   Poor peripheral circulation   Community acquired pneumonia  Resolved Problems:   * No resolved hospital problems. [facility] Course: Ms. Carol [name] is an 88f with chronic combined CHF, HTN, HLD, GERD, mood disorder presented to the hospital with complaints of cough and confusion on 11/04/24.   Patient lives alone.  Since last 1 week has congestion and cold.  Progressively becoming confused since last was 7. Upon admission was found to have severe sepsis due to pneumonia. Receiving IV antibiotics.  11/04/2024: Patient admitted to [facility]  hospitalist service for acute hypoxic respiratory failure presumed secondary to community-acquired pneumonia.  12/16: Patient completed 8 treatments of IV antibiotics for commune acquired pneumonia.  12/18: Patient transferred to hospital at 1 for monitoring of diarrhea, and resolution of symptoms with follow-up of GI and C. difficile panel.  12/19: I assumed care of the patient.  Patient reports she did not have diarrhea last evening.  She reports that she had diarrhea/stool that was watery this morning.  She would like to stay on service for 1 more day for GI function before getting discharge.  Anticipate discharge in 12/20 in the PM.  Assessment and Plan:  * Diarrhea GI panel and C. difficile screening by PCR were negative Symptomatic support Continue with loperamide  2 mg as needed for loose, diarrhea  HFrEF (heart failure with reduced ejection fraction) (HCC) Patient is status post furosemide  and is currently euvolemic  Centrilobular emphysema (HCC) Continue oxygen  supplementation Continue Brovana  nebulizer 15 mcg twice daily, albuterol  nebulizer 4 times daily, and Pulmicort  nebulizer twice daily Yupelri  nebulizer 175 mcg daily  Poor peripheral circulation With difficulty obtaining spO2 with pulse oximetry Per medic, capillary refill of toe nails are < 2 sec Clinically at bedside, via telemedicine encounter, patient was able to speak in complete sentences without difficulty.  Patient did not appear to be in clinical respiratory distress.  Mixed hyperlipidemia Simvastatin  20 mg nightly resumed  Essential hypertension Metoprolol  succinate 50 mg p.o. every morning  Anxiety Home venlafaxine  150 mg daily with breakfast, mirtazapine  30 mg nightly were resumed on admission  GERD (gastroesophageal reflux disease) Home PPI were resumed  Community acquired pneumonia Patient completed 8 days of azithromycin   on 12/16 Patient completed complete course of ceftriaxone  2 g IV on  12/16      Pain control -   Controlled Substance Reporting System database was reviewed. and patient was instructed, not to drive, operate heavy machinery, perform activities at heights, swimming or participation in water activities or provide baby-sitting services while on Pain, Sleep and Anxiety Medications; until their outpatient Physician has advised to do so again. Also recommended to not to take more than prescribed Pain, Sleep and Anxiety Medications.  Consultants: PT, OT, TOC Procedures performed: none  Disposition: Home Diet recommendation:  Cardiac diet DISCHARGE MEDICATION: Allergies as of 11/14/2024       Reactions   Amoxicillin  Other (See Comments)   Reaction not noted   Amoxicillin -pot Clavulanate Other (See Comments)   Reaction not noted   Ciprofloxacin  Nausea Only, Other (See Comments)   Nausea and legs became weak after taking   Metronidazole  Other (See Comments)   Reaction not noted   Nitrofuran Derivatives Nausea Only, Other (See Comments)   Bad nausea, legs weak   Paroxetine Nausea Only, Other (See Comments)   Sick on stomach   Penicillins Nausea Only, Other (See Comments)   Sick on stomach   Sulfa Antibiotics Anxiety, Other (See Comments)   Jittery   Sulfamethoxazole Anxiety, Other (See Comments)   Jittery        Medication List     STOP taking these medications    OXYGEN        TAKE these medications    aspirin  81 MG chewable tablet Chew 1 tablet (81 mg total) by mouth daily with lunch.   benzonatate  100 MG capsule Commonly known as: TESSALON  Take 1 capsule (100 mg total) by mouth 3 (three) times daily as needed for cough.   chlorpheniramine 4 MG tablet Commonly known as: CHLOR-TRIMETON Take 4 mg by mouth daily as needed for allergies or rhinitis.   empagliflozin  10 MG Tabs tablet Commonly known as: Jardiance  Take 1 tablet (10 mg total) by mouth daily.   famotidine  10 MG tablet Commonly known as: PEPCID  Take  20 mg by mouth daily as needed for heartburn or indigestion.   Gerhardt's butt cream Crea Apply 1 Application topically 2 (two) times daily.   ipratropium 0.03 % nasal spray Commonly known as: ATROVENT  Place 2 sprays into both nostrils 3 (three) times daily. What changed:  when to take this reasons to take this   ipratropium-albuterol  0.5-2.5 (3) MG/3ML Soln Commonly known as: DUONEB Take 3 mLs by nebulization every 4 (four) hours as needed.   ivabradine  5 MG Tabs tablet Commonly known as: CORLANOR  TAKE 1 TABLET TWICE A DAY WITH MEALS   loperamide  2 MG capsule Commonly known as: IMODIUM  Take 1 capsule (2 mg total) by mouth every 6 (six) hours as needed for diarrhea or loose stools.   loratadine 10 MG tablet Commonly known as: CLARITIN Take 10 mg by mouth daily.   LORazepam  1 MG tablet Commonly known as: ATIVAN  Take 1 tablet (1 mg total) by mouth daily at 8 pm. What changed: when to take this   losartan  25 MG tablet Commonly known as: COZAAR  TAKE 1 TABLET EVERY EVENING   metoprolol  succinate 50 MG 24 hr tablet Commonly known as: TOPROL -XL TAKE 1 TABLET IN THE MORNING   mirtazapine  30 MG tablet Commonly known as: REMERON  Take 30 mg by mouth at bedtime.   montelukast  10 MG tablet Commonly known as: SINGULAIR  Take 10 mg by mouth at bedtime as needed (for allergies).  Multi Vitamin/Minerals Tabs Take 1 tablet by mouth daily with breakfast.   Nasacort Allergy 24HR 55 MCG/ACT Aero nasal inhaler Generic drug: triamcinolone Place 2 sprays into the nose in the morning.   pantoprazole  40 MG tablet Commonly known as: PROTONIX  Take 40 mg by mouth 2 (two) times daily before a meal.   polyethylene glycol 17 g packet Commonly known as: MIRALAX  / GLYCOLAX  Take 17 g by mouth daily. What changed:  how much to take when to take this   raloxifene  60 MG tablet Commonly known as: EVISTA  Take 60 mg by mouth daily.   revefenacin  175 MCG/3ML nebulizer solution Commonly  known as: YUPELRI  Take 3 mLs (175 mcg total) by nebulization daily.   simethicone  80 MG chewable tablet Commonly known as: MYLICON Chew 1 tablet (80 mg total) by mouth 4 (four) times daily.   simvastatin  20 MG tablet Commonly known as: ZOCOR  TAKE 1 TABLET AT BEDTIME   sodium chloride  0.65 % Soln nasal spray Commonly known as: OCEAN Place 1 spray into both nostrils as needed for congestion.   spironolactone  25 MG tablet Commonly known as: ALDACTONE  Take 0.5 tablets (12.5 mg total) by mouth daily.   SYSTANE OP Place 1 drop into both eyes 3 (three) times daily as needed (for dryness).   Trelegy Ellipta  100-62.5-25 MCG/ACT Aepb Generic drug: Fluticasone -Umeclidin-Vilant Inhale 1 puff into the lungs daily. What changed: Another medication with the same name was added. Make sure you understand how and when to take each.   Trelegy Ellipta  200-62.5-25 MCG/ACT Aepb Generic drug: Fluticasone -Umeclidin-Vilant Inhale 1 puff into the lungs daily at 6 (six) AM. What changed: You were already taking a medication with the same name, and this prescription was added. Make sure you understand how and when to take each.   triamcinolone ointment 0.5 % Commonly known as: KENALOG Apply 1 Application topically 2 (two) times daily as needed (for itching).   Tylenol  8 Hour Arthritis Pain 650 MG CR tablet Generic drug: acetaminophen  Take 650-1,300 mg by mouth every 8 (eight) hours as needed for pain.   venlafaxine  XR 150 MG 24 hr capsule Commonly known as: EFFEXOR -XR Take 150 mg by mouth daily with breakfast.               Durable Medical Equipment  (From admission, onward)           Start     Ordered   11/14/24 0815  For home use only DME Nebulizer machine  Once       Question Answer Comment  Patient needs a nebulizer to treat with the following condition COPD exacerbation (HCC)   Patient needs a nebulizer to treat with the following condition Bronchitis   Patient needs a  nebulizer to treat with the following condition Emphysema of lung (HCC)   Length of Need Lifetime   Additional equipment included Administration kit   Additional equipment included Filter      11/14/24 0815   11/11/24 1324  For home use only DME oxygen   Once       Question Answer Comment  Length of Need Lifetime   Mode or (Route) Nasal cannula   Liters per Minute 4   Frequency Continuous (stationary and portable oxygen  unit needed)   Oxygen  delivery system: Gas      11/11/24 1323   11/05/24 1705  For home use only DME Walker rolling  Once       Question Answer Comment  Walker: With 5 Inch Wheels   Patient  needs a walker to treat with the following condition Walker as ambulation aid      11/05/24 1704            Contact information for follow-up providers     [facility] Follow up.   Contact information: [address] JEWELL DELENA ODESSIA KATHEE [place] [address] [phone]              Contact information for after-discharge care     Home Medical Care     [facility]) .   Service: Home Health Services Contact information: [address] [place]  [address] [phone]                    Discharge Exam: Carol [name]   11/03/24 1155 11/14/24 0900  Weight: 43.1 kg 41.8 kg   Physical exam was completed with the assistance of Lauraine Faes, charity fundraiser, who was present in the home at the time the telemedicine encounter.  Physical Exam Vitals and nursing note reviewed.  Constitutional:      Appearance: Normal appearance. She is normal weight.  HENT:     Head: Normocephalic and atraumatic.     Right Ear: External ear normal.     Left Ear: External ear normal.  Eyes:     Extraocular Movements: Extraocular movements intact.     Conjunctiva/sclera: Conjunctivae normal.  Neck:     Comments: And midline Cardiovascular:     Rate and Rhythm: Normal rate and regular rhythm.     Pulses: Normal  pulses.     Heart sounds: Normal heart sounds. No murmur heard. Pulmonary:     Effort: Pulmonary effort is normal. No respiratory distress.     Breath sounds: Normal breath sounds.  Abdominal:     General: Bowel sounds are normal.     Palpations: Abdomen is soft.  Musculoskeletal:        General: No swelling. Normal range of motion.     Cervical back: Normal range of motion.  Skin:    Capillary Refill: Capillary refill takes less than 2 seconds.     Coloration: Skin is not jaundiced.  Neurological:     Mental Status: She is alert and oriented to person, place, and time. Mental status is at baseline.  Psychiatric:        Mood and Affect: Mood normal.        Behavior: Behavior normal.   Condition at discharge: good  The results of significant diagnostics from this hospitalization (including imaging, microbiology, ancillary and laboratory) are listed below for reference.   Imaging Studies: DG Chest 2 View Result Date: 11/06/2024 EXAM: 2 VIEW(S) XRAY OF THE CHEST 11/06/2024 10:29:00 AM COMPARISON: 11/03/2024 CLINICAL HISTORY: Pneumonia FINDINGS: LUNGS AND PLEURA: Multifocal patchy opacities, right lower lung predominant, similar to the prior and favoring multifocal pneumonia. Nodular/patchy opacity at the left lung apex is presumed to be infectious. Small right pleural effusion. No pneumothorax. HEART AND MEDIASTINUM: Aortic atherosclerosis. BONES AND SOFT TISSUES: No acute osseous abnormality. IMPRESSION: 1. Stable multifocal pneumonia. 2. Small right pleural effusion. Electronically signed by: Pinkie Pebbles MD 11/06/2024 08:38 PM EST RP Workstation: [id number]   CT ABDOMEN PELVIS W CONTRAST Result Date: 11/03/2024 CLINICAL DATA:  Abdominal pain, vomiting, weakness, left lower abdominal tenderness EXAM: CT ABDOMEN AND PELVIS WITH CONTRAST TECHNIQUE: Multidetector CT imaging of the abdomen and pelvis was performed using the standard protocol following bolus administration of intravenous  contrast. RADIATION DOSE  REDUCTION: This exam was performed according to the departmental dose-optimization program which includes automated exposure control, adjustment of the mA and/or kV according to patient size and/or use of iterative reconstruction technique. CONTRAST:  60mL OMNIPAQUE  IOHEXOL  300 MG/ML  SOLN COMPARISON:  11/03/2024, 04/27/2022 FINDINGS: Lower chest: Airspace disease throughout the right middle and right lower lobes consistent with infection or aspiration. Background emphysema. Hepatobiliary: No focal liver abnormality is seen. No gallstones, gallbladder wall thickening, or biliary dilatation. Pancreas: Unremarkable. No pancreatic ductal dilatation or surrounding inflammatory changes. Spleen: Normal in size without focal abnormality. Adrenals/Urinary Tract: Nonspecific bilateral adrenal thickening measuring 9 mm on the right and 8 mm on the left. The kidneys enhance normally and symmetrically. No urinary tract calculi or obstructive uropathy. Bladder is unremarkable. Stomach/Bowel: No bowel obstruction or ileus. Normal appendix right lower quadrant. Diffuse diverticulosis most pronounced within the sigmoid colon, with no evidence of acute diverticulitis. No bowel wall thickening or inflammatory change. There is a persistent 2.4 cm hyperdense structure within the distal sigmoid colon, reference image 57/2, which may reflect a fecalith or barium filled diverticulum. Vascular/Lymphatic: Diffuse atherosclerosis of the aorta and its branches. No pathologic adenopathy. Reproductive: Uterus and bilateral adnexa are unremarkable. Other: No free fluid or free intraperitoneal gas. No abdominal wall hernia. Musculoskeletal: No acute or destructive bony abnormalities. The bones are osteopenic. Stable degenerative anterolisthesis of L4 on L5. Reconstructed images demonstrate no additional findings. IMPRESSION: 1. Right middle and right lower lobe pneumonia. 2. No acute intra-abdominal or intrapelvic  process. 3. Colonic diverticulosis without diverticulitis. 4. Aortic Atherosclerosis (ICD10-I70.0) and Emphysema (ICD10-J43.9). Electronically Signed   By: Ozell Daring M.D.   On: 11/03/2024 15:36   DG Chest Port 1 View Result Date: 11/03/2024 CLINICAL DATA:  Weakness.  Cough. EXAM: PORTABLE CHEST 1 VIEW COMPARISON:  Chest radiograph dated 09/06/2022. CT chest dated 02/12/2022. FINDINGS: Mildly prominent cardiac silhouette. Aortic atherosclerosis. Small layering right pleural effusion with right basilar opacity. Background chronic interstitial coarsening, compatible with history of emphysema. No pneumothorax. No acute osseous abnormality. IMPRESSION: 1. Small layering right pleural effusion with right basilar airspace disease. 2. Emphysema. Electronically Signed   By: Harrietta Sherry M.D.   On: 11/03/2024 14:11   Microbiology: Results for orders placed or performed during the hospital encounter of 11/03/24  Resp panel by RT-PCR (RSV, Flu A&B, Covid) Anterior Nasal Swab     Status: None   Collection Time: 11/03/24  5:53 PM   Specimen: Anterior Nasal Swab  Result Value Ref Range Status   SARS Coronavirus 2 by RT PCR NEGATIVE NEGATIVE Final    Comment: (NOTE) SARS-CoV-2 target nucleic acids are NOT DETECTED.  The SARS-CoV-2 RNA is generally detectable in upper respiratory specimens during the acute phase of infection. The lowest concentration of SARS-CoV-2 viral copies this assay can detect is 138 copies/mL. A negative result does not preclude SARS-Cov-2 infection and should not be used as the sole basis for treatment or other patient management decisions. A negative result may occur with  improper specimen collection/handling, submission of specimen other than nasopharyngeal swab, presence of viral mutation(s) within the areas targeted by this assay, and inadequate number of viral copies(<138 copies/mL). A negative result must be combined with clinical observations, patient history, and  epidemiological information. The expected result is Negative.  Fact Sheet for Patients:  bloggercourse.com  Fact Sheet for Healthcare Providers:  seriousbroker.it  This test is no t yet approved or cleared by the [place]  FDA and  has been authorized for detection and/or  diagnosis of SARS-CoV-2 by FDA under an Emergency Use Authorization (EUA). This EUA will remain  in effect (meaning this test can be used) for the duration of the COVID-19 declaration under Section 564(b)(1) of the Act, 21 U.S.C.section 360bbb-3(b)(1), unless the authorization is terminated  or revoked sooner.       Influenza A by PCR NEGATIVE NEGATIVE Final   Influenza B by PCR NEGATIVE NEGATIVE Final    Comment: (NOTE) The Xpert Xpress SARS-CoV-2/FLU/RSV plus assay is intended as an aid in the diagnosis of influenza from Nasopharyngeal swab specimens and should not be used as a sole basis for treatment. Nasal washings and aspirates are unacceptable for Xpert Xpress SARS-CoV-2/FLU/RSV testing.  Fact Sheet for Patients: bloggercourse.com  Fact Sheet for Healthcare Providers: seriousbroker.it  This test is not yet approved or cleared by the [place]  FDA and has been authorized for detection and/or diagnosis of SARS-CoV-2 by FDA under an Emergency Use Authorization (EUA). This EUA will remain in effect (meaning this test can be used) for the duration of the COVID-19 declaration under Section 564(b)(1) of the Act, 21 U.S.C. section 360bbb-3(b)(1), unless the authorization is terminated or revoked.     Resp Syncytial Virus by PCR NEGATIVE NEGATIVE Final    Comment: (NOTE) Fact Sheet for Patients: bloggercourse.com  Fact Sheet for Healthcare Providers: seriousbroker.it  This test is not yet approved or cleared by the [place]  FDA and has been  authorized for detection and/or diagnosis of SARS-CoV-2 by FDA under an Emergency Use Authorization (EUA). This EUA will remain in effect (meaning this test can be used) for the duration of the COVID-19 declaration under Section 564(b)(1) of the Act, 21 U.S.C. section 360bbb-3(b)(1), unless the authorization is terminated or revoked.  Performed at [facility], [address] [place], [place] [address]   MRSA Next Gen by PCR, Nasal     Status: Abnormal   Collection Time: 11/09/24  1:05 PM   Specimen: Nasal Mucosa; Nasal Swab  Result Value Ref Range Status   MRSA by PCR Next Gen DETECTED (A) NOT DETECTED Final    Comment: (NOTE) The GeneXpert MRSA Assay (FDA approved for NASAL specimens only), is one component of a comprehensive MRSA colonization surveillance program. It is not intended to diagnose MRSA infection nor to guide or monitor treatment for MRSA infections. Test performance is not FDA approved in patients less than 25 years old. Performed at [facility], [address] [place], [place] [address]   Respiratory (~20 pathogens) panel by PCR     Status: Abnormal   Collection Time: 11/09/24  1:05 PM   Specimen: Nasopharyngeal Swab; Respiratory  Result Value Ref Range Status   Adenovirus NOT DETECTED NOT DETECTED Final   Coronavirus 229E NOT DETECTED NOT DETECTED Final    Comment: (NOTE) The Coronavirus on the Respiratory Panel, DOES NOT test for the novel  Coronavirus (2019 nCoV)    Coronavirus HKU1 NOT DETECTED NOT DETECTED Final   Coronavirus NL63 NOT DETECTED NOT DETECTED Final   Coronavirus OC43 NOT DETECTED NOT DETECTED Final   Metapneumovirus NOT DETECTED NOT DETECTED Final   Rhinovirus / Enterovirus DETECTED (A) NOT DETECTED Final   Influenza A NOT DETECTED NOT DETECTED Final   Influenza B NOT DETECTED NOT DETECTED Final   Parainfluenza Virus 1 NOT DETECTED NOT DETECTED Final   Parainfluenza Virus 2 NOT DETECTED NOT  DETECTED Final   Parainfluenza Virus 3 NOT DETECTED NOT DETECTED Final   Parainfluenza Virus 4 NOT DETECTED  NOT DETECTED Final   Respiratory Syncytial Virus NOT DETECTED NOT DETECTED Final   Bordetella pertussis NOT DETECTED NOT DETECTED Final   Bordetella Parapertussis NOT DETECTED NOT DETECTED Final   Chlamydophila pneumoniae NOT DETECTED NOT DETECTED Final   Mycoplasma pneumoniae NOT DETECTED NOT DETECTED Final    Comment: Performed at [facility], [address] [place], [place] [address]  Gastrointestinal Panel by PCR , Stool     Status: None   Collection Time: 11/11/24  6:20 PM   Specimen: STOOL  Result Value Ref Range Status   Campylobacter species NOT DETECTED NOT DETECTED Final   Plesimonas shigelloides NOT DETECTED NOT DETECTED Final   Salmonella species NOT DETECTED NOT DETECTED Final   Yersinia enterocolitica NOT DETECTED NOT DETECTED Final   Vibrio species NOT DETECTED NOT DETECTED Final   Vibrio cholerae NOT DETECTED NOT DETECTED Final   Enteroaggregative E coli (EAEC) NOT DETECTED NOT DETECTED Final   Enteropathogenic E coli (EPEC) NOT DETECTED NOT DETECTED Final   Enterotoxigenic E coli (ETEC) NOT DETECTED NOT DETECTED Final   Shiga like toxin producing E coli (STEC) NOT DETECTED NOT DETECTED Final   Shigella/Enteroinvasive E coli (EIEC) NOT DETECTED NOT DETECTED Final   Cryptosporidium NOT DETECTED NOT DETECTED Final   Cyclospora cayetanensis NOT DETECTED NOT DETECTED Final   Entamoeba histolytica NOT DETECTED NOT DETECTED Final   Giardia lamblia NOT DETECTED NOT DETECTED Final   Adenovirus F40/41 NOT DETECTED NOT DETECTED Final   Astrovirus NOT DETECTED NOT DETECTED Final   Norovirus GI/GII NOT DETECTED NOT DETECTED Final   Rotavirus A NOT DETECTED NOT DETECTED Final   Sapovirus (I, II, IV, and V) NOT DETECTED NOT DETECTED Final    Comment: Performed at [facility], [address] [place], [place] [address]  C Difficile Quick Screen (NO PCR  Reflex)     Status: None   Collection Time: 11/11/24  6:20 PM   Specimen: STOOL  Result Value Ref Range Status   C Diff antigen NEGATIVE NEGATIVE Final   C Diff toxin NEGATIVE NEGATIVE Final   C Diff interpretation No C. difficile detected.  Final    Comment: Performed at [facility], [address], [place], [place] [address]   Labs: CBC: Recent Labs  Lab 11/08/24 0703 11/09/24 0443 11/11/24 0855  WBC 14.1* 12.3* 14.1*  HGB 15.7* 14.3 14.3  HCT 46.1* 43.4 43.8  MCV 102.4* 103.8* 104.0*  PLT 480* 343 322   Basic Metabolic Panel: Recent Labs  Lab 11/08/24 0703 11/08/24 1912 11/09/24 0443 11/11/24 0855 11/13/24 1040  NA 137  --  137 141 140  K 6.0* 4.5 4.4 3.9 3.5  CL 100  --  102 104 99  CO2 27  --  25 25 27   GLUCOSE 102*  --  74 91 58*  BUN 36*  --  31* 17 21  CREATININE 0.87  --  0.66 0.53 0.62  CALCIUM  9.9  --  9.4 9.2 9.2  MG 2.6*  --  2.3  --   --    Liver Function Tests: Recent Labs  Lab 11/13/24 1040  AST 24  ALT 24  ALKPHOS 50  BILITOT 0.6  PROT 6.5  ALBUMIN 3.3*   CBG: No results for input(s): GLUCAP in the last 168 hours.  Discharge time spent: greater than 30 minutes.  Signed: Dr. Sherre [facility] Location: [place]  11/14/2024 "

## 2024-11-14 NOTE — Progress Notes (Signed)
 Dc reviewed with patient and her granddaughter Charmaine. Pt has appt with pulmonology MD on Jan 7th. She is to make an appt with primary and if unable to see primary within a month, was given the phone number to [facility] for hospital follow up visit. Meds were sent to [id number] [facility] in [place], granddaughter Diablo aware. Pt and granddaughter voiced understanding. All questions answered,encouraged to call back if any questions arise.

## 2024-11-14 NOTE — Plan of Care (Signed)
" °  Problem: Education: Goal: Knowledge of General Education information will improve Description: Including pain rating scale, medication(s)/side effects and non-pharmacologic comfort measures Outcome: Adequate for Discharge   Problem: Health Behavior/Discharge Planning: Goal: Ability to manage health-related needs will improve Outcome: Adequate for Discharge   Problem: Clinical Measurements: Goal: Ability to maintain clinical measurements within normal limits will improve Outcome: Adequate for Discharge Goal: Will remain free from infection Outcome: Adequate for Discharge Goal: Diagnostic test results will improve Outcome: Adequate for Discharge Goal: Respiratory complications will improve Outcome: Adequate for Discharge Goal: Cardiovascular complication will be avoided Outcome: Adequate for Discharge   Problem: Activity: Goal: Risk for activity intolerance will decrease Outcome: Adequate for Discharge   Problem: Nutrition: Goal: Adequate nutrition will be maintained Outcome: Adequate for Discharge   Problem: Coping: Goal: Level of anxiety will decrease Outcome: Adequate for Discharge   Problem: Elimination: Goal: Will not experience complications related to bowel motility Outcome: Adequate for Discharge Goal: Will not experience complications related to urinary retention Outcome: Adequate for Discharge   Problem: Pain Managment: Goal: General experience of comfort will improve and/or be controlled Outcome: Adequate for Discharge   Problem: Safety: Goal: Ability to remain free from injury will improve Outcome: Adequate for Discharge   Problem: Skin Integrity: Goal: Risk for impaired skin integrity will decrease Outcome: Adequate for Discharge   Problem: Clinical Measurements: Goal: Ability to maintain a body temperature in the normal range will improve Outcome: Adequate for Discharge   Problem: Respiratory: Goal: Ability to maintain adequate ventilation will  improve Outcome: Adequate for Discharge Goal: Ability to maintain a clear airway will improve Outcome: Adequate for Discharge   "

## 2024-11-14 NOTE — Progress Notes (Signed)
 Pt seen for [facility] morning visit/ discharge. Pt appears well and states she slept fairly well last night. Pt has already had breakfast. Pt's granddaughter is present.  Vital signs and assessment obtained as noted. Lung sounds reveal inspiratory wheezes and are clear on expiration. Pt has strong regular pulses and heart tones are s1, s2. No edema noted. Abdomen is soft and non-tender with active bowel sounds. Pt denies any more diarrhea.   Medications administered as noted.  Pt had virtual visit with RN Jon and Dr. Sherre to go over AVS and complete discharge.   IV removed as noted.   Pt and granddaughter understand instructions and will call if they have any questions or problems.

## 2024-11-14 NOTE — Plan of Care (Signed)
" °  Problem: Clinical Measurements: Goal: Ability to maintain clinical measurements within normal limits will improve Outcome: Progressing Goal: Will remain free from infection Outcome: Progressing Goal: Diagnostic test results will improve Outcome: Progressing Goal: Respiratory complications will improve Outcome: Progressing Goal: Cardiovascular complication will be avoided Outcome: Progressing   Problem: Nutrition: Goal: Adequate nutrition will be maintained Outcome: Progressing   Problem: Elimination: Goal: Will not experience complications related to bowel motility Outcome: Progressing Goal: Will not experience complications related to urinary retention Outcome: Progressing   Problem: Pain Managment: Goal: General experience of comfort will improve and/or be controlled Outcome: Progressing   Problem: Safety: Goal: Ability to remain free from injury will improve Outcome: Progressing   Problem: Skin Integrity: Goal: Risk for impaired skin integrity will decrease Outcome: Progressing   Problem: Respiratory: Goal: Ability to maintain adequate ventilation will improve Outcome: Progressing Goal: Ability to maintain a clear airway will improve Outcome: Progressing   "

## 2024-11-14 NOTE — Progress Notes (Signed)
 Completed virtual rounds with MD,paramedic at patient bedside. POC reviewed and discussed ,patient voices understanding and agreement. Pt reminded to call RN for any needs, RN and MD available at all times. Pt voices understanding. Pt aware of next planned visit and next call from RN.

## 2024-11-15 LAB — BLOOD GAS, ARTERIAL
Bicarbonate: 27.9 mmol/L — AB (ref 20.0–28.0)
Drawn by: 25770
Drawn by: 25770
O2 Content: 7
O2 Saturation: 97.3 % — AB (ref 0.0–2.0)
Patient temperature: 36.7
Patient temperature: 97.3 %
pH, Arterial: 44 mmHg (ref 7.35–7.45)
pH, Arterial: 7.4 (ref 7.35–7.45)
pO2, Arterial: 71 mmHg — AB (ref 83–28.0)
pO2, Arterial: 71 mmHg — AB (ref 83–48)

## 2024-12-01 ENCOUNTER — Other Ambulatory Visit: Payer: Self-pay | Admitting: Cardiology

## 2024-12-02 ENCOUNTER — Encounter (HOSPITAL_BASED_OUTPATIENT_CLINIC_OR_DEPARTMENT_OTHER): Payer: Self-pay

## 2024-12-02 ENCOUNTER — Ambulatory Visit (HOSPITAL_BASED_OUTPATIENT_CLINIC_OR_DEPARTMENT_OTHER)

## 2024-12-02 VITALS — BP 145/72 | HR 89 | Ht 60.0 in | Wt 94.0 lb

## 2024-12-02 DIAGNOSIS — J432 Centrilobular emphysema: Secondary | ICD-10-CM

## 2024-12-02 DIAGNOSIS — J9611 Chronic respiratory failure with hypoxia: Secondary | ICD-10-CM

## 2024-12-02 DIAGNOSIS — J918 Pleural effusion in other conditions classified elsewhere: Secondary | ICD-10-CM

## 2024-12-02 DIAGNOSIS — J189 Pneumonia, unspecified organism: Secondary | ICD-10-CM

## 2024-12-02 MED ORDER — MONTELUKAST SODIUM 10 MG PO TABS: 10.0000 mg | ORAL_TABLET | Freq: Every evening | ORAL | 3 refills | Status: AC | PRN
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
## 2024-12-02 NOTE — Patient Instructions (Signed)
 Continue Trelegy 100 once daily.  Use Albuterol  as needed.  Stop Yupelri .  Complete Chest XR follow up in March; follow up in clinic afterwards for results and to complete walk test.  Continue oxygen  at 3 liters per minute for now.

## 2025-01-25 ENCOUNTER — Ambulatory Visit (HOSPITAL_BASED_OUTPATIENT_CLINIC_OR_DEPARTMENT_OTHER)

## 2025-05-25 ENCOUNTER — Encounter (INDEPENDENT_AMBULATORY_CARE_PROVIDER_SITE_OTHER): Admitting: Ophthalmology
